# Patient Record
Sex: Female | Born: 1980 | Race: White | Hispanic: No | Smoking: Current every day smoker
Health system: Southern US, Community
[De-identification: ages and names within clinical notes are randomized; demographics above are authoritative.]

## PROBLEM LIST (undated history)

## (undated) DIAGNOSIS — F1010 Alcohol abuse, uncomplicated: Secondary | ICD-10-CM

## (undated) DIAGNOSIS — F32A Depression, unspecified: Secondary | ICD-10-CM

## (undated) DIAGNOSIS — D649 Anemia, unspecified: Secondary | ICD-10-CM

## (undated) DIAGNOSIS — D696 Thrombocytopenia, unspecified: Secondary | ICD-10-CM

## (undated) DIAGNOSIS — J40 Bronchitis, not specified as acute or chronic: Secondary | ICD-10-CM

## (undated) DIAGNOSIS — F329 Major depressive disorder, single episode, unspecified: Secondary | ICD-10-CM

## (undated) DIAGNOSIS — G43909 Migraine, unspecified, not intractable, without status migrainosus: Secondary | ICD-10-CM

## (undated) DIAGNOSIS — Z87442 Personal history of urinary calculi: Secondary | ICD-10-CM

## (undated) HISTORY — PX: CHOLECYSTECTOMY: SHX55

## (undated) HISTORY — PX: APPENDECTOMY: SHX54

## (undated) HISTORY — PX: TUBAL LIGATION: SHX77

---

## 2004-07-28 ENCOUNTER — Emergency Department: Payer: Self-pay | Admitting: Internal Medicine

## 2004-08-19 ENCOUNTER — Emergency Department: Payer: Self-pay | Admitting: Emergency Medicine

## 2005-10-24 ENCOUNTER — Emergency Department: Payer: Self-pay | Admitting: Emergency Medicine

## 2006-06-16 ENCOUNTER — Emergency Department: Payer: Self-pay | Admitting: Emergency Medicine

## 2006-06-16 ENCOUNTER — Other Ambulatory Visit: Payer: Self-pay

## 2007-03-12 ENCOUNTER — Emergency Department: Payer: Self-pay | Admitting: Emergency Medicine

## 2007-11-13 ENCOUNTER — Emergency Department: Payer: Self-pay | Admitting: Emergency Medicine

## 2007-11-27 ENCOUNTER — Observation Stay: Payer: Self-pay | Admitting: Urology

## 2008-04-28 ENCOUNTER — Emergency Department: Payer: Self-pay | Admitting: Unknown Physician Specialty

## 2008-06-28 ENCOUNTER — Emergency Department: Payer: Self-pay

## 2008-07-06 ENCOUNTER — Emergency Department: Payer: Self-pay | Admitting: Emergency Medicine

## 2008-07-12 ENCOUNTER — Emergency Department: Payer: Self-pay | Admitting: Emergency Medicine

## 2008-07-14 ENCOUNTER — Emergency Department: Payer: Self-pay | Admitting: Emergency Medicine

## 2008-09-21 ENCOUNTER — Emergency Department: Payer: Self-pay | Admitting: Emergency Medicine

## 2008-12-16 ENCOUNTER — Emergency Department: Payer: Self-pay | Admitting: Emergency Medicine

## 2009-01-27 ENCOUNTER — Emergency Department: Payer: Self-pay | Admitting: Unknown Physician Specialty

## 2009-02-01 ENCOUNTER — Emergency Department: Payer: Self-pay | Admitting: Emergency Medicine

## 2009-05-03 ENCOUNTER — Emergency Department: Payer: Self-pay | Admitting: Emergency Medicine

## 2009-07-24 ENCOUNTER — Emergency Department: Payer: Self-pay | Admitting: Emergency Medicine

## 2009-11-01 ENCOUNTER — Emergency Department: Payer: Self-pay | Admitting: Emergency Medicine

## 2010-02-10 ENCOUNTER — Emergency Department: Payer: Self-pay | Admitting: Emergency Medicine

## 2010-07-17 ENCOUNTER — Emergency Department: Payer: Self-pay | Admitting: Emergency Medicine

## 2010-07-30 ENCOUNTER — Emergency Department: Payer: Self-pay | Admitting: Emergency Medicine

## 2010-09-10 ENCOUNTER — Emergency Department: Payer: Self-pay | Admitting: Emergency Medicine

## 2010-11-15 ENCOUNTER — Emergency Department: Payer: Self-pay | Admitting: Emergency Medicine

## 2011-03-20 ENCOUNTER — Emergency Department: Payer: Self-pay | Admitting: Unknown Physician Specialty

## 2011-09-21 ENCOUNTER — Emergency Department: Payer: Self-pay | Admitting: *Deleted

## 2012-01-18 ENCOUNTER — Emergency Department: Payer: Self-pay | Admitting: *Deleted

## 2012-01-24 ENCOUNTER — Emergency Department: Payer: Self-pay | Admitting: Unknown Physician Specialty

## 2012-04-29 ENCOUNTER — Emergency Department: Payer: Self-pay | Admitting: Emergency Medicine

## 2012-10-29 ENCOUNTER — Emergency Department: Payer: Self-pay | Admitting: Emergency Medicine

## 2013-01-03 ENCOUNTER — Emergency Department: Payer: Self-pay | Admitting: Emergency Medicine

## 2013-01-03 LAB — URINALYSIS, COMPLETE
Bilirubin,UR: NEGATIVE
Blood: NEGATIVE
Glucose,UR: NEGATIVE mg/dL (ref 0–75)
Ketone: NEGATIVE
Ph: 6 (ref 4.5–8.0)
Protein: NEGATIVE
RBC,UR: 2 /HPF (ref 0–5)
Specific Gravity: 1.024 (ref 1.003–1.030)
Squamous Epithelial: 2

## 2013-09-07 ENCOUNTER — Emergency Department: Payer: Self-pay

## 2015-07-23 ENCOUNTER — Emergency Department
Admission: EM | Admit: 2015-07-23 | Discharge: 2015-07-23 | Disposition: A | Discharge status: Home / Self Care | Payer: Self-pay | Attending: Emergency Medicine | Admitting: Emergency Medicine

## 2015-07-23 ENCOUNTER — Encounter: Payer: Self-pay | Admitting: Emergency Medicine

## 2015-07-23 DIAGNOSIS — Z720 Tobacco use: Secondary | ICD-10-CM | POA: Insufficient documentation

## 2015-07-23 DIAGNOSIS — B9789 Other viral agents as the cause of diseases classified elsewhere: Secondary | ICD-10-CM

## 2015-07-23 DIAGNOSIS — J069 Acute upper respiratory infection, unspecified: Secondary | ICD-10-CM | POA: Insufficient documentation

## 2015-07-23 DIAGNOSIS — J988 Other specified respiratory disorders: Secondary | ICD-10-CM

## 2015-07-23 MED ORDER — IBUPROFEN 800 MG PO TABS: 800.0000 mg | ORAL_TABLET | Freq: Three times a day (TID) | ORAL | Status: DC | PRN

## 2015-07-23 MED ORDER — PROMETHAZINE-PHENYLEPHRINE 6.25-5 MG/5ML PO SYRP: 7.5000 mL | ORAL_SOLUTION | ORAL | Status: DC | PRN

## 2015-07-23 MED ORDER — KETOROLAC TROMETHAMINE 60 MG/2ML IM SOLN
60.0000 mg | Freq: Once | INTRAMUSCULAR | Status: AC
  Administered 2015-07-23: 60 mg via INTRAMUSCULAR
  Filled 2015-07-23: qty 2

## 2015-07-23 MED ORDER — HYDROCOD POLST-CPM POLST ER 10-8 MG/5ML PO SUER
5.0000 mL | Freq: Once | ORAL | Status: AC
  Administered 2015-07-23: 5 mL via ORAL
  Filled 2015-07-23: qty 5

## 2015-07-23 MED ORDER — ONDANSETRON 8 MG PO TBDP
8.0000 mg | ORAL_TABLET | Freq: Once | ORAL | Status: AC
  Administered 2015-07-23: 8 mg via ORAL
  Filled 2015-07-23: qty 1

## 2015-07-23 NOTE — ED Notes (Signed)
States she developed body aches with some fever , n/v and cough states cough is occasionally prod

## 2015-07-23 NOTE — Discharge Instructions (Signed)

## 2015-07-23 NOTE — ED Provider Notes (Signed)
[facility] Emergency Department Provider Note  ____________________________________________  Time seen: Approximately 10:57 AM  I have reviewed the triage vital signs and the nursing notes.   HISTORY  Chief Complaint Cough and Sore Throat    HPI Katrina Page is a 34 y.o. female patient complaining of body ache and fever along with nausea and vomiting. Patient states  intermittent productive nonproductive cough.Patient denies any diarrhea. Patient also complaining of a scratchy sore throat. Patient state increased cough with deep inspirations. No palliative measures taken for this complaint. Patient rates her pain discomfort as a 10 over 10.   History reviewed. No pertinent past medical history.  There are no active problems to display for this patient.   History reviewed. No pertinent past surgical history.  Current Outpatient Rx  Name  Route  Sig  Dispense  Refill  . promethazine-phenylephrine (PROMETHAZINE VC) 6.25-5 MG/5ML SYRP   Oral   Take 7.5 mLs by mouth every 4 (four) hours as needed for congestion.   118 mL   0     Allergies Review of patient's allergies indicates no known allergies.  History reviewed. No pertinent family history.  Social History Social History  Substance Use Topics  . Smoking status: Current Every Day Smoker  . Smokeless tobacco: None  . Alcohol Use: No    Review of Systems Constitutional: Fever  Eyes: No visual changes. ENT: Sore throat: Cardiovascular: Denies chest pain. Respiratory: Denies shortness of breath. Intermittently productive nonproductive cough which increases with deep inspirations. Gastrointestinal: No abdominal pain.  Nausea and vomiting.  No diarrhea.  No constipation. Genitourinary: Negative for dysuria. Musculoskeletal: Negative for back pain. Skin: Negative for rash. Neurological: Negative for headaches, focal weakness or numbness. 10-point ROS otherwise  negative.  ____________________________________________   PHYSICAL EXAM:  VITAL SIGNS: ED Triage Vitals  Enc Vitals Group     BP 07/23/15 1002 138/78 mmHg     Pulse Rate 07/23/15 1002 75     Resp 07/23/15 1002 18     Temp 07/23/15 1002 98.3 F (36.8 C)     Temp Source 07/23/15 1002 Oral     SpO2 07/23/15 1002 97 %     Weight 07/23/15 0953 240 lb (108.863 kg)     Height 07/23/15 0953 5\' 9"  (1.753 m)     Head Cir --      Peak Flow --      Pain Score 07/23/15 0953 10     Pain Loc --      Pain Edu? --      Excl. in [place]? --     Constitutional: Alert and oriented. Well appearing and in no acute distress. Eyes: Conjunctivae are normal. PERRL. EOMI. Head: Atraumatic. Nose: Edematous nasal turbinates with clear rhinorrhea. Mouth/Throat: Mucous membranes are moist.  Oropharynx erythematous. Nonedematous or erythematous tonsils. Neck: No stridor.  No cervical spine tenderness to palpation. Hematological/Lymphatic/Immunilogical: No cervical lymphadenopathy. Cardiovascular: Normal rate, regular rhythm. Grossly normal heart sounds.  Good peripheral circulation. Respiratory: Normal respiratory effort.  No retractions. Lungs CTAB. Nonproductive cough Gastrointestinal: Soft and nontender. No distention. No abdominal bruits. No CVA tenderness. Normoactive bowel sounds Musculoskeletal: No lower extremity tenderness nor edema.  No joint effusions. Neurologic:  Normal speech and language. No gross focal neurologic deficits are appreciated. No gait instability. Skin:  Skin is warm, dry and intact. No rash noted. Psychiatric: Mood and affect are normal. Speech and behavior are normal.  ____________________________________________   LABS (all labs ordered are listed, but only abnormal  results are displayed)  Labs Reviewed - No data to  display ____________________________________________  EKG   ____________________________________________  RADIOLOGY   ____________________________________________   PROCEDURES  Procedure(s) performed: None  Critical Care performed: No  ____________________________________________   INITIAL IMPRESSION / ASSESSMENT AND PLAN / ED COURSE  Pertinent labs & imaging results that were available during my care of the patient were reviewed by me and considered in my medical decision making (see chart for details).  Viral respiratory illness. Patient given prescription for Bromfed-DM and ibuprofen. ____________________________________________   FINAL CLINICAL IMPRESSION(S) / ED DIAGNOSES  Final diagnoses:  Viral respiratory illness      Sable Feil, PA-C 07/23/15 [place], MD 07/24/15 2200

## 2015-07-23 NOTE — ED Notes (Signed)
Pt to ed with c/o cough, congestion, sore throat, body aches x 4 days.

## 2015-07-23 NOTE — ED Notes (Signed)
AAOx3.  Skin warm and dry.  Ambulates with easy and steady gait. NAD 

## 2015-10-02 ENCOUNTER — Encounter: Payer: Self-pay | Admitting: Emergency Medicine

## 2015-10-02 ENCOUNTER — Emergency Department
Admission: EM | Admit: 2015-10-02 | Discharge: 2015-10-02 | Disposition: A | Discharge status: Home / Self Care | Payer: BLUE CROSS/BLUE SHIELD | Attending: Emergency Medicine | Admitting: Emergency Medicine

## 2015-10-02 DIAGNOSIS — R51 Headache: Secondary | ICD-10-CM | POA: Diagnosis present

## 2015-10-02 DIAGNOSIS — J069 Acute upper respiratory infection, unspecified: Secondary | ICD-10-CM

## 2015-10-02 DIAGNOSIS — F17200 Nicotine dependence, unspecified, uncomplicated: Secondary | ICD-10-CM | POA: Insufficient documentation

## 2015-10-02 LAB — RAPID INFLUENZA A&B ANTIGENS
Influenza A (ARMC): NOT DETECTED
Influenza B (ARMC): NOT DETECTED

## 2015-10-02 MED ORDER — BENZONATATE 100 MG PO CAPS: 100.0000 mg | ORAL_CAPSULE | Freq: Three times a day (TID) | ORAL | Status: DC | PRN

## 2015-10-02 MED ORDER — LIDOCAINE VISCOUS 2 % MT SOLN: 20.0000 mL | OROMUCOSAL | Status: DC | PRN

## 2015-10-02 NOTE — ED Provider Notes (Signed)
CSN: [id number]     Arrival date & time 10/02/15  1030 History   First MD Initiated Contact with Patient 10/02/15 1104     Chief Complaint  Patient presents with  . Influenza      HPI Comments: 35 year old female presents today complaining of body aches, headache and sore throat for the past 3 days. Has also had a cough associated with her symptoms. Subjective fevers. Taking nyquil without relief. Continues to smoke 1/2 ppd of cigarettes. No known sick contacts.   The history is provided by the patient.    History reviewed. No pertinent past medical history. History reviewed. No pertinent past surgical history. No family history on file. Social History  Substance Use Topics  . Smoking status: Current Every Day Smoker  . Smokeless tobacco: None  . Alcohol Use: No   OB History    No data available     Review of Systems  Constitutional: Positive for fever. Negative for chills.  HENT: Positive for congestion.   Respiratory: Positive for cough. Negative for shortness of breath.   Gastrointestinal: Negative for nausea and vomiting.  Skin: Negative for rash.  All other systems reviewed and are negative.     Allergies  Review of patient's allergies indicates no known allergies.  Home Medications   Prior to Admission medications   Medication Sig Start Date End Date Taking? Authorizing Provider  benzonatate (TESSALON PERLES) 100 MG capsule Take 1 capsule (100 mg total) by mouth 3 (three) times daily as needed for cough. 10/02/15 10/01/16  Harvest Dark, PA-C  lidocaine (XYLOCAINE) 2 % solution Use as directed 20 mLs in the mouth or throat as needed for mouth pain. 10/02/15   Angelica Ran V, PA-C   BP 139/83 mmHg  Pulse 80  Temp(Src) 98.2 F (36.8 C) (Oral)  Resp 20  Ht 5\' 6"  (1.676 m)  Wt 113.399 kg  BMI 40.37 kg/m2  SpO2 98%  LMP 09/06/2015 (Approximate) Physical Exam  Constitutional: She is oriented to person, place, and time. Vital signs are normal. She appears  well-developed and well-nourished. She is active.  Non-toxic appearance. She does not have a sickly appearance. She does not appear ill.  HENT:  Head: Normocephalic and atraumatic.  Right Ear: Tympanic membrane and external ear normal.  Left Ear: Tympanic membrane and external ear normal.  Nose: Nose normal.  Mouth/Throat: Uvula is midline, oropharynx is clear and moist and mucous membranes are normal.  Eyes: Conjunctivae and EOM are normal. Pupils are equal, round, and reactive to light.  Neck: Normal range of motion. Neck supple.  Cardiovascular: Normal rate, regular rhythm, normal heart sounds and intact distal pulses.  Exam reveals no gallop and no friction rub.   No murmur heard. Pulmonary/Chest: Effort normal and breath sounds normal. No respiratory distress. She has no wheezes. She has no rales.  Lymphadenopathy:    She has no cervical adenopathy.  Neurological: She is alert and oriented to person, place, and time.  Skin: Skin is warm and dry. No rash noted.  Psychiatric: She has a normal mood and affect. Her behavior is normal. Judgment and thought content normal.  Nursing note and vitals reviewed.   ED Course  Procedures (including critical care time) Labs Review Labs Reviewed  RAPID INFLUENZA A&B ANTIGENS ([place])    Imaging Review No results found. I have personally reviewed and evaluated these images and lab results as part of my medical decision-making.   EKG Interpretation None  MDM  Negative flu swab Supportive care, push fluids, mucinex DM. RX for tessalon and viscous lidocaine Follow up with PCP for persistent symptoms return here if worsening  Final diagnoses:  Viral URI        Harvest Dark, PA-C 10/02/15 [place], MD 10/02/15 1531

## 2015-10-02 NOTE — Discharge Instructions (Signed)
Upper Respiratory Infection, Adult Most upper respiratory infections (URIs) are a viral infection of the air passages leading to the lungs. A URI affects the nose, throat, and upper air passages. The most common type of URI is nasopharyngitis and is typically referred to as "the common cold." URIs run their course and usually go away on their own. Most of the time, a URI does not require medical attention, but sometimes a bacterial infection in the upper airways can follow a viral infection. This is called a secondary infection. Sinus and middle ear infections are common types of secondary upper respiratory infections. Bacterial pneumonia can also complicate a URI. A URI can worsen asthma and chronic obstructive pulmonary disease (COPD). Sometimes, these complications can require emergency medical care and may be life threatening.  CAUSES Almost all URIs are caused by viruses. A virus is a type of germ and can spread from one person to another.  RISKS FACTORS You may be at risk for a URI if:   You smoke.   You have chronic heart or lung disease.  You have a weakened defense (immune) system.   You are very young or very old.   You have nasal allergies or asthma.  You work in crowded or poorly ventilated areas.  You work in health care facilities or schools. SIGNS AND SYMPTOMS  Symptoms typically develop 2-3 days after you come in contact with a cold virus. Most viral URIs last 7-10 days. However, viral URIs from the influenza virus (flu virus) can last 14-18 days and are typically more severe. Symptoms may include:   Runny or stuffy (congested) nose.   Sneezing.   Cough.   Sore throat.   Headache.   Fatigue.   Fever.   Loss of appetite.   Pain in your forehead, behind your eyes, and over your cheekbones (sinus pain).  Muscle aches.  DIAGNOSIS  Your health care provider may diagnose a URI by:  Physical exam.  Tests to check that your symptoms are not due to  another condition such as:  Strep throat.  Sinusitis.  Pneumonia.  Asthma. TREATMENT  A URI goes away on its own with time. It cannot be cured with medicines, but medicines may be prescribed or recommended to relieve symptoms. Medicines may help:  Reduce your fever.  Reduce your cough.  Relieve nasal congestion. HOME CARE INSTRUCTIONS   Take medicines only as directed by your health care provider.   Gargle warm saltwater or take cough drops to comfort your throat as directed by your health care provider.  Use a warm mist humidifier or inhale steam from a shower to increase air moisture. This may make it easier to breathe.  Drink enough fluid to keep your urine clear or pale yellow.   Eat soups and other clear broths and maintain good nutrition.   Rest as needed.   Return to work when your temperature has returned to normal or as your health care provider advises. You may need to stay home longer to avoid infecting others. You can also use a face mask and careful hand washing to prevent spread of the virus.  Increase the usage of your inhaler if you have asthma.   Do not use any tobacco products, including cigarettes, chewing tobacco, or electronic cigarettes. If you need help quitting, ask your health care provider. PREVENTION  The best way to protect yourself from getting a cold is to practice good hygiene.   Avoid oral or hand contact with people with cold   symptoms.   Wash your hands often if contact occurs.  There is no clear evidence that vitamin C, vitamin E, echinacea, or exercise reduces the chance of developing a cold. However, it is always recommended to get plenty of rest, exercise, and practice good nutrition.  SEEK MEDICAL CARE IF:   You are getting worse rather than better.   Your symptoms are not controlled by medicine.   You have chills.  You have worsening shortness of breath.  You have brown or red mucus.  You have yellow or brown nasal  discharge.  You have pain in your face, especially when you bend forward.  You have a fever.  You have swollen neck glands.  You have pain while swallowing.  You have white areas in the back of your throat. SEEK IMMEDIATE MEDICAL CARE IF:   You have severe or persistent:  Headache.  Ear pain.  Sinus pain.  Chest pain.  You have chronic lung disease and any of the following:  Wheezing.  Prolonged cough.  Coughing up blood.  A change in your usual mucus.  You have a stiff neck.  You have changes in your:  Vision.  Hearing.  Thinking.  Mood. MAKE SURE YOU:   Understand these instructions.  Will watch your condition.  Will get help right away if you are not doing well or get worse.   This information is not intended to replace advice given to you by your health care provider. Make sure you discuss any questions you have with your health care provider.   Document Released: 03/04/2001 Document Revised: 01/23/2015 Document Reviewed: 12/14/2013 Elsevier Interactive Patient Education 2016 [employer]  

## 2015-10-02 NOTE — ED Notes (Signed)
Body aches   Sore throat for 3 days  Unsure of fever positive headache

## 2015-10-02 NOTE — ED Notes (Signed)
Flu sx's   Body aches   Headache and sore throat for about 3 days

## 2016-04-03 ENCOUNTER — Encounter: Payer: Self-pay | Admitting: Emergency Medicine

## 2016-04-03 ENCOUNTER — Emergency Department
Admission: EM | Admit: 2016-04-03 | Discharge: 2016-04-03 | Disposition: A | Discharge status: Home / Self Care | Payer: BLUE CROSS/BLUE SHIELD | Attending: Emergency Medicine | Admitting: Emergency Medicine

## 2016-04-03 ENCOUNTER — Emergency Department: Payer: BLUE CROSS/BLUE SHIELD

## 2016-04-03 DIAGNOSIS — R197 Diarrhea, unspecified: Secondary | ICD-10-CM

## 2016-04-03 DIAGNOSIS — F17200 Nicotine dependence, unspecified, uncomplicated: Secondary | ICD-10-CM | POA: Insufficient documentation

## 2016-04-03 DIAGNOSIS — R109 Unspecified abdominal pain: Secondary | ICD-10-CM

## 2016-04-03 DIAGNOSIS — K529 Noninfective gastroenteritis and colitis, unspecified: Secondary | ICD-10-CM

## 2016-04-03 DIAGNOSIS — R1030 Lower abdominal pain, unspecified: Secondary | ICD-10-CM | POA: Diagnosis present

## 2016-04-03 DIAGNOSIS — R112 Nausea with vomiting, unspecified: Secondary | ICD-10-CM

## 2016-04-03 LAB — CBC
HEMATOCRIT: 43.3 % (ref 35.0–47.0)
Hemoglobin: 14 g/dL (ref 12.0–16.0)
MCH: 27.2 pg (ref 26.0–34.0)
MCHC: 32.2 g/dL (ref 32.0–36.0)
MCV: 84.3 fL (ref 80.0–100.0)
Platelets: 271 10*3/uL (ref 150–440)
RBC: 5.14 MIL/uL (ref 3.80–5.20)
RDW: 18.7 % — ABNORMAL HIGH (ref 11.5–14.5)
WBC: 10.5 10*3/uL (ref 3.6–11.0)

## 2016-04-03 LAB — COMPREHENSIVE METABOLIC PANEL
ALBUMIN: 4.3 g/dL (ref 3.5–5.0)
ALK PHOS: 61 U/L (ref 38–126)
ALT: 18 U/L (ref 14–54)
AST: 28 U/L (ref 15–41)
Anion gap: 11 (ref 5–15)
BILIRUBIN TOTAL: 0.4 mg/dL (ref 0.3–1.2)
BUN: 8 mg/dL (ref 6–20)
CALCIUM: 9.2 mg/dL (ref 8.9–10.3)
CO2: 24 mmol/L (ref 22–32)
CREATININE: 1.05 mg/dL — AB (ref 0.44–1.00)
Chloride: 106 mmol/L (ref 101–111)
GFR calc Af Amer: >60 mL/min (ref >60)
GFR calc non Af Amer: >60 mL/min (ref >60)
GLUCOSE: 141 mg/dL — AB (ref 65–99)
Potassium: 3.5 mmol/L (ref 3.5–5.1)
SODIUM: 141 mmol/L (ref 135–145)
TOTAL PROTEIN: 8 g/dL (ref 6.5–8.1)

## 2016-04-03 LAB — URINALYSIS COMPLETE WITH MICROSCOPIC (ARMC ONLY)
BACTERIA UA: NONE SEEN
Bilirubin Urine: NEGATIVE
Glucose, UA: NEGATIVE mg/dL
Hgb urine dipstick: NEGATIVE
Ketones, ur: NEGATIVE mg/dL
NITRITE: NEGATIVE
PROTEIN: 30 mg/dL — AB
SPECIFIC GRAVITY, URINE: 1.016 (ref 1.005–1.030)
pH: 7 (ref 5.0–8.0)

## 2016-04-03 LAB — POCT PREGNANCY, URINE: PREG TEST UR: NEGATIVE

## 2016-04-03 LAB — LIPASE, BLOOD: Lipase: 29 U/L (ref 11–51)

## 2016-04-03 MED ORDER — SODIUM CHLORIDE 0.9 % IV BOLUS (SEPSIS)
500.0000 mL | Freq: Once | INTRAVENOUS | Status: AC
  Administered 2016-04-03: 500 mL via INTRAVENOUS

## 2016-04-03 MED ORDER — ONDANSETRON HCL 4 MG/2ML IJ SOLN
4.0000 mg | Freq: Once | INTRAMUSCULAR | Status: AC | PRN
  Administered 2016-04-03: 4 mg via INTRAVENOUS
  Filled 2016-04-03: qty 2

## 2016-04-03 MED ORDER — ONDANSETRON HCL 4 MG PO TABS: 4.0000 mg | ORAL_TABLET | Freq: Three times a day (TID) | ORAL | Status: AC | PRN

## 2016-04-03 NOTE — ED Notes (Signed)
Pt presents to ED with reports of nausea, vomiting and diarrhea for three days. Pt reports lower abdominal cramping. Pt states she thinks she might have food poisoning from a burger at [facility].

## 2016-04-03 NOTE — ED Notes (Signed)
See triage note  States she developed some abd cramping with n/v/d  Last time vomited was this am

## 2016-04-03 NOTE — ED Provider Notes (Signed)
[facility] Emergency Department Provider Note  ____________________________________________  Time seen: Approximately 12:06 PM  I have reviewed the triage vital signs and the nursing notes.   HISTORY  Chief Complaint Emesis and Abdominal Pain    HPI Katrina Page is a 35 y.o. female , NAD, presents to the emergency department with three-day history of nausea, vomiting, diarrhea and lower abdominal cramping. States that she and her children ate at [facility] the afternoon prior to onset of symptoms. States everyone in the home that 8 at the same facility all had sudden onset of nausea, vomiting, diarrhea. Patient states that her family members are improving but unfortunately she has not. Has been taking over-the-counter Pepto-Bismol throughout the last 3 days and has noted that the diarrhea has resolved. States that she has lower abdominal cramping which somewhat improves with emesis. Has not noted any blood in her stool. No coffee ground emesis or hematemesis. Denies fevers, chills, body aches but has had some fatigue. Has been attempting to intake fluids by mouth but that increases her abdominal pain and nausea.   History reviewed. No pertinent past medical history.  There are no active problems to display for this patient.   History reviewed. No pertinent past surgical history.  Current Outpatient Rx  Name  Route  Sig  Dispense  Refill  . ondansetron (ZOFRAN) 4 MG tablet   Oral   Take 1 tablet (4 mg total) by mouth every 8 (eight) hours as needed for nausea or vomiting.   21 tablet   0     Allergies Review of patient's allergies indicates no known allergies.  No family history on file.  Social History Social History  Substance Use Topics  . Smoking status: Current Every Day Smoker  . Smokeless tobacco: None  . Alcohol Use: No     Review of Systems  Constitutional: Positive fatigue. No fever/chills and diaphoresis Eyes: No visual changes.   Cardiovascular: No chest pain, palpitations. Respiratory: No cough. No shortness of breath. No wheezing.  Gastrointestinal: Positive intermittent lower abdominal pain.  Positive nausea, vomiting, diarrhea.  No hematochezia, coffee-ground emesis, constipation. Genitourinary: Negative for dysuria, hematuria. No urinary hesitancy, urgency or increased frequency. Musculoskeletal: Negative for back, neck pain.  Skin: Negative for rash, skin sores. Neurological: Negative for headaches, focal weakness or numbness. No tingling. 10-point ROS otherwise negative.  ____________________________________________   PHYSICAL EXAM:  VITAL SIGNS: ED Triage Vitals  Enc Vitals Group     BP 04/03/16 1125 162/95 mmHg     Pulse Rate 04/03/16 1125 91     Resp 04/03/16 1125 18     Temp 04/03/16 1125 98.3 F (36.8 C)     Temp Source 04/03/16 1125 Oral     SpO2 04/03/16 1125 98 %     Weight 04/03/16 1125 250 lb (113.399 kg)     Height 04/03/16 1125 5\' 8"  (1.727 m)     Head Cir --      Peak Flow --      Pain Score 04/03/16 1126 8     Pain Loc --      Pain Edu? --      Excl. in [place]? --      Constitutional: Alert and oriented. Ill appearing but in no acute distress. Eyes: Conjunctivae are normal.  Head: Atraumatic. ENT:      Mouth/Throat: Mucous membranes are moist.  Neck: Supple with full range of motion Hematological/Lymphatic/Immunilogical: No cervical lymphadenopathy. Cardiovascular: Normal rate, regular rhythm. Normal S1 and  S2. No murmurs, rubs, gallops. Good peripheral circulation with 2+ pulses noted in bilateral upper extremities. Respiratory: Normal respiratory effort without tachypnea or retractions. Lungs CTAB with breath sounds noted in all lung fields. No wheeze, rhonchi, rales. Gastrointestinal: Diffuse abdominal tenderness to light and deep palpation but no distention, guarding or rigidity. No rebound tenderness.  No CVA tenderness. Musculoskeletal: No lower extremity tenderness nor  edema.  No joint effusions. Neurologic:  Normal speech and language. No gross focal neurologic deficits are appreciated. Gait and posture are normal Skin:  Skin turgor is normal. Skin is warm, dry and intact. No rash noted. Psychiatric: Mood and affect are normal. Speech and behavior are normal. Patient exhibits appropriate insight and judgement.   ____________________________________________   LABS (all labs ordered are listed, but only abnormal results are displayed)  Labs Reviewed  COMPREHENSIVE METABOLIC PANEL - Abnormal; Notable for the following:    Glucose, Bld 141 (*)    Creatinine, Ser 1.05 (*)    All other components within normal limits  CBC - Abnormal; Notable for the following:    RDW 18.7 (*)    All other components within normal limits  URINALYSIS COMPLETEWITH MICROSCOPIC ([facility] ONLY) - Abnormal; Notable for the following:    Color, Urine YELLOW (*)    APPearance CLEAR (*)    Protein, ur 30 (*)    Leukocytes, UA TRACE (*)    Squamous Epithelial / LPF 0-5 (*)    All other components within normal limits  LIPASE, BLOOD  POC URINE PREG, ED  POCT PREGNANCY, URINE   ____________________________________________  EKG  None ____________________________________________  RADIOLOGY I have personally viewed and evaluated these images (plain radiographs) as part of my medical decision making, as well as reviewing the written report by the radiologist.  Dg Abd Acute W/chest  04/03/2016  CLINICAL DATA:  Abdominal pain for 3 days EXAM: DG ABDOMEN ACUTE W/ 1V CHEST COMPARISON:  07/30/2010, 11/27/2007 FINDINGS: Cardiac shadow is within normal limits. The lungs are clear bilaterally. No acute bony abnormality is seen. Scattered large and small bowel gas is seen. No free air is noted. 12 mm somewhat triangular density is noted in the expected region of the left renal pelvis similar to that seen on prior CT examination. Changes of prior cholecystectomy are seen. No obstructive  changes are noted. The bony structures are within normal limits. IMPRESSION: Stable left renal pelvic stone. No other focal abnormality is noted. Electronically Signed   By: Inez Catalina M.D.   On: 04/03/2016 14:25    ____________________________________________    PROCEDURES  Procedure(s) performed: None   Medications  ondansetron (ZOFRAN) injection 4 mg (4 mg Intravenous Given 04/03/16 1204)  sodium chloride 0.9 % bolus 500 mL (0 mLs Intravenous Stopped 04/03/16 1329)   ----------------------------------------- 1:00 PM on 04/03/2016 ----------------------------------------- Patient notes that nausea has decreased but continues to have some lower abdominal cramping. Has not had any incidents of vomiting or diarrhea since being in the emergency department.  ----------------------------------------- 1:36 PM on 04/03/2016 -----------------------------------------  Patient was able to give a urine sample in which we will send for urinalysis. She has had approximately 500 mL of IV fluid and has taken a few sips of water at the bedside but states that when she drinks more water causes more lower abdominal cramping. Again has not had any episodes of diarrhea or vomiting since being in the emergency department. Lab work at this time is reassuring as there is no evidence of electrolyte imbalance or infection at this  time. We will go ahead and complete an acute abdominal radiographic series for further evaluation.   ____________________________________________   INITIAL IMPRESSION / ASSESSMENT AND PLAN / ED COURSE  Pertinent labs & imaging results that were available during my care of the patient were reviewed by me and considered in my medical decision making (see chart for details). Patient was made aware of all lab results and imaging results that were available during her visit. Patient made aware of elevated glucose as well as slightly elevated creatinine and proteinuria. Patient has been  advised to establish care with [facility] clinic in order to have close follow-up of abdominal pain and gastroenteritis as well as the abnormal lab values. At the time of discharge patient noted that nausea had ceased and she has had improved and lessened abdominal pain.  Patient's diagnosis is consistent with non-intractable vomiting with nausea, diarrhea and abdominal pain due to acute gastroenteritis. Patient will be discharged home with prescriptions for Zofran to take as directed. Patient is encouraged to discontinue use of Pepto-Bismol. Patient is to follow up with [facility] if symptoms persist past this treatment course. Patient is given strict ED precautions to return to the ED for any worsening or new symptoms.    ____________________________________________  FINAL CLINICAL IMPRESSION(S) / ED DIAGNOSES  Final diagnoses:  Non-intractable vomiting with nausea, unspecified vomiting type  Diarrhea, unspecified type  Abdominal pain, unspecified abdominal location  Acute gastroenteritis      NEW MEDICATIONS STARTED DURING THIS VISIT:  New Prescriptions   ONDANSETRON (ZOFRAN) 4 MG TABLET    Take 1 tablet (4 mg total) by mouth every 8 (eight) hours as needed for nausea or vomiting.         Braxton Feathers, PA-C 04/03/16 [place], MD 04/03/16 [phone]

## 2016-04-03 NOTE — Discharge Instructions (Signed)
Nausea and Vomiting Nausea is a sick feeling that often comes before throwing up (vomiting). Vomiting is a reflex where stomach contents come out of your mouth. Vomiting can cause severe loss of body fluids (dehydration). Children and elderly adults can become dehydrated quickly, especially if they also have diarrhea. Nausea and vomiting are symptoms of a condition or disease. It is important to find the cause of your symptoms. CAUSES   Direct irritation of the stomach lining. This irritation can result from increased acid production (gastroesophageal reflux disease), infection, food poisoning, taking certain medicines (such as nonsteroidal anti-inflammatory drugs), alcohol use, or tobacco use.  Signals from the brain.These signals could be caused by a headache, heat exposure, an inner ear disturbance, increased pressure in the brain from injury, infection, a tumor, or a concussion, pain, emotional stimulus, or metabolic problems.  An obstruction in the gastrointestinal tract (bowel obstruction).  Illnesses such as diabetes, hepatitis, gallbladder problems, appendicitis, kidney problems, cancer, sepsis, atypical symptoms of a heart attack, or eating disorders.  Medical treatments such as chemotherapy and radiation.  Receiving medicine that makes you sleep (general anesthetic) during surgery. DIAGNOSIS Your caregiver may ask for tests to be done if the problems do not improve after a few days. Tests may also be done if symptoms are severe or if the reason for the nausea and vomiting is not clear. Tests may include:  Urine tests.  Blood tests.  Stool tests.  Cultures (to look for evidence of infection).  X-rays or other imaging studies. Test results can help your caregiver make decisions about treatment or the need for additional tests. TREATMENT You need to stay well hydrated. Drink frequently but in small amounts.You may wish to drink water, sports drinks, clear broth, or eat frozen  ice pops or gelatin dessert to help stay hydrated.When you eat, eating slowly may help prevent nausea.There are also some antinausea medicines that may help prevent nausea. HOME CARE INSTRUCTIONS   Take all medicine as directed by your caregiver.  If you do not have an appetite, do not force yourself to eat. However, you must continue to drink fluids.  If you have an appetite, eat a normal diet unless your caregiver tells you differently.  Eat a variety of complex carbohydrates (rice, wheat, potatoes, bread), lean meats, yogurt, fruits, and vegetables.  Avoid high-fat foods because they are more difficult to digest.  Drink enough water and fluids to keep your urine clear or pale yellow.  If you are dehydrated, ask your caregiver for specific rehydration instructions. Signs of dehydration may include:  Severe thirst.  Dry lips and mouth.  Dizziness.  Dark urine.  Decreasing urine frequency and amount.  Confusion.  Rapid breathing or pulse. SEEK IMMEDIATE MEDICAL CARE IF:   You have blood or brown flecks (like coffee grounds) in your vomit.  You have black or bloody stools.  You have a severe headache or stiff neck.  You are confused.  You have severe abdominal pain.  You have chest pain or trouble breathing.  You do not urinate at least once every 8 hours.  You develop cold or clammy skin.  You continue to vomit for longer than 24 to 48 hours.  You have a fever. MAKE SURE YOU:   Understand these instructions.  Will watch your condition.  Will get help right away if you are not doing well or get worse.   This information is not intended to replace advice given to you by your health care provider. Make sure  you discuss any questions you have with your health care provider.   Document Released: 09/08/2005 Document Revised: 12/01/2011 Document Reviewed: 02/05/2011 Elsevier Interactive Patient Education 2016 Elsevier Inc[employer]  Probiotics WHAT ARE  PROBIOTICS? Probiotics are the good bacteria and yeasts that live in your body and keep you and your digestive system healthy. Probiotics also help your body's defense (immune) system and protect your body against bad bacterial growth.  Certain foods contain probiotics, such as yogurt. Probiotics can also be purchased as a supplement. As with any supplement or drug, it is important to discuss its use with your health care provider.  WHAT AFFECTS THE BALANCE OF BACTERIA IN MY BODY? The balance of bacteria in your body can be affected by:   Antibiotic medicines. Antibiotics are sometimes necessary to treat infection. Unfortunately, they may kill good or friendly bacteria in your body as well as the bad bacteria. This may lead to stomach problems like diarrhea, gas, and cramping.  Disease. Some conditions are the result of an overgrowth of bad bacteria, yeasts, parasites, or fungi. These conditions include:   Infectious diarrhea.  Stomach and respiratory infections.  Skin infections.  Irritable bowel syndrome (IBS).  Inflammatory bowel diseases.  Ulcer due to Helicobacter pylori (H. pylori) infection.  Tooth decay and periodontal disease.  Vaginal infections. Stress and poor diet may also lower the good bacteria in your body.  WHAT TYPE OF PROBIOTIC IS RIGHT FOR ME? Probiotics are available over the counter at your local pharmacy, health food, or grocery store. They come in many different forms, combinations of strains, and dosing strengths. Some may need to be refrigerated. Always read the label for storage and usage instructions. Specific strains have been shown to be more effective for certain conditions. Ask your health care provider what option is best for you.  WHY WOULD I NEED PROBIOTICS? There are many reasons your health care provider might recommend a probiotic supplement, including:   Diarrhea.  Constipation.  IBS.  Respiratory infections.  Yeast infections.  Acne,  eczema, and other skin conditions.  Frequent urinary tract infections (UTIs). ARE THERE SIDE EFFECTS OF PROBIOTICS? Some people experience mild side effects when taking probiotics. Side effects are usually temporary and may include:   Gas.  Bloating.  Cramping. Rarely, serious side effects, such as infection or immune system changes, may occur. WHAT ELSE DO I NEED TO KNOW ABOUT PROBIOTICS?   There are many different strains of probiotics. Certain strains may be more effective depending on your condition. Probiotics are available in varying doses. Ask your health care provider which probiotic you should use and how often.   If you are taking probiotics along with antibiotics, it is generally recommended to wait at least 2 hours between taking the antibiotic and taking the probiotic.  FOR MORE INFORMATION:  [facility] [email or web address]   This information is not intended to replace advice given to you by your health care provider. Make sure you discuss any questions you have with your health care provider.   Document Released: 04/05/2014 Document Reviewed: 04/05/2014 Elsevier Interactive Patient Education 2016 [place] poisoning is an illness caused by something you ate or drank. It usually lasts 1 to 2 days. Problems may be worse for people with low immune systems, the elderly, children and infants, and pregnant women.  HOME CARE  Drink enough water and fluids to keep your pee (urine) clear or pale yellow. Drink small amounts often.  Ask your doctor how to replace body fluid losses (rehydration).  Avoid:  Foods high in sugar.  Alcohol.  Bubbly (carbonated) drinks.  Tobacco.  Juice.  Caffeine drinks.  Very hot or cold fluids.  Fatty, greasy foods.  Eating too much at one time.  Dairy products until 24 to 48 hours after watery poop (diarrhea) stops. You may eat foods with active cultures  (probiotics). They can be found in some yogurts and supplements.  Wash your hands well to avoid spreading the illness.  Only take medicines as told by your doctor. Do not give aspirin to children.  Ask your doctor if you should keep taking your regular medicines. GET HELP RIGHT AWAY IF:  You have trouble breathing, swallowing, talking, or moving.  You have blurry vision.  You cannot keep fluids down.  You pass out (faint) or almost pass out.  Your eyes turn yellow.  You keep throwing up (vomiting) or having watery poop.  Belly (abdominal) pain starts, gets worse, or is just in one small spot (localizes).  You have a fever.  Your watery poop has blood in it.  You feel very weak, dizzy, or thirsty.  You do not pee for 8 hours. MAKE SURE YOU:  Understand these instructions.  Will watch your condition.  Will get help right away if you are not doing well or get worse. This information is not intended to replace advice given to you by your health care provider. Make sure you discuss any questions you have with your health care provider.  Document Released: 02/26/2010 Document Revised: 12/01/2011 Document Reviewed: 03/12/2015  Elsevier Interactive Patient Education [employer].

## 2016-12-08 ENCOUNTER — Emergency Department
Admission: EM | Admit: 2016-12-08 | Discharge: 2016-12-08 | Disposition: A | Discharge status: Home / Self Care | Payer: Self-pay | Attending: Emergency Medicine | Admitting: Emergency Medicine

## 2016-12-08 ENCOUNTER — Encounter: Payer: Self-pay | Admitting: Emergency Medicine

## 2016-12-08 ENCOUNTER — Emergency Department: Payer: Self-pay

## 2016-12-08 DIAGNOSIS — F17200 Nicotine dependence, unspecified, uncomplicated: Secondary | ICD-10-CM | POA: Insufficient documentation

## 2016-12-08 DIAGNOSIS — R6889 Other general symptoms and signs: Secondary | ICD-10-CM

## 2016-12-08 DIAGNOSIS — M5432 Sciatica, left side: Secondary | ICD-10-CM

## 2016-12-08 DIAGNOSIS — M545 Low back pain, unspecified: Secondary | ICD-10-CM

## 2016-12-08 DIAGNOSIS — M79605 Pain in left leg: Secondary | ICD-10-CM

## 2016-12-08 DIAGNOSIS — M5442 Lumbago with sciatica, left side: Secondary | ICD-10-CM | POA: Insufficient documentation

## 2016-12-08 DIAGNOSIS — R292 Abnormal reflex: Secondary | ICD-10-CM

## 2016-12-08 MED ORDER — NAPROXEN 500 MG PO TABS: 500.0000 mg | ORAL_TABLET | Freq: Two times a day (BID) | ORAL | 0 refills | Status: DC

## 2016-12-08 MED ORDER — OXYCODONE-ACETAMINOPHEN 5-325 MG PO TABS
1.0000 | ORAL_TABLET | Freq: Once | ORAL | Status: AC
  Administered 2016-12-08: 1 via ORAL
  Filled 2016-12-08: qty 1

## 2016-12-08 MED ORDER — LIDOCAINE 5 % EX PTCH
1.0000 | MEDICATED_PATCH | CUTANEOUS | Status: DC
  Administered 2016-12-08: 1 via TRANSDERMAL
  Filled 2016-12-08: qty 1

## 2016-12-08 MED ORDER — PREDNISONE 20 MG PO TABS: 40.0000 mg | ORAL_TABLET | Freq: Every day | ORAL | 0 refills | Status: DC

## 2016-12-08 MED ORDER — TRAMADOL HCL 50 MG PO TABS
50.0000 mg | ORAL_TABLET | Freq: Once | ORAL | Status: AC
  Administered 2016-12-08: 50 mg via ORAL
  Filled 2016-12-08: qty 1

## 2016-12-08 MED ORDER — PREDNISONE 20 MG PO TABS
60.0000 mg | ORAL_TABLET | Freq: Once | ORAL | Status: AC
  Administered 2016-12-08: 60 mg via ORAL
  Filled 2016-12-08: qty 3

## 2016-12-08 MED ORDER — KETOROLAC TROMETHAMINE 60 MG/2ML IM SOLN
15.0000 mg | Freq: Once | INTRAMUSCULAR | Status: AC
  Administered 2016-12-08: 15 mg via INTRAMUSCULAR
  Filled 2016-12-08: qty 2

## 2016-12-08 MED ORDER — LIDOCAINE 5 % EX PTCH: 1.0000 | MEDICATED_PATCH | Freq: Two times a day (BID) | CUTANEOUS | 0 refills | Status: DC

## 2016-12-08 NOTE — ED Notes (Signed)
Patient transported to MRI with EDT, Mayra

## 2016-12-08 NOTE — ED Notes (Signed)
Pt's xray negative, pt ambulatory in lobby; pt changed to flex wait

## 2016-12-08 NOTE — ED Triage Notes (Signed)
Pt reports bending over in the shower last week and hearing a snap. Pt reports ever since then she has had left hip, low back and left leg pain. Pt reports she is unable to fully extend or bend her left leg. Pt limping in triage.

## 2016-12-08 NOTE — ED Provider Notes (Signed)
[facility] Emergency Department Provider Note  ____________________________________________  Time seen: Approximately 9:10 PM  I have reviewed the triage vital signs and the nursing notes.   HISTORY  Chief Complaint Hip Pain; Back Pain; and Leg Pain    HPI Katrina Page is a 36 y.o. female who complains of sudden onset left lower back pain a week ago while leaning over in the shower to wash her legs. Pain has been persistent since then. She feels like she has weakness in the left leg causing her to have difficulty with hip flexion and lifting the leg. She is able to walk normally most of the time. She reports falling 3 times over the last week due to the pain which is worse with standing. Denies any paresthesia but does have sharp pain in the left lower back that radiates down the back of her leg. No bowel or bladder retention or incontinence. No fever.     No past medical history on file. None  There are no active problems to display for this patient.    Past Surgical History:  Procedure Laterality Date  . TUBAL LIGATION       Prior to Admission medications   Medication Sig Start Date End Date Taking? Authorizing Provider  naproxen (NAPROSYN) 500 MG tablet Take 1 tablet (500 mg total) by mouth 2 (two) times daily with a meal. 12/08/16   Carrie Mew, MD  predniSONE (DELTASONE) 20 MG tablet Take 2 tablets (40 mg total) by mouth daily. 12/08/16   Carrie Mew, MD  None   Allergies Patient has no known allergies.   No family history on file.  Social History Social History  Substance Use Topics  . Smoking status: Current Every Day Smoker  . Smokeless tobacco: Not on file  . Alcohol use No    Review of Systems  Constitutional:   No fever or chills.  ENT:   No sore throat. No rhinorrhea. Cardiovascular:   No chest pain. Respiratory:   No dyspnea or cough. Gastrointestinal:   Negative for abdominal pain, vomiting and diarrhea.   Genitourinary:   Negative for dysuria or difficulty urinating. Musculoskeletal:   Low back pain as above Neurological:   Left leg weakness as above 10-point ROS otherwise negative.  ____________________________________________   PHYSICAL EXAM:  VITAL SIGNS: ED Triage Vitals  Enc Vitals Group     BP 12/08/16 1752 (!) 152/98     Pulse Rate 12/08/16 1752 85     Resp 12/08/16 1752 18     Temp 12/08/16 1752 98.2 F (36.8 C)     Temp Source 12/08/16 1752 Oral     SpO2 12/08/16 1752 99 %     Weight 12/08/16 1753 250 lb (113.4 kg)     Height 12/08/16 1753 5\' 4"  (1.626 m)     Head Circumference --      Peak Flow --      Pain Score 12/08/16 1753 10     Pain Loc --      Pain Edu? --      Excl. in [place]? --     Vital signs reviewed, nursing assessments reviewed.   Constitutional:   Alert and oriented. Well appearing and in no distress. Eyes:   No scleral icterus. No conjunctival pallor. PERRL. EOMI.  No nystagmus. ENT   Head:   Normocephalic and atraumatic.   Nose:   No congestion/rhinnorhea. No septal hematoma   Mouth/Throat:   MMM, no pharyngeal erythema. No peritonsillar mass.  Neck:   No stridor. No SubQ emphysema. No meningismus. Hematological/Lymphatic/Immunilogical:   No cervical lymphadenopathy. Cardiovascular:   RRR. Symmetric bilateral radial and DP pulses.  No murmurs.  Respiratory:   Normal respiratory effort without tachypnea nor retractions. Breath sounds are clear and equal bilaterally. No wheezes/rales/rhonchi. Gastrointestinal:   Soft and nontender. Non distended. There is no CVA tenderness.  No rebound, rigidity, or guarding. Genitourinary:   deferred Musculoskeletal:   Normal range of motion in all extremities. No joint effusions.  No lower extremity tenderness.  No edema. No midline spinal tenderness. There is tenderness in the left lower back in the paraspinous musculature which reproduces her symptoms. Straight leg raise negative on the right.  Positive on the left at approximately 20. Neurologic:   Normal speech and language.  CN 2-10 normal. Intact and symmetric dorsiflexion and plantarflexion bilaterally.. Patient exhibits some decreased strength with hip flexion on the left against gravity and resistance. Unclear if this is pain limited. Decreased patellar reflex on the left, normal brisk reflex on the right  Skin:    Skin is warm, dry and intact. No rash noted.  No petechiae, purpura, or bullae.  ____________________________________________    LABS (pertinent positives/negatives) (all labs ordered are listed, but only abnormal results are displayed) Labs Reviewed - No data to display ____________________________________________   EKG    ____________________________________________    RADIOLOGY  Dg Hip Unilat W Or Wo Pelvis 2-3 Views Left  Result Date: 12/08/2016 CLINICAL DATA:  Acute onset of left hip pain.  Initial encounter. EXAM: DG HIP (WITH OR WITHOUT PELVIS) 2-3V LEFT COMPARISON:  None. FINDINGS: There is no evidence of fracture or dislocation. Both femoral heads are seated normally within their respective acetabula. The proximal left femur appears intact. No significant degenerative change is appreciated. The sacroiliac joints are unremarkable in appearance. The visualized bowel gas pattern is grossly unremarkable in appearance. IMPRESSION: No evidence of fracture or dislocation. Electronically Signed   By: Garald Balding M.D.   On: 12/08/2016 18:33    ____________________________________________   PROCEDURES Procedures  ____________________________________________   INITIAL IMPRESSION / ASSESSMENT AND PLAN / ED COURSE  Pertinent labs & imaging results that were available during my care of the patient were reviewed by me and considered in my medical decision making (see chart for details).  Symptoms are compatible with herniated disc and sciatica, which I would treat with NSAIDs and prednisone burst  and heating pad. However, she does seem to have a small amount of hip flexor weakness as well as diminished reflex in the left leg. This raises the suspicion that she could have cauda equina syndrome, so I will perform an MRI. Toradol prednisone and Percocet in the ED for symptom control.    ----------------------------------------- 10:25 PM on 12/08/2016 -----------------------------------------  Patient taken to MRI for study. Care of patient will be signed out to covering physician at the end of my shift pending MRI results. If MRI is negative for cauda equina pathology, patient is suitable for discharge home with NSAIDs and prednisone burst. Follow up with primary care for initial outpatient management.       ____________________________________________   FINAL CLINICAL IMPRESSION(S) / ED DIAGNOSES  Final diagnoses:  Low back pain radiating to left leg  Decreased reflex of lower extremity  Sciatica of left side      New Prescriptions   NAPROXEN (NAPROSYN) 500 MG TABLET    Take 1 tablet (500 mg total) by mouth 2 (two) times daily with a meal.  PREDNISONE (DELTASONE) 20 MG TABLET    Take 2 tablets (40 mg total) by mouth daily.     Portions of this note were generated with dragon dictation software. Dictation errors may occur despite best attempts at proofreading.    Carrie Mew, MD 12/08/16 2226

## 2016-12-08 NOTE — ED Provider Notes (Signed)
-----------------------------------------   11:37 PM on 12/08/2016 -----------------------------------------   Blood pressure 128/74, pulse 70, temperature 98.2 F (36.8 C), temperature source Oral, resp. rate 17, height 5\' 4"  (1.626 m), weight 250 lb (113.4 kg), last menstrual period 11/24/2016, SpO2 100 %.  Assuming care from Dr. Karma Greaser.  In short, Katrina Page is a 36 y.o. female with a chief complaint of Hip Pain; Back Pain; and Leg Pain .  Refer to the original H&P for additional details.  The current plan of care is to await the results of the MRI.   Clinical Course as of Dec 09 2335  Mon Dec 08, 2016  2335 1. Left subarticular disc protrusion at L4-L5 severely narrowing the left lateral recess and moderately narrowing the central spinal canal. This may be a source of left L5 radiculopathy. 2. Right subarticular disc protrusion at L5-S1 without associated stenosis.   MR LUMBAR SPINE WO CONTRAST [AW]    Clinical Course User Index [AW] Loney Hering, MD   Patient does have some disc protrusion at L4-L5 and does have some moderate narrowing of the central spinal canal. According to the MRI of this may be the source of her radiculopathy. The patient will be discharged to home with some prescriptions written by Dr. Joni Fears for Naprosyn and prednisone. She will be instructed to follow-up with [facility] clinic.   Loney Hering, MD 12/08/16 [id number]

## 2016-12-08 NOTE — ED Notes (Signed)
Pt noted ambulating in lobby, leaving lobby, stating that she is going outside

## 2016-12-09 NOTE — ED Provider Notes (Signed)
 -----------------------------------------   5:27 PM on 12/09/2016 -----------------------------------------  Today I reviewed the patient's chart regarding the results of her MRI. After signing her out, I see her MRI noted narrowing of the left lateral recess and central spinal canal. I called Dr. Era Bumpers of neurosurgery who advises that since she is not having any foot drop the patient does not need to return to the ER and the patient should follow-up with them in clinic. He notes that most likely symptoms will improve with steroids.  I attempted to call the patient at her listed home phone number. There was no answer. No answering machine her voicemail. I attempted to call her emergency contact, but that phone number is not accepting incoming phone calls. I was unable to contact the patient. Dr. Aris Lot states he will pass along the patient's information to his clinic staff to contact her for an appointment.   Carrie Mew, MD 12/09/16 1730

## 2017-05-18 ENCOUNTER — Emergency Department
Admission: EM | Admit: 2017-05-18 | Discharge: 2017-05-18 | Disposition: A | Discharge status: Home / Self Care | Payer: BLUE CROSS/BLUE SHIELD | Attending: Emergency Medicine | Admitting: Emergency Medicine

## 2017-05-18 ENCOUNTER — Encounter: Payer: Self-pay | Admitting: Emergency Medicine

## 2017-05-18 DIAGNOSIS — M5442 Lumbago with sciatica, left side: Secondary | ICD-10-CM | POA: Insufficient documentation

## 2017-05-18 DIAGNOSIS — G8929 Other chronic pain: Secondary | ICD-10-CM

## 2017-05-18 DIAGNOSIS — F17210 Nicotine dependence, cigarettes, uncomplicated: Secondary | ICD-10-CM | POA: Insufficient documentation

## 2017-05-18 MED ORDER — PREDNISONE 10 MG PO TABS: ORAL_TABLET | ORAL | 0 refills | Status: DC

## 2017-05-18 MED ORDER — HYDROCODONE-ACETAMINOPHEN 5-325 MG PO TABS: 1.0000 | ORAL_TABLET | Freq: Four times a day (QID) | ORAL | 0 refills | Status: DC | PRN

## 2017-05-18 NOTE — ED Triage Notes (Signed)
Low back pain radiating to L leg x 1 month. No recent fall or injury

## 2017-05-18 NOTE — ED Provider Notes (Signed)
[facility] Emergency Department Provider Note  ____________________________________________   First MD Initiated Contact with Patient 05/18/17 1217     (approximate)  I have reviewed the triage vital signs and the nursing notes.   HISTORY  Chief Complaint Back Pain   HPI Katrina Page is a 36 y.o. female is here complaining of low back pain with radiation to his left leg for one month. Patient denies any history of fall or injury. Patient states that she has had problems with her back and was seen in the emergency room "some time ago". Patient has not seen anyone since that time. She denies any incontinence of bowel or bladder or saddle anesthesias. Patient is able to ambulate without assistance. Currently she rates her pain as a 10 over 10.   History reviewed. No pertinent past medical history.  There are no active problems to display for this patient.   Past Surgical History:  Procedure Laterality Date  . TUBAL LIGATION      Prior to Admission medications   Medication Sig Start Date End Date Taking? Authorizing Provider  HYDROcodone-acetaminophen (NORCO/VICODIN) 5-325 MG tablet Take 1 tablet by mouth every 6 (six) hours as needed for moderate pain. 05/18/17   Johnn Hai, PA-C  predniSONE (DELTASONE) 10 MG tablet Take 6 tablets  today, on day 2 take 5 tablets, day 3 take 4 tablets, day 4 take 3 tablets, day 5 take  2 tablets and 1 tablet the last day 05/18/17   Johnn Hai, PA-C    Allergies Patient has no known allergies.  No family history on file.  Social History Social History  Substance Use Topics  . Smoking status: Current Every Day Smoker    Packs/day: 0.50    Types: Cigarettes  . Smokeless tobacco: Not on file  . Alcohol use No    Review of Systems Constitutional: No fever/chills Cardiovascular: Denies chest pain. Respiratory: Denies shortness of breath. Gastrointestinal: No abdominal pain.  No nausea, no  vomiting.  Genitourinary: Negative for dysuria. Musculoskeletal: Chronic back pain with radiation down left leg. Skin: Negative for rash. Neurological: Negative for  focal weakness or numbness. ____________________________________________   PHYSICAL EXAM:  VITAL SIGNS: ED Triage Vitals  Enc Vitals Group     BP 05/18/17 1152 134/85     Pulse Rate 05/18/17 1152 95     Resp 05/18/17 1152 20     Temp 05/18/17 1152 98.8 F (37.1 C)     Temp Source 05/18/17 1152 Oral     SpO2 05/18/17 1152 98 %     Weight 05/18/17 1153 280 lb (127 kg)     Height 05/18/17 1153 5\' 8"  (1.727 m)     Head Circumference --      Peak Flow --      Pain Score 05/18/17 1152 10     Pain Loc --      Pain Edu? --      Excl. in [place]? --     Constitutional: Alert and oriented. Well appearing and in no acute distress.Large body habitus. Eyes: Conjunctivae are normal.  Head: Atraumatic. Neck: No stridor.   Cardiovascular: Normal rate, regular rhythm. Grossly normal heart sounds.  Good peripheral circulation. Respiratory: Normal respiratory effort.  No retractions. Lungs CTAB. Gastrointestinal: Soft and nontender. No distention.  Musculoskeletal: Examination of the back there is no gross deformity. On palpation of the spinous processes of the thoracic and lumbar spine there is no tenderness. There is moderate tenderness on  palpation of the left SI area and soft tissue surrounding. Range of motion is restricted secondary to pain. Straight leg raises were approximately 30 bilaterally with pain. Neurologic:  Normal speech and language. No gross focal neurologic deficits are appreciated. Reflexes were 2+ bilaterally. No gait instability. Skin:  Skin is warm, dry and intact. Psychiatric: Mood and affect are normal. Speech and behavior are normal.  ____________________________________________   LABS (all labs ordered are listed, but only abnormal results are displayed)  Labs Reviewed - No data to  display   PROCEDURES  Procedure(s) performed: None  Procedures  Critical Care performed: No  ____________________________________________   INITIAL IMPRESSION / ASSESSMENT AND PLAN / ED COURSE  Pertinent labs & imaging results that were available during my care of the patient were reviewed by me and considered in my medical decision making (see chart for details).  In looking over her chart from March it appears that multiple phone calls were made to get the patient to the neurosurgeon at [facility]. It appears that the phone number that she left did not have voice messaging and the emergency contact number she gave was not a working number. She has not followed up with anyone because of the poor communication. Her MRI did show a disc protrusion at L4-L5 with severe narrowing and Dr. Aris Lot in neurosurgery felt that he should see her in the office. Patient states that she did not have any knowledge of this. Today she was given information on his contact and office number. Patient was given a prescription for Norco as needed for pain and also started on a prednisone Dosepak. She will call and make an appointment.   ___________________________________________   FINAL CLINICAL IMPRESSION(S) / ED DIAGNOSES  Final diagnoses:  Chronic left-sided low back pain with left-sided sciatica      NEW MEDICATIONS STARTED DURING THIS VISIT:  Discharge Medication List as of 05/18/2017  1:25 PM    START taking these medications   Details  HYDROcodone-acetaminophen (NORCO/VICODIN) 5-325 MG tablet Take 1 tablet by mouth every 6 (six) hours as needed for moderate pain., Starting Mon 05/18/2017, Print         Note:  This document was prepared using Dragon voice recognition software and may include unintentional dictation errors.    Johnn Hai, PA-C 05/18/17 1611    Harvest Dark, MD 05/20/17 [phone]

## 2017-05-18 NOTE — Discharge Instructions (Signed)
Follow-up with Dr. Aris Lot at [facility] who is the neurosurgeon that you're referred to in March. Begin taking prednisone 60 mg today and decrease by one tablet each day over the next 6 days. Norco every 6 hours if needed for moderate pain. Moist heat or ice to her back as needed for comfort. When lying on your back elevate your legs with 2 pillows under your knees. If lying on your side put one pillow between your knees.

## 2017-05-18 NOTE — ED Notes (Signed)
Pt states she was seen here for back pain about a month ago, told it was sciatica. States has been taking motrin and getting back rubs without relief. States has not followed up with anyone about pain. States mid/low back pain that radiates down L leg. States "sometimes I can't even lift my left leg." states "I've been having to miss work because of the pain." pt alert, oriented, able to move self from wheelchair to stretcher.

## 2017-08-17 ENCOUNTER — Emergency Department: Payer: BLUE CROSS/BLUE SHIELD

## 2017-08-17 ENCOUNTER — Encounter: Payer: Self-pay | Admitting: Emergency Medicine

## 2017-08-17 ENCOUNTER — Emergency Department
Admission: EM | Admit: 2017-08-17 | Discharge: 2017-08-17 | Disposition: A | Discharge status: Home / Self Care | Payer: BLUE CROSS/BLUE SHIELD | Attending: Emergency Medicine | Admitting: Emergency Medicine

## 2017-08-17 ENCOUNTER — Other Ambulatory Visit: Payer: Self-pay

## 2017-08-17 DIAGNOSIS — Y929 Unspecified place or not applicable: Secondary | ICD-10-CM | POA: Insufficient documentation

## 2017-08-17 DIAGNOSIS — W500XXA Accidental hit or strike by another person, initial encounter: Secondary | ICD-10-CM | POA: Insufficient documentation

## 2017-08-17 DIAGNOSIS — Y999 Unspecified external cause status: Secondary | ICD-10-CM | POA: Insufficient documentation

## 2017-08-17 DIAGNOSIS — S2232XA Fracture of one rib, left side, initial encounter for closed fracture: Secondary | ICD-10-CM | POA: Insufficient documentation

## 2017-08-17 DIAGNOSIS — Y939 Activity, unspecified: Secondary | ICD-10-CM | POA: Insufficient documentation

## 2017-08-17 DIAGNOSIS — Z79899 Other long term (current) drug therapy: Secondary | ICD-10-CM | POA: Insufficient documentation

## 2017-08-17 DIAGNOSIS — F17210 Nicotine dependence, cigarettes, uncomplicated: Secondary | ICD-10-CM | POA: Insufficient documentation

## 2017-08-17 MED ORDER — OXYCODONE-ACETAMINOPHEN 5-325 MG PO TABS: 1.0000 | ORAL_TABLET | Freq: Four times a day (QID) | ORAL | 0 refills | Status: DC | PRN

## 2017-08-17 MED ORDER — OXYCODONE-ACETAMINOPHEN 5-325 MG PO TABS
2.0000 | ORAL_TABLET | Freq: Once | ORAL | Status: AC
Start: 2017-08-17 — End: 2017-08-17
  Administered 2017-08-17: 2 via ORAL
  Filled 2017-08-17: qty 2

## 2017-08-17 NOTE — ED Triage Notes (Signed)
Pt reports that she was playing with her daughter and she jumped on her chest. She states that she felt a crunch and is now having trouble breathing she is unable to take a deep breath.

## 2017-08-17 NOTE — Discharge Instructions (Signed)
Please take your pain medication as needed for severe symptoms and follow-up with your primary care physician in 2 days if your pain is not significantly improved.  Please return to the emergency department for any concerns such as fevers, chills, worsening pain, or for any other issues whatsoever.  It was a pleasure to take care of you today, and thank you for coming to our emergency department.  If you have any questions or concerns before leaving please ask the nurse to grab me and I'm more than happy to go through your aftercare instructions again.  If you were prescribed any opioid pain medication today such as Norco, Vicodin, Percocet, morphine, hydrocodone, or oxycodone please make sure you do not drive when you are taking this medication as it can alter your ability to drive safely.  If you have any concerns once you are home that you are not improving or are in fact getting worse before you can make it to your follow-up appointment, please do not hesitate to call [phone] and come back for further evaluation.  Darel Hong, MD  Results for orders placed or performed during the hospital encounter of 04/03/16  Lipase, blood  Result Value Ref Range   Lipase 29 11 - 51 U/L  Comprehensive metabolic panel  Result Value Ref Range   Sodium 141 135 - 145 mmol/L   Potassium 3.5 3.5 - 5.1 mmol/L   Chloride 106 101 - 111 mmol/L   CO2 24 22 - 32 mmol/L   Glucose, Bld 141 (H) 65 - 99 mg/dL   BUN 8 6 - 20 mg/dL   Creatinine, Ser 1.05 (H) 0.44 - 1.00 mg/dL   Calcium 9.2 8.9 - 10.3 mg/dL   Total Protein 8.0 6.5 - 8.1 g/dL   Albumin 4.3 3.5 - 5.0 g/dL   AST 28 15 - 41 U/L   ALT 18 14 - 54 U/L   Alkaline Phosphatase 61 38 - 126 U/L   Total Bilirubin 0.4 0.3 - 1.2 mg/dL   GFR calc non Af Amer >60 >60 mL/min   GFR calc Af Amer >60 >60 mL/min   Anion gap 11 5 - 15  CBC  Result Value Ref Range   WBC 10.5 3.6 - 11.0 K/uL   RBC 5.14 3.80 - 5.20 MIL/uL   Hemoglobin 14.0 12.0 - 16.0 g/dL   HCT 43.3  35.0 - 47.0 %   MCV 84.3 80.0 - 100.0 fL   MCH 27.2 26.0 - 34.0 pg   MCHC 32.2 32.0 - 36.0 g/dL   RDW 18.7 (H) 11.5 - 14.5 %   Platelets 271 150 - 440 K/uL  Urinalysis complete, with microscopic  Result Value Ref Range   Color, Urine YELLOW (A) YELLOW   APPearance CLEAR (A) CLEAR   Glucose, UA NEGATIVE NEGATIVE mg/dL   Bilirubin Urine NEGATIVE NEGATIVE   Ketones, ur NEGATIVE NEGATIVE mg/dL   Specific Gravity, Urine 1.016 1.005 - 1.030   Hgb urine dipstick NEGATIVE NEGATIVE   pH 7.0 5.0 - 8.0   Protein, ur 30 (A) NEGATIVE mg/dL   Nitrite NEGATIVE NEGATIVE   Leukocytes, UA TRACE (A) NEGATIVE   RBC / HPF 0-5 0 - 5 RBC/hpf   WBC, UA 6-30 0 - 5 WBC/hpf   Bacteria, UA NONE SEEN NONE SEEN   Squamous Epithelial / LPF 0-5 (A) NONE SEEN   Mucus PRESENT   Pregnancy, urine POC  Result Value Ref Range   Preg Test, Ur NEGATIVE NEGATIVE   Dg Chest 2  View  Result Date: 08/17/2017 CLINICAL DATA:  Daughter jumped on chest 2 days ago. Severe chest pain and dyspnea. Initial encounter. EXAM: CHEST  2 VIEW COMPARISON:  07/30/2010 FINDINGS: The heart size and mediastinal contours are within normal limits. Both lungs are clear. No evidence of pneumothorax or hemothorax. The visualized skeletal structures are unremarkable. IMPRESSION: Stable exam.  No active cardiopulmonary disease. Electronically Signed   By: Earle Gell M.D.   On: 08/17/2017 13:28

## 2017-08-17 NOTE — ED Provider Notes (Signed)
[facility] Emergency Department Provider Note  ____________________________________________   First MD Initiated Contact with Patient 08/17/17 1332     (approximate)  I have reviewed the triage vital signs and the nursing notes.   HISTORY  Chief Complaint Chest Injury   HPI Katrina Page is a 36 y.o. female who self presents the emergency department with severe left-sided chest pain that began suddenly 2 days ago when her teenage daughter jumped on her chest to give her a hug.  The pain is severe and aching.  It is worse with deep inspiration and somewhat improved with rest.  She is taking no medications and nothing seems to help in particular.  There is no abdominal pain nausea or vomiting.  History reviewed. No pertinent past medical history.  There are no active problems to display for this patient.   Past Surgical History:  Procedure Laterality Date  . TUBAL LIGATION      Prior to Admission medications   Medication Sig Start Date End Date Taking? Authorizing Provider  HYDROcodone-acetaminophen (NORCO/VICODIN) 5-325 MG tablet Take 1 tablet by mouth every 6 (six) hours as needed for moderate pain. 05/18/17   Johnn Hai, PA-C  oxyCODONE-acetaminophen (ROXICET) 5-325 MG tablet Take 1 tablet by mouth every 6 (six) hours as needed for severe pain. 08/17/17   Darel Hong, MD  predniSONE (DELTASONE) 10 MG tablet Take 6 tablets  today, on day 2 take 5 tablets, day 3 take 4 tablets, day 4 take 3 tablets, day 5 take  2 tablets and 1 tablet the last day 05/18/17   Johnn Hai, PA-C    Allergies Patient has no known allergies.  History reviewed. No pertinent family history.  Social History Social History   Tobacco Use  . Smoking status: Current Every Day Smoker    Packs/day: 0.50    Types: Cigarettes  Substance Use Topics  . Alcohol use: No  . Drug use: No    Review of Systems Constitutional: No fever/chills ENT: No sore  throat. Cardiovascular: Positive for chest pain. Respiratory: Positive for shortness of breath. Gastrointestinal: No abdominal pain.  No nausea, no vomiting.  No diarrhea.  No constipation. Musculoskeletal: Negative for back pain. Neurological: Negative for headaches   ____________________________________________   PHYSICAL EXAM:  VITAL SIGNS: ED Triage Vitals  Enc Vitals Group     BP 08/17/17 1231 (!) 164/83     Pulse Rate 08/17/17 1231 83     Resp 08/17/17 1231 20     Temp 08/17/17 1231 98.2 F (36.8 C)     Temp Source 08/17/17 1231 Oral     SpO2 08/17/17 1231 97 %     Weight 08/17/17 1233 300 lb (136.1 kg)     Height 08/17/17 1233 5\' 9"  (1.753 m)     Head Circumference --      Peak Flow --      Pain Score 08/17/17 1238 10     Pain Loc --      Pain Edu? --      Excl. in [place]? --     Constitutional: Alert and oriented x4 tearful uncomfortable appearing no diaphoresis speaks in full clear sentences Head: Atraumatic. Nose: No congestion/rhinnorhea. Mouth/Throat: No trismus Neck: No stridor.   Cardiovascular: Regular rate and rhythm chest wall stable no crepitus or instability Respiratory: Normal respiratory effort.  No retractions. Gastrointestinal: Soft nontender Neurologic:  Normal speech and language. No gross focal neurologic deficits are appreciated.  Skin:  Skin is  warm, dry and intact. No rash noted.    ____________________________________________  LABS (all labs ordered are listed, but only abnormal results are displayed)  Labs Reviewed - No data to display   __________________________________________  EKG   ____________________________________________  RADIOLOGY  Chest x-ray reviewed and reviewed with no acute disease ____________________________________________   DIFFERENTIAL includes but not limited to  Rib fracture, rib contusion, pneumothorax, hemothorax   PROCEDURES  Procedure(s) performed: no  Procedures  Critical Care  performed: no  Observation: no ____________________________________________   INITIAL IMPRESSION / ASSESSMENT AND PLAN / ED COURSE  Pertinent labs & imaging results that were available during my care of the patient were reviewed by me and considered in my medical decision making (see chart for details).  The patient arrives uncomfortable appearing with 48-hour old trauma.  Her symptoms are most consistent with rib fracture versus rib contusion.  X-rays fortunately negative for pneumothorax or hemothorax.  We will treat her symptomatically with opioids and refer her back to primary care.  She verbalizes understanding and agreement with the plan and feels significantly improved after her pain was adequately controlled.      ____________________________________________   FINAL CLINICAL IMPRESSION(S) / ED DIAGNOSES  Final diagnoses:  Closed fracture of one rib of left side, initial encounter      NEW MEDICATIONS STARTED DURING THIS VISIT:  This SmartLink is deprecated. Use AVSMEDLIST instead to display the medication list for a patient.   Note:  This document was prepared using Dragon voice recognition software and may include unintentional dictation errors.      Darel Hong, MD 08/19/17 1217

## 2018-04-03 ENCOUNTER — Emergency Department
Admission: EM | Admit: 2018-04-03 | Discharge: 2018-04-04 | Disposition: A | Discharge status: Home / Self Care | Payer: Self-pay | Attending: Emergency Medicine | Admitting: Emergency Medicine

## 2018-04-03 ENCOUNTER — Encounter: Payer: Self-pay | Admitting: Emergency Medicine

## 2018-04-03 ENCOUNTER — Other Ambulatory Visit: Payer: Self-pay

## 2018-04-03 DIAGNOSIS — R197 Diarrhea, unspecified: Secondary | ICD-10-CM | POA: Insufficient documentation

## 2018-04-03 DIAGNOSIS — Z9049 Acquired absence of other specified parts of digestive tract: Secondary | ICD-10-CM | POA: Insufficient documentation

## 2018-04-03 DIAGNOSIS — F17210 Nicotine dependence, cigarettes, uncomplicated: Secondary | ICD-10-CM | POA: Insufficient documentation

## 2018-04-03 DIAGNOSIS — R112 Nausea with vomiting, unspecified: Secondary | ICD-10-CM | POA: Insufficient documentation

## 2018-04-03 LAB — COMPREHENSIVE METABOLIC PANEL
ALBUMIN: 3.8 g/dL (ref 3.5–5.0)
ALT: 14 U/L (ref 0–44)
AST: 21 U/L (ref 15–41)
Alkaline Phosphatase: 54 U/L (ref 38–126)
Anion gap: 10 (ref 5–15)
BILIRUBIN TOTAL: 0.7 mg/dL (ref 0.3–1.2)
BUN: 6 mg/dL (ref 6–20)
CHLORIDE: 104 mmol/L (ref 98–111)
CO2: 29 mmol/L (ref 22–32)
CREATININE: 0.87 mg/dL (ref 0.44–1.00)
Calcium: 9 mg/dL (ref 8.9–10.3)
GFR calc Af Amer: >60 mL/min (ref >60)
GLUCOSE: 116 mg/dL — AB (ref 70–99)
POTASSIUM: 3.8 mmol/L (ref 3.5–5.1)
Sodium: 143 mmol/L (ref 135–145)
Total Protein: 7.3 g/dL (ref 6.5–8.1)

## 2018-04-03 LAB — CBC
HEMATOCRIT: 38.2 % (ref 35.0–47.0)
Hemoglobin: 12.3 g/dL (ref 12.0–16.0)
MCH: 29 pg (ref 26.0–34.0)
MCHC: 32.2 g/dL (ref 32.0–36.0)
MCV: 90 fL (ref 80.0–100.0)
PLATELETS: 225 10*3/uL (ref 150–440)
RBC: 4.25 MIL/uL (ref 3.80–5.20)
RDW: 23.9 % — AB (ref 11.5–14.5)
WBC: 5.8 10*3/uL (ref 3.6–11.0)

## 2018-04-03 LAB — LIPASE, BLOOD: LIPASE: 28 U/L (ref 11–51)

## 2018-04-03 MED ORDER — SODIUM CHLORIDE 0.9 % IV BOLUS
1000.0000 mL | Freq: Once | INTRAVENOUS | Status: AC
  Administered 2018-04-03: 1000 mL via INTRAVENOUS

## 2018-04-03 MED ORDER — ONDANSETRON 4 MG PO TBDP
4.0000 mg | ORAL_TABLET | Freq: Once | ORAL | Status: AC | PRN
  Administered 2018-04-03: 4 mg via ORAL
  Filled 2018-04-03: qty 1

## 2018-04-03 MED ORDER — ONDANSETRON HCL 4 MG/2ML IJ SOLN
4.0000 mg | INTRAMUSCULAR | Status: AC
  Administered 2018-04-03: 4 mg via INTRAVENOUS
  Filled 2018-04-03: qty 2

## 2018-04-03 NOTE — ED Triage Notes (Signed)
Pt to ED via POV c/o N/V/D x 3 days. Pt is in NAD at this time

## 2018-04-03 NOTE — ED Notes (Signed)
Patient states she threw up the zofran that was given in triage. Patient states they usually give her Phenergan for nausea.

## 2018-04-03 NOTE — ED Notes (Signed)
Patient tried, but was unable to void at this time.

## 2018-04-03 NOTE — ED Notes (Signed)
Hat placed in toilet and instructions given for use.

## 2018-04-03 NOTE — ED Provider Notes (Signed)
[facility] Emergency Department Provider Note  ____________________________________________   First MD Initiated Contact with Patient 04/03/18 2322     (approximate)  I have reviewed the triage vital signs and the nursing notes.   HISTORY  Chief Complaint Nausea; Emesis; and Diarrhea    HPI Katrina Page is a 37 y.o. female with no contributory past medical history who presents for evaluation after having acute onset of severe nausea, vomiting, and diarrhea for the last 3 days.  She reports that she ate at [facility] about 3 evenings ago and felt like the food was not quite right.  She became ill that night with vomiting and diarrhea and the symptoms have persisted.  She states that she has been unable to eat or drink anything, with even small sips of fluids causing her to vomit.  She has had some intermittent cramping abdominal pain that can be mild to severe but is not currently having any pain.  She has been feeling weak all over today.  She denies fever/chills, chest pain, shortness of breath.  She has seen no blood in her stool and denies dysuria and hematuria.  No one else around her has been ill.  The symptoms are severe and nothing is making them better in the in the attempt at oral intake makes it worse.  History reviewed. No pertinent past medical history.  There are no active problems to display for this patient.   Past Surgical History:  Procedure Laterality Date  . CHOLECYSTECTOMY    . TUBAL LIGATION      Prior to Admission medications   Medication Sig Start Date End Date Taking? Authorizing Provider  HYDROcodone-acetaminophen (NORCO/VICODIN) 5-325 MG tablet Take 1 tablet by mouth every 6 (six) hours as needed for moderate pain. 05/18/17   Johnn Hai, PA-C  metoCLOPramide (REGLAN) 10 MG tablet Take 1 tablet (10 mg total) by mouth every 8 (eight) hours as needed. 04/04/18   Loney Hering, MD  oxyCODONE-acetaminophen (ROXICET) 5-325  MG tablet Take 1 tablet by mouth every 6 (six) hours as needed for severe pain. 08/17/17   Darel Hong, MD  predniSONE (DELTASONE) 10 MG tablet Take 6 tablets  today, on day 2 take 5 tablets, day 3 take 4 tablets, day 4 take 3 tablets, day 5 take  2 tablets and 1 tablet the last day 05/18/17   Johnn Hai, PA-C    Allergies Patient has no known allergies.  No family history on file.  Social History Social History   Tobacco Use  . Smoking status: Current Every Day Smoker    Packs/day: 0.50    Types: Cigarettes  . Smokeless tobacco: Never Used  Substance Use Topics  . Alcohol use: No  . Drug use: No    Review of Systems Constitutional: No fever/chills Eyes: No visual changes. ENT: No sore throat. Cardiovascular: Denies chest pain. Respiratory: Denies shortness of breath. Gastrointestinal: N/V/D as described above.  Intermittent abdominal cramping. Genitourinary: Negative for dysuria. Musculoskeletal: Negative for neck pain.  Negative for back pain. Integumentary: Negative for rash. Neurological: Negative for headaches, focal weakness or numbness.   ____________________________________________   PHYSICAL EXAM:  VITAL SIGNS: ED Triage Vitals  Enc Vitals Group     BP 04/03/18 1649 130/86     Pulse Rate 04/03/18 1649 (!) 103     Resp 04/03/18 1649 16     Temp 04/03/18 1649 98.6 F (37 C)     Temp Source 04/03/18 1649 Oral  SpO2 04/03/18 1649 100 %     Weight 04/03/18 1651 113.4 kg (250 lb)     Height 04/03/18 1651 1.753 m (5\' 9" )     Head Circumference --      Peak Flow --      Pain Score 04/03/18 1651 0     Pain Loc --      Pain Edu? --      Excl. in [place]? --     Constitutional: Alert and oriented.  No acute distress although she does appear uncomfortable. Eyes: Conjunctivae are normal.  Head: Atraumatic. Nose: No congestion/rhinnorhea. Mouth/Throat: Mucous membranes are moist. Neck: No stridor.  No meningeal signs.   Cardiovascular: Normal rate,  regular rhythm. Good peripheral circulation. Grossly normal heart sounds. Respiratory: Normal respiratory effort.  No retractions. Lungs CTAB. Gastrointestinal: Soft and nontender, even to deep and extensive palpation.  No distention. Musculoskeletal: No lower extremity tenderness nor edema. No gross deformities of extremities. Neurologic:  Normal speech and language. No gross focal neurologic deficits are appreciated.  Skin:  Skin is warm, dry and intact. No rash noted. Psychiatric: Mood and affect are normal. Speech and behavior are normal.  ____________________________________________   LABS (all labs ordered are listed, but only abnormal results are displayed)  Labs Reviewed  COMPREHENSIVE METABOLIC PANEL - Abnormal; Notable for the following components:      Result Value   Glucose, Bld 116 (*)    All other components within normal limits  CBC - Abnormal; Notable for the following components:   RDW 23.9 (*)    All other components within normal limits  GASTROINTESTINAL PANEL BY PCR, STOOL (REPLACES STOOL CULTURE)  C DIFFICILE QUICK SCREEN W PCR REFLEX  LIPASE, BLOOD  URINALYSIS, COMPLETE (UACMP) WITH MICROSCOPIC  POC URINE PREG, ED   ____________________________________________  EKG  None - EKG not ordered by ED physician ____________________________________________  RADIOLOGY   ED MD interpretation: No imaging indicated  Official radiology report(s): No results found.  ____________________________________________   PROCEDURES  Critical Care performed: No   Procedure(s) performed:   Procedures   ____________________________________________   INITIAL IMPRESSION / ASSESSMENT AND PLAN / ED COURSE  As part of my medical decision making, I reviewed the following data within the [place] notes reviewed and incorporated, Labs reviewed  and Patient signed out to Dr. Dahlia Client.    Differential diagnosis includes, but is not limited  to, viral gastroenteritis, bacterial gastroenteritis such as Salmonella or Campylobacter, ileus/SBO, IBD.  Fortunately the patient has no pain and no tenderness to palpation, just intermittent cramping which is consistent with her primary symptoms of vomiting and diarrhea.  In spite of her symptoms, she has reassuring labs with normal metabolic panel, no electrolyte abnormalities, normal lipase, and normal CBC with no leukocytosis.  Upon triage she was very mildly tachycardic and she had to wait more than 5 hours in the waiting room for bed placement.  At the time I saw her in the ED she has a pulse rate in the 60s and her vital signs are otherwise reassuring with no fever.  I explained that I thought the symptoms were most likely viral but given the duration and severity of symptoms, if she can provide a stool sample I will check GI pathogens and rule out C. difficile infection.  I am providing 1 L normal saline by IV and 4 mg of IV Zofran; in triage she received 4 mg of Zofran by mouth but states that she threw it back  up.  Surprisingly she does not appear volume depleted or dehydrated but I think that the IV fluids will help her feel better.  Given the lack of pain and tenderness and distention I do not believe that she needs a CT scan.  The goal tonight is to control her symptoms, replete some volume, and hopefully make her feel well enough that she will be able to be discharged and follow-up as an outpatient for probable viral gastroenteritis.  She states that she understands and agrees with the plan.  Clinical Course as of Apr 04 100  Sat Apr 03, 2018  2321 Transferring ED care to Dr. Dahlia Client to follow up clinically and reassess, as well as check on stool studies if patient is able to provide a sample.  Anticipate discharge if patient is able to tolerate PO.   [CF]    Clinical Course User Index [CF] Hinda Kehr, MD    ____________________________________________  FINAL CLINICAL IMPRESSION(S)  / ED DIAGNOSES  Final diagnoses:  Nausea vomiting and diarrhea     MEDICATIONS GIVEN DURING THIS VISIT:  Medications  ondansetron (ZOFRAN-ODT) disintegrating tablet 4 mg (4 mg Oral Given 04/03/18 1655)  sodium chloride 0.9 % bolus 1,000 mL (0 mLs Intravenous Stopped 04/04/18 0015)  ondansetron (ZOFRAN) injection 4 mg (4 mg Intravenous Given 04/03/18 2255)  metoCLOPramide (REGLAN) injection 10 mg (10 mg Intravenous Given 04/04/18 0009)     ED Discharge Orders        Ordered    metoCLOPramide (REGLAN) 10 MG tablet  Every 8 hours PRN     04/04/18 0101       Note:  This document was prepared using Dragon voice recognition software and may include unintentional dictation errors.    Hinda Kehr, MD 04/04/18 [phone]

## 2018-04-04 MED ORDER — METOCLOPRAMIDE HCL 10 MG PO TABS: 10.0000 mg | ORAL_TABLET | Freq: Three times a day (TID) | ORAL | 0 refills | Status: DC | PRN

## 2018-04-04 MED ORDER — METOCLOPRAMIDE HCL 5 MG/ML IJ SOLN
10.0000 mg | Freq: Once | INTRAMUSCULAR | Status: AC
  Administered 2018-04-04: 10 mg via INTRAVENOUS
  Filled 2018-04-04: qty 2

## 2018-04-04 NOTE — ED Provider Notes (Signed)
-----------------------------------------   1:00 AM on 04/04/2018 -----------------------------------------   Blood pressure (!) 128/116, pulse 63, temperature 98.6 F (37 C), temperature source Oral, resp. rate 16, height 5\' 9"  (1.753 m), weight 113.4 kg (250 lb), last menstrual period 03/29/2018, SpO2 100 %.  Assuming care from Dr. Karma Greaser.  In short, Katrina Page is a 37 y.o. female with a chief complaint of Nausea; Emesis; and Diarrhea .  Refer to the original H&P for additional details.  The current plan of care is to reassess the patient and determine her disposition.   Clinical Course as of Apr 04 99  Sat Apr 03, 2018  2321 Transferring ED care to Dr. Dahlia Client to follow up clinically and reassess, as well as check on stool studies if patient is able to provide a sample.  Anticipate discharge if patient is able to tolerate PO.   [CF]    Clinical Course User Index [CF] Hinda Kehr, MD   The patient finished her fluids and was witnessed sitting up and speaking and laughing with her son with no difficulty.  The patient did receive some Reglan for continued nausea.  After some time she states that she would like to go home.  She has not provided a stool sample but she states that she is ready to go home.  We will encourage the patient to follow-up with the acute care clinic or her primary care physician.  She should return with any worsening symptoms or any other concerns.   Katrina Hering, MD 04/04/18 [phone]

## 2018-04-04 NOTE — ED Notes (Signed)
Patient states that she is able to tolerate the ginger ale and that she is ready to go home. Dr. Dahlia Client notified.

## 2018-04-04 NOTE — Discharge Instructions (Addendum)
Please follow up with your primary care physician for further evaluation °

## 2018-04-04 NOTE — ED Notes (Signed)
Patient given ginger ale at this time. Patient states that she is still nauseated but that she wants to go home. Patient has not had any vomiting or diarrhea.

## 2018-05-15 ENCOUNTER — Other Ambulatory Visit: Payer: Self-pay

## 2018-05-15 ENCOUNTER — Emergency Department: Payer: Self-pay

## 2018-05-15 ENCOUNTER — Emergency Department
Admission: EM | Admit: 2018-05-15 | Discharge: 2018-05-15 | Disposition: A | Discharge status: Home / Self Care | Payer: Self-pay | Attending: Emergency Medicine | Admitting: Emergency Medicine

## 2018-05-15 ENCOUNTER — Encounter: Payer: Self-pay | Admitting: Emergency Medicine

## 2018-05-15 DIAGNOSIS — F17210 Nicotine dependence, cigarettes, uncomplicated: Secondary | ICD-10-CM | POA: Insufficient documentation

## 2018-05-15 DIAGNOSIS — W231XXA Caught, crushed, jammed, or pinched between stationary objects, initial encounter: Secondary | ICD-10-CM | POA: Insufficient documentation

## 2018-05-15 DIAGNOSIS — Y92003 Bedroom of unspecified non-institutional (private) residence as the place of occurrence of the external cause: Secondary | ICD-10-CM | POA: Insufficient documentation

## 2018-05-15 DIAGNOSIS — Z79899 Other long term (current) drug therapy: Secondary | ICD-10-CM | POA: Insufficient documentation

## 2018-05-15 DIAGNOSIS — Y9383 Activity, rough housing and horseplay: Secondary | ICD-10-CM | POA: Insufficient documentation

## 2018-05-15 DIAGNOSIS — S41111A Laceration without foreign body of right upper arm, initial encounter: Secondary | ICD-10-CM | POA: Insufficient documentation

## 2018-05-15 DIAGNOSIS — Z23 Encounter for immunization: Secondary | ICD-10-CM | POA: Insufficient documentation

## 2018-05-15 DIAGNOSIS — Y998 Other external cause status: Secondary | ICD-10-CM | POA: Insufficient documentation

## 2018-05-15 DIAGNOSIS — S5011XA Contusion of right forearm, initial encounter: Secondary | ICD-10-CM | POA: Insufficient documentation

## 2018-05-15 MED ORDER — TETANUS-DIPHTH-ACELL PERTUSSIS 5-2.5-18.5 LF-MCG/0.5 IM SUSP
0.5000 mL | Freq: Once | INTRAMUSCULAR | Status: AC
  Administered 2018-05-15: 0.5 mL via INTRAMUSCULAR
  Filled 2018-05-15: qty 0.5

## 2018-05-15 MED ORDER — BACITRACIN-NEOMYCIN-POLYMYXIN 400-5-5000 EX OINT
TOPICAL_OINTMENT | Freq: Once | CUTANEOUS | Status: AC
  Administered 2018-05-15: 1 via TOPICAL

## 2018-05-15 MED ORDER — BACITRACIN-NEOMYCIN-POLYMYXIN 400-5-5000 EX OINT
TOPICAL_OINTMENT | CUTANEOUS | Status: AC
  Administered 2018-05-15: 1 via TOPICAL
  Filled 2018-05-15: qty 1

## 2018-05-15 MED ORDER — CEPHALEXIN 500 MG PO CAPS: 1000.0000 mg | ORAL_CAPSULE | Freq: Two times a day (BID) | ORAL | 0 refills | Status: DC

## 2018-05-15 MED ORDER — MELOXICAM 15 MG PO TABS: 15.0000 mg | ORAL_TABLET | Freq: Every day | ORAL | 0 refills | Status: DC

## 2018-05-15 NOTE — ED Provider Notes (Signed)
[facility] Emergency Department Provider Note  ____________________________________________  Time seen: Approximately 3:06 PM  I have reviewed the triage vital signs and the nursing notes.   HISTORY  Chief Complaint Laceration    HPI Katrina Page is a 37 y.o. female who presents the emergency department complaining of right arm pain.  Patient reports that she was play wrestling with her adult daughter 4 days ago when she tripped, fell.  Patient reports that the way she landed caused a right upper extremity injury.  She reports that she landed on the bed but her arm became trapped in between the bed and the wall.  Patient sustained a laceration to the forearm.  Patient reports that she is having pain from her mid humerus into her mid forearm.  She also reports healing laceration.  Patient reports that she has had a serosanguineous discharge from wound but no frank pus.  She denies any gross erythema or edema around the wound.  Patient states that her range of motion is limited in her elbow due to pain.  She denies any other injury or complaint at this time.  She is unsure when her last tetanus shot was.  Patient denies any headache, neck pain, chest pain, shortness of breath, abdominal pain, nausea or vomiting.  No medications for this complaint prior to arrival.  Injury occurred 4 days prior.    History reviewed. No pertinent past medical history.  There are no active problems to display for this patient.   Past Surgical History:  Procedure Laterality Date  . CHOLECYSTECTOMY    . TUBAL LIGATION      Prior to Admission medications   Medication Sig Start Date End Date Taking? Authorizing Provider  cephALEXin (KEFLEX) 500 MG capsule Take 2 capsules (1,000 mg total) by mouth 2 (two) times daily. 05/15/18   Baker Moronta, Charline Bills, PA-C  HYDROcodone-acetaminophen (NORCO/VICODIN) 5-325 MG tablet Take 1 tablet by mouth every 6 (six) hours as needed for moderate pain.  05/18/17   Johnn Hai, PA-C  meloxicam (MOBIC) 15 MG tablet Take 1 tablet (15 mg total) by mouth daily. 05/15/18   Wille Aubuchon, Charline Bills, PA-C  metoCLOPramide (REGLAN) 10 MG tablet Take 1 tablet (10 mg total) by mouth every 8 (eight) hours as needed. 04/04/18   Loney Hering, MD  oxyCODONE-acetaminophen (ROXICET) 5-325 MG tablet Take 1 tablet by mouth every 6 (six) hours as needed for severe pain. 08/17/17   Darel Hong, MD  predniSONE (DELTASONE) 10 MG tablet Take 6 tablets  today, on day 2 take 5 tablets, day 3 take 4 tablets, day 4 take 3 tablets, day 5 take  2 tablets and 1 tablet the last day 05/18/17   Johnn Hai, PA-C    Allergies Patient has no known allergies.  No family history on file.  Social History Social History   Tobacco Use  . Smoking status: Current Every Day Smoker    Packs/day: 0.50    Types: Cigarettes  . Smokeless tobacco: Never Used  Substance Use Topics  . Alcohol use: No  . Drug use: No     Review of Systems  Constitutional: No fever/chills Eyes: No visual changes. No discharge ENT: No upper respiratory complaints. Cardiovascular: no chest pain. Respiratory: no cough. No SOB. Gastrointestinal: No abdominal pain.  No nausea, no vomiting.   Musculoskeletal: Positive for right upper extremity pain Skin: Positive for laceration to the right forearm Neurological: Negative for headaches, focal weakness or numbness. 10-point ROS otherwise  negative.  ____________________________________________   PHYSICAL EXAM:  VITAL SIGNS: ED Triage Vitals  Enc Vitals Group     BP 05/15/18 1443 (!) 168/91     Pulse Rate 05/15/18 1443 80     Resp 05/15/18 1443 16     Temp 05/15/18 1443 98.3 F (36.8 C)     Temp Source 05/15/18 1443 Oral     SpO2 05/15/18 1443 100 %     Weight 05/15/18 1444 250 lb (113.4 kg)     Height 05/15/18 1444 5\' 9"  (1.753 m)     Head Circumference --      Peak Flow --      Pain Score 05/15/18 1444 7     Pain Loc --       Pain Edu? --      Excl. in [place]? --      Constitutional: Alert and oriented. Well appearing and in no acute distress. Eyes: Conjunctivae are normal. PERRL. EOMI. Head: Atraumatic. Neck: No stridor.  No cervical spine tenderness to palpation.  Cardiovascular: Normal rate, regular rhythm. Normal S1 and S2.  Good peripheral circulation. Respiratory: Normal respiratory effort without tachypnea or retractions. Lungs CTAB. Good air entry to the bases with no decreased or absent breath sounds. Musculoskeletal: Full range of motion to all extremities. No gross deformities appreciated.  Visualization of the right upper extremity reveals edema, ecchymosis from mid humerus to mid forearm.  No gross erythema.  Lacerations noted to forearm as described below.  Patient is able to extend, flex the elbow.  She is able to supinate and pronate forearm.  Doing so elicits great pain.  Patient is extremely tender to palpation in the supracondylar region as well as bilateral epicondyles, humeral and ulnar head.  No palpable abnormality or deficits appreciated.  Examination of the shoulder, wrist is unremarkable.  Radial pulse intact.  Sensation intact all 5 digits. Neurologic:  Normal speech and language. No gross focal neurologic deficits are appreciated.  Skin:  Skin is warm, dry and intact. No rash noted.  No lacerations noted to right forearm.  Main laceration is avulsion type laceration with missing tissue.  Edges show signs of granulation tissue consistent with given age of injury.  No purulent drainage.  No visible necrosis.  No significant erythema or edema.  Proximal to this laceration, patient has a linear laceration.  Edges are well closed together.  No bleeding or foreign body.  No purulent drainage.  No surrounding erythema.  Edges appear several days old. Psychiatric: Mood and affect are normal. Speech and behavior are normal. Patient exhibits appropriate insight and  judgement.   ____________________________________________   LABS (all labs ordered are listed, but only abnormal results are displayed)  Labs Reviewed - No data to display ____________________________________________  EKG   ____________________________________________  RADIOLOGY I personally viewed and evaluated these images as part of my medical decision making, as well as reviewing the written report by the radiologist.  I concur with radiologist finding of no acute osseous abnormality to the elbow or forearm.  No visible foreign body and soft tissue underlying laceration.  Dg Elbow Complete Right  Result Date: 05/15/2018 CLINICAL DATA:  Right arm pain/laceration EXAM: RIGHT ELBOW - COMPLETE 3+ VIEW COMPARISON:  None. FINDINGS: No fracture or dislocation is seen. The joint spaces are preserved. Soft tissue laceration along the ventral aspect of the proximal forearm. No radiopaque foreign body is seen. IMPRESSION: Soft tissue laceration along the ventral aspect of the proximal forearm. No fracture, dislocation, or  radiopaque foreign body is seen. Electronically Signed   By: Julian Hy M.D.   On: 05/15/2018 15:53   Dg Forearm Right  Result Date: 05/15/2018 CLINICAL DATA:  Arm pain/injury EXAM: RIGHT FOREARM - 2 VIEW COMPARISON:  None. FINDINGS: No fracture or dislocation is seen. The joint spaces are preserved. Soft tissue laceration along the ventral aspect of the proximal forearm. No radiopaque foreign body is seen. IMPRESSION: Soft tissue laceration along the ventral aspect of the proximal forearm. No fracture, dislocation, or radiopaque foreign body is seen. Electronically Signed   By: Julian Hy M.D.   On: 05/15/2018 15:55    ____________________________________________    PROCEDURES  Procedure(s) performed:    Procedures    Medications  Tdap (BOOSTRIX) injection 0.5 mL (0.5 mLs Intramuscular Given 05/15/18 1606)  neomycin-bacitracin-polymyxin (NEOSPORIN)  ointment (1 application Topical Given 05/15/18 1605)     ____________________________________________   INITIAL IMPRESSION / ASSESSMENT AND PLAN / ED COURSE  Pertinent labs & imaging results that were available during my care of the patient were reviewed by me and considered in my medical decision making (see chart for details).  Review of the [place] CSRS was performed in accordance of the [place] prior to dispensing any controlled drugs.      Patient's diagnosis is consistent with laceration, hematoma of the right arm.  Patient presents emergency department after sustaining an injury several days prior.  Avulsion type laceration is appreciated as well as a healing linear laceration.  Significant ecchymosis and tenderness is also present on exam.  Differential included hematoma, fracture, ligament rupture, cellulitis, healing laceration.  Overall, exam is reassuring with no signs of infection or acute ligament rupture..  X-rays revealed no acute osseous abnormality or retained foreign body.  Tetanus shot is updated today.. Patient will be discharged home with prescriptions for Keflex prophylactically, meloxicam for symptom improvement.. Patient is to follow up with primary care as needed or otherwise directed. Patient is given ED precautions to return to the ED for any worsening or new symptoms.     ____________________________________________  FINAL CLINICAL IMPRESSION(S) / ED DIAGNOSES  Final diagnoses:  Laceration of right upper extremity, initial encounter  Traumatic hematoma of right forearm, initial encounter      NEW MEDICATIONS STARTED DURING THIS VISIT:  ED Discharge Orders         Ordered    cephALEXin (KEFLEX) 500 MG capsule  2 times daily     05/15/18 1616    meloxicam (MOBIC) 15 MG tablet  Daily     05/15/18 1616              This chart was dictated using voice recognition software/Dragon. Despite best efforts to proofread, errors can occur which can change the  meaning. Any change was purely unintentional.    Darletta Moll, PA-C 05/15/18 1625    Lavonia Drafts, MD 05/15/18 [phone]

## 2018-05-15 NOTE — ED Triage Notes (Signed)
Pt to ED via POV, pt states that she was playing with her daughter 3-4 days ago and she cut her right arm on the wall or the bed frame. Pt states that the arms is still hurting. Pt is currently in NAD.

## 2018-05-15 NOTE — ED Notes (Signed)

## 2018-05-15 NOTE — ED Notes (Signed)
See triage note. Pt c/o pain to R arm following rough housing with child 4 days ago. Bruising noted. Open area noted to forearm, pt reports yellow-ish drainage.

## 2018-08-12 ENCOUNTER — Other Ambulatory Visit: Payer: Self-pay

## 2018-08-12 ENCOUNTER — Emergency Department
Admission: EM | Admit: 2018-08-12 | Discharge: 2018-08-12 | Disposition: A | Discharge status: Home / Self Care | Payer: Medicaid Other | Attending: Emergency Medicine | Admitting: Emergency Medicine

## 2018-08-12 ENCOUNTER — Encounter: Payer: Self-pay | Admitting: Emergency Medicine

## 2018-08-12 ENCOUNTER — Emergency Department: Payer: Medicaid Other

## 2018-08-12 DIAGNOSIS — F17210 Nicotine dependence, cigarettes, uncomplicated: Secondary | ICD-10-CM | POA: Insufficient documentation

## 2018-08-12 DIAGNOSIS — I492 Junctional premature depolarization: Secondary | ICD-10-CM | POA: Insufficient documentation

## 2018-08-12 DIAGNOSIS — Z9049 Acquired absence of other specified parts of digestive tract: Secondary | ICD-10-CM | POA: Insufficient documentation

## 2018-08-12 DIAGNOSIS — J209 Acute bronchitis, unspecified: Secondary | ICD-10-CM | POA: Insufficient documentation

## 2018-08-12 DIAGNOSIS — I498 Other specified cardiac arrhythmias: Secondary | ICD-10-CM

## 2018-08-12 LAB — BASIC METABOLIC PANEL
Anion gap: 8 (ref 5–15)
BUN: 6 mg/dL (ref 6–20)
CO2: 28 mmol/L (ref 22–32)
Calcium: 9 mg/dL (ref 8.9–10.3)
Chloride: 104 mmol/L (ref 98–111)
Creatinine, Ser: 0.75 mg/dL (ref 0.44–1.00)
GFR calc Af Amer: >60 mL/min (ref >60)
GLUCOSE: 97 mg/dL (ref 70–99)
POTASSIUM: 3.7 mmol/L (ref 3.5–5.1)
Sodium: 140 mmol/L (ref 135–145)

## 2018-08-12 LAB — CBC
HCT: 35.7 % — ABNORMAL LOW (ref 36.0–46.0)
HEMOGLOBIN: 11.1 g/dL — AB (ref 12.0–15.0)
MCH: 29.6 pg (ref 26.0–34.0)
MCHC: 31.1 g/dL (ref 30.0–36.0)
MCV: 95.2 fL (ref 80.0–100.0)
Platelets: 245 10*3/uL (ref 150–400)
RBC: 3.75 MIL/uL — ABNORMAL LOW (ref 3.87–5.11)
RDW: 22.5 % — ABNORMAL HIGH (ref 11.5–15.5)
WBC: 5.3 10*3/uL (ref 4.0–10.5)
nRBC: 0 % (ref 0.0–0.2)

## 2018-08-12 LAB — TROPONIN I

## 2018-08-12 MED ORDER — ALBUTEROL SULFATE HFA 108 (90 BASE) MCG/ACT IN AERS: 2.0000 | INHALATION_SPRAY | RESPIRATORY_TRACT | 0 refills | Status: DC | PRN

## 2018-08-12 MED ORDER — PSEUDOEPH-BROMPHEN-DM 30-2-10 MG/5ML PO SYRP: 10.0000 mL | ORAL_SOLUTION | Freq: Four times a day (QID) | ORAL | 0 refills | Status: DC | PRN

## 2018-08-12 MED ORDER — PREDNISONE 50 MG PO TABS: 50.0000 mg | ORAL_TABLET | Freq: Every day | ORAL | 0 refills | Status: DC

## 2018-08-12 NOTE — ED Triage Notes (Signed)
Patient reports non-productive cough and chest pressure x4 days. Unsure of fever but reports chills and sweating. Patient reports mother who lives with her is exhibiting similar symptoms.

## 2018-08-12 NOTE — ED Provider Notes (Signed)
[facility] Emergency Department Provider Note  ____________________________________________  Time seen: Approximately 3:36 PM  I have reviewed the triage vital signs and the nursing notes.   HISTORY  Chief Complaint Cough and Chest Pain    HPI Katrina Page is a 37 y.o. female who presents the emergency department complaining of 4-day history of nasal congestion, coughing, chest pain.  Patient reports that symptoms began with cough, then progressed to include nasal congestion and chest pain.  Patient reports that the cough is nonproductive in nature.  No fevers or chills, sore throat, ear pain.  Patient reports that the chest pain is not constant, is described as a tightness to the anterior aspect of the chest.  Patient denies any palpitations with this complaint.  No history of cardiac issues.  Patient has taken DayQuil, NyQuil, Mucinex with no relief of symptoms.  Patient denies any headaches, neck pain, abdominal pain, nausea vomiting, diarrhea or constipation.  Patient with no chronic medical problems.  No daily medications.    History reviewed. No pertinent past medical history.  There are no active problems to display for this patient.   Past Surgical History:  Procedure Laterality Date  . CHOLECYSTECTOMY    . TUBAL LIGATION      Prior to Admission medications   Medication Sig Start Date End Date Taking? Authorizing Provider  albuterol (PROVENTIL HFA;VENTOLIN HFA) 108 (90 Base) MCG/ACT inhaler Inhale 2 puffs into the lungs every 4 (four) hours as needed for wheezing or shortness of breath. 08/12/18   Debbe Crumble, Charline Bills, PA-C  brompheniramine-pseudoephedrine-DM 30-2-10 MG/5ML syrup Take 10 mLs by mouth 4 (four) times daily as needed. 08/12/18   Kattie Santoyo, Charline Bills, PA-C  cephALEXin (KEFLEX) 500 MG capsule Take 2 capsules (1,000 mg total) by mouth 2 (two) times daily. 05/15/18   Reginae Wolfrey, Charline Bills, PA-C  HYDROcodone-acetaminophen  (NORCO/VICODIN) 5-325 MG tablet Take 1 tablet by mouth every 6 (six) hours as needed for moderate pain. 05/18/17   Johnn Hai, PA-C  meloxicam (MOBIC) 15 MG tablet Take 1 tablet (15 mg total) by mouth daily. 05/15/18   Ariah Mower, Charline Bills, PA-C  metoCLOPramide (REGLAN) 10 MG tablet Take 1 tablet (10 mg total) by mouth every 8 (eight) hours as needed. 04/04/18   Loney Hering, MD  oxyCODONE-acetaminophen (ROXICET) 5-325 MG tablet Take 1 tablet by mouth every 6 (six) hours as needed for severe pain. 08/17/17   Darel Hong, MD  predniSONE (DELTASONE) 50 MG tablet Take 1 tablet (50 mg total) by mouth daily with breakfast. 08/12/18   Zenovia Justman, Charline Bills, PA-C    Allergies Patient has no known allergies.  No family history on file.  Social History Social History   Tobacco Use  . Smoking status: Current Every Day Smoker    Packs/day: 0.50    Types: Cigarettes  . Smokeless tobacco: Never Used  Substance Use Topics  . Alcohol use: No  . Drug use: No     Review of Systems  Constitutional: No fever/chills Eyes: No visual changes. No discharge ENT: Positive for nasal congestion Cardiovascular: Positive chest pain. Respiratory: Positive for nonproductive cough. No SOB. Gastrointestinal: No abdominal pain.  No nausea, no vomiting.  No diarrhea.  No constipation. Musculoskeletal: Negative for musculoskeletal pain. Skin: Negative for rash, abrasions, lacerations, ecchymosis. Neurological: Negative for headaches, focal weakness or numbness. 10-point ROS otherwise negative.  ____________________________________________   PHYSICAL EXAM:  VITAL SIGNS: ED Triage Vitals  Enc Vitals Group     BP 08/12/18 1250 Marland Kitchen)  144/130     Pulse Rate 08/12/18 1250 82     Resp 08/12/18 1250 18     Temp 08/12/18 1250 97.7 F (36.5 C)     Temp src --      SpO2 08/12/18 1250 99 %     Weight 08/12/18 1301 250 lb (113.4 kg)     Height 08/12/18 1301 5\' 9"  (1.753 m)     Head Circumference  --      Peak Flow --      Pain Score 08/12/18 1300 9     Pain Loc --      Pain Edu? --      Excl. in [place]? --      Constitutional: Alert and oriented. Well appearing and in no acute distress. Eyes: Conjunctivae are normal. PERRL. EOMI. Head: Atraumatic. ENT:      Ears: EACs and TMs unremarkable bilaterally.      Nose: Moderate clear congestion/rhinnorhea.      Mouth/Throat: Mucous membranes are moist.  Oropharynx is not erythematous and nonedematous.  Uvula is midline. Neck: No stridor.  Neck is supple full range of motion Hematological/Lymphatic/Immunilogical: No cervical lymphadenopathy. Cardiovascular: Normal rate, regular rhythm. Normal S1 and S2.  No murmurs, rubs, gallops.  Good peripheral circulation. Respiratory: Normal respiratory effort without tachypnea or retractions. Lungs with mild expiratory wheezes in bilateral lower lung fields.  No rales or rhonchi.Kermit Balo air entry to the bases with no decreased or absent breath sounds. Musculoskeletal: Full range of motion to all extremities. No gross deformities appreciated. Neurologic:  Normal speech and language. No gross focal neurologic deficits are appreciated.  Skin:  Skin is warm, dry and intact. No rash noted. Psychiatric: Mood and affect are normal. Speech and behavior are normal. Patient exhibits appropriate insight and judgement.   ____________________________________________   LABS (all labs ordered are listed, but only abnormal results are displayed)  Labs Reviewed  CBC - Abnormal; Notable for the following components:      Result Value   RBC 3.75 (*)    Hemoglobin 11.1 (*)    HCT 35.7 (*)    RDW 22.5 (*)    All other components within normal limits  BASIC METABOLIC PANEL  TROPONIN I   ____________________________________________  EKG  ED ECG REPORT I, Charline Bills Faylynn Stamos,  personally viewed and interpreted this ECG.   Date: 08/12/2018  EKG Time: 1248 hrs.  Rate: 82 bpm  Rhythm: Accelerated  junctional rhythm, change from previous EKG.  Axis: Normal axis  Intervals:none  ST&T Change: No ST elevations or depressions noted  Accelerated junctional rhythm.  No STEMI.  ____________________________________________  RADIOLOGY I personally viewed and evaluated these images as part of my medical decision making, as well as reviewing the written report by the radiologist.  Dg Chest 2 View  Result Date: 08/12/2018 CLINICAL DATA:  Nonproductive cough. EXAM: CHEST - 2 VIEW COMPARISON:  08/17/2017. FINDINGS: Mediastinum and hilar structures normal. Mild peribronchial cuffing noted. Mild bronchitis could present this fashion. No focal alveolar infiltrate. No pleural effusion or pneumothorax. Heart size normal. Surgical clips right upper quadrant. No acute bony abnormality. IMPRESSION: Mild bilateral peribronchial cuffing. Mild bronchitis could present in this fashion. Electronically Signed   By: Marcello Moores  Register   On: 08/12/2018 13:25    ____________________________________________    PROCEDURES  Procedure(s) performed:    Procedures    Medications - No data to display   ____________________________________________   INITIAL IMPRESSION / ASSESSMENT AND PLAN / ED COURSE  Pertinent labs &  imaging results that were available during my care of the patient were reviewed by me and considered in my medical decision making (see chart for details).  Review of the [place] CSRS was performed in accordance of the [place] prior to dispensing any controlled drugs.  Clinical Course as of Aug 12 1604  Thu Aug 12, 2018  1556 Patient presented to the emergency department with URI symptoms as well as chest pain.  Patient reports that URI symptoms began first, followed by chest pain.  Findings on imaging, history are consistent with bronchitis.  Patient with no cardiac history.  No chronic medications.  Patient does have an accelerated junctional rhythm on EKG.  Patient takes no medications such as  beta-blockers, calcium channel blockers, digoxin or other antiarrhythmics that may cause this rhythm.  Patient does not have any findings or symptoms consistent with Endo/myo/pericarditis.  No ST elevation or depression or positive troponin to indicate ACS/MI.  Findings of accelerated junctional rhythm are new from previous EKG.  As symptoms are intermittent, starting after URI symptoms, I suspect that chest pain is most consistent with bronchitis.  I did discuss the findings with patient including likely causative factors to include stimulation of the vagus nerve from constant coughing, medication side effects, causes such as ACS/MI, infection of the heart to include endocarditis, myocarditis, pericarditis.  Patient verbalizes understanding of likely diagnosis of junctional rhythm secondary to vagus nerve stimulation from coughing.  However, after treatment for URI symptoms, if chest pain does not improve, patient is to follow-up with cardiology.  No indication for further work-up at this time.   [JC]    Clinical Course User Index [JC] Ersie Savino, Charline Bills, PA-C     Patient's diagnosis is consistent with bronchitis, accelerated junctional rhythm.  Patient presents the emergency department with several day history of URI symptoms.  Patient is URI symptoms began prior to chest pain.  Chest pain is intermittent, diffuse across the anterior chest.  No cardiac history.  Patient does have a previous EKG on record, this was normal sinus rhythm.  At this time, I feel that accelerated junctional rhythm is likely secondary to bronchitis with vagal nerve stimulation from constant coughing.  However, I discussed other concerning causes of accelerated junctional rhythm.  At this time, no indication for treatment of accelerated junctional rhythm.  No indication for further work-up.  Patient is advised that if chest pain continues after improvement of URI symptoms, she is to follow-up with cardiology.  Patient is advised  to return to the emergency department for any new, or changing chest pain symptoms.  Patient verbalizes understanding of same.. Patient will be discharged home with prescriptions for Bromfed cough syrup, albuterol inhaler, prednisone.. Patient is to follow up with cardiology or primary care as needed or otherwise directed. Patient is given ED precautions to return to the ED for any worsening or new symptoms.     ____________________________________________  FINAL CLINICAL IMPRESSION(S) / ED DIAGNOSES  Final diagnoses:  Acute bronchitis, unspecified organism  Accelerated junctional rhythm      NEW MEDICATIONS STARTED DURING THIS VISIT:  ED Discharge Orders         Ordered    brompheniramine-pseudoephedrine-DM 30-2-10 MG/5ML syrup  4 times daily PRN     08/12/18 1605    albuterol (PROVENTIL HFA;VENTOLIN HFA) 108 (90 Base) MCG/ACT inhaler  Every 4 hours PRN     08/12/18 1605    predniSONE (DELTASONE) 50 MG tablet  Daily with breakfast     08/12/18 1605  This chart was dictated using voice recognition software/Dragon. Despite best efforts to proofread, errors can occur which can change the meaning. Any change was purely unintentional.    Darletta Moll, PA-C 08/12/18 1605    Carrie Mew, MD 08/13/18 [phone]

## 2018-09-07 ENCOUNTER — Emergency Department
Admission: EM | Admit: 2018-09-07 | Discharge: 2018-09-07 | Disposition: A | Discharge status: Home / Self Care | Payer: Medicaid Other | Attending: Emergency Medicine | Admitting: Emergency Medicine

## 2018-09-07 ENCOUNTER — Encounter: Payer: Self-pay | Admitting: Emergency Medicine

## 2018-09-07 ENCOUNTER — Other Ambulatory Visit: Payer: Self-pay

## 2018-09-07 ENCOUNTER — Emergency Department: Payer: Medicaid Other

## 2018-09-07 DIAGNOSIS — W010XXA Fall on same level from slipping, tripping and stumbling without subsequent striking against object, initial encounter: Secondary | ICD-10-CM | POA: Insufficient documentation

## 2018-09-07 DIAGNOSIS — Y998 Other external cause status: Secondary | ICD-10-CM | POA: Insufficient documentation

## 2018-09-07 DIAGNOSIS — S8391XA Sprain of unspecified site of right knee, initial encounter: Secondary | ICD-10-CM | POA: Insufficient documentation

## 2018-09-07 DIAGNOSIS — Y92002 Bathroom of unspecified non-institutional (private) residence single-family (private) house as the place of occurrence of the external cause: Secondary | ICD-10-CM | POA: Insufficient documentation

## 2018-09-07 DIAGNOSIS — F17210 Nicotine dependence, cigarettes, uncomplicated: Secondary | ICD-10-CM | POA: Insufficient documentation

## 2018-09-07 DIAGNOSIS — S93402A Sprain of unspecified ligament of left ankle, initial encounter: Secondary | ICD-10-CM | POA: Insufficient documentation

## 2018-09-07 DIAGNOSIS — Y9389 Activity, other specified: Secondary | ICD-10-CM | POA: Insufficient documentation

## 2018-09-07 MED ORDER — TRAMADOL HCL 50 MG PO TABS
50.0000 mg | ORAL_TABLET | Freq: Once | ORAL | Status: AC
  Administered 2018-09-07: 50 mg via ORAL
  Filled 2018-09-07: qty 1

## 2018-09-07 MED ORDER — CYCLOBENZAPRINE HCL 5 MG PO TABS: 5.0000 mg | ORAL_TABLET | Freq: Three times a day (TID) | ORAL | 0 refills | Status: DC | PRN

## 2018-09-07 MED ORDER — NAPROXEN 500 MG PO TABS
500.0000 mg | ORAL_TABLET | Freq: Once | ORAL | Status: AC
  Administered 2018-09-07: 500 mg via ORAL
  Filled 2018-09-07: qty 1

## 2018-09-07 MED ORDER — NABUMETONE 750 MG PO TABS: 750.0000 mg | ORAL_TABLET | Freq: Two times a day (BID) | ORAL | 0 refills | Status: DC

## 2018-09-07 NOTE — ED Provider Notes (Signed)
[facility] Emergency Department Provider Note ____________________________________________  Time seen: 1644  I have reviewed the triage vital signs and the nursing notes.  HISTORY  Chief Complaint  Fall  HPI Katrina Page is a 37 y.o. female who presents to the ED for evaluation of right knee and left ankle pain.  Patient describes a mechanical fall last night after she awoke to go to the bathroom.  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  This caused her to twist her left ankle and landed on her right knee within the bathroom or bedroom.  She denies any head injury, loss of consciousness, nausea, vomiting, or dizziness.  She has had increased pain and disability to the right knee since the fall.  She also notes some lateral ankle pain and swelling.  She denies any history of chronic or ongoing joint pains.  She presents today for left ankle pain and right knee pain with disability.  She describes swelling to the knee and decreased range of motion secondary to pain.  History reviewed. No pertinent past medical history.  There are no active problems to display for this patient.   Past Surgical History:  Procedure Laterality Date  . CHOLECYSTECTOMY    . TUBAL LIGATION      Prior to Admission medications   Medication Sig Start Date End Date Taking? Authorizing Provider  albuterol (PROVENTIL HFA;VENTOLIN HFA) 108 (90 Base) MCG/ACT inhaler Inhale 2 puffs into the lungs every 4 (four) hours as needed for wheezing or shortness of breath. 08/12/18   Cuthriell, Charline Bills, PA-C    Allergies Patient has no known allergies.  History reviewed. No pertinent family history.  Social History Social History   Tobacco Use  . Smoking status: Current Every Day Smoker    Packs/day: 0.50    Types: Cigarettes  . Smokeless tobacco: Never Used  Substance Use Topics  . Alcohol  use: No  . Drug use: No    Review of Systems  Constitutional: Negative for fever. Cardiovascular: Negative for chest pain. Respiratory: Negative for shortness of breath. Musculoskeletal: Negative for back pain. Right knee and left ankle pain & disability Skin: Negative for rash. Neurological: Negative for headaches, focal weakness or numbness. ____________________________________________  PHYSICAL EXAM:  VITAL SIGNS: ED Triage Vitals  Enc Vitals Group     BP 09/07/18 1603 (!) 141/85     Pulse Rate 09/07/18 1603 89     Resp 09/07/18 1603 18     Temp 09/07/18 1603 98.5 F (36.9 C)     Temp src --      SpO2 09/07/18 1603 99 %     Weight 09/07/18 1603 300 lb (136.1 kg)     Height 09/07/18 1603 5\' 9"  (1.753 m)     Head Circumference --      Peak Flow --      Pain Score 09/07/18 1603 10     Pain Loc --      Pain Edu? --      Excl. in [place]? --     Constitutional: Alert and oriented. Well appearing and in no distress. Head: Normocephalic and atraumatic. Eyes: Conjunctivae are normal. Normal extraocular movements Cardiovascular: Normal rate, regular rhythm. Normal distal pulses. Respiratory: Normal respiratory effort. No wheezes/rales/rhonchi. Musculoskeletal: right knee with moderate effusion noted. No obvious deformity, dislocation. Patient with self-limited ROM, holding the knee in a flexed position. She is tender  to palp over the medial joint. No popliteal space fullness or calf/achilles tenderness noted. The left ankle without deformity, ecchymosis, or effusion. Lateral soft tissue swelling is noted. Normal ankle ROM with negative anterior/posterior drawer. No achilles tenderness. Nontender with normal range of motion in all extremities.  Neurologic:  Normal gait without ataxia. Normal speech and language. No gross focal neurologic deficits are appreciated. Skin:  Skin is warm, dry and intact. No rash noted. Psychiatric: Mood and affect are normal. Patient exhibits appropriate  insight and judgment. ____________________________________________   RADIOLOGY  Right Knee IMPRESSION: No acute fracture. There may be a tiny amount of joint fluid present  Left Ankle IMPRESSION: Negative. ____________________________________________  PROCEDURES  Procedures Ultram 50 mg PO Naproxen 500 mg PO Knee immobilizer Ace bandage - knee/ankle ____________________________________________  INITIAL IMPRESSION / ASSESSMENT AND PLAN / ED COURSE  Patient with ED evaluation of injuries following a mechanical fall at home last night. She sustained right knee and left ankle pain. Her XRs are negative. She is treated for right knee sprain with effusion. She will be referred to ortho for ongoing symptoms. Prescriptions for Relafen and Flexeril are provided.  ____________________________________________  FINAL CLINICAL IMPRESSION(S) / ED DIAGNOSES  Final diagnoses:  Sprain of right knee, unspecified ligament, initial encounter  Sprain of left ankle, unspecified ligament, initial encounter      Melvenia Needles, PA-C 09/07/18 1719    Orbie Pyo, MD 09/07/18 2042

## 2018-09-07 NOTE — Discharge Instructions (Addendum)
Your exam and x-ray are consistent with a sprain with effusion to the knee and an ankle sprain. Wear the knee immobilizer and ace bandage for support, when walking. Rest with the leg elevated when seated. Apply ice to reduce swelling. Take the prescription meds as directed. Follow-up with Dr. Sabra Heck for ongoing symptoms.

## 2018-09-07 NOTE — ED Triage Notes (Signed)
Presents s/p fall last pm  States she twisted left ankle  And landed on right knee  Unable to bear full wt

## 2018-11-29 ENCOUNTER — Encounter: Payer: Self-pay | Admitting: Intensive Care

## 2018-11-29 ENCOUNTER — Inpatient Hospital Stay
Admission: EM | Admit: 2018-11-29 | Discharge: 2018-12-02 | DRG: 871 | Disposition: A | Discharge status: Home / Self Care | Payer: Self-pay | Attending: Internal Medicine | Admitting: Internal Medicine

## 2018-11-29 ENCOUNTER — Emergency Department: Payer: Self-pay

## 2018-11-29 ENCOUNTER — Other Ambulatory Visit: Payer: Self-pay

## 2018-11-29 DIAGNOSIS — J180 Bronchopneumonia, unspecified organism: Secondary | ICD-10-CM | POA: Diagnosis present

## 2018-11-29 DIAGNOSIS — Z833 Family history of diabetes mellitus: Secondary | ICD-10-CM

## 2018-11-29 DIAGNOSIS — D638 Anemia in other chronic diseases classified elsewhere: Secondary | ICD-10-CM | POA: Diagnosis present

## 2018-11-29 DIAGNOSIS — E876 Hypokalemia: Secondary | ICD-10-CM | POA: Diagnosis present

## 2018-11-29 DIAGNOSIS — R091 Pleurisy: Secondary | ICD-10-CM | POA: Diagnosis present

## 2018-11-29 DIAGNOSIS — Z6832 Body mass index (BMI) 32.0-32.9, adult: Secondary | ICD-10-CM

## 2018-11-29 DIAGNOSIS — E669 Obesity, unspecified: Secondary | ICD-10-CM | POA: Diagnosis present

## 2018-11-29 DIAGNOSIS — J189 Pneumonia, unspecified organism: Secondary | ICD-10-CM

## 2018-11-29 DIAGNOSIS — Z8249 Family history of ischemic heart disease and other diseases of the circulatory system: Secondary | ICD-10-CM

## 2018-11-29 DIAGNOSIS — Z9049 Acquired absence of other specified parts of digestive tract: Secondary | ICD-10-CM

## 2018-11-29 DIAGNOSIS — F17210 Nicotine dependence, cigarettes, uncomplicated: Secondary | ICD-10-CM | POA: Diagnosis present

## 2018-11-29 DIAGNOSIS — Z79899 Other long term (current) drug therapy: Secondary | ICD-10-CM

## 2018-11-29 DIAGNOSIS — Z716 Tobacco abuse counseling: Secondary | ICD-10-CM

## 2018-11-29 DIAGNOSIS — A419 Sepsis, unspecified organism: Principal | ICD-10-CM | POA: Diagnosis present

## 2018-11-29 HISTORY — DX: Migraine, unspecified, not intractable, without status migrainosus: G43.909

## 2018-11-29 LAB — CBC
HCT: 35.5 % — ABNORMAL LOW (ref 36.0–46.0)
HEMOGLOBIN: 10.5 g/dL — AB (ref 12.0–15.0)
MCH: 26.9 pg (ref 26.0–34.0)
MCHC: 29.6 g/dL — AB (ref 30.0–36.0)
MCV: 90.8 fL (ref 80.0–100.0)
PLATELETS: 190 10*3/uL (ref 150–400)
RBC: 3.91 MIL/uL (ref 3.87–5.11)
RDW: 21.7 % — ABNORMAL HIGH (ref 11.5–15.5)
WBC: 14.2 10*3/uL — AB (ref 4.0–10.5)
nRBC: 0 % (ref 0.0–0.2)

## 2018-11-29 LAB — BASIC METABOLIC PANEL
Anion gap: 11 (ref 5–15)
BUN: 9 mg/dL (ref 6–20)
CALCIUM: 8.6 mg/dL — AB (ref 8.9–10.3)
CO2: 25 mmol/L (ref 22–32)
Chloride: 99 mmol/L (ref 98–111)
Creatinine, Ser: 0.9 mg/dL (ref 0.44–1.00)
GFR calc Af Amer: >60 mL/min (ref >60)
GLUCOSE: 122 mg/dL — AB (ref 70–99)
POTASSIUM: 3.4 mmol/L — AB (ref 3.5–5.1)
Sodium: 135 mmol/L (ref 135–145)

## 2018-11-29 LAB — LACTIC ACID, PLASMA: Lactic Acid, Venous: 1 mmol/L (ref 0.5–1.9)

## 2018-11-29 LAB — TROPONIN I

## 2018-11-29 MED ORDER — SODIUM CHLORIDE 0.9 % IV BOLUS
1000.0000 mL | Freq: Once | INTRAVENOUS | Status: AC
  Administered 2018-11-29: 1000 mL via INTRAVENOUS

## 2018-11-29 MED ORDER — ACETAMINOPHEN 325 MG PO TABS: 650.0000 mg | ORAL_TABLET | Freq: Four times a day (QID) | ORAL | Status: DC | PRN

## 2018-11-29 MED ORDER — SENNOSIDES-DOCUSATE SODIUM 8.6-50 MG PO TABS: 1.0000 | ORAL_TABLET | Freq: Every evening | ORAL | Status: DC | PRN

## 2018-11-29 MED ORDER — CYCLOBENZAPRINE HCL 10 MG PO TABS
5.0000 mg | ORAL_TABLET | Freq: Three times a day (TID) | ORAL | Status: DC | PRN
  Administered 2018-11-30: 5 mg via ORAL
  Filled 2018-11-29: qty 1

## 2018-11-29 MED ORDER — SODIUM CHLORIDE 0.9 % IV SOLN
500.0000 mg | Freq: Once | INTRAVENOUS | Status: AC
  Administered 2018-11-29: 500 mg via INTRAVENOUS
  Filled 2018-11-29: qty 500

## 2018-11-29 MED ORDER — SODIUM CHLORIDE 0.9 % IV SOLN
2.0000 g | INTRAVENOUS | Status: DC
  Administered 2018-11-30 – 2018-12-01 (×2): 2 g via INTRAVENOUS
  Filled 2018-11-29: qty 20
  Filled 2018-11-29 (×2): qty 2

## 2018-11-29 MED ORDER — ENOXAPARIN SODIUM 40 MG/0.4ML ~~LOC~~ SOLN
40.0000 mg | SUBCUTANEOUS | Status: DC
  Administered 2018-11-30 – 2018-12-01 (×3): 40 mg via SUBCUTANEOUS
  Filled 2018-11-29 (×3): qty 0.4

## 2018-11-29 MED ORDER — BISACODYL 5 MG PO TBEC: 5.0000 mg | DELAYED_RELEASE_TABLET | Freq: Every day | ORAL | Status: DC | PRN

## 2018-11-29 MED ORDER — ACETAMINOPHEN 650 MG RE SUPP: 650.0000 mg | Freq: Four times a day (QID) | RECTAL | Status: DC | PRN

## 2018-11-29 MED ORDER — ONDANSETRON HCL 4 MG/2ML IJ SOLN
4.0000 mg | Freq: Four times a day (QID) | INTRAMUSCULAR | Status: DC | PRN
  Administered 2018-11-30: 4 mg via INTRAVENOUS
  Filled 2018-11-29: qty 2

## 2018-11-29 MED ORDER — SODIUM CHLORIDE 0.9 % IV BOLUS
500.0000 mL | Freq: Once | INTRAVENOUS | Status: AC
  Administered 2018-11-29: 500 mL via INTRAVENOUS

## 2018-11-29 MED ORDER — SODIUM CHLORIDE 0.9 % IV SOLN
1.0000 g | Freq: Once | INTRAVENOUS | Status: AC
  Administered 2018-11-29: 1 g via INTRAVENOUS
  Filled 2018-11-29: qty 10

## 2018-11-29 MED ORDER — NABUMETONE 500 MG PO TABS
750.0000 mg | ORAL_TABLET | Freq: Two times a day (BID) | ORAL | Status: DC | PRN
  Filled 2018-11-29: qty 2

## 2018-11-29 MED ORDER — MORPHINE SULFATE (PF) 4 MG/ML IV SOLN
4.0000 mg | Freq: Once | INTRAVENOUS | Status: AC
  Administered 2018-11-29: 4 mg via INTRAVENOUS
  Filled 2018-11-29: qty 1

## 2018-11-29 MED ORDER — IOPAMIDOL (ISOVUE-370) INJECTION 76%
100.0000 mL | Freq: Once | INTRAVENOUS | Status: AC | PRN
  Administered 2018-11-29: 100 mL via INTRAVENOUS

## 2018-11-29 MED ORDER — SODIUM CHLORIDE 0.9 % IV SOLN
500.0000 mg | INTRAVENOUS | Status: DC
  Administered 2018-11-30: 500 mg via INTRAVENOUS
  Filled 2018-11-29 (×2): qty 500

## 2018-11-29 MED ORDER — MORPHINE SULFATE (PF) 4 MG/ML IV SOLN
4.0000 mg | INTRAVENOUS | Status: DC | PRN
  Administered 2018-11-30 (×2): 4 mg via INTRAVENOUS
  Filled 2018-11-29 (×2): qty 1

## 2018-11-29 MED ORDER — IOHEXOL 350 MG/ML SOLN: 75.0000 mL | Freq: Once | INTRAVENOUS | Status: DC | PRN

## 2018-11-29 MED ORDER — HYDROMORPHONE HCL 1 MG/ML IJ SOLN
1.0000 mg | Freq: Once | INTRAMUSCULAR | Status: AC
  Administered 2018-11-29: 1 mg via INTRAVENOUS
  Filled 2018-11-29: qty 1

## 2018-11-29 MED ORDER — NICOTINE 14 MG/24HR TD PT24
14.0000 mg | MEDICATED_PATCH | Freq: Every day | TRANSDERMAL | Status: DC
  Administered 2018-12-02: 14 mg via TRANSDERMAL
  Filled 2018-11-29 (×2): qty 1

## 2018-11-29 MED ORDER — HYDROCODONE-ACETAMINOPHEN 5-325 MG PO TABS
1.0000 | ORAL_TABLET | ORAL | Status: DC | PRN
  Administered 2018-11-30 (×2): 2 via ORAL
  Administered 2018-12-01 (×2): 1 via ORAL
  Administered 2018-12-01: 2 via ORAL
  Administered 2018-12-02: 1 via ORAL
  Filled 2018-11-29: qty 2
  Filled 2018-11-29: qty 1
  Filled 2018-11-29 (×2): qty 2
  Filled 2018-11-29 (×3): qty 1

## 2018-11-29 MED ORDER — ONDANSETRON HCL 4 MG PO TABS: 4.0000 mg | ORAL_TABLET | Freq: Four times a day (QID) | ORAL | Status: DC | PRN

## 2018-11-29 MED ORDER — GUAIFENESIN-DM 100-10 MG/5ML PO SYRP
5.0000 mL | ORAL_SOLUTION | ORAL | Status: DC | PRN
  Administered 2018-11-30 – 2018-12-01 (×3): 5 mL via ORAL
  Filled 2018-11-29 (×4): qty 5

## 2018-11-29 MED ORDER — ALBUTEROL SULFATE (2.5 MG/3ML) 0.083% IN NEBU: 2.5000 mg | INHALATION_SOLUTION | RESPIRATORY_TRACT | Status: DC | PRN

## 2018-11-29 MED ORDER — OXYCODONE-ACETAMINOPHEN 5-325 MG PO TABS
1.0000 | ORAL_TABLET | ORAL | Status: AC | PRN
  Administered 2018-11-29 (×2): 1 via ORAL
  Filled 2018-11-29 (×2): qty 1

## 2018-11-29 NOTE — H&P (Signed)
[facility] [place] NAME: Katrina Page    MR#:  [id number]  DATE OF BIRTH:  12/18/1980  DATE OF ADMISSION:  11/29/2018  PRIMARY CARE PHYSICIAN: Patient, No Pcp Per   REQUESTING/REFERRING PHYSICIAN: Dr. Burlene Arnt.  CHIEF COMPLAINT:   Chief Complaint  Patient presents with  . Chest Pain   Right-sided chest pain, cough today. HISTORY OF PRESENT ILLNESS:  Katrina Page  is a 38 y.o. female with a known history of migraine.  The patient presented to ED with above chief complaints.  She was fine until today, she started to have right-sided chest pain, which is sharp, exacerbated with breathing without radiation.  She denies any fever, chills, but she has cough.  She said that she has been few not good for period of time.  She has tachycardia, tachypnea and leukocytosis.  Chest x-ray report right middle lobe bronchopneumonia. PAST MEDICAL HISTORY:   Past Medical History:  Diagnosis Date  . Migraines     PAST SURGICAL HISTORY:   Past Surgical History:  Procedure Laterality Date  . CHOLECYSTECTOMY    . TUBAL LIGATION      SOCIAL HISTORY:   Social History   Tobacco Use  . Smoking status: Current Every Day Smoker    Packs/day: 0.50    Types: Cigarettes  . Smokeless tobacco: Never Used  Substance Use Topics  . Alcohol use: No    FAMILY HISTORY:   Family History  Problem Relation Age of Onset  . Hypertension Mother   . Diabetes Mother   . Stroke Mother   . Breast cancer Mother     DRUG ALLERGIES:  No Known Allergies  REVIEW OF SYSTEMS:   Review of Systems  Constitutional: Positive for chills. Negative for fever and malaise/fatigue.  HENT: Negative for sore throat.   Eyes: Negative for blurred vision and double vision.  Respiratory: Positive for cough and sputum production. Negative for hemoptysis, shortness of breath, wheezing and stridor.   Cardiovascular: Positive for chest pain. Negative for palpitations, orthopnea  and leg swelling.  Gastrointestinal: Negative for abdominal pain, blood in stool, diarrhea, melena, nausea and vomiting.  Genitourinary: Negative for dysuria, flank pain and hematuria.  Musculoskeletal: Negative for back pain and joint pain.  Skin: Negative for rash.  Neurological: Negative for dizziness, sensory change, focal weakness, seizures, loss of consciousness, weakness and headaches.  Endo/Heme/Allergies: Negative for polydipsia.  Psychiatric/Behavioral: Negative for depression. The patient is not nervous/anxious.     MEDICATIONS AT HOME:   Prior to Admission medications   Medication Sig Start Date End Date Taking? Authorizing Provider  acetaminophen (TYLENOL) 325 MG tablet Take 650 mg by mouth every 6 (six) hours as needed.   Yes [provider]  albuterol (PROVENTIL HFA;VENTOLIN HFA) 108 (90 Base) MCG/ACT inhaler Inhale 2 puffs into the lungs every 4 (four) hours as needed for wheezing or shortness of breath. 08/12/18  Yes Cuthriell, Charline Bills, PA-C  cyclobenzaprine (FLEXERIL) 5 MG tablet Take 1 tablet (5 mg total) by mouth 3 (three) times daily as needed for muscle spasms. 09/07/18  Yes Menshew, Dannielle Karvonen, PA-C  nabumetone (RELAFEN) 750 MG tablet Take 1 tablet (750 mg total) by mouth 2 (two) times daily. 09/07/18  Yes Menshew, Dannielle Karvonen, PA-C      VITAL SIGNS:  Blood pressure (!) 156/77, pulse (!) 113, temperature 99.1 F (37.3 C), temperature source Oral, resp. rate (!) 27, height 5\' 9"  (1.753 m), weight 129.3 kg, last  menstrual period 11/29/2018, SpO2 98 %.  PHYSICAL EXAMINATION:  Physical Exam  GENERAL:  38 y.o.-year-old patient lying in the bed with no acute distress.  Morbid obesity. EYES: Pupils equal, round, reactive to light and accommodation. No scleral icterus. Extraocular muscles intact.  HEENT: Head atraumatic, normocephalic. Oropharynx and nasopharynx clear.  NECK:  Supple, no jugular venous distention. No thyroid enlargement, no tenderness.   LUNGS: Normal breath sounds bilaterally, no wheezing, rales,rhonchi or crepitation. No use of accessory muscles of respiration.  CARDIOVASCULAR: S1, S2 normal. No murmurs, rubs, or gallops.  ABDOMEN: Soft, nontender, nondistended. Bowel sounds present. No organomegaly or mass.  EXTREMITIES: No pedal edema, cyanosis, or clubbing.  NEUROLOGIC: Cranial nerves II through XII are intact. Muscle strength 5/5 in all extremities. Sensation intact. Gait not checked.  PSYCHIATRIC: The patient is alert and oriented x 3.  SKIN: No obvious rash, lesion, or ulcer.   LABORATORY PANEL:   CBC Recent Labs  Lab 11/29/18 1730  WBC 14.2*  HGB 10.5*  HCT 35.5*  PLT 190   ------------------------------------------------------------------------------------------------------------------  Chemistries  Recent Labs  Lab 11/29/18 1730  NA 135  K 3.4*  CL 99  CO2 25  GLUCOSE 122*  BUN 9  CREATININE 0.90  CALCIUM 8.6*   ------------------------------------------------------------------------------------------------------------------  Cardiac Enzymes Recent Labs  Lab 11/29/18 1730  TROPONINI <0.03   ------------------------------------------------------------------------------------------------------------------  RADIOLOGY:  Dg Chest 2 View  Result Date: 11/29/2018 CLINICAL DATA:  Right-sided chest pain. EXAM: CHEST - 2 VIEW COMPARISON:  August 12, 2018 FINDINGS: Mild infiltrate in the right base. The heart, hila, mediastinum, lungs, and pleura are otherwise unremarkable. IMPRESSION: Findings suspicious for pneumonia in the right lung base. Recommend treatment with follow-up chest x-ray to ensure resolution. Electronically Signed   By: Dorise Bullion III M.D   On: 11/29/2018 18:26   Ct Angio Chest Pe W And/or Wo Contrast  Result Date: 11/29/2018 CLINICAL DATA:  38 year old female with severe right chest pain, pain radiating to the left neck and back. Onset this morning and progressive. EXAM: CT  ANGIOGRAPHY CHEST WITH CONTRAST TECHNIQUE: Multidetector CT imaging of the chest was performed using the standard protocol during bolus administration of intravenous contrast. Multiplanar CT image reconstructions and MIPs were obtained to evaluate the vascular anatomy. CONTRAST:  165mL ISOVUE-370 IOPAMIDOL (ISOVUE-370) INJECTION 76% COMPARISON:  Chest radiographs earlier today. CT Abdomen and Pelvis 11/27/2007. FINDINGS: Cardiovascular: Adequate contrast bolus timing in the pulmonary arterial tree. Intermittent respiratory motion. No central or hilar pulmonary artery filling defect. No convincing pulmonary embolus although right lower lobe branches are heterogeneous. No cardiomegaly or pericardial effusion. Negative visible aorta. Mediastinum/Nodes: Small reactive appearing subcarinal lymph nodes, mildly hyperenhancing. Negative mediastinum elsewhere. Lungs/Pleura: Major airways are patent. Patchy and confluent peribronchial opacity in both the right middle and lower lobes which is primarily nonenhancing. Trace superimposed right pleural effusion. Minor atelectasis superimposed in the medial right upper lobe. Similar minor atelectasis suspected in the lingula. Upper Abdomen: Negative visible liver, spleen, stomach. There is a small 14 millimeter low-density right adrenal nodule which is new since 2009 but most suggestive of benign adenoma. Musculoskeletal: No acute osseous abnormality identified. Review of the MIP images confirms the above findings. IMPRESSION: 1. Positive for right middle and lower lobe bronchopneumonia with trace right pleural effusion. Reactive appearing subcarinal lymph nodes. 2. No convincing acute pulmonary embolus.  Negative visible aorta. 3. Small benign right adrenal adenoma suspected. Electronically Signed   By: Genevie Ann M.D.   On: 11/29/2018 19:29  IMPRESSION AND PLAN:   Sepsis due to right bronchopneumonia, CAP. The patient will be admitted to medical floor. Continue  Zithromax and Rocephin, Robitussin as needed, nebulizer PRN. Pain control. Follow up with CBC and cultures.  Hypokalemia.  Potassium supplement.  Morbid obesity.  Exercise and diet control is advised.  Follow-up PCP. Check hemoglobin A1c.  Tobacco abuse.  Smoking cessation was counseled for 3 to 4 minutes, nicotine patch.  All the records are reviewed and case discussed with ED provider. Management plans discussed with the patient, family and they are in agreement.  CODE STATUS: Full code.  TOTAL TIME TAKING CARE OF THIS PATIENT: 36 minutes.    Demetrios Loll M.D on 11/29/2018 at 8:43 PM  Between 7am to 6pm - Pager - [phone]  After 6pm go to [email or web address] - Proofreader  [facility]  Office  [phone]  CC: Primary care physician; Patient, No Pcp Per   Note: This dictation was prepared with Dragon dictation along with smaller phrase technology. Any transcriptional errors that result from this process are unin

## 2018-11-29 NOTE — Progress Notes (Signed)
CODE SEPSIS - PHARMACY COMMUNICATION  **Broad Spectrum Antibiotics should be administered within 1 hour of Sepsis diagnosis**  Time Code Sepsis Called/Page Received: 2049  Antibiotics Ordered: azithromycin/ceftriaxone  Time of 1st antibiotic administration: 2009  Additional action taken by pharmacy:   If necessary, Name of Provider/Nurse Contacted:     Tobie Lords ,PharmD Clinical Pharmacist  11/29/2018  9:56 PM

## 2018-11-29 NOTE — ED Provider Notes (Addendum)
[facility] Emergency Department Provider Note  ____________________________________________   I have reviewed the triage vital signs and the nursing notes. Where available I have reviewed prior notes and, if possible and indicated, outside hospital notes.    HISTORY  Chief Complaint Chest Pain    HPI Katrina Page is a 38 y.o. female  Presents today complaining of right-sided chest pain.  Work-up of this morning.  Positional worse when she lies flat also worse when she touches her chest also has pain in the back but does not have pain radiating to the back, she states it hurts to breathe, pretty much anything she does makes it hurt.  It is a significant discomfort sharp.  No radiation.  Began this morning when she woke up and has been steadily there.  No other alleviating or aggravating factors.  Denies personal family history of PE.  No recent travel no recent surgery.  Has had her tubes tied denies pregnancy; refuses pregnancy test.  States that she has had a cough for "2 months", which is persistent.  Nonproductive.  No other pulmonary problems recently.  Denies fever.  Cough sometimes is productive.     Past Medical History:  Diagnosis Date  . Migraines     There are no active problems to display for this patient.   Past Surgical History:  Procedure Laterality Date  . CHOLECYSTECTOMY    . TUBAL LIGATION      Prior to Admission medications   Medication Sig Start Date End Date Taking? Authorizing Provider  albuterol (PROVENTIL HFA;VENTOLIN HFA) 108 (90 Base) MCG/ACT inhaler Inhale 2 puffs into the lungs every 4 (four) hours as needed for wheezing or shortness of breath. 08/12/18   Cuthriell, Charline Bills, PA-C  cyclobenzaprine (FLEXERIL) 5 MG tablet Take 1 tablet (5 mg total) by mouth 3 (three) times daily as needed for muscle spasms. 09/07/18   Menshew, Dannielle Karvonen, PA-C  nabumetone (RELAFEN) 750 MG tablet Take 1 tablet (750 mg total) by mouth 2  (two) times daily. 09/07/18   Menshew, Dannielle Karvonen, PA-C    Allergies Patient has no known allergies.  History reviewed. No pertinent family history.  Social History Social History   Tobacco Use  . Smoking status: Current Every Day Smoker    Packs/day: 0.50    Types: Cigarettes  . Smokeless tobacco: Never Used  Substance Use Topics  . Alcohol use: No  . Drug use: No    Review of Systems Constitutional: No fever/chills Eyes: No visual changes. ENT: No sore throat. No stiff neck no neck pain Cardiovascular: + chest pain. Respiratory: + shortness of breath. Gastrointestinal:   no vomiting.  No diarrhea.  No constipation. Genitourinary: Negative for dysuria. Musculoskeletal: Negative lower extremity swelling Skin: Negative for rash. Neurological: Negative for severe headaches, focal weakness or numbness.   ____________________________________________   PHYSICAL EXAM:  VITAL SIGNS: ED Triage Vitals [11/29/18 1727]  Enc Vitals Group     BP (!) 171/92     Pulse Rate (!) 120     Resp (!) 22     Temp 99.1 F (37.3 C)     Temp Source Oral     SpO2 99 %     Weight 285 lb (129.3 kg)     Height 5\' 9"  (1.753 m)     Head Circumference      Peak Flow      Pain Score 10     Pain Loc  Pain Edu?      Excl. in [place]?     Constitutional: Alert and oriented.  He is uncomfortable but nontoxic, clutching the right side of the chest wall. Eyes: Conjunctivae are normal Head: Atraumatic HEENT: No congestion/rhinnorhea. Mucous membranes are moist.  Oropharynx non-erythematous Neck:   Nontender with no meningismus, no masses, no stridor Cardiovascular: Tachycardia noted, rate with, regular rhythm. Grossly normal heart sounds.  Good peripheral circulation. Respiratory: Normal respiratory effort.  No retractions. Lungs CTAB. Chest: Tender palpation chest wall rotations.  Patient states "ouch that the pain right there", no crepitus no flail chest, Abdominal: Soft and nontender.  No distention. No guarding no rebound Back:  There is no focal tenderness or step off.  there is no midline tenderness there are no lesions noted. there is no CVA tenderness Musculoskeletal: No lower extremity tenderness, no upper extremity tenderness. No joint effusions, no DVT signs strong distal pulses no edema Neurologic:  Normal speech and language. No gross focal neurologic deficits are appreciated.  Skin:  Skin is warm, dry and intact. No rash noted. Psychiatric: Mood and affect are normal. Speech and behavior are normal.  ____________________________________________   LABS (all labs ordered are listed, but only abnormal results are displayed)  Labs Reviewed  BASIC METABOLIC PANEL - Abnormal; Notable for the following components:      Result Value   Potassium 3.4 (*)    Glucose, Bld 122 (*)    Calcium 8.6 (*)    All other components within normal limits  CBC - Abnormal; Notable for the following components:   WBC 14.2 (*)    Hemoglobin 10.5 (*)    HCT 35.5 (*)    MCHC 29.6 (*)    RDW 21.7 (*)    All other components within normal limits  TROPONIN I  POC URINE PREG, ED    Pertinent labs  results that were available during my care of the patient were reviewed by me and considered in my medical decision making (see chart for details). ____________________________________________  EKG  I personally interpreted any EKGs ordered by me or triage Line is tach no acute ST elevation or depression ____________________________________________  RADIOLOGY  Pertinent labs & imaging results that were available during my care of the patient were reviewed by me and considered in my medical decision making (see chart for details). If possible, patient and/or family made aware of any abnormal findings.  Dg Chest 2 View  Result Date: 11/29/2018 CLINICAL DATA:  Right-sided chest pain. EXAM: CHEST - 2 VIEW COMPARISON:  August 12, 2018 FINDINGS: Mild infiltrate in the right base. The  heart, hila, mediastinum, lungs, and pleura are otherwise unremarkable. IMPRESSION: Findings suspicious for pneumonia in the right lung base. Recommend treatment with follow-up chest x-ray to ensure resolution. Electronically Signed   By: Dorise Bullion III M.D   On: 11/29/2018 18:26   ____________________________________________    PROCEDURES  Procedure(s) performed: None  Procedures  Critical Care performed: CRITICAL CARE Performed by: Schuyler Amor   Total critical care time: 30 minutes  Critical care time was exclusive of separately billable procedures and treating other patients.  Critical care was necessary to treat or prevent imminent or life-threatening deterioration.  Critical care was time spent personally by me on the following activities: development of treatment plan with patient and/or surrogate as well as nursing, discussions with consultants, evaluation of patient's response to treatment, examination of patient, obtaining history from patient or surrogate, ordering and performing treatments and interventions, ordering and  review of laboratory studies, ordering and review of radiographic studies, pulse oximetry and re-evaluation of patient's condition.   ____________________________________________   INITIAL IMPRESSION / ASSESSMENT AND PLAN / ED COURSE  Pertinent labs & imaging results that were available during my care of the patient were reviewed by me and considered in my medical decision making (see chart for details).  Patient's cough apparently has morphed into a pneumonia there is no evidence of PE, but she does have multilobar pneumonia we will give her Rocephin and Zithromax no unity exposures to anything that I know of that likely would cause this to be something atypical but we will give her her usual coverage we will get cultures and given her tachycardia and extensive pain and her multiple lobe involvement she may benefit from an observational  admission  ----------------------------------------- 8:19 PM on 11/29/2018 -----------------------------------------  Patient still very uncomfortable still splinting, CT shows no PE but there is a multilobar pneumonia, we have given her morphine and she is still uncomfortable I will give her stronger pain medication.  Given this we will admit her.  This does cause some degree of tachycardia, which I think is clouding the picture in terms of sepsis clinically she does not appear to be septic however I have added a lactic, and admitted to the hospitalist.  Lactic acid is pending.  I think of and get her more comfortable she will have better vitals she is clearly having some degree of discomfort mediated tachycardia. She is admitted to dr. Bridgett Larsson at this time.  Nonetheless, I will continue to give her aggressive IV fluid here pending lactic.    ____________________________________________   FINAL CLINICAL IMPRESSION(S) / ED DIAGNOSES  Final diagnoses:  None      This chart was dictated using voice recognition software.  Despite best efforts to proofread,  errors can occur which can change meaning.      Schuyler Amor, MD 11/29/18 Jeananne Rama    Schuyler Amor, MD 11/29/18 2020    Schuyler Amor, MD 12/14/18 [phone]

## 2018-11-29 NOTE — ED Notes (Signed)
Chest pain worsens when laying flat and pt states that she cannot take a deep breath

## 2018-11-29 NOTE — ED Notes (Signed)
Pt not in room.

## 2018-11-29 NOTE — ED Notes (Signed)
ED TO INPATIENT HANDOFF REPORT  ED Nurse Name and Phone #: Avree Szczygiel [phone]   S Name/Age/Gender Katrina Page 38 y.o. female Room/Bed: ED12A/ED12A  Code Status   Code Status: Not on file  Home/SNF/Other Home Patient oriented to: self, place, time and situation Is this baseline? Yes   Triage Complete: Triage complete  Chief Complaint Chest Pain  Triage Note Patient c/o chest pain that radiates to L neck and back since this AM that has gradual gotten worse throughout the day   Allergies No Known Allergies  Level of Care/Admitting Diagnosis ED Disposition    ED Disposition Condition [place] [[id number]]  Level of Care: Med-Surg [16]  Diagnosis: Sepsis [facility]) [[id number]]  Admitting Physician: Demetrios Loll [[id number]]  Attending Physician: Demetrios Loll [[id number]]  Estimated length of stay: past midnight tomorrow  Certification:: I certify this patient will need inpatient services for at least 2 midnights  PT Class (Do Not Modify): Inpatient [101]  PT Acc Code (Do Not Modify): Private [1]       B Medical/Surgery History Past Medical History:  Diagnosis Date  . Migraines    Past Surgical History:  Procedure Laterality Date  . CHOLECYSTECTOMY    . TUBAL LIGATION       A IV Location/Drains/Wounds Patient Lines/Drains/Airways Status   Active Line/Drains/Airways    Name:   Placement date:   Placement time:   Site:   Days:   Peripheral IV 11/29/18 Left Antecubital   11/29/18    1847    Antecubital   less than 1   Peripheral IV 11/29/18 Right Hand   11/29/18    2054    Hand   less than 1          Intake/Output Last 24 hours No intake or output data in the 24 hours ending 11/29/18 2059  Labs/Imaging Results for orders placed or performed during the hospital encounter of 11/29/18 (from the past 48 hour(s))  Basic metabolic panel     Status: Abnormal   Collection Time: 11/29/18  5:30 PM  Result Value Ref Range   Sodium  135 135 - 145 mmol/L   Potassium 3.4 (L) 3.5 - 5.1 mmol/L   Chloride 99 98 - 111 mmol/L   CO2 25 22 - 32 mmol/L   Glucose, Bld 122 (H) 70 - 99 mg/dL   BUN 9 6 - 20 mg/dL   Creatinine, Ser 0.90 0.44 - 1.00 mg/dL   Calcium 8.6 (L) 8.9 - 10.3 mg/dL   GFR calc non Af Amer >60 >60 mL/min   GFR calc Af Amer >60 >60 mL/min   Anion gap 11 5 - 15    Comment: Performed at [facility], [address] [place], [place] [address]  CBC     Status: Abnormal   Collection Time: 11/29/18  5:30 PM  Result Value Ref Range   WBC 14.2 (H) 4.0 - 10.5 K/uL   RBC 3.91 3.87 - 5.11 MIL/uL   Hemoglobin 10.5 (L) 12.0 - 15.0 g/dL   HCT 35.5 (L) 36.0 - 46.0 %   MCV 90.8 80.0 - 100.0 fL   MCH 26.9 26.0 - 34.0 pg   MCHC 29.6 (L) 30.0 - 36.0 g/dL   RDW 21.7 (H) 11.5 - 15.5 %   Platelets 190 150 - 400 K/uL   nRBC 0.0 0.0 - 0.2 %    Comment: Performed at [facility], [address], [place], [place] [address]  Troponin I - ONCE - STAT     Status: None   Collection Time: 11/29/18  5:30 PM  Result Value Ref Range   Troponin I <0.03 <0.03 ng/mL    Comment: Performed at [facility], [address] [place], [place] [address]   Dg Chest 2 View  Result Date: 11/29/2018 CLINICAL DATA:  Right-sided chest pain. EXAM: CHEST - 2 VIEW COMPARISON:  August 12, 2018 FINDINGS: Mild infiltrate in the right base. The heart, hila, mediastinum, lungs, and pleura are otherwise unremarkable. IMPRESSION: Findings suspicious for pneumonia in the right lung base. Recommend treatment with follow-up chest x-ray to ensure resolution. Electronically Signed   By: Dorise Bullion III M.D   On: 11/29/2018 18:26   Ct Angio Chest Pe W And/or Wo Contrast  Result Date: 11/29/2018 CLINICAL DATA:  38 year old female with severe right chest pain, pain radiating to the left neck and back. Onset this morning and progressive. EXAM: CT ANGIOGRAPHY CHEST WITH CONTRAST TECHNIQUE: Multidetector CT imaging of the chest was  performed using the standard protocol during bolus administration of intravenous contrast. Multiplanar CT image reconstructions and MIPs were obtained to evaluate the vascular anatomy. CONTRAST:  171mL ISOVUE-370 IOPAMIDOL (ISOVUE-370) INJECTION 76% COMPARISON:  Chest radiographs earlier today. CT Abdomen and Pelvis 11/27/2007. FINDINGS: Cardiovascular: Adequate contrast bolus timing in the pulmonary arterial tree. Intermittent respiratory motion. No central or hilar pulmonary artery filling defect. No convincing pulmonary embolus although right lower lobe branches are heterogeneous. No cardiomegaly or pericardial effusion. Negative visible aorta. Mediastinum/Nodes: Small reactive appearing subcarinal lymph nodes, mildly hyperenhancing. Negative mediastinum elsewhere. Lungs/Pleura: Major airways are patent. Patchy and confluent peribronchial opacity in both the right middle and lower lobes which is primarily nonenhancing. Trace superimposed right pleural effusion. Minor atelectasis superimposed in the medial right upper lobe. Similar minor atelectasis suspected in the lingula. Upper Abdomen: Negative visible liver, spleen, stomach. There is a small 14 millimeter low-density right adrenal nodule which is new since 2009 but most suggestive of benign adenoma. Musculoskeletal: No acute osseous abnormality identified. Review of the MIP images confirms the above findings. IMPRESSION: 1. Positive for right middle and lower lobe bronchopneumonia with trace right pleural effusion. Reactive appearing subcarinal lymph nodes. 2. No convincing acute pulmonary embolus.  Negative visible aorta. 3. Small benign right adrenal adenoma suspected. Electronically Signed   By: Genevie Ann M.D.   On: 11/29/2018 19:29    Pending Labs Unresulted Labs (From admission, onward)    Start     Ordered   11/29/18 1941  Lactic acid, plasma  Now then every 2 hours,   STAT     11/29/18 1940   11/29/18 1940  Culture, blood (routine x 2)  BLOOD  CULTURE X 2,   STAT     11/29/18 1940   Signed and Held  HIV antibody (Routine Testing)  Once,   R     Signed and Held   Signed and Held  Basic metabolic panel  Tomorrow morning,   R     Signed and Held   Signed and Held  CBC  Tomorrow morning,   R     Signed and Held   Signed and Held  Creatinine, serum  (enoxaparin (LOVENOX)    CrCl >/= 30 ml/min)  Weekly,   R    Comments:  while on enoxaparin therapy    Signed and Held   Signed and Held  Hemoglobin A1c  Add-on,   R     Signed and Held  Signed and Held  Procalcitonin  ONCE - STAT,   R     Signed and Held   Signed and Held  Protime-INR  ONCE - STAT,   R     Signed and Held   Signed and Held  APTT  ONCE - STAT,   R     Signed and Held          Vitals/Pain Today's Vitals   11/29/18 1932 11/29/18 2009 11/29/18 2030 11/29/18 2054  BP:   (!) 183/164   Pulse:      Resp:   (!) 24   Temp:      TempSrc:      SpO2:      Weight:      Height:      PainSc: 10-Worst pain ever 9   8     Isolation Precautions No active isolations  Medications Medications  oxyCODONE-acetaminophen (PERCOCET/ROXICET) 5-325 MG per tablet 1 tablet (1 tablet Oral Given 11/29/18 1735)  azithromycin (ZITHROMAX) 500 mg in sodium chloride 0.9 % 250 mL IVPB (500 mg Intravenous New Bag/Given 11/29/18 2057)  morphine 4 MG/ML injection 4 mg (4 mg Intravenous Given 11/29/18 1933)  sodium chloride 0.9 % bolus 500 mL (0 mLs Intravenous Stopped 11/29/18 2056)  iopamidol (ISOVUE-370) 76 % injection 100 mL (100 mLs Intravenous Contrast Given 11/29/18 1915)  cefTRIAXone (ROCEPHIN) 1 g in sodium chloride 0.9 % 100 mL IVPB (0 g Intravenous Stopped 11/29/18 2055)  sodium chloride 0.9 % bolus 1,000 mL (0 mLs Intravenous Stopped 11/29/18 2055)  HYDROmorphone (DILAUDID) injection 1 mg (1 mg Intravenous Given 11/29/18 2014)  sodium chloride 0.9 % bolus 1,000 mL (1,000 mLs Intravenous New Bag/Given 11/29/18 2055)    Mobility walks Low fall risk   Focused Assessments Pulmonary  Assessment Handoff:  Lung sounds:   O2 Device: Room Air        R Recommendations: See Admitting Provider Note  Report given to:   Additional Notes:

## 2018-11-29 NOTE — ED Notes (Signed)
Pt refuses to give urine sample for preg POC - she states she is not pregnant, her tubes are tied, and she is on her period - Dr Burlene Arnt aware and pt will sign waiver for scan

## 2018-11-29 NOTE — ED Triage Notes (Addendum)
Patient c/o chest pain that radiates to L neck and back since this AM that has gradual gotten worse throughout the day

## 2018-11-29 NOTE — ED Notes (Signed)
In Xray, will come to room 12 when complete.

## 2018-11-30 LAB — BASIC METABOLIC PANEL
Anion gap: 8 (ref 5–15)
BUN: 8 mg/dL (ref 6–20)
CO2: 24 mmol/L (ref 22–32)
Calcium: 8.3 mg/dL — ABNORMAL LOW (ref 8.9–10.3)
Chloride: 104 mmol/L (ref 98–111)
Creatinine, Ser: 0.76 mg/dL (ref 0.44–1.00)
GFR calc Af Amer: >60 mL/min (ref >60)
GFR calc non Af Amer: >60 mL/min (ref >60)
Glucose, Bld: 116 mg/dL — ABNORMAL HIGH (ref 70–99)
POTASSIUM: 3.6 mmol/L (ref 3.5–5.1)
Sodium: 136 mmol/L (ref 135–145)

## 2018-11-30 LAB — HEMOGLOBIN A1C
Hgb A1c MFr Bld: 4.8 % (ref 4.8–5.6)
Mean Plasma Glucose: 91.06 mg/dL

## 2018-11-30 LAB — CBC
HCT: 31.7 % — ABNORMAL LOW (ref 36.0–46.0)
Hemoglobin: 9.3 g/dL — ABNORMAL LOW (ref 12.0–15.0)
MCH: 26.6 pg (ref 26.0–34.0)
MCHC: 29.3 g/dL — ABNORMAL LOW (ref 30.0–36.0)
MCV: 90.8 fL (ref 80.0–100.0)
Platelets: 167 10*3/uL (ref 150–400)
RBC: 3.49 MIL/uL — ABNORMAL LOW (ref 3.87–5.11)
RDW: 21.2 % — ABNORMAL HIGH (ref 11.5–15.5)
WBC: 13.9 10*3/uL — AB (ref 4.0–10.5)
nRBC: 0 % (ref 0.0–0.2)

## 2018-11-30 LAB — LACTIC ACID, PLASMA: Lactic Acid, Venous: 1.1 mmol/L (ref 0.5–1.9)

## 2018-11-30 LAB — PROTIME-INR
INR: 1 (ref 0.8–1.2)
Prothrombin Time: 13.4 seconds (ref 11.4–15.2)

## 2018-11-30 LAB — PROCALCITONIN: Procalcitonin: <0.1 ng/mL

## 2018-11-30 LAB — APTT: aPTT: 33 seconds (ref 24–36)

## 2018-11-30 MED ORDER — CYCLOBENZAPRINE HCL 5 MG PO TABS
7.5000 mg | ORAL_TABLET | Freq: Three times a day (TID) | ORAL | Status: DC
  Administered 2018-11-30 – 2018-12-02 (×6): 7.5 mg via ORAL
  Filled 2018-11-30 (×8): qty 1.5

## 2018-11-30 MED ORDER — HYDROCOD POLST-CPM POLST ER 10-8 MG/5ML PO SUER
5.0000 mL | Freq: Two times a day (BID) | ORAL | Status: DC
  Administered 2018-11-30 – 2018-12-02 (×5): 5 mL via ORAL
  Filled 2018-11-30 (×5): qty 5

## 2018-11-30 MED ORDER — KETOROLAC TROMETHAMINE 30 MG/ML IJ SOLN
30.0000 mg | Freq: Four times a day (QID) | INTRAMUSCULAR | Status: DC
  Administered 2018-11-30 – 2018-12-02 (×9): 30 mg via INTRAVENOUS
  Filled 2018-11-30 (×9): qty 1

## 2018-11-30 MED ORDER — TRAZODONE HCL 50 MG PO TABS
50.0000 mg | ORAL_TABLET | Freq: Every evening | ORAL | Status: DC | PRN
  Administered 2018-12-01 (×2): 50 mg via ORAL
  Filled 2018-11-30 (×2): qty 1

## 2018-11-30 MED ORDER — SODIUM CHLORIDE 0.9 % IV SOLN
INTRAVENOUS | Status: DC | PRN
  Administered 2018-11-30 – 2018-12-01 (×2): 500 mL via INTRAVENOUS

## 2018-11-30 NOTE — Progress Notes (Signed)
Post pain assessment found patient to still be in pain in chest area.  Dr. Tressia Miners made aware.  New orders received for toradol scheduled.  Patient given 1200 dose for pain relief.

## 2018-11-30 NOTE — Progress Notes (Signed)
[place] [facility] [place] NAME: Katrina Page    MR#:  [id number]  DATE OF BIRTH:  Jun 04, 1981  SUBJECTIVE:  CHIEF COMPLAINT:   Chief Complaint  Patient presents with  . Chest Pain   -Feels miserable secondary to pleuritic right-sided chest pain.  Denies any palpitations. -On 2 L oxygen which is acute  REVIEW OF SYSTEMS:  Review of Systems  Constitutional: Positive for malaise/fatigue. Negative for chills and fever.  Respiratory: Positive for cough and shortness of breath. Negative for wheezing.   Cardiovascular: Positive for chest pain. Negative for palpitations and leg swelling.  Gastrointestinal: Negative for abdominal pain, constipation, diarrhea, nausea and vomiting.  Genitourinary: Negative for dysuria.  Musculoskeletal: Negative for myalgias.  Neurological: Negative for dizziness, focal weakness, seizures, weakness and headaches.  Psychiatric/Behavioral: Negative for depression.    DRUG ALLERGIES:  No Known Allergies  VITALS:  Blood pressure (!) 152/94, pulse 100, temperature 99.9 F (37.7 C), temperature source Oral, resp. rate 18, height 5\' 9"  (1.753 m), weight 98.2 kg, last menstrual period 11/29/2018, SpO2 100 %.  PHYSICAL EXAMINATION:  Physical Exam   GENERAL:  38 y.o.-year-old obese patient lying in the bed, appears to be in distress secondary to right-sided chest pain.  EYES: Pupils equal, round, reactive to light and accommodation. No scleral icterus. Extraocular muscles intact.  HEENT: Head atraumatic, normocephalic. Oropharynx and nasopharynx clear.  NECK:  Supple, no jugular venous distention. No thyroid enlargement, no tenderness.  LUNGS: Moving air bilaterally, decreased right-sided breath sounds secondary to poor inspiratory effort, no wheezing, rales,rhonchi or crepitation. No use of accessory muscles of respiration.  CARDIOVASCULAR: S1, S2 normal. No murmurs, rubs, or gallops.  ABDOMEN: Soft, nontender,  nondistended. Bowel sounds present. No organomegaly or mass.  EXTREMITIES: No pedal edema, cyanosis, or clubbing.  NEUROLOGIC: Cranial nerves II through XII are intact. Muscle strength 5/5 in all extremities. Sensation intact. Gait not checked.  PSYCHIATRIC: The patient is alert and oriented x 3.  SKIN: No obvious rash, lesion, or ulcer.    LABORATORY PANEL:   CBC Recent Labs  Lab 11/30/18 0055  WBC 13.9*  HGB 9.3*  HCT 31.7*  PLT 167   ------------------------------------------------------------------------------------------------------------------  Chemistries  Recent Labs  Lab 11/30/18 0055  NA 136  K 3.6  CL 104  CO2 24  GLUCOSE 116*  BUN 8  CREATININE 0.76  CALCIUM 8.3*   ------------------------------------------------------------------------------------------------------------------  Cardiac Enzymes Recent Labs  Lab 11/29/18 1730  TROPONINI <0.03   ------------------------------------------------------------------------------------------------------------------  RADIOLOGY:  Dg Chest 2 View  Result Date: 11/29/2018 CLINICAL DATA:  Right-sided chest pain. EXAM: CHEST - 2 VIEW COMPARISON:  August 12, 2018 FINDINGS: Mild infiltrate in the right base. The heart, hila, mediastinum, lungs, and pleura are otherwise unremarkable. IMPRESSION: Findings suspicious for pneumonia in the right lung base. Recommend treatment with follow-up chest x-ray to ensure resolution. Electronically Signed   By: Dorise Bullion III M.D   On: 11/29/2018 18:26   Ct Angio Chest Pe W And/or Wo Contrast  Result Date: 11/29/2018 CLINICAL DATA:  38 year old female with severe right chest pain, pain radiating to the left neck and back. Onset this morning and progressive. EXAM: CT ANGIOGRAPHY CHEST WITH CONTRAST TECHNIQUE: Multidetector CT imaging of the chest was performed using the standard protocol during bolus administration of intravenous contrast. Multiplanar CT image reconstructions and  MIPs were obtained to evaluate the vascular anatomy. CONTRAST:  140mL ISOVUE-370 IOPAMIDOL (ISOVUE-370) INJECTION 76% COMPARISON:  Chest radiographs earlier today. CT Abdomen  and Pelvis 11/27/2007. FINDINGS: Cardiovascular: Adequate contrast bolus timing in the pulmonary arterial tree. Intermittent respiratory motion. No central or hilar pulmonary artery filling defect. No convincing pulmonary embolus although right lower lobe branches are heterogeneous. No cardiomegaly or pericardial effusion. Negative visible aorta. Mediastinum/Nodes: Small reactive appearing subcarinal lymph nodes, mildly hyperenhancing. Negative mediastinum elsewhere. Lungs/Pleura: Major airways are patent. Patchy and confluent peribronchial opacity in both the right middle and lower lobes which is primarily nonenhancing. Trace superimposed right pleural effusion. Minor atelectasis superimposed in the medial right upper lobe. Similar minor atelectasis suspected in the lingula. Upper Abdomen: Negative visible liver, spleen, stomach. There is a small 14 millimeter low-density right adrenal nodule which is new since 2009 but most suggestive of benign adenoma. Musculoskeletal: No acute osseous abnormality identified. Review of the MIP images confirms the above findings. IMPRESSION: 1. Positive for right middle and lower lobe bronchopneumonia with trace right pleural effusion. Reactive appearing subcarinal lymph nodes. 2. No convincing acute pulmonary embolus.  Negative visible aorta. 3. Small benign right adrenal adenoma suspected. Electronically Signed   By: Genevie Ann M.D.   On: 11/29/2018 19:29    EKG:   Orders placed or performed during the hospital encounter of 11/29/18  . EKG 12-Lead  . EKG 12-Lead  . ED EKG  . ED EKG  . EKG 12-Lead  . EKG 12-Lead    ASSESSMENT AND PLAN:   38 year old female with past medical history significant for obesity, migraines presents to hospital secondary to significant right-sided chest pain and noted  to have sepsis secondary to pneumonia  1.  Sepsis-secondary to right middle lobe and right lower lobe pneumonia. -Follow blood cultures -Continue Rocephin and azithromycin.  Significant pleuritic chest pain is noted -Follow-up chest x-ray in a.m.  Added Toradol for pain -Continue cough medications  2.  Hypokalemia-being replaced  3.  Anemia of chronic disease-no indication for transfusion.  Continue to monitor  4.  Tobacco use disorder- nicotine patch  5.  DVT prophylaxis-on Lovenox  Patient is independent at baseline   All the records are reviewed and case discussed with Care Management/Social Workerr. Management plans discussed with the patient, family and they are in agreement.  CODE STATUS: Full code  TOTAL TIME TAKING CARE OF THIS PATIENT: 38 minutes.   POSSIBLE D/C IN 2-3 DAYS, DEPENDING ON CLINICAL CONDITION.   Gladstone Lighter M.D on 11/30/2018 at 11:57 AM  Between 7am to 6pm - Pager - [phone]  After 6pm go to [email or web address] - password [email or web address] [place] [facility]  Office  [phone]  CC: Primary care physician; Patient, No Pcp Per

## 2018-11-30 NOTE — TOC Initial Note (Signed)
Transition of Care [facility]) - Initial/Assessment Note    Patient Details  Name: Katrina Page MRN: [id number] Date of Birth: Aug 11, 1981  Transition of Care [facility]) CM/SW Contact:    Elza Rafter, RN Phone Number: 11/30/2018, 12:50 PM  Clinical Narrative:     Patient admitted with PNA; receiving IV antibiotics.  Currently on room air.  Care manager consult for no insurance or PCP.   Provided patient with [facility] application.  Patient states she currently obtains medications from [place].  Expalined as she does not have insurance she could get medications at [facility] if she establishes with PCP in [facility].  [place] provides assistance.  Referral to [facility] after patient permission.  No further needs at this time.  Will follow discharge medications.           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) Lives with:: Minor Children, Parents Patient language and need for interpreter reviewed:: Yes Do you feel safe going back to the place where you live?: Yes      Need for Family Participation in Patient Care: No (Comment)     Criminal Activity/Legal Involvement Pertinent to Current Situation/Hospitalization: No - Comment as needed  Activities of Daily Living Home Assistive Devices/Equipment: None ADL Screening (condition at time of admission) Patient's cognitive ability adequate to safely complete daily activities?: No Is the patient deaf or have difficulty hearing?: No Does the patient have difficulty seeing, even when wearing glasses/contacts?: No Does the patient have  difficulty concentrating, remembering, or making decisions?: No Patient able to express need for assistance with ADLs?: Yes Does the patient have difficulty dressing or bathing?: No Independently performs ADLs?: Yes (appropriate for developmental age) Does the patient have difficulty walking or climbing stairs?: No Weakness of Legs: Both Weakness of Arms/Hands: None  Permission Sought/Granted                  Emotional Assessment Appearance:: Appears older than stated age Attitude/Demeanor/Rapport: Gracious, Engaged Affect (typically observed): Accepting Orientation: : Oriented to Self, Oriented to Place, Oriented to  Time, Oriented to Situation Alcohol / Substance Use: Not Applicable    Admission diagnosis:  Chronic 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 Sepsis ([place]) 11/29/2018   PCP:  Patient, No Pcp Per Pharmacy:   [place], [place] [address] Phone: [phone] Fax: [phone]     Social Determinants of Health (SDOH) Interventions    Readmission Risk Interventions 30 Day Unplanned Readmission Risk Score     ED to Hosp-Admission (Current) from 11/29/2018 in [place] (2A)  30 Day Unplanned Readmission Risk Score (%)  9 Filed at 11/30/2018 1200     This score is the patient's risk of an unplanned readmission within 30 days of being discharged (0 -100%). The score is based on dignosis, age, lab data, medications, orders, and past utilization.   Low:  0-14.9  Medium: 15-21.9   High: 22-29.9   Extreme: 30 and above       No flowsheet data found.

## 2018-12-01 ENCOUNTER — Inpatient Hospital Stay: Payer: Self-pay

## 2018-12-01 LAB — BASIC METABOLIC PANEL
ANION GAP: 7 (ref 5–15)
BUN: 8 mg/dL (ref 6–20)
CO2: 27 mmol/L (ref 22–32)
Calcium: 8.5 mg/dL — ABNORMAL LOW (ref 8.9–10.3)
Chloride: 105 mmol/L (ref 98–111)
Creatinine, Ser: 0.72 mg/dL (ref 0.44–1.00)
GFR calc Af Amer: >60 mL/min (ref >60)
GFR calc non Af Amer: >60 mL/min (ref >60)
Glucose, Bld: 93 mg/dL (ref 70–99)
Potassium: 3.7 mmol/L (ref 3.5–5.1)
Sodium: 139 mmol/L (ref 135–145)

## 2018-12-01 LAB — HIV ANTIBODY (ROUTINE TESTING W REFLEX): HIV Screen 4th Generation wRfx: NONREACTIVE

## 2018-12-01 MED ORDER — AZITHROMYCIN 250 MG PO TABS
500.0000 mg | ORAL_TABLET | Freq: Every day | ORAL | Status: AC
  Administered 2018-12-01: 500 mg via ORAL
  Filled 2018-12-01: qty 2

## 2018-12-01 NOTE — Progress Notes (Signed)
Patient ambulated in hall with staff. Tolerated well.  Patient encouraged to ambulate independently in hall to improve breathing.

## 2018-12-01 NOTE — Progress Notes (Signed)
[place] at [place] NAME: Katrina Page    MR#:  [id number]  DATE OF BIRTH:  [birth year]  SUBJECTIVE:  CHIEF COMPLAINT:   Chief Complaint  Patient presents with   Chest Pain   - off oxygen today - still has right sided pleuritic chest pain  REVIEW OF SYSTEMS:  Review of Systems  Constitutional: Positive for malaise/fatigue. Negative for chills and fever.  Respiratory: Positive for cough and shortness of breath. Negative for wheezing.   Cardiovascular: Positive for chest pain. Negative for palpitations and leg swelling.  Gastrointestinal: Negative for abdominal pain, constipation, diarrhea, nausea and vomiting.  Genitourinary: Negative for dysuria.  Musculoskeletal: Negative for myalgias.  Neurological: Negative for dizziness, focal weakness, seizures, weakness and headaches.  Psychiatric/Behavioral: Negative for depression.    DRUG ALLERGIES:  No Known Allergies  VITALS:  Blood pressure 121/76, pulse 78, temperature 98.4 F (36.9 C), temperature source Oral, resp. rate 14, height 5\' 9"  (1.753 m), weight 98.4 kg, last menstrual period 11/29/2018, SpO2 94 %.  PHYSICAL EXAMINATION:  Physical Exam   GENERAL:  38 y.o.-year-old obese patient lying in the bed, appears to be in distress secondary to right-sided chest pain. Though pain is much better today EYES: Pupils equal, round, reactive to light and accommodation. No scleral icterus. Extraocular muscles intact.  HEENT: Head atraumatic, normocephalic. Oropharynx and nasopharynx clear.  NECK:  Supple, no jugular venous distention. No thyroid enlargement, no tenderness.  LUNGS: Moving air bilaterally, decreased right-sided breath sounds secondary to poor inspiratory effort, no wheezing, rales,rhonchi or crepitation. No use of accessory muscles of respiration.  CARDIOVASCULAR: S1, S2 normal. No murmurs, rubs, or gallops.  ABDOMEN: Soft, nontender, nondistended. Bowel sounds present. No  organomegaly or mass.  EXTREMITIES: No pedal edema, cyanosis, or clubbing.  NEUROLOGIC: Cranial nerves II through XII are intact. Muscle strength 5/5 in all extremities. Sensation intact. Gait not checked.  PSYCHIATRIC: The patient is alert and oriented x 3.  SKIN: No obvious rash, lesion, or ulcer.    LABORATORY PANEL:   CBC Recent Labs  Lab 11/30/18 0055  WBC 13.9*  HGB 9.3*  HCT 31.7*  PLT 167   ------------------------------------------------------------------------------------------------------------------  Chemistries  Recent Labs  Lab 12/01/18 0304  NA 139  K 3.7  CL 105  CO2 27  GLUCOSE 93  BUN 8  CREATININE 0.72  CALCIUM 8.5*   ------------------------------------------------------------------------------------------------------------------  Cardiac Enzymes Recent Labs  Lab 11/29/18 1730  TROPONINI <0.03   ------------------------------------------------------------------------------------------------------------------  RADIOLOGY:  Dg Chest 2 View  Result Date: 12/01/2018 CLINICAL DATA:  Pneumonia follow-up EXAM: CHEST - 2 VIEW COMPARISON:  Two days ago FINDINGS: Increased right basilar pneumonia. Lung volumes are low. Normal heart size. No effusion or pneumothorax. IMPRESSION: Progressive right-sided pneumonia. Electronically Signed   By: Monte Fantasia M.D.   On: 12/01/2018 08:57   Dg Chest 2 View  Result Date: 11/29/2018 CLINICAL DATA:  Right-sided chest pain. EXAM: CHEST - 2 VIEW COMPARISON:  August 12, 2018 FINDINGS: Mild infiltrate in the right base. The heart, hila, mediastinum, lungs, and pleura are otherwise unremarkable. IMPRESSION: Findings suspicious for pneumonia in the right lung base. Recommend treatment with follow-up chest x-ray to ensure resolution. Electronically Signed   By: Dorise Bullion III M.D   On: 11/29/2018 18:26   Ct Angio Chest Pe W And/or Wo Contrast  Result Date: 11/29/2018 CLINICAL DATA:  38 year old female with severe  right chest pain, pain radiating to the left neck and back. Onset this morning  and progressive. EXAM: CT ANGIOGRAPHY CHEST WITH CONTRAST TECHNIQUE: Multidetector CT imaging of the chest was performed using the standard protocol during bolus administration of intravenous contrast. Multiplanar CT image reconstructions and MIPs were obtained to evaluate the vascular anatomy. CONTRAST:  162mL ISOVUE-370 IOPAMIDOL (ISOVUE-370) INJECTION 76% COMPARISON:  Chest radiographs earlier today. CT Abdomen and Pelvis 11/27/2007. FINDINGS: Cardiovascular: Adequate contrast bolus timing in the pulmonary arterial tree. Intermittent respiratory motion. No central or hilar pulmonary artery filling defect. No convincing pulmonary embolus although right lower lobe branches are heterogeneous. No cardiomegaly or pericardial effusion. Negative visible aorta. Mediastinum/Nodes: Small reactive appearing subcarinal lymph nodes, mildly hyperenhancing. Negative mediastinum elsewhere. Lungs/Pleura: Major airways are patent. Patchy and confluent peribronchial opacity in both the right middle and lower lobes which is primarily nonenhancing. Trace superimposed right pleural effusion. Minor atelectasis superimposed in the medial right upper lobe. Similar minor atelectasis suspected in the lingula. Upper Abdomen: Negative visible liver, spleen, stomach. There is a small 14 millimeter low-density right adrenal nodule which is new since 2009 but most suggestive of benign adenoma. Musculoskeletal: No acute osseous abnormality identified. Review of the MIP images confirms the above findings. IMPRESSION: 1. Positive for right middle and lower lobe bronchopneumonia with trace right pleural effusion. Reactive appearing subcarinal lymph nodes. 2. No convincing acute pulmonary embolus.  Negative visible aorta. 3. Small benign right adrenal adenoma suspected. Electronically Signed   By: Genevie Ann M.D.   On: 11/29/2018 19:29    EKG:   Orders placed or  performed during the hospital encounter of 11/29/18   EKG 12-Lead   EKG 12-Lead   ED EKG   ED EKG   EKG 12-Lead   EKG 12-Lead    ASSESSMENT AND PLAN:   38 year old female with past medical history significant for obesity, migraines presents to hospital secondary to significant right-sided chest pain and noted to have sepsis secondary to pneumonia  1.  Sepsis-secondary to right middle lobe and right lower lobe pneumonia. -Negative blood cultures -Continue Rocephin and azithromycin.  Significant pleuritic chest pain is noted -Toradol added for pain with some improvement -Chest x-ray showing progressive pneumonia.  CT chest on admission reviewed that showed right-sided pneumonia.  Continue to monitor.  No pleural effusion.  Follow-up WBC tomorrow -Continue cough medications  2.  Hypokalemia- replaced  3.  Anemia of chronic disease-no indication for transfusion.  Continue to monitor  4.  Tobacco use disorder- nicotine patch  5.  DVT prophylaxis-on Lovenox  Patient is independent at baseline Encourage ambulation today   All the records are reviewed and case discussed with Care Management/Social Workerr. Management plans discussed with the patient, family and they are in agreement.  CODE STATUS: Full code  TOTAL TIME TAKING CARE OF THIS PATIENT: 36 minutes.   POSSIBLE D/C IN 1-2 DAYS, DEPENDING ON CLINICAL CONDITION.   Gladstone Lighter M.D on 12/01/2018 at 10:39 AM  Between 7am to 6pm - Pager - [phone]  After 6pm go to [email or web address] - password [email or web address] [facility]  Office  [phone]  CC: Primary care physician; Patient, No Pcp Per

## 2018-12-02 LAB — BASIC METABOLIC PANEL
Anion gap: 9 (ref 5–15)
BUN: 10 mg/dL (ref 6–20)
CHLORIDE: 106 mmol/L (ref 98–111)
CO2: 24 mmol/L (ref 22–32)
Calcium: 8.5 mg/dL — ABNORMAL LOW (ref 8.9–10.3)
Creatinine, Ser: 0.83 mg/dL (ref 0.44–1.00)
GFR calc Af Amer: >60 mL/min (ref >60)
GFR calc non Af Amer: >60 mL/min (ref >60)
Glucose, Bld: 92 mg/dL (ref 70–99)
Potassium: 3.4 mmol/L — ABNORMAL LOW (ref 3.5–5.1)
Sodium: 139 mmol/L (ref 135–145)

## 2018-12-02 LAB — CBC
HEMATOCRIT: 26.4 % — AB (ref 36.0–46.0)
HEMOGLOBIN: 7.8 g/dL — AB (ref 12.0–15.0)
MCH: 27.4 pg (ref 26.0–34.0)
MCHC: 29.5 g/dL — ABNORMAL LOW (ref 30.0–36.0)
MCV: 92.6 fL (ref 80.0–100.0)
Platelets: 184 10*3/uL (ref 150–400)
RBC: 2.85 MIL/uL — ABNORMAL LOW (ref 3.87–5.11)
RDW: 21.4 % — ABNORMAL HIGH (ref 11.5–15.5)
WBC: 9 10*3/uL (ref 4.0–10.5)
nRBC: 0 % (ref 0.0–0.2)

## 2018-12-02 MED ORDER — CYCLOBENZAPRINE HCL 10 MG PO TABS: 10.0000 mg | ORAL_TABLET | Freq: Three times a day (TID) | ORAL | 0 refills | Status: AC | PRN

## 2018-12-02 MED ORDER — DEXTROMETHORPHAN-GUAIFENESIN 10-100 MG/5ML PO LIQD: 5.0000 mL | Freq: Four times a day (QID) | ORAL | 0 refills | Status: DC | PRN

## 2018-12-02 MED ORDER — POTASSIUM CHLORIDE CRYS ER 20 MEQ PO TBCR
40.0000 meq | EXTENDED_RELEASE_TABLET | Freq: Once | ORAL | Status: AC
  Administered 2018-12-02: 40 meq via ORAL
  Filled 2018-12-02: qty 2

## 2018-12-02 MED ORDER — AMOXICILLIN-POT CLAVULANATE 875-125 MG PO TABS: 1.0000 | ORAL_TABLET | Freq: Two times a day (BID) | ORAL | 0 refills | Status: AC

## 2018-12-02 MED ORDER — KETOROLAC TROMETHAMINE 10 MG PO TABS: 10.0000 mg | ORAL_TABLET | Freq: Four times a day (QID) | ORAL | 0 refills | Status: AC | PRN

## 2018-12-02 NOTE — Plan of Care (Signed)
  Problem: Activity: Goal: Will identify at least one activity in which they can participate Outcome: Progressing   Problem: Coping: Goal: Ability to identify and develop effective coping behavior will improve Outcome: Progressing Goal: Ability to interact with others will improve Outcome: Progressing Goal: Demonstration of participation in decision-making regarding own care will improve Outcome: Progressing Goal: Ability to use eye contact when communicating with others will improve Outcome: Progressing   Problem: Health Behavior/Discharge Planning: Goal: Identification of resources available to assist in meeting health care needs will improve Outcome: Progressing   Problem: Self-Concept: Goal: Will verbalize positive feelings about self Outcome: Progressing   Problem: Spiritual Needs Goal: Ability to function at adequate level Outcome: Progressing   Problem: Education: Goal: Knowledge of General Education information will improve Description Including pain rating scale, medication(s)/side effects and non-pharmacologic comfort measures Outcome: Progressing   Problem: Health Behavior/Discharge Planning: Goal: Ability to manage health-related needs will improve Outcome: Progressing   Problem: Clinical Measurements: Goal: Ability to maintain clinical measurements within normal limits will improve Outcome: Progressing Goal: Will remain free from infection Outcome: Progressing Goal: Diagnostic test results will improve Outcome: Progressing Goal: Respiratory complications will improve Outcome: Progressing Goal: Cardiovascular complication will be avoided Outcome: Progressing   Problem: Activity: Goal: Risk for activity intolerance will decrease Outcome: Progressing   Problem: Nutrition: Goal: Adequate nutrition will be maintained Outcome: Progressing   Problem: Coping: Goal: Level of anxiety will decrease Outcome: Progressing   Problem: Elimination: Goal: Will  not experience complications related to bowel motility Outcome: Progressing Goal: Will not experience complications related to urinary retention Outcome: Progressing   Problem: Pain Managment: Goal: General experience of comfort will improve Outcome: Progressing   Problem: Safety: Goal: Ability to remain free from injury will improve Outcome: Progressing   Problem: Skin Integrity: Goal: Risk for impaired skin integrity will decrease Outcome: Progressing

## 2018-12-02 NOTE — Progress Notes (Signed)
     Katrina Page was admitted to the [facility] on 11/29/2018 for an acute medical condition - pneumonia and is being Discharged on  12/02/2018 . She will need another 5 days for recovery and so advised to stay away from work until then as she is still coughing and has significant pain. So please excuse her from work for the above Days. Should be able to return to work  without any restrictions if improved  from 12/08/18.  Call Gladstone Lighter  MD, [facility] at  [phone] with questions.  Gladstone Lighter M.D on 12/02/2018,at 10:42 AM  [facility] [address] [place] [address]

## 2018-12-02 NOTE — TOC Progression Note (Signed)
Transition of Care [facility]) - Progression Note    Patient Details  Name: Katrina Page MRN: [id number] Date of Birth: 04-May-1981  Transition of Care [facility]) CM/SW Contact  Elza Rafter, RN Phone Number: 12/02/2018, 10:38 AM  Clinical Narrative:   Patient will be discharging to home today.  Pres[facility].  [place] application was provided.  No further needs at this time.      Expected Discharge Plan: Home/Self Care Barriers to Discharge: Continued Medical Work up  Expected Discharge Plan and Services Expected Discharge Plan: Home/Self Care     Living arrangements for the past 2 months: Katrina Page) Expected Discharge Date: 12/01/18                         Social Determinants of Health (SDOH) Interventions    Readmission Risk Interventions 30 Day Unplanned Readmission Risk Score     ED to Hosp-Admission (Current) from 11/29/2018 in [place] (2A)  30 Day Unplanned Readmission Risk Score (%)  10 Filed at 12/02/2018 0800     This score is the patient's risk of an unplanned readmission within 30 days of being discharged (0 -100%). The score is based on dignosis, age, lab data, medications, orders, and past utilization.   Low:  0-14.9   Medium: 15-21.9   High: 22-29.9   Extreme: 30 and above       No flowsheet data found.

## 2018-12-03 NOTE — Discharge Summary (Signed)
[place] at [place] NAME: Katrina Page    MR#:  [id number]  DATE OF BIRTH:  03/17/1981  DATE OF ADMISSION:  11/29/2018   ADMITTING PHYSICIAN: Demetrios Loll, MD  DATE OF DISCHARGE: 12/02/2018 12:55 PM  PRIMARY CARE PHYSICIAN: Patient, No Pcp Per   ADMISSION DIAGNOSIS:   Chronic pneumonia [J18.9]  DISCHARGE DIAGNOSIS:   Active Problems:   Sepsis ([place])   SECONDARY DIAGNOSIS:   Past Medical History:  Diagnosis Date   Migraines     HOSPITAL COURSE:   38 year old female with past medical history significant for obesity, migraines presents to hospital secondary to significant right-sided chest pain and noted to have sepsis secondary to pneumonia  1.  Sepsis-secondary to right middle lobe and right lower lobe pneumonia. -Negative blood cultures -Continue Rocephin and azithromycin.  Significant pleuritic chest pain on admission -Toradol added for pain with significant improvement -Repeat chest x-ray showing progressive pneumonia.  CT chest on admission reviewed that showed right-sided pneumonia.   No pleural effusion.   -Normalized WBC.  Patient able to ambulate well, weaned off to room air.  Pleurisy much improved prior to discharge. -Continue cough medications  2.  Hypokalemia- replaced  3.  Anemia of chronic disease-no indication for transfusion.  Continue to monitor  4.  Tobacco use disorder- counseled here.  On nicotine patch in the hospital.   Patient is independent at baseline Encourage ambulation today  DISCHARGE CONDITIONS:   Guarded  CONSULTS OBTAINED:   None  DRUG ALLERGIES:   No Known Allergies DISCHARGE MEDICATIONS:   Allergies as of 12/02/2018   No Known Allergies     Medication List    TAKE these medications   acetaminophen 325 MG tablet Commonly known as:  TYLENOL Take 650 mg by mouth every 6 (six) hours as needed.   albuterol 108 (90 Base) MCG/ACT inhaler Commonly known as:  PROVENTIL  HFA;VENTOLIN HFA Inhale 2 puffs into the lungs every 4 (four) hours as needed for wheezing or shortness of breath.   amoxicillin-clavulanate 875-125 MG tablet Commonly known as:  Augmentin Take 1 tablet by mouth every 12 (twelve) hours for 10 days.   cyclobenzaprine 10 MG tablet Commonly known as:  FLEXERIL Take 1 tablet (10 mg total) by mouth 3 (three) times daily as needed for up to 7 days for muscle spasms. What changed:    medication strength  how much to take   dextromethorphan-guaiFENesin 10-100 MG/5ML liquid Commonly known as:  Tussin DM Take 5 mLs by mouth every 6 (six) hours as needed for cough.   ketorolac 10 MG tablet Commonly known as:  TORADOL Take 1 tablet (10 mg total) by mouth every 6 (six) hours as needed for up to 7 days for moderate pain or severe pain.   nabumetone 750 MG tablet Commonly known as:  RELAFEN Take 1 tablet (750 mg total) by mouth 2 (two) times daily.        DISCHARGE INSTRUCTIONS:   1.  PCP follow-up in 1 to 2 weeks  DIET:   Regular diet  ACTIVITY:   Activity as tolerated  OXYGEN:   Home Oxygen: No.  Oxygen Delivery: room air  DISCHARGE LOCATION:   home   If you experience worsening of your admission symptoms, develop shortness of breath, life threatening emergency, suicidal or homicidal thoughts you must seek medical attention immediately by calling 911 or calling your MD immediately  if symptoms less severe.  You Must read complete instructions/literature along  with all the possible adverse reactions/side effects for all the Medicines you take and that have been prescribed to you. Take any new Medicines after you have completely understood and accpet all the possible adverse reactions/side effects.   Please note  You were cared for by a hospitalist during your hospital stay. If you have any questions about your discharge medications or the care you received while you were in the hospital after you are discharged, you can  call the unit and asked to speak with the hospitalist on call if the hospitalist that took care of you is not available. Once you are discharged, your primary care physician will handle any further medical issues. Please note that NO REFILLS for any discharge medications will be authorized once you are discharged, as it is imperative that you return to your primary care physician (or establish a relationship with a primary care physician if you do not have one) for your aftercare needs so that they can reassess your need for medications and monitor your lab values.    On the day of Discharge:  VITAL SIGNS:   Blood pressure (!) 151/87, pulse 77, temperature 98.1 F (36.7 C), temperature source Oral, resp. rate 18, height 5\' 9"  (1.753 m), weight 98.4 kg, last menstrual period 11/29/2018, SpO2 98 %.  PHYSICAL EXAMINATION:    GENERAL:  38 y.o.-year-old obese patient lying in the bed, not in any acute distress. EYES: Pupils equal, round, reactive to light and accommodation. No scleral icterus. Extraocular muscles intact.  HEENT: Head atraumatic, normocephalic. Oropharynx and nasopharynx clear.  NECK:  Supple, no jugular venous distention. No thyroid enlargement, no tenderness.  LUNGS: Moving air bilaterally, decreased right-sided breath sounds, no wheezing, rales,rhonchi or crepitation. No use of accessory muscles of respiration.  CARDIOVASCULAR: S1, S2 normal. No murmurs, rubs, or gallops.  ABDOMEN: Soft, nontender, nondistended. Bowel sounds present. No organomegaly or mass.  EXTREMITIES: No pedal edema, cyanosis, or clubbing.  NEUROLOGIC: Cranial nerves II through XII are intact. Muscle strength 5/5 in all extremities. Sensation intact. Gait not checked.  PSYCHIATRIC: The patient is alert and oriented x 3.  SKIN: No obvious rash, lesion, or ulcer.    DATA REVIEW:   CBC Recent Labs  Lab 12/02/18 0531  WBC 9.0  HGB 7.8*  HCT 26.4*  PLT 184    Chemistries  Recent Labs  Lab  12/02/18 0531  NA 139  K 3.4*  CL 106  CO2 24  GLUCOSE 92  BUN 10  CREATININE 0.83  CALCIUM 8.5*     Microbiology Results  Results for orders placed or performed during the hospital encounter of 11/29/18  Culture, blood (routine x 2)     Status: None (Preliminary result)   Collection Time: 11/29/18 11:14 PM  Result Value Ref Range Status   Specimen Description BLOOD BLOOD LEFT ARM  Final   Special Requests   Final    BOTTLES DRAWN AEROBIC AND ANAEROBIC Blood Culture adequate volume   Culture   Final    NO GROWTH 4 DAYS Performed at [facility], [address] [place], [place] [address]    Report Status PENDING  Incomplete  Culture, blood (routine x 2)     Status: None (Preliminary result)   Collection Time: 11/29/18 11:15 PM  Result Value Ref Range Status   Specimen Description BLOOD BLOOD RIGHT ARM  Final   Special Requests   Final    BOTTLES DRAWN AEROBIC AND ANAEROBIC Blood Culture adequate volume   Culture  Final    NO GROWTH 4 DAYS Performed at [facility], [address] [place], [place] [address]    Report Status PENDING  Incomplete    RADIOLOGY:  No results found.   Management plans discussed with the patient, family and they are in agreement.  CODE STATUS:  Code Status History    Date Active Date Inactive Code Status Order ID Comments User Context   11/29/2018 2316 12/02/2018 1600 Full Code [id number]  Demetrios Loll, MD Inpatient      TOTAL TIME TAKING CARE OF THIS PATIENT: 38 minutes.    Gladstone Lighter M.D on 12/03/2018 at 2:01 PM  Between 7am to 6pm - Pager - [phone]  After 6pm go to [email or web address] - Proofreader  [facility]  Office  [phone]  CC: Primary care physician; Patient, No Pcp Per   Note: This dictation was prepared with Dragon dictation along with smaller phrase technology. Any transcriptional errors that result from this process are unintentional.

## 2018-12-04 LAB — CULTURE, BLOOD (ROUTINE X 2)
Culture: NO GROWTH
Culture: NO GROWTH
SPECIAL REQUESTS: ADEQUATE
Special Requests: ADEQUATE

## 2019-06-23 DIAGNOSIS — R911 Solitary pulmonary nodule: Secondary | ICD-10-CM

## 2019-06-23 DIAGNOSIS — E278 Other specified disorders of adrenal gland: Secondary | ICD-10-CM

## 2019-06-23 HISTORY — DX: Other specified disorders of adrenal gland: E27.8

## 2019-06-23 HISTORY — DX: Solitary pulmonary nodule: R91.1

## 2019-07-23 ENCOUNTER — Other Ambulatory Visit: Payer: Self-pay

## 2019-07-23 ENCOUNTER — Inpatient Hospital Stay
Admission: EM | Admit: 2019-07-23 | Discharge: 2019-07-26 | DRG: 176 | Disposition: A | Discharge status: Home / Self Care | Payer: Self-pay | Attending: Internal Medicine | Admitting: Internal Medicine

## 2019-07-23 ENCOUNTER — Emergency Department: Payer: Self-pay

## 2019-07-23 ENCOUNTER — Encounter: Payer: Self-pay | Admitting: Emergency Medicine

## 2019-07-23 DIAGNOSIS — N132 Hydronephrosis with renal and ureteral calculous obstruction: Secondary | ICD-10-CM | POA: Diagnosis present

## 2019-07-23 DIAGNOSIS — Z79899 Other long term (current) drug therapy: Secondary | ICD-10-CM

## 2019-07-23 DIAGNOSIS — I2699 Other pulmonary embolism without acute cor pulmonale: Principal | ICD-10-CM | POA: Diagnosis present

## 2019-07-23 DIAGNOSIS — Z86711 Personal history of pulmonary embolism: Secondary | ICD-10-CM | POA: Diagnosis present

## 2019-07-23 DIAGNOSIS — Z9049 Acquired absence of other specified parts of digestive tract: Secondary | ICD-10-CM

## 2019-07-23 DIAGNOSIS — F17210 Nicotine dependence, cigarettes, uncomplicated: Secondary | ICD-10-CM | POA: Diagnosis present

## 2019-07-23 DIAGNOSIS — I2694 Multiple subsegmental pulmonary emboli without acute cor pulmonale: Secondary | ICD-10-CM

## 2019-07-23 DIAGNOSIS — Z833 Family history of diabetes mellitus: Secondary | ICD-10-CM

## 2019-07-23 DIAGNOSIS — Z9851 Tubal ligation status: Secondary | ICD-10-CM

## 2019-07-23 DIAGNOSIS — J189 Pneumonia, unspecified organism: Secondary | ICD-10-CM

## 2019-07-23 DIAGNOSIS — Z720 Tobacco use: Secondary | ICD-10-CM

## 2019-07-23 DIAGNOSIS — E876 Hypokalemia: Secondary | ICD-10-CM | POA: Diagnosis present

## 2019-07-23 DIAGNOSIS — C801 Malignant (primary) neoplasm, unspecified: Secondary | ICD-10-CM

## 2019-07-23 DIAGNOSIS — Z20828 Contact with and (suspected) exposure to other viral communicable diseases: Secondary | ICD-10-CM | POA: Diagnosis present

## 2019-07-23 DIAGNOSIS — Z803 Family history of malignant neoplasm of breast: Secondary | ICD-10-CM

## 2019-07-23 DIAGNOSIS — Z8249 Family history of ischemic heart disease and other diseases of the circulatory system: Secondary | ICD-10-CM

## 2019-07-23 DIAGNOSIS — R0600 Dyspnea, unspecified: Secondary | ICD-10-CM

## 2019-07-23 DIAGNOSIS — R911 Solitary pulmonary nodule: Secondary | ICD-10-CM | POA: Diagnosis present

## 2019-07-23 DIAGNOSIS — Z716 Tobacco abuse counseling: Secondary | ICD-10-CM

## 2019-07-23 DIAGNOSIS — N138 Other obstructive and reflux uropathy: Secondary | ICD-10-CM | POA: Diagnosis present

## 2019-07-23 DIAGNOSIS — Z823 Family history of stroke: Secondary | ICD-10-CM

## 2019-07-23 DIAGNOSIS — R079 Chest pain, unspecified: Secondary | ICD-10-CM

## 2019-07-23 DIAGNOSIS — G43909 Migraine, unspecified, not intractable, without status migrainosus: Secondary | ICD-10-CM | POA: Diagnosis present

## 2019-07-23 DIAGNOSIS — E278 Other specified disorders of adrenal gland: Secondary | ICD-10-CM | POA: Diagnosis present

## 2019-07-23 HISTORY — DX: Other pulmonary embolism without acute cor pulmonale: I26.99

## 2019-07-23 LAB — CBC WITH DIFFERENTIAL/PLATELET
Abs Immature Granulocytes: 0.03 10*3/uL (ref 0.00–0.07)
Basophils Absolute: 0 10*3/uL (ref 0.0–0.1)
Basophils Relative: 0 %
Eosinophils Absolute: 0.2 10*3/uL (ref 0.0–0.5)
Eosinophils Relative: 2 %
HCT: 33.9 % — ABNORMAL LOW (ref 36.0–46.0)
Hemoglobin: 10 g/dL — ABNORMAL LOW (ref 12.0–15.0)
Immature Granulocytes: 0 %
Lymphocytes Relative: 12 %
Lymphs Abs: 1.2 10*3/uL (ref 0.7–4.0)
MCH: 25.6 pg — ABNORMAL LOW (ref 26.0–34.0)
MCHC: 29.5 g/dL — ABNORMAL LOW (ref 30.0–36.0)
MCV: 86.7 fL (ref 80.0–100.0)
Monocytes Absolute: 0.5 10*3/uL (ref 0.1–1.0)
Monocytes Relative: 5 %
Neutro Abs: 8.2 10*3/uL — ABNORMAL HIGH (ref 1.7–7.7)
Neutrophils Relative %: 81 %
Platelets: 185 10*3/uL (ref 150–400)
RBC: 3.91 MIL/uL (ref 3.87–5.11)
RDW: 22.1 % — ABNORMAL HIGH (ref 11.5–15.5)
Smear Review: NORMAL
WBC: 10.1 10*3/uL (ref 4.0–10.5)
nRBC: 0 % (ref 0.0–0.2)

## 2019-07-23 LAB — COMPREHENSIVE METABOLIC PANEL
ALT: 9 U/L (ref 0–44)
AST: 16 U/L (ref 15–41)
Albumin: 3.1 g/dL — ABNORMAL LOW (ref 3.5–5.0)
Alkaline Phosphatase: 49 U/L (ref 38–126)
Anion gap: 13 (ref 5–15)
BUN: 6 mg/dL (ref 6–20)
CO2: 30 mmol/L (ref 22–32)
Calcium: 8.4 mg/dL — ABNORMAL LOW (ref 8.9–10.3)
Chloride: 94 mmol/L — ABNORMAL LOW (ref 98–111)
Creatinine, Ser: 0.82 mg/dL (ref 0.44–1.00)
GFR calc Af Amer: >60 mL/min (ref >60)
GFR calc non Af Amer: >60 mL/min (ref >60)
Glucose, Bld: 120 mg/dL — ABNORMAL HIGH (ref 70–99)
Potassium: 2.8 mmol/L — ABNORMAL LOW (ref 3.5–5.1)
Sodium: 137 mmol/L (ref 135–145)
Total Bilirubin: 0.8 mg/dL (ref 0.3–1.2)
Total Protein: 6.2 g/dL — ABNORMAL LOW (ref 6.5–8.1)

## 2019-07-23 LAB — PROTIME-INR
INR: 1.1 (ref 0.8–1.2)
Prothrombin Time: 13.8 seconds (ref 11.4–15.2)

## 2019-07-23 LAB — MAGNESIUM: Magnesium: 1.4 mg/dL — ABNORMAL LOW (ref 1.7–2.4)

## 2019-07-23 LAB — LACTIC ACID, PLASMA: Lactic Acid, Venous: 1.4 mmol/L (ref 0.5–1.9)

## 2019-07-23 LAB — APTT: aPTT: 31 seconds (ref 24–36)

## 2019-07-23 MED ORDER — POTASSIUM CHLORIDE CRYS ER 20 MEQ PO TBCR
40.0000 meq | EXTENDED_RELEASE_TABLET | Freq: Once | ORAL | Status: AC
  Administered 2019-07-23: 40 meq via ORAL
  Filled 2019-07-23: qty 2

## 2019-07-23 MED ORDER — MORPHINE SULFATE (PF) 4 MG/ML IV SOLN
4.0000 mg | Freq: Once | INTRAVENOUS | Status: AC
  Administered 2019-07-23: 4 mg via INTRAVENOUS
  Filled 2019-07-23: qty 1

## 2019-07-23 MED ORDER — ACETAMINOPHEN 650 MG RE SUPP
650.0000 mg | Freq: Four times a day (QID) | RECTAL | Status: DC | PRN
  Filled 2019-07-23: qty 1

## 2019-07-23 MED ORDER — HEPARIN BOLUS VIA INFUSION
6000.0000 [IU] | Freq: Once | INTRAVENOUS | Status: AC
  Administered 2019-07-23: 6000 [IU] via INTRAVENOUS
  Filled 2019-07-23: qty 6000

## 2019-07-23 MED ORDER — ACETAMINOPHEN 325 MG PO TABS
650.0000 mg | ORAL_TABLET | Freq: Four times a day (QID) | ORAL | Status: DC | PRN
  Administered 2019-07-24: 04:00:00 650 mg via ORAL
  Filled 2019-07-23: qty 2

## 2019-07-23 MED ORDER — KETOROLAC TROMETHAMINE 30 MG/ML IJ SOLN
30.0000 mg | Freq: Four times a day (QID) | INTRAMUSCULAR | Status: DC | PRN
  Administered 2019-07-23 – 2019-07-24 (×2): 30 mg via INTRAVENOUS
  Filled 2019-07-23 (×2): qty 1

## 2019-07-23 MED ORDER — METHYLPREDNISOLONE SODIUM SUCC 125 MG IJ SOLR
125.0000 mg | Freq: Once | INTRAMUSCULAR | Status: AC
  Administered 2019-07-23: 125 mg via INTRAVENOUS
  Filled 2019-07-23: qty 2

## 2019-07-23 MED ORDER — ACETAMINOPHEN 325 MG PO TABS
650.0000 mg | ORAL_TABLET | Freq: Once | ORAL | Status: AC | PRN
  Administered 2019-07-23: 650 mg via ORAL
  Filled 2019-07-23: qty 2

## 2019-07-23 MED ORDER — ONDANSETRON HCL 4 MG/2ML IJ SOLN: 4.0000 mg | Freq: Four times a day (QID) | INTRAMUSCULAR | Status: DC | PRN

## 2019-07-23 MED ORDER — ONDANSETRON HCL 4 MG PO TABS
4.0000 mg | ORAL_TABLET | Freq: Four times a day (QID) | ORAL | Status: DC | PRN
  Filled 2019-07-23: qty 1

## 2019-07-23 MED ORDER — IOHEXOL 350 MG/ML SOLN
75.0000 mL | Freq: Once | INTRAVENOUS | Status: AC | PRN
  Administered 2019-07-23: 75 mL via INTRAVENOUS

## 2019-07-23 MED ORDER — SODIUM CHLORIDE 0.9 % IV BOLUS
1000.0000 mL | Freq: Once | INTRAVENOUS | Status: AC
  Administered 2019-07-23: 1000 mL via INTRAVENOUS

## 2019-07-23 MED ORDER — LEVOFLOXACIN IN D5W 750 MG/150ML IV SOLN: 750.0000 mg | Freq: Once | INTRAVENOUS | Status: DC

## 2019-07-23 MED ORDER — FENTANYL CITRATE (PF) 100 MCG/2ML IJ SOLN
50.0000 ug | Freq: Once | INTRAMUSCULAR | Status: AC
  Administered 2019-07-23: 50 ug via INTRAVENOUS
  Filled 2019-07-23: qty 2

## 2019-07-23 MED ORDER — LEVOFLOXACIN IN D5W 750 MG/150ML IV SOLN
750.0000 mg | Freq: Once | INTRAVENOUS | Status: AC
  Administered 2019-07-23: 750 mg via INTRAVENOUS
  Filled 2019-07-23: qty 150

## 2019-07-23 MED ORDER — MORPHINE SULFATE (PF) 2 MG/ML IV SOLN
2.0000 mg | INTRAVENOUS | Status: DC | PRN
  Administered 2019-07-24 – 2019-07-26 (×9): 2 mg via INTRAVENOUS
  Filled 2019-07-23 (×9): qty 1

## 2019-07-23 MED ORDER — ONDANSETRON HCL 4 MG/2ML IJ SOLN
4.0000 mg | Freq: Once | INTRAMUSCULAR | Status: AC
  Administered 2019-07-23: 4 mg via INTRAVENOUS
  Filled 2019-07-23: qty 2

## 2019-07-23 MED ORDER — HEPARIN (PORCINE) 25000 UT/250ML-% IV SOLN
1700.0000 [IU]/h | INTRAVENOUS | Status: DC
  Administered 2019-07-23 – 2019-07-24 (×2): 1700 [IU]/h via INTRAVENOUS
  Filled 2019-07-23 (×2): qty 250

## 2019-07-23 NOTE — ED Notes (Addendum)
Pt reports she is extremely uncomfortable in hospital bed. Blue recliner chair placed in pt room and pt allowed to move from stretcher to recliner for pt comfort. Pt instructed to hit call light if she wishes to get up for any reason or if she needs assistance.

## 2019-07-23 NOTE — ED Provider Notes (Signed)
[facility] Emergency Department Provider Note  ____________________________________________   None    (approximate)  I have reviewed the triage vital signs and the nursing notes.   HISTORY  Chief Complaint Chest Pain and Shortness of Breath   HPI Katrina Page is a 38 y.o. female who presents to the emergency department for treatment and evaluation of right side chest and back pain. Symptoms started 2 days ago. She reports feeling this way when she had pneumonia earlier in the year. Subjective fever at home. She denies cough or other symptoms. She works at [employer], but does not have any COVID19 exposure that she is aware of.  Past Medical History:  Diagnosis Date  . Migraines     Patient Active Problem List   Diagnosis Date Noted  . Sepsis ([place]) 11/29/2018    Past Surgical History:  Procedure Laterality Date  . CHOLECYSTECTOMY    . TUBAL LIGATION      Prior to Admission medications   Medication Sig Start Date End Date Taking? Authorizing Provider  acetaminophen (TYLENOL) 325 MG tablet Take 650 mg by mouth every 6 (six) hours as needed.    [provider]  albuterol (PROVENTIL HFA;VENTOLIN HFA) 108 (90 Base) MCG/ACT inhaler Inhale 2 puffs into the lungs every 4 (four) hours as needed for wheezing or shortness of breath. 08/12/18   Cuthriell, Charline Bills, PA-C  dextromethorphan-guaiFENesin (TUSSIN DM) 10-100 MG/5ML liquid Take 5 mLs by mouth every 6 (six) hours as needed for cough. 12/02/18   Gladstone Lighter, MD  nabumetone (RELAFEN) 750 MG tablet Take 1 tablet (750 mg total) by mouth 2 (two) times daily. 09/07/18   Menshew, Dannielle Karvonen, PA-C    Allergies Patient has no known allergies.  Family History  Problem Relation Age of Onset  . Hypertension Mother   . Diabetes Mother   . Stroke Mother   . Breast cancer Mother     Social History Social History   Tobacco Use  . Smoking status: Current Every Day Smoker    Packs/day:  0.50    Types: Cigarettes  . Smokeless tobacco: Never Used  Substance Use Topics  . Alcohol use: No  . Drug use: No    Review of Systems  Constitutional: Positive for fever/chills. Eyes: No visual changes. ENT: No sore throat. Cardiovascular: Positive for chest pain. Positive for pleuritic pain. Negative for palpitations. Negative for leg pain. Respiratory: Positive for shortness of breath. Gastrointestinal: Negative for abdominal pain. Negative for nausea, no vomiting.  No diarrhea.  No constipation. Genitourinary: Negative for dysuria. Musculoskeletal: Positive for back pain.  Skin: Negative for rash, lesion, wound. Neurological: Negative for headaches, focal weakness or numbness. ____________________________________________   PHYSICAL EXAM:  VITAL SIGNS: ED Triage Vitals  Enc Vitals Group     BP 07/23/19 1104 119/78     Pulse Rate 07/23/19 1104 90     Resp 07/23/19 1104 (!) 24     Temp 07/23/19 1104 (!) 100.8 F (38.2 C)     Temp Source 07/23/19 1104 Oral     SpO2 07/23/19 1104 96 %     Weight 07/23/19 1105 300 lb (136.1 kg)     Height 07/23/19 1105 5\' 9"  (1.753 m)     Head Circumference --      Peak Flow --      Pain Score 07/23/19 1105 10     Pain Loc --      Pain Edu? --      Excl.  in [place]? --     Constitutional: Alert and oriented. Uncomfortable appearing and in no acute distress. Normal mental status. Eyes: Conjunctivae are normal. PERRL. Head: Atraumatic. Nose: No congestion/rhinnorhea. Mouth/Throat: Mucous membranes are moist.  Oropharynx non-erythematous. Tongue normal in size and color. Neck: No stridor. No carotid bruit appreciated on exam. Hematological/Lymphatic/Immunilogical: No cervical lymphadenopathy. Cardiovascular: Normal rate, regular rhythm. Grossly normal heart sounds.  Good peripheral circulation. Respiratory: Normal respiratory effort.  No retractions. Lungs CTAB. Gastrointestinal: Soft and nontender. No distention. No abdominal bruits. No  CVA tenderness. Genitourinary: Exam deferred. Musculoskeletal: No lower extremity tenderness. No edema of extremities. Neurologic:  Normal speech and language. No gross focal neurologic deficits are appreciated. Skin:  Skin is warm, dry and intact. No rash noted. Psychiatric: Mood and affect are normal. Speech and behavior are normal.  ____________________________________________   LABS (all labs ordered are listed, but only abnormal results are displayed)  Labs Reviewed  COMPREHENSIVE METABOLIC PANEL - Abnormal; Notable for the following components:      Result Value   Potassium 2.8 (*)    Chloride 94 (*)    Glucose, Bld 120 (*)    Calcium 8.4 (*)    Total Protein 6.2 (*)    Albumin 3.1 (*)    All other components within normal limits  CBC WITH DIFFERENTIAL/PLATELET - Abnormal; Notable for the following components:   Hemoglobin 10.0 (*)    HCT 33.9 (*)    MCH 25.6 (*)    MCHC 29.5 (*)    RDW 22.1 (*)    Neutro Abs 8.2 (*)    All other components within normal limits  SARS CORONAVIRUS 2 (TAT 6-24 HRS)  LACTIC ACID, PLASMA  PROTIME-INR  APTT  POC URINE PREG, ED   ____________________________________________  EKG  ED ECG REPORT I, Jozalyn Baglio, FNP-BC personally viewed and interpreted this ECG.   Date: 07/23/2019  EKG Time: 1107  Rate: 97  Rhythm: normal EKG, normal sinus rhythm  Axis: normal  Intervals:none  ST&T Change: no ST elevation  ____________________________________________  RADIOLOGY  ED MD interpretation:  Patchy and linear densities in the right lung.  Official radiology report(s): Dg Chest 2 View  Result Date: 07/23/2019 CLINICAL DATA:  Fever and shortness of breath. Right back and chest pain with inspiration. EXAM: CHEST - 2 VIEW COMPARISON:  12/01/2018 FINDINGS: Linear densities in the mid right lung. Patchy densities in the right lower chest. Right chest densities are predominantly in the anterior aspect of the right chest. No large pleural  effusions. Left lung is clear. Heart and mediastinum are within normal limits. Trachea is midline. Negative for a pneumothorax. Bone structures are unremarkable. IMPRESSION: Patchy and linear densities in the right chest. Some of the densities may represent postinflammatory changes from previous pneumonia but there is concern for acute infectious or inflammatory process. Electronically Signed   By: Markus Daft M.D.   On: 07/23/2019 12:16   Ct Angio Chest Pe W And/or Wo Contrast  Result Date: 07/23/2019 CLINICAL DATA:  Shortness of breath. EXAM: CT ANGIOGRAPHY CHEST WITH CONTRAST TECHNIQUE: Multidetector CT imaging of the chest was performed using the standard protocol during bolus administration of intravenous contrast. Multiplanar CT image reconstructions and MIPs were obtained to evaluate the vascular anatomy. CONTRAST:  78mL OMNIPAQUE IOHEXOL 350 MG/ML SOLN, 70mL OMNIPAQUE IOHEXOL 350 MG/ML SOLN COMPARISON:  November 29, 2018 FINDINGS: Cardiovascular: The thoracic aorta is nonaneurysmal without dissection or atherosclerotic change. The heart is unremarkable. Evaluation of the pulmonary arteries is somewhat limited due  to timing of contrast. However, pulmonary emboli are identified. An embolus is seen within a segmental branch of the left lower lobe on axial image 125. A pulmonary embolus is seen within the segmental branch of the right lower lobe as well on axial image 145. No upper lobe emboli identified. No central emboli identified. Mediastinum/Nodes: Thyroid and esophagus are normal. Mildly prominent node in the right hilum is more prominent in the interval measuring 1.5 cm, favored to be reactive. No other adenopathy. There is a small right pleural effusion which is larger in the interval. No pericardial effusion. Lungs/Pleura: Central airways are normal. No pneumothorax. Patchy opacity is seen in the right upper and lower lobes. Much of the opacity is platelike suggesting at least some scar atelectasis.  However, superimposed patchy pneumonia not excluded. No infiltrate seen in the left lung. A rounded nodular density is seen in the anterior right lung on axial image 59 measuring 1.8 cm. No other nodules or masses. Upper Abdomen: Renal stones are seen bilaterally. There is a stone in the left renal pelvis measuring up to 1.3 cm with mild hydronephrosis on the left and stranding adjacent to the renal pelvis. This is similar in appearance when compared to the November 27, 2007 study. Multiple other stones are seen in the left kidney. There is a mass abutting the left adrenal gland measuring 5.6 cm. This mass is new when compared to a CT of the abdomen and pelvis from Jan 27, 2008. The patient is status post cholecystectomy. Musculoskeletal: No chest wall abnormality. No acute or significant osseous findings. Review of the MIP images confirms the above findings. IMPRESSION: 1. The study is positive for pulmonary emboli in the bilateral lower lobes with no evidence of strain. 2. There is a nodule along the medial right anterior lung on series 4, image 60 measuring 1.8 cm. This was not present in March of 2020. This could represent focal rounded atelectasis given the short interval follow-up but neoplasm/malignancy is not excluded on this study. Given the new mass thought to arise from the left renal gland described below, recommend a PET-CT for further evaluation of this nodule. 3. 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 The mass is nonspecific but malignancy/neoplasm not excluded. Given the new nodular density in the anterior right lung, recommend a PET-CT for further evaluation. 4. Prominent node in the right hilum. This may be reactive. Recommend attention on the recommended PET-CT. 5. Patchy opacities in the lungs. Some of the opacities are clearly atelectasis and/or scar. A superimposed multifocal infectious process is not excluded. 6. There is  a stone in the left renal pelvis measuring up to 1.3 cm. Mild left hydronephrosis and fat stranding adjacent to the left renal pelvis suggests an acute component. Finding is similar when compared to 2009 however. 7. Multiple other stones in both kidneys. Aortic Atherosclerosis (ICD10-I70.0). Electronically Signed   By: Dorise Bullion III M.D   On: 07/23/2019 15:25    ____________________________________________   PROCEDURES  Procedure(s) performed: None  Procedures  Critical Care performed: Yes, see critical care note(s)  ____________________________________________   INITIAL IMPRESSION / ASSESSMENT AND PLAN / ED COURSE  38 year old female presents to the emergency department for treatment and evaluation of right-sided chest pain that increases with movement and deep breath.  See HPI for further details.  Plan to do a CTA chest to rule out pulmonary embolus.  Fentanyl  ordered for pain relief.  Chest x-ray does show a concern for atelectasis versus pneumonia.  Will hold off on antibiotics until CT is resulted.  ----------------------------------------- 3:48 PM on 07/23/2019 -----------------------------------------  CT shows a new mass thought to arise from the left renal gland as well as a mass in the left side of the abdomen as well as a nodular density in the anterior right lung.  There is also patchy opacity in the lungs multifocal infectious process is not excluded.  Results discussed with the patient.  She is aware that she will need to be admitted for further investigation of concerning findings on CT.  She was also made aware that she has bilateral pulmonary embolus and will require heparin.  She again confirms that she is not currently taking any type of anticoagulants.  ----------------------------------------- 4:04 PM on 07/23/2019 -----------------------------------------  Case discussed with Dr. Verdell Carmine who agrees to admit the patient under the hospitalist services.   CRITICAL CARE Performed by: Sherrie George   Total critical care time: 45 minutes  Critical care time was exclusive of separately billable procedures and treating other patients.  Critical care was necessary to treat or prevent imminent or life-threatening deterioration.  Critical care was time spent personally by me on the following activities: development of treatment plan with patient and/or surrogate as well as nursing, discussions with consultants, evaluation of patient's response to treatment, examination of patient, obtaining history from patient or surrogate, ordering and performing treatments and interventions, ordering and review of laboratory studies, ordering and review of radiographic studies, pulse oximetry and re-evaluation of patient's condition.   Katrina Page was evaluated in Emergency Department on 07/23/2019 for the symptoms described in the history of present illness. She was evaluated in the context of the global COVID-19 pandemic, which necessitated consideration that the patient might be at risk for infection with the SARS-CoV-2 virus that causes COVID-19. Institutional protocols and algorithms that pertain to the evaluation of patients at risk for COVID-19 are in a state of rapid change based on information released by regulatory bodies including the CDC and federal and state organizations. These policies and algorithms were followed during the patient's care in the ED.   As part of my medical decision making, I reviewed the following data within the [place] Previous EKGs, last admission note.   ____________________________________________   FINAL CLINICAL IMPRESSION(S) / ED DIAGNOSES  Final diagnoses:  Pulmonary embolism on left [facility])  Pulmonary embolism on right [facility])  Pneumonia of right upper lobe due to infectious organism  Cancer [facility])     ED Discharge Orders    None       Note:  This document was prepared using Dragon voice  recognition software and may include unintentional dictation errors.   Victorino Dike, FNP 07/23/19 1608    Duffy Bruce, MD 07/23/19 2006

## 2019-07-23 NOTE — H&P (Signed)
[name] NAME: [name]    MR#:  [id number]  DATE OF BIRTH:  [birth year]  DATE OF ADMISSION:  07/23/2019  PRIMARY CARE PHYSICIAN: Patient, No Pcp Per   REQUESTING/REFERRING PHYSICIAN: Dr. Lindell Noe  CHIEF COMPLAINT:   Chief Complaint  Patient presents with  . Chest Pain  . Shortness of Breath    HISTORY OF PRESENT ILLNESS:  Katrina Page  is a 38 y.o. female with a known history of tobacco abuse, history of migraines who has not seen a physician in a long time who presents to the hospital due to chest pain, shortness of breath.  Patient says she developed chest pains which were more pleuritic in nature and worsening shortness of breath over the past 3 days and has progressively gotten worse.  Today her pain was significantly worse just at rest and on minimal exertion and therefore came to the ER for further evaluation.  She denies any recent travel history, hemoptysis, fever, chills, loss of taste.  Patient's COVID-19 test is still pending.  She denies any night sweats or weight loss.  She underwent a CT scan of the chest which was positive for bilateral pulmonary embolism with possible lung mass and also an adrenal mass.  Hospitalist services were contacted for admission.  PAST MEDICAL HISTORY:   Past Medical History:  Diagnosis Date  . Migraines     PAST SURGICAL HISTORY:   Past Surgical History:  Procedure Laterality Date  . CHOLECYSTECTOMY    . TUBAL LIGATION      SOCIAL HISTORY:   Social History   Tobacco Use  . Smoking status: Current Every Day Smoker    Packs/day: 0.50    Years: 20.00    Pack years: 10.00    Types: Cigarettes  . Smokeless tobacco: Never Used  Substance Use Topics  . Alcohol use: Yes    Comment: 2-3 week.      FAMILY HISTORY:   Family History  Problem Relation Age of Onset  . Hypertension Mother   . Diabetes Mother   . Stroke Mother   . Breast cancer Mother     DRUG  ALLERGIES:  No Known Allergies  REVIEW OF SYSTEMS:   Review of Systems  Constitutional: Negative for fever and weight loss.  HENT: Negative for congestion, nosebleeds and tinnitus.   Eyes: Negative for blurred vision, double vision and redness.  Respiratory: Positive for shortness of breath. Negative for cough and hemoptysis.   Cardiovascular: Positive for chest pain. Negative for orthopnea, leg swelling and PND.  Gastrointestinal: Negative for abdominal pain, diarrhea, melena, nausea and vomiting.  Genitourinary: Negative for dysuria, hematuria and urgency.  Musculoskeletal: Negative for falls and joint pain.  Neurological: Negative for dizziness, tingling, sensory change, focal weakness, seizures, weakness and headaches.  Endo/Heme/Allergies: Negative for polydipsia. Does not bruise/bleed easily.  Psychiatric/Behavioral: Negative for depression and memory loss. The patient is not nervous/anxious.     MEDICATIONS AT HOME:   Prior to Admission medications   Medication Sig Start Date End Date Taking? Authorizing Provider  acetaminophen (TYLENOL) 325 MG tablet Take 650 mg by mouth every 6 (six) hours as needed for mild pain or fever.    Yes [provider]  albuterol (PROVENTIL HFA;VENTOLIN HFA) 108 (90 Base) MCG/ACT inhaler Inhale 2 puffs into the lungs every 4 (four) hours as needed for wheezing or shortness of breath. Patient not taking: Reported on 07/23/2019 08/12/18   Cuthriell, Charline Bills, PA-C  dextromethorphan-guaiFENesin (TUSSIN DM) 10-100 MG/5ML liquid Take 5 mLs by mouth every 6 (six) hours as needed for cough. Patient not taking: Reported on 07/23/2019 12/02/18   Gladstone Lighter, MD  nabumetone (RELAFEN) 750 MG tablet Take 1 tablet (750 mg total) by mouth 2 (two) times daily. Patient not taking: Reported on 07/23/2019 09/07/18   Menshew, Dannielle Karvonen, PA-C      VITAL SIGNS:  Blood pressure 129/77, pulse 78, temperature (!) 100.8 F (38.2 C), temperature  source Oral, resp. rate 14, height 5\' 9"  (1.753 m), weight 136.1 kg, last menstrual period 07/21/2019, SpO2 (!) 89 %.  PHYSICAL EXAMINATION:  Physical Exam  GENERAL:  38 y.o.-year-old patient lying in the bed in some emotional distress EYES: Pupils equal, round, reactive to light and accommodation. No scleral icterus. Extraocular muscles intact.  HEENT: Head atraumatic, normocephalic. Oropharynx and nasopharynx clear. No oropharyngeal erythema, moist oral mucosa  NECK:  Supple, no jugular venous distention. No thyroid enlargement, no tenderness.  LUNGS: Poor respiratory effort, patient is splinting with deep inspiration.  No rhonchi, wheezing, Rales.  Negative use of accessory muscles. CARDIOVASCULAR: S1, S2 RRR. No murmurs, rubs, gallops, clicks.  ABDOMEN: Soft, nontender, nondistended. Bowel sounds present. No organomegaly or mass.  EXTREMITIES: No pedal edema, cyanosis, or clubbing. + 2 pedal & radial pulses b/l.   NEUROLOGIC: Cranial nerves II through XII are intact. No focal Motor or sensory deficits appreciated b/l PSYCHIATRIC: The patient is alert and oriented x 3.  SKIN: No obvious rash, lesion, or ulcer.   LABORATORY PANEL:   CBC Recent Labs  Lab 07/23/19 1113  WBC 10.1  HGB 10.0*  HCT 33.9*  PLT 185   ------------------------------------------------------------------------------------------------------------------  Chemistries  Recent Labs  Lab 07/23/19 1113  NA 137  K 2.8*  CL 94*  CO2 30  GLUCOSE 120*  BUN 6  CREATININE 0.82  CALCIUM 8.4*  AST 16  ALT 9  ALKPHOS 49  BILITOT 0.8   ------------------------------------------------------------------------------------------------------------------  Cardiac Enzymes No results for input(s): TROPONINI in the last 168 hours. ------------------------------------------------------------------------------------------------------------------  RADIOLOGY:  Dg Chest 2 View  Result Date: 07/23/2019 CLINICAL DATA:   Fever and shortness of breath. Right back and chest pain with inspiration. EXAM: CHEST - 2 VIEW COMPARISON:  12/01/2018 FINDINGS: Linear densities in the mid right lung. Patchy densities in the right lower chest. Right chest densities are predominantly in the anterior aspect of the right chest. No large pleural effusions. Left lung is clear. Heart and mediastinum are within normal limits. Trachea is midline. Negative for a pneumothorax. Bone structures are unremarkable. IMPRESSION: Patchy and linear densities in the right chest. Some of the densities may represent postinflammatory changes from previous pneumonia but there is concern for acute infectious or inflammatory process. Electronically Signed   By: Markus Daft M.D.   On: 07/23/2019 12:16   Ct Angio Chest Pe W And/or Wo Contrast  Result Date: 07/23/2019 CLINICAL DATA:  Shortness of breath. EXAM: CT ANGIOGRAPHY CHEST WITH CONTRAST TECHNIQUE: Multidetector CT imaging of the chest was performed using the standard protocol during bolus administration of intravenous contrast. Multiplanar CT image reconstructions and MIPs were obtained to evaluate the vascular anatomy. CONTRAST:  11mL OMNIPAQUE IOHEXOL 350 MG/ML SOLN, 75mL OMNIPAQUE IOHEXOL 350 MG/ML SOLN COMPARISON:  November 29, 2018 FINDINGS: Cardiovascular: The thoracic aorta is nonaneurysmal without dissection or atherosclerotic change. The heart is unremarkable. Evaluation of the pulmonary arteries is somewhat limited due to timing of contrast. However, pulmonary emboli are identified. An embolus is seen within  a segmental branch of the left lower lobe on axial image 125. A pulmonary embolus is seen within the segmental branch of the right lower lobe as well on axial image 145. No upper lobe emboli identified. No central emboli identified. Mediastinum/Nodes: Thyroid and esophagus are normal. Mildly prominent node in the right hilum is more prominent in the interval measuring 1.5 cm, favored to be reactive. No  other adenopathy. There is a small right pleural effusion which is larger in the interval. No pericardial effusion. Lungs/Pleura: Central airways are normal. No pneumothorax. Patchy opacity is seen in the right upper and lower lobes. Much of the opacity is platelike suggesting at least some scar atelectasis. However, superimposed patchy pneumonia not excluded. No infiltrate seen in the left lung. A rounded nodular density is seen in the anterior right lung on axial image 59 measuring 1.8 cm. No other nodules or masses. Upper Abdomen: Renal stones are seen bilaterally. There is a stone in the left renal pelvis measuring up to 1.3 cm with mild hydronephrosis on the left and stranding adjacent to the renal pelvis. This is similar in appearance when compared to the November 27, 2007 study. Multiple other stones are seen in the left kidney. There is a mass abutting the left adrenal gland measuring 5.6 cm. This mass is new when compared to a CT of the abdomen and pelvis from Jan 27, 2008. The patient is status post cholecystectomy. Musculoskeletal: No chest wall abnormality. No acute or significant osseous findings. Review of the MIP images confirms the above findings. IMPRESSION: 1. The study is positive for pulmonary emboli in the bilateral lower lobes with no evidence of strain. 2. There is a nodule along the medial right anterior lung on series 4, image 60 measuring 1.8 cm. This was not present in March of 2020. This could represent focal rounded atelectasis given the short interval follow-up but neoplasm/malignancy is not excluded on this study. Given the new mass thought to arise from the left renal gland described below, recommend a PET-CT for further evaluation of this nodule. 3. 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 The mass is nonspecific but malignancy/neoplasm not excluded. Given the new nodular density in the anterior right lung,  recommend a PET-CT for further evaluation. 4. Prominent node in the right hilum. This may be reactive. Recommend attention on the recommended PET-CT. 5. Patchy opacities in the lungs. Some of the opacities are clearly atelectasis and/or scar. A superimposed multifocal infectious process is not excluded. 6. There is a stone in the left renal pelvis measuring up to 1.3 cm. Mild left hydronephrosis and fat stranding adjacent to the left renal pelvis suggests an acute component. Finding is similar when compared to 2009 however. 7. Multiple other stones in both kidneys. Aortic Atherosclerosis (ICD10-I70.0). Electronically Signed   By: Dorise Bullion III M.D   On: 07/23/2019 15:25     IMPRESSION AND PLAN:   38 y.o. female with a known history of tobacco abuse, history of migraines who has not seen a physician in a long time who presents to the hospital due to chest pain, and.  1.  Acute pulmonary embolism-this is the cause of patient's worsening shortness of breath and chest pain. -Unclear if this is a provoked or unprovoked PE.  Patient CT scan does suggest possible lung nodule and also an adrenal mass. -We will treat the patient with IV heparin drip for now  and patient can be changed to oral anticoagulants in the next 1 to 2 days. -Patient is having some pleurisy, and will control this pain with IV Toradol/morphine as needed.  2.  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 -Patient has no B symptoms of night sweats or weight loss. -We will get oncology consult, sent message to Dr. Grayland Ormond. -Patient will likely need some tissue diagnosis.  3.  Hypokalemia-will replace potassium orally. -Repeat level in the morning.  Check magnesium level.    All the records are reviewed and case discussed with ED provider. Management plans discussed with the patient, family and they are in agreement.  CODE STATUS: Full code  TOTAL TIME TAKING CARE OF THIS PATIENT: 40 minutes.     Henreitta Leber M.D on 07/23/2019 at 4:45 PM  Between 7am to 6pm - Pager - [phone]  After 6pm go to [email or web address] - password [email or web address]  Office  [phone]  CC: Primary care physician; Patient, No Pcp Per

## 2019-07-23 NOTE — ED Notes (Signed)
Brother - Rosine Beat information [phone]

## 2019-07-23 NOTE — ED Notes (Signed)
Pt given meal tray and ginger ale at this time

## 2019-07-23 NOTE — ED Notes (Signed)
First Nurse Note: Pt to ED via POV c/o back pain and not being able to take a deep breath. Pt is in NAD at this time. Pt is speaking in complete sentences at this time.

## 2019-07-23 NOTE — ED Triage Notes (Signed)
Pt here for right back/chest pain worse with inspiration. Pt appears in pain but not labored at this time. VSS, low temp.  No cough per pt and unaware of fevers at home.  Admitted earlier this year for PNA.  Is a smoker, no travel or birth control.

## 2019-07-23 NOTE — Consult Note (Signed)
ANTICOAGULATION CONSULT NOTE - Initial Consult  Pharmacy Consult for Heparin Infusion/Dosing Indication: pulmonary embolus  No Known Allergies  Patient Measurements: Height: 5\' 9"  (175.3 cm) Weight: 300 lb (136.1 kg) IBW/kg (Calculated) : 66.2 Heparin Dosing Weight: 98.7 kg  Vital Signs: Temp: 100.8 F (38.2 C) (10/31 1104) Temp Source: Oral (10/31 1104) BP: 129/77 (10/31 1530) Pulse Rate: 78 (10/31 1532)  Labs: Recent Labs    07/23/19 1113  HGB 10.0*  HCT 33.9*  PLT 185  CREATININE 0.82    Estimated Creatinine Clearance: 135.6 mL/min (by C-G formula based on SCr of 0.82 mg/dL).   Medical History: Past Medical History:  Diagnosis Date  . Migraines     Medications:  (Not in a hospital admission)  Scheduled:  . heparin  6,000 Units Intravenous Once  .  morphine injection  4 mg Intravenous Once  . ondansetron (ZOFRAN) IV  4 mg Intravenous Once   Infusions:  . heparin    . levofloxacin (LEVAQUIN) IV    . sodium chloride     PRN:  Anti-infectives (From admission, onward)   Start     Dose/Rate Route Frequency Ordered Stop   07/23/19 1545  levofloxacin (LEVAQUIN) IVPB 750 mg     750 mg 100 mL/hr over 90 Minutes Intravenous  Once 07/23/19 1537     07/23/19 1315  levofloxacin (LEVAQUIN) IVPB 750 mg  Status:  Discontinued     750 mg 100 mL/hr over 90 Minutes Intravenous  Once 07/23/19 1310 07/23/19 1319      Assessment: Pharmacy consulted to initiate heparin drip in 38yo patient. Patient has no PTA history of anticoagulant use. Will initiate therapy immediately. Baseline labs have been ordered and are pending.  Goal of Therapy:  Heparin level 0.3-0.7 units/ml Monitor platelets by anticoagulation protocol: Yes   Plan:  Give 6000 units bolus x 1 Start heparin infusion at 1700 units/hr Check anti-Xa level in 6 hours and daily while on heparin Continue to monitor H&H and platelets  Brithany Whitworth A Roddrick Sharron 07/23/2019,3:58 PM

## 2019-07-24 ENCOUNTER — Inpatient Hospital Stay
Admit: 2019-07-24 | Discharge: 2019-07-24 | Disposition: A | Discharge status: Home / Self Care | Payer: Self-pay | Attending: Internal Medicine | Admitting: Internal Medicine

## 2019-07-24 ENCOUNTER — Inpatient Hospital Stay: Payer: Self-pay

## 2019-07-24 ENCOUNTER — Other Ambulatory Visit: Payer: Self-pay

## 2019-07-24 DIAGNOSIS — Z79899 Other long term (current) drug therapy: Secondary | ICD-10-CM

## 2019-07-24 DIAGNOSIS — E279 Disorder of adrenal gland, unspecified: Secondary | ICD-10-CM

## 2019-07-24 DIAGNOSIS — F17210 Nicotine dependence, cigarettes, uncomplicated: Secondary | ICD-10-CM

## 2019-07-24 DIAGNOSIS — R079 Chest pain, unspecified: Secondary | ICD-10-CM

## 2019-07-24 DIAGNOSIS — R0600 Dyspnea, unspecified: Secondary | ICD-10-CM

## 2019-07-24 DIAGNOSIS — Z3201 Encounter for pregnancy test, result positive: Secondary | ICD-10-CM

## 2019-07-24 DIAGNOSIS — R911 Solitary pulmonary nodule: Secondary | ICD-10-CM

## 2019-07-24 DIAGNOSIS — I2699 Other pulmonary embolism without acute cor pulmonale: Principal | ICD-10-CM

## 2019-07-24 DIAGNOSIS — I2694 Multiple subsegmental pulmonary emboli without acute cor pulmonale: Secondary | ICD-10-CM

## 2019-07-24 DIAGNOSIS — Z720 Tobacco use: Secondary | ICD-10-CM

## 2019-07-24 LAB — HEPARIN LEVEL (UNFRACTIONATED)
Heparin Unfractionated: 0.16 IU/mL — ABNORMAL LOW (ref 0.30–0.70)
Heparin Unfractionated: 0.62 IU/mL (ref 0.30–0.70)

## 2019-07-24 LAB — CBC
HCT: 31 % — ABNORMAL LOW (ref 36.0–46.0)
Hemoglobin: 9.2 g/dL — ABNORMAL LOW (ref 12.0–15.0)
MCH: 25.7 pg — ABNORMAL LOW (ref 26.0–34.0)
MCHC: 29.7 g/dL — ABNORMAL LOW (ref 30.0–36.0)
MCV: 86.6 fL (ref 80.0–100.0)
Platelets: 195 10*3/uL (ref 150–400)
RBC: 3.58 MIL/uL — ABNORMAL LOW (ref 3.87–5.11)
RDW: 21.2 % — ABNORMAL HIGH (ref 11.5–15.5)
WBC: 18.6 10*3/uL — ABNORMAL HIGH (ref 4.0–10.5)
nRBC: 0 % (ref 0.0–0.2)

## 2019-07-24 LAB — BASIC METABOLIC PANEL
Anion gap: 8 (ref 5–15)
BUN: 10 mg/dL (ref 6–20)
CO2: 31 mmol/L (ref 22–32)
Calcium: 8.2 mg/dL — ABNORMAL LOW (ref 8.9–10.3)
Chloride: 101 mmol/L (ref 98–111)
Creatinine, Ser: 0.82 mg/dL (ref 0.44–1.00)
GFR calc Af Amer: >60 mL/min (ref >60)
GFR calc non Af Amer: >60 mL/min (ref >60)
Glucose, Bld: 121 mg/dL — ABNORMAL HIGH (ref 70–99)
Potassium: 3.4 mmol/L — ABNORMAL LOW (ref 3.5–5.1)
Sodium: 140 mmol/L (ref 135–145)

## 2019-07-24 LAB — ECHOCARDIOGRAM COMPLETE
Height: 69 in
Weight: 4800 oz

## 2019-07-24 LAB — POCT PREGNANCY, URINE: Preg Test, Ur: POSITIVE — AB

## 2019-07-24 LAB — SARS CORONAVIRUS 2 (TAT 6-24 HRS): SARS Coronavirus 2: NEGATIVE

## 2019-07-24 LAB — ANTITHROMBIN III: AntiThromb III Func: 67 % — ABNORMAL LOW (ref 75–120)

## 2019-07-24 LAB — HCG, QUANTITATIVE, PREGNANCY: hCG, Beta Chain, Quant, S: <1 m[IU]/mL (ref <5)

## 2019-07-24 MED ORDER — HEPARIN (PORCINE) 25000 UT/250ML-% IV SOLN
2000.0000 [IU]/h | INTRAVENOUS | Status: DC
  Filled 2019-07-24 (×3): qty 250

## 2019-07-24 MED ORDER — HEPARIN BOLUS VIA INFUSION
3000.0000 [IU] | Freq: Once | INTRAVENOUS | Status: AC
  Administered 2019-07-24: 3000 [IU] via INTRAVENOUS
  Filled 2019-07-24: qty 3000

## 2019-07-24 MED ORDER — OXYCODONE HCL 5 MG PO TABS
5.0000 mg | ORAL_TABLET | Freq: Four times a day (QID) | ORAL | Status: DC | PRN
  Administered 2019-07-24 – 2019-07-25 (×3): 5 mg via ORAL
  Filled 2019-07-24 (×3): qty 1

## 2019-07-24 MED ORDER — ENOXAPARIN SODIUM 150 MG/ML ~~LOC~~ SOLN
1.0000 mg/kg | Freq: Two times a day (BID) | SUBCUTANEOUS | Status: DC
  Administered 2019-07-24: 18:00:00 135 mg via SUBCUTANEOUS
  Filled 2019-07-24 (×3): qty 0.9

## 2019-07-24 MED ORDER — SENNOSIDES-DOCUSATE SODIUM 8.6-50 MG PO TABS
2.0000 | ORAL_TABLET | Freq: Every evening | ORAL | Status: DC | PRN
  Filled 2019-07-24: qty 2

## 2019-07-24 MED ORDER — TAMSULOSIN HCL 0.4 MG PO CAPS
0.4000 mg | ORAL_CAPSULE | Freq: Every day | ORAL | Status: DC
  Administered 2019-07-24 – 2019-07-26 (×3): 0.4 mg via ORAL
  Filled 2019-07-24 (×5): qty 1

## 2019-07-24 MED ORDER — NICOTINE 21 MG/24HR TD PT24
21.0000 mg | MEDICATED_PATCH | Freq: Every day | TRANSDERMAL | Status: DC
  Administered 2019-07-24 – 2019-07-26 (×3): 21 mg via TRANSDERMAL
  Filled 2019-07-24 (×3): qty 1

## 2019-07-24 MED ORDER — POLYETHYLENE GLYCOL 3350 17 G PO PACK
17.0000 g | PACK | Freq: Every day | ORAL | Status: DC | PRN
  Filled 2019-07-24: qty 1

## 2019-07-24 MED ORDER — KETOROLAC TROMETHAMINE 30 MG/ML IJ SOLN: 30.0000 mg | Freq: Once | INTRAMUSCULAR | Status: DC

## 2019-07-24 MED ORDER — SODIUM CHLORIDE 0.9 % IV SOLN
INTRAVENOUS | Status: AC
  Administered 2019-07-24 – 2019-07-25 (×2): via INTRAVENOUS

## 2019-07-24 NOTE — ED Notes (Signed)
Pt ambulatory to restroom with steady gait.

## 2019-07-24 NOTE — Consult Note (Signed)
[facility] for Heparin Infusion/Dosing Indication: pulmonary embolus  No Known Allergies  Patient Measurements: Height: 5\' 9"  (175.3 cm) Weight: 300 lb (136.1 kg) IBW/kg (Calculated) : 66.2 Heparin Dosing Weight: 98.7 kg  Vital Signs: Temp: 98 F (36.7 C) (11/01 0806) Temp Source: Oral (11/01 0806) BP: 132/69 (11/01 1442) Pulse Rate: 88 (11/01 1442)  Labs: Recent Labs    07/23/19 1113 07/23/19 1619 07/24/19 0633 07/24/19 1440  HGB 10.0*  --  9.2*  --   HCT 33.9*  --  31.0*  --   PLT 185  --  195  --   APTT  --  31  --   --   LABPROT  --  13.8  --   --   INR  --  1.1  --   --   HEPARINUNFRC  --   --  0.16* 0.62  CREATININE 0.82  --  0.82  --     Estimated Creatinine Clearance: 135.6 mL/min (by C-G formula based on SCr of 0.82 mg/dL).   Medical History: Past Medical History:  Diagnosis Date  . Migraines     Medications:  (Not in a hospital admission)  Scheduled:  . ketorolac  30 mg Intravenous Once  . nicotine  21 mg Transdermal Daily  . tamsulosin  0.4 mg Oral QPC breakfast   Infusions:  . sodium chloride 75 mL/hr at 07/24/19 1158  . heparin 2,000 Units/hr (07/24/19 0800)   PRN:  Anti-infectives (From admission, onward)   Start     Dose/Rate Route Frequency Ordered Stop   07/23/19 1545  levofloxacin (LEVAQUIN) IVPB 750 mg     750 mg 100 mL/hr over 90 Minutes Intravenous  Once 07/23/19 1537 07/23/19 1749   07/23/19 1315  levofloxacin (LEVAQUIN) IVPB 750 mg  Status:  Discontinued     750 mg 100 mL/hr over 90 Minutes Intravenous  Once 07/23/19 1310 07/23/19 1319      Assessment: Pharmacy consulted to initiate heparin drip in 38yo patient. Patient has no PTA history of anticoagulant use. Will initiate therapy immediately. Baseline labs have been ordered and are pending.  11/01 @ 0633 HL= 0.16. Will order bolus of 3000 units and increase drip to 2000 units/hr.  Goal of Therapy:  Heparin level 0.3-0.7  units/ml Monitor platelets by anticoagulation protocol: Yes   Plan:  Give 6000 units bolus x 1 Start heparin infusion at 1700 units/hr Check anti-Xa level in 6 hours and daily while on heparin Continue to monitor H&H and platelets   11/01 @ 1440 HL= 0.62. Will continue Heparin drip at 2000 units/hr. Will check confirmatory HL in 6 hours.  Katya Rolston A 07/24/2019,3:20 PM

## 2019-07-24 NOTE — ED Notes (Signed)
Provided pt with breakfast tray no other needs at present

## 2019-07-24 NOTE — ED Notes (Signed)
US at bedside

## 2019-07-24 NOTE — Consult Note (Signed)
ANTICOAGULATION CONSULT NOTE   Pharmacy Consult for Enoxaparin Dosing Indication: pulmonary embolus  No Known Allergies  Patient Measurements: Height: 5\' 9"  (175.3 cm) Weight: 300 lb (136.1 kg) IBW/kg (Calculated) : 66.2 Heparin Dosing Weight: 98.7 kg  Vital Signs: Temp: 98.4 F (36.9 C) (11/01 1539) Temp Source: Oral (11/01 1539) BP: 114/86 (11/01 1539) Pulse Rate: 89 (11/01 1539)  Labs: Recent Labs    07/23/19 1113 07/23/19 1619 07/24/19 0633 07/24/19 1440  HGB 10.0*  --  9.2*  --   HCT 33.9*  --  31.0*  --   PLT 185  --  195  --   APTT  --  31  --   --   LABPROT  --  13.8  --   --   INR  --  1.1  --   --   HEPARINUNFRC  --   --  0.16* 0.62  CREATININE 0.82  --  0.82  --     Estimated Creatinine Clearance: 135.6 mL/min (by C-G formula based on SCr of 0.82 mg/dL).   Medical History: Past Medical History:  Diagnosis Date  . Migraines     Medications:  (Not in Page hospital admission)  Scheduled:  . enoxaparin (LOVENOX) injection  1 mg/kg Subcutaneous Q12H  . ketorolac  30 mg Intravenous Once  . nicotine  21 mg Transdermal Daily  . tamsulosin  0.4 mg Oral QPC breakfast   Infusions:  . sodium chloride 75 mL/hr at 07/24/19 1158   PRN:  Anti-infectives (From admission, onward)   Start     Dose/Rate Route Frequency Ordered Stop   07/23/19 1545  levofloxacin (LEVAQUIN) IVPB 750 mg     750 mg 100 mL/hr over 90 Minutes Intravenous  Once 07/23/19 1537 07/23/19 1749   07/23/19 1315  levofloxacin (LEVAQUIN) IVPB 750 mg  Status:  Discontinued     750 mg 100 mL/hr over 90 Minutes Intravenous  Once 07/23/19 1310 07/23/19 1319      Assessment: Pharmacy consulted to initiate heparin drip in 38yo patient. Patient has no PTA history of anticoagulant use. Will initiate therapy immediately. Baseline labs have been ordered and are pending.  11/01: Hospitalist would like to transition to therapeutic dose enoxaparin and discontinue heparin drip.  Goal of Therapy:   Monitor platelets by anticoagulation protocol: Yes   Plan:  Will order lovenox 1mg /kg Q12 hours. Hemoglobin and platelets remain stable currently. Will continue to monitor H&H and platelets daily with AM labs.  Katrina Page Katrina Page 07/24/2019,4:44 PM

## 2019-07-24 NOTE — Progress Notes (Signed)
PROGRESS NOTE    Katrina Page  [id number] DOB: 1980-10-28 DOA: 07/23/2019 PCP: Patient, No Pcp Per   Brief Narrative:  38 year old with a history of tobacco use, migraines came to the hospital complains of shortness of breath and atypical chest pain.  CT of the chest was consistent with bilateral lower lobe pulmonary embolism without cor pulmonale.  Also found to have multiple nodules including lung and a mass in the left adrenal.  Oncology team was consulted.   Assessment & Plan:   Principal Problem:   Pulmonary embolism (HCC) Active Problems:   Chest pain   Dyspnea   Tobacco use  Atypical chest pain secondary to acute bilateral pulmonary embolism in the lower lobes without cor pulmonale -Continue heparin drip.  Hypercoagulable work-up per oncology -Supportive care -Echocardiogram -Lower extremity Dopplers -Supplemental oxygen as needed.  Supportive measures.  Pain control.  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  Appreciate input from oncology team who is willing to follow this up outpatient.  Left-sided renal stone with mild hydronephrosis -IV fluids.  Flomax.  If no improvement, will consult urology.  Positive urine pregnancy test -Patient denies being pregnant.  Tells me she had her " tubes tied".  Quantitative test is negative.  Can follow-up outpatient GYN  Migraines -Pain control.  Daily tobacco use -Nicotine patch if needed  DVT prophylaxis: Heparin drip Code Status: Full code Family Communication: None Disposition Plan: Maintain in hospital stay.  She was still having atypical pressure-like chest pain and dyspnea on exertion.  Consultants:   Oncology  Procedures:   None  Antimicrobials:   None   Subjective: Patient seen in the ER.  Reports of pressure-like chest pain bilaterally especially with deep breaths, similar pain to when she came in with.  Continues to smoke half a pack of cigarettes  daily.  She denies being pregnant, tells me she had her tubes tied and has 2 children.  Denies having personal history of blood clots or malignancies.  Review of Systems Otherwise negative except as per HPI, including: General: Denies fever, chills, night sweats or unintended weight loss. Resp: Denies cough, wheezing Cardiac: Deniespalpitations, orthopnea, paroxysmal nocturnal dyspnea. GI: Denies abdominal pain, nausea, vomiting, diarrhea or constipation GU: Denies dysuria, frequency, hesitancy or incontinence MS: Denies muscle aches, joint pain or swelling Neuro: Denies headache, neurologic deficits (focal weakness, numbness, tingling), abnormal gait Psych: Denies anxiety, depression, SI/HI/AVH Skin: Denies new rashes or lesions ID: Denies sick contacts, exotic exposures, travel  Objective: Vitals:   07/24/19 0146 07/24/19 0333 07/24/19 0636 07/24/19 0806  BP: (!) 157/96 137/87 110/65 120/72  Pulse: 89 69 65 89  Resp: 16 20 18 20   Temp:  98.7 F (37.1 C)  98 F (36.7 C)  TempSrc:  Oral  Oral  SpO2: 95% 96% 96% 97%  Weight:      Height:        Intake/Output Summary (Last 24 hours) at 07/24/2019 1118 Last data filed at 07/24/2019 0327 Gross per 24 hour  Intake 1242.55 ml  Output --  Net 1242.55 ml   Filed Weights   07/23/19 1105  Weight: 136.1 kg    Examination:  General exam: Appears calm and comfortable  Respiratory system: Clear to auscultation. Respiratory effort normal. Cardiovascular system: S1 & S2 heard, RRR. No JVD, murmurs, rubs, gallops or clicks. No pedal edema. Gastrointestinal system: Abdomen is nondistended, soft and nontender. No organomegaly or masses felt. Normal bowel sounds heard. Central nervous system: Alert and oriented.  No focal neurological deficits. Extremities: Symmetric 5 x 5 power. Skin: No rashes, lesions or ulcers Psychiatry: Judgement and insight appear normal. Mood & affect appropriate.     Data Reviewed:   CBC: Recent Labs  Lab  07/23/19 1113 07/24/19 0633  WBC 10.1 18.6*  NEUTROABS 8.2*  --   HGB 10.0* 9.2*  HCT 33.9* 31.0*  MCV 86.7 86.6  PLT 185 0000000   Basic Metabolic Panel: Recent Labs  Lab 07/23/19 1113 07/24/19 0633  NA 137 140  K 2.8* 3.4*  CL 94* 101  CO2 30 31  GLUCOSE 120* 121*  BUN 6 10  CREATININE 0.82 0.82  CALCIUM 8.4* 8.2*  MG 1.4*  --    GFR: Estimated Creatinine Clearance: 135.6 mL/min (by C-G formula based on SCr of 0.82 mg/dL). Liver Function Tests: Recent Labs  Lab 07/23/19 1113  AST 16  ALT 9  ALKPHOS 49  BILITOT 0.8  PROT 6.2*  ALBUMIN 3.1*   No results for input(s): LIPASE, AMYLASE in the last 168 hours. No results for input(s): AMMONIA in the last 168 hours. Coagulation Profile: Recent Labs  Lab 07/23/19 1619  INR 1.1   Cardiac Enzymes: No results for input(s): CKTOTAL, CKMB, CKMBINDEX, TROPONINI in the last 168 hours. BNP (last 3 results) No results for input(s): PROBNP in the last 8760 hours. HbA1C: No results for input(s): HGBA1C in the last 72 hours. CBG: No results for input(s): GLUCAP in the last 168 hours. Lipid Profile: No results for input(s): CHOL, HDL, LDLCALC, TRIG, CHOLHDL, LDLDIRECT in the last 72 hours. Thyroid Function Tests: No results for input(s): TSH, T4TOTAL, FREET4, T3FREE, THYROIDAB in the last 72 hours. Anemia Panel: No results for input(s): VITAMINB12, FOLATE, FERRITIN, TIBC, IRON, RETICCTPCT in the last 72 hours. Sepsis Labs: Recent Labs  Lab 07/23/19 1113  LATICACIDVEN 1.4    Recent Results (from the past 240 hour(s))  SARS CORONAVIRUS 2 (TAT 6-24 HRS) Nasopharyngeal Nasopharyngeal Swab     Status: None   Collection Time: 07/23/19  1:45 PM   Specimen: Nasopharyngeal Swab  Result Value Ref Range Status   SARS Coronavirus 2 NEGATIVE NEGATIVE Final    Comment: (NOTE) SARS-CoV-2 target nucleic acids are NOT DETECTED. The SARS-CoV-2 RNA is generally detectable in upper and lower respiratory specimens during the acute  phase of infection. Negative results do not preclude SARS-CoV-2 infection, do not rule out co-infections with other pathogens, and should not be used as the sole basis for treatment or other patient management decisions. Negative results must be combined with clinical observations, patient history, and epidemiological information. The expected result is Negative. Fact Sheet for Patients: SugarRoll.be Fact Sheet for Healthcare Providers: https://www.woods-mathews.com/ This test is not yet approved or cleared by the [place] FDA and  has been authorized for detection and/or diagnosis of SARS-CoV-2 by FDA under an Emergency Use Authorization (EUA). This EUA will remain  in effect (meaning this test can be used) for the duration of the COVID-19 declaration under Section 56 4(b)(1) of the Act, 21 U.S.C. section 360bbb-3(b)(1), unless the authorization is terminated or revoked sooner. Performed at [facility], [place] [address], [place], [place] [address]          Radiology Studies: Dg Chest 2 View  Result Date: 07/23/2019 CLINICAL DATA:  Fever and shortness of breath. Right back and chest pain with inspiration. EXAM: CHEST - 2 VIEW COMPARISON:  12/01/2018 FINDINGS: Linear densities in the mid right lung. Patchy densities in the right lower chest. Right  chest densities are predominantly in the anterior aspect of the right chest. No large pleural effusions. Left lung is clear. Heart and mediastinum are within normal limits. Trachea is midline. Negative for a pneumothorax. Bone structures are unremarkable. IMPRESSION: Patchy and linear densities in the right chest. Some of the densities may represent postinflammatory changes from previous pneumonia but there is concern for acute infectious or inflammatory process. Electronically Signed   By: Markus Daft M.D.   On: 07/23/2019 12:16   Ct Angio Chest Pe W And/or Wo Contrast  Result Date:  07/23/2019 CLINICAL DATA:  Shortness of breath. EXAM: CT ANGIOGRAPHY CHEST WITH CONTRAST TECHNIQUE: Multidetector CT imaging of the chest was performed using the standard protocol during bolus administration of intravenous contrast. Multiplanar CT image reconstructions and MIPs were obtained to evaluate the vascular anatomy. CONTRAST:  62mL OMNIPAQUE IOHEXOL 350 MG/ML SOLN, 71mL OMNIPAQUE IOHEXOL 350 MG/ML SOLN COMPARISON:  November 29, 2018 FINDINGS: Cardiovascular: The thoracic aorta is nonaneurysmal without dissection or atherosclerotic change. The heart is unremarkable. Evaluation of the pulmonary arteries is somewhat limited due to timing of contrast. However, pulmonary emboli are identified. An embolus is seen within a segmental branch of the left lower lobe on axial image 125. A pulmonary embolus is seen within the segmental branch of the right lower lobe as well on axial image 145. No upper lobe emboli identified. No central emboli identified. Mediastinum/Nodes: Thyroid and esophagus are normal. Mildly prominent node in the right hilum is more prominent in the interval measuring 1.5 cm, favored to be reactive. No other adenopathy. There is a small right pleural effusion which is larger in the interval. No pericardial effusion. Lungs/Pleura: Central airways are normal. No pneumothorax. Patchy opacity is seen in the right upper and lower lobes. Much of the opacity is platelike suggesting at least some scar atelectasis. However, superimposed patchy pneumonia not excluded. No infiltrate seen in the left lung. A rounded nodular density is seen in the anterior right lung on axial image 59 measuring 1.8 cm. No other nodules or masses. Upper Abdomen: Renal stones are seen bilaterally. There is a stone in the left renal pelvis measuring up to 1.3 cm with mild hydronephrosis on the left and stranding adjacent to the renal pelvis. This is similar in appearance when compared to the November 27, 2007 study. Multiple other  stones are seen in the left kidney. There is a mass abutting the left adrenal gland measuring 5.6 cm. This mass is new when compared to a CT of the abdomen and pelvis from Jan 27, 2008. The patient is status post cholecystectomy. Musculoskeletal: No chest wall abnormality. No acute or significant osseous findings. Review of the MIP images confirms the above findings. IMPRESSION: 1. The study is positive for pulmonary emboli in the bilateral lower lobes with no evidence of strain. 2. There is a nodule along the medial right anterior lung on series 4, image 60 measuring 1.8 cm. This was not present in March of 2020. This could represent focal rounded atelectasis given the short interval follow-up but neoplasm/malignancy is not excluded on this study. Given the new mass thought to arise from the left renal gland described below, recommend a PET-CT for further evaluation of this nodule. 3. 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 The mass is nonspecific but malignancy/neoplasm not excluded. Given the new nodular density in the anterior right lung, recommend a PET-CT for further evaluation.  4. Prominent node in the right hilum. This may be reactive. Recommend attention on the recommended PET-CT. 5. Patchy opacities in the lungs. Some of the opacities are clearly atelectasis and/or scar. A superimposed multifocal infectious process is not excluded. 6. There is a stone in the left renal pelvis measuring up to 1.3 cm. Mild left hydronephrosis and fat stranding adjacent to the left renal pelvis suggests an acute component. Finding is similar when compared to 2009 however. 7. Multiple other stones in both kidneys. Aortic Atherosclerosis (ICD10-I70.0). Electronically Signed   By: Dorise Bullion III M.D   On: 07/23/2019 15:25        Scheduled Meds:  ketorolac  30 mg Intravenous Once   nicotine  21 mg Transdermal Daily   Continuous  Infusions:  heparin 2,000 Units/hr (07/24/19 0800)     LOS: 1 day   Time spent= 35 mins    Saharsh Sterling Arsenio Loader, MD [facility]  If 7PM-7AM, please contact night-coverage  07/24/2019, 11:18 AM

## 2019-07-24 NOTE — ED Notes (Addendum)
This RN spoke with Dr. Stana Bunting informed MD pt is smoker and needs a nicotine patch, received verbal orders to administered Nicotine patch 21mg  q 24hrs. RN read back verbal order to Dr. Manuella Ghazi and acknowledged.

## 2019-07-24 NOTE — Consult Note (Signed)
[place] for Heparin Infusion/Dosing Indication: pulmonary embolus  No Known Allergies  Patient Measurements: Height: 5\' 9"  (175.3 cm) Weight: 300 lb (136.1 kg) IBW/kg (Calculated) : 66.2 Heparin Dosing Weight: 98.7 kg  Vital Signs: Temp: 98.7 F (37.1 C) (11/01 0333) Temp Source: Oral (11/01 0333) BP: 110/65 (11/01 0636) Pulse Rate: 65 (11/01 0636)  Labs: Recent Labs    07/23/19 1113 07/23/19 1619 07/24/19 0633  HGB 10.0*  --  9.2*  HCT 33.9*  --  31.0*  PLT 185  --  195  APTT  --  31  --   LABPROT  --  13.8  --   INR  --  1.1  --   HEPARINUNFRC  --   --  0.16*  CREATININE 0.82  --  0.82    Estimated Creatinine Clearance: 135.6 mL/min (by C-G formula based on SCr of 0.82 mg/dL).   Medical History: Past Medical History:  Diagnosis Date  . Migraines     Medications:  (Not in a hospital admission)  Scheduled:  . heparin  3,000 Units Intravenous Once  . ketorolac  30 mg Intravenous Once   Infusions:  . heparin     PRN:  Anti-infectives (From admission, onward)   Start     Dose/Rate Route Frequency Ordered Stop   07/23/19 1545  levofloxacin (LEVAQUIN) IVPB 750 mg     750 mg 100 mL/hr over 90 Minutes Intravenous  Once 07/23/19 1537 07/23/19 1749   07/23/19 1315  levofloxacin (LEVAQUIN) IVPB 750 mg  Status:  Discontinued     750 mg 100 mL/hr over 90 Minutes Intravenous  Once 07/23/19 1310 07/23/19 1319      Assessment: Pharmacy consulted to initiate heparin drip in 38yo patient. Patient has no PTA history of anticoagulant use. Will initiate therapy immediately. Baseline labs have been ordered and are pending.  Goal of Therapy:  Heparin level 0.3-0.7 units/ml Monitor platelets by anticoagulation protocol: Yes   Plan:  Give 6000 units bolus x 1 Start heparin infusion at 1700 units/hr Check anti-Xa level in 6 hours and daily while on heparin Continue to monitor H&H and platelets   11/01 @ 0633 HL= 0.16. Will  order bolus of 3000 units and increase drip to 2000 units/hr. Will recheck in 6 hours.  Yarden Manuelito A 07/24/2019,7:30 AM

## 2019-07-24 NOTE — ED Notes (Signed)
Pt family here with food - take out

## 2019-07-24 NOTE — ED Notes (Signed)
Pt calling out for pain medicine. Pt states pain 8/10. Pt given morphine. Will continue ot monitor.

## 2019-07-24 NOTE — ED Notes (Signed)
Pt moved to bed from chair

## 2019-07-24 NOTE — Progress Notes (Signed)
*  PRELIMINARY RESULTS* Echocardiogram 2D Echocardiogram has been performed.  Sherrie Sport 07/24/2019, 2:44 PM

## 2019-07-24 NOTE — ED Notes (Signed)
RN paged prime MD on call to obtain order for nicotine patch for pt as pt reports she is a current every day smoker and needs something to help with these cravings.

## 2019-07-24 NOTE — ED Notes (Signed)
Pt up to restroom independently.

## 2019-07-24 NOTE — Consult Note (Signed)
[place]  [phone] [phone]  ID: Katrina Page OB: 02/15/1981  MR#: [id number]  [id number]  Patient Care Team: Patient, No Pcp Per as PCP - General (General Practice)  CHIEF COMPLAINT: Bilateral pulmonary embolism, lung nodule.  INTERVAL HISTORY: Patient is a 38 year old female who came into the hospital with pleuritic chest pain and increasing shortness of breath.  Subsequent CT scan revealed bilateral pulmonary embolism.  She continues to be symptomatic.  She states she "always feels bad".  She denies any recent travel or surgeries.  She has no neurologic complaints.  She denies any recent fevers or illnesses.  She has good appetite and denies weight loss.  She denies any cough or hemoptysis.  She has no nausea, vomiting, constipation, or diarrhea.  She has no urinary complaints.  Patient otherwise feels well and offers no further specific complaints today.  REVIEW OF SYSTEMS:   Review of Systems  Constitutional: Negative.  Negative for fever, malaise/fatigue and weight loss.  Respiratory: Positive for shortness of breath. Negative for cough and hemoptysis.   Cardiovascular: Positive for chest pain. Negative for leg swelling.  Gastrointestinal: Negative.  Negative for abdominal pain, blood in stool and melena.  Genitourinary: Negative.  Negative for hematuria.  Musculoskeletal: Negative.  Negative for back pain.  Skin: Negative.  Negative for rash.  Neurological: Negative.  Negative for dizziness, focal weakness, weakness and headaches.  Psychiatric/Behavioral: Negative.  The patient is not nervous/anxious.     As per HPI. Otherwise, a complete review of systems is negative.  PAST MEDICAL HISTORY: Past Medical History:  Diagnosis Date  . Migraines     PAST SURGICAL HISTORY: Past Surgical History:  Procedure Laterality Date  . CHOLECYSTECTOMY    . TUBAL LIGATION      FAMILY HISTORY: Family History  Problem Relation Age  of Onset  . Hypertension Mother   . Diabetes Mother   . Stroke Mother   . Breast cancer Mother     ADVANCED DIRECTIVES (Y/N):  @ADVDIR @  HEALTH MAINTENANCE: Social History   Tobacco Use  . Smoking status: Current Every Day Smoker    Packs/day: 0.50    Years: 20.00    Pack years: 10.00    Types: Cigarettes  . Smokeless tobacco: Never Used  Substance Use Topics  . Alcohol use: Yes    Comment: 2-3 week.    . Drug use: No     Colonoscopy:  PAP:  Bone density:  Lipid panel:  No Known Allergies  Current Facility-Administered Medications  Medication Dose Route Frequency Provider Last Rate Last Dose  . acetaminophen (TYLENOL) tablet 650 mg  650 mg Oral Q6H PRN Henreitta Leber, MD   650 mg at 07/24/19 0331   Or  . acetaminophen (TYLENOL) suppository 650 mg  650 mg Rectal Q6H PRN Henreitta Leber, MD      . heparin ADULT infusion 100 units/mL (25000 units/272mL sodium chloride 0.45%)  2,000 Units/hr Intravenous Continuous Henreitta Leber, MD 20 mL/hr at 07/24/19 0800 2,000 Units/hr at 07/24/19 0800  . ketorolac (TORADOL) 30 MG/ML injection 30 mg  30 mg Intravenous Q6H PRN Henreitta Leber, MD   30 mg at 07/24/19 0325  . ketorolac (TORADOL) 30 MG/ML injection 30 mg  30 mg Intravenous Once Amin, Ankit Chirag, MD      . morphine 2 MG/ML injection 2 mg  2 mg Intravenous Q4H PRN Henreitta Leber, MD   2 mg at 07/24/19 0147  . nicotine (NICODERM CQ -  dosed in mg/24 hours) patch 21 mg  21 mg Transdermal Daily Max Sane, MD   21 mg at 07/24/19 0929  . ondansetron (ZOFRAN) tablet 4 mg  4 mg Oral Q6H PRN Henreitta Leber, MD       Or  . ondansetron (ZOFRAN) injection 4 mg  4 mg Intravenous Q6H PRN Henreitta Leber, MD      . oxyCODONE (Oxy IR/ROXICODONE) immediate release tablet 5 mg  5 mg Oral Q6H PRN Damita Lack, MD   5 mg at 07/24/19 0732  . polyethylene glycol (MIRALAX / GLYCOLAX) packet 17 g  17 g Oral Daily PRN Amin, Ankit Chirag, MD      . senna-docusate (Senokot-S)  tablet 2 tablet  2 tablet Oral QHS PRN Amin, Jeanella Flattery, MD       Current Outpatient Medications  Medication Sig Dispense Refill  . acetaminophen (TYLENOL) 325 MG tablet Take 650 mg by mouth every 6 (six) hours as needed for mild pain or fever.     Marland Kitchen albuterol (PROVENTIL HFA;VENTOLIN HFA) 108 (90 Base) MCG/ACT inhaler Inhale 2 puffs into the lungs every 4 (four) hours as needed for wheezing or shortness of breath. (Patient not taking: Reported on 07/23/2019) 1 Inhaler 0  . dextromethorphan-guaiFENesin (TUSSIN DM) 10-100 MG/5ML liquid Take 5 mLs by mouth every 6 (six) hours as needed for cough. (Patient not taking: Reported on 07/23/2019) 118 mL 0  . nabumetone (RELAFEN) 750 MG tablet Take 1 tablet (750 mg total) by mouth 2 (two) times daily. (Patient not taking: Reported on 07/23/2019) 30 tablet 0    OBJECTIVE: Vitals:   07/24/19 0636 07/24/19 0806  BP: 110/65 120/72  Pulse: 65 89  Resp: 18 20  Temp:  98 F (36.7 C)  SpO2: 96% 97%     Body mass index is 44.3 kg/m.    ECOG FS:1 - Symptomatic but completely ambulatory  General: Well-developed, well-nourished, no acute distress. Eyes: Pink conjunctiva, anicteric sclera. HEENT: Normocephalic, moist mucous membranes, clear oropharnyx. Lungs: Clear to auscultation bilaterally. Heart: Regular rate and rhythm. No rubs, murmurs, or gallops. Abdomen: Soft, nontender, nondistended. No organomegaly noted, normoactive bowel sounds. Musculoskeletal: No edema, cyanosis, or clubbing. Neuro: Alert, answering all questions appropriately. Cranial nerves grossly intact. Skin: No rashes or petechiae noted. Psych: Normal affect. Lymphatics: No cervical, calvicular, axillary or inguinal LAD.   LAB RESULTS:  Lab Results  Component Value Date   NA 140 07/24/2019   K 3.4 (L) 07/24/2019   CL 101 07/24/2019   CO2 31 07/24/2019   GLUCOSE 121 (H) 07/24/2019   BUN 10 07/24/2019   CREATININE 0.82 07/24/2019   CALCIUM 8.2 (L) 07/24/2019   PROT 6.2  (L) 07/23/2019   ALBUMIN 3.1 (L) 07/23/2019   AST 16 07/23/2019   ALT 9 07/23/2019   ALKPHOS 49 07/23/2019   BILITOT 0.8 07/23/2019   GFRNONAA >60 07/24/2019   GFRAA >60 07/24/2019    Lab Results  Component Value Date   WBC 18.6 (H) 07/24/2019   NEUTROABS 8.2 (H) 07/23/2019   HGB 9.2 (L) 07/24/2019   HCT 31.0 (L) 07/24/2019   MCV 86.6 07/24/2019   PLT 195 07/24/2019     STUDIES: Dg Chest 2 View  Result Date: 07/23/2019 CLINICAL DATA:  Fever and shortness of breath. Right back and chest pain with inspiration. EXAM: CHEST - 2 VIEW COMPARISON:  12/01/2018 FINDINGS: Linear densities in the mid right lung. Patchy densities in the right lower chest. Right chest densities are predominantly in  the anterior aspect of the right chest. No large pleural effusions. Left lung is clear. Heart and mediastinum are within normal limits. Trachea is midline. Negative for a pneumothorax. Bone structures are unremarkable. IMPRESSION: Patchy and linear densities in the right chest. Some of the densities may represent postinflammatory changes from previous pneumonia but there is concern for acute infectious or inflammatory process. Electronically Signed   By: Markus Daft M.D.   On: 07/23/2019 12:16   Ct Angio Chest Pe W And/or Wo Contrast  Result Date: 07/23/2019 CLINICAL DATA:  Shortness of breath. EXAM: CT ANGIOGRAPHY CHEST WITH CONTRAST TECHNIQUE: Multidetector CT imaging of the chest was performed using the standard protocol during bolus administration of intravenous contrast. Multiplanar CT image reconstructions and MIPs were obtained to evaluate the vascular anatomy. CONTRAST:  89mL OMNIPAQUE IOHEXOL 350 MG/ML SOLN, 45mL OMNIPAQUE IOHEXOL 350 MG/ML SOLN COMPARISON:  November 29, 2018 FINDINGS: Cardiovascular: The thoracic aorta is nonaneurysmal without dissection or atherosclerotic change. The heart is unremarkable. Evaluation of the pulmonary arteries is somewhat limited due to timing of contrast. However,  pulmonary emboli are identified. An embolus is seen within a segmental branch of the left lower lobe on axial image 125. A pulmonary embolus is seen within the segmental branch of the right lower lobe as well on axial image 145. No upper lobe emboli identified. No central emboli identified. Mediastinum/Nodes: Thyroid and esophagus are normal. Mildly prominent node in the right hilum is more prominent in the interval measuring 1.5 cm, favored to be reactive. No other adenopathy. There is a small right pleural effusion which is larger in the interval. No pericardial effusion. Lungs/Pleura: Central airways are normal. No pneumothorax. Patchy opacity is seen in the right upper and lower lobes. Much of the opacity is platelike suggesting at least some scar atelectasis. However, superimposed patchy pneumonia not excluded. No infiltrate seen in the left lung. A rounded nodular density is seen in the anterior right lung on axial image 59 measuring 1.8 cm. No other nodules or masses. Upper Abdomen: Renal stones are seen bilaterally. There is a stone in the left renal pelvis measuring up to 1.3 cm with mild hydronephrosis on the left and stranding adjacent to the renal pelvis. This is similar in appearance when compared to the November 27, 2007 study. Multiple other stones are seen in the left kidney. There is a mass abutting the left adrenal gland measuring 5.6 cm. This mass is new when compared to a CT of the abdomen and pelvis from Jan 27, 2008. The patient is status post cholecystectomy. Musculoskeletal: No chest wall abnormality. No acute or significant osseous findings. Review of the MIP images confirms the above findings. IMPRESSION: 1. The study is positive for pulmonary emboli in the bilateral lower lobes with no evidence of strain. 2. There is a nodule along the medial right anterior lung on series 4, image 60 measuring 1.8 cm. This was not present in March of 2020. This could represent focal rounded atelectasis given  the short interval follow-up but neoplasm/malignancy is not excluded on this study. Given the new mass thought to arise from the left renal gland described below, recommend a PET-CT for further evaluation of this nodule. 3. 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 The mass is nonspecific but malignancy/neoplasm not excluded. Given the new nodular density in the anterior right lung, recommend a PET-CT for further evaluation. 4. Prominent node in the  right hilum. This may be reactive. Recommend attention on the recommended PET-CT. 5. Patchy opacities in the lungs. Some of the opacities are clearly atelectasis and/or scar. A superimposed multifocal infectious process is not excluded. 6. There is a stone in the left renal pelvis measuring up to 1.3 cm. Mild left hydronephrosis and fat stranding adjacent to the left renal pelvis suggests an acute component. Finding is similar when compared to 2009 however. 7. Multiple other stones in both kidneys. Aortic Atherosclerosis (ICD10-I70.0). Electronically Signed   By: Dorise Bullion III M.D   On: 07/23/2019 15:25    ASSESSMENT: Bilateral pulmonary embolism, lung nodule.  PLAN:    1.  Bilateral pulmonary embolism: Patient does not appear to have any transient risk factors, although her pregnancy test was positive.  Patient apparently is not aware of her positive results, upon questioning she denied being pregnant.  Will initiate hypercoagulable work-up today.  Results should be interpreted with caution since patient is on heparin drip.  Because of pregnancy, patient cannot be transitioned to Eliquis or Xarelto.  She will require Lovenox injections for the duration of the pregnancy and postpartum period.  No further intervention is needed.  Will arrange follow-up in the cancer center to discuss her results. 2.  Right lung nodule: CT scan results from July 23, 2019 reviewed independently and  reported as above with a 1.8 cm lesion in the right anterior lung that was not present on imaging in March 2020.  Given the left adrenal mass, will consider PET scan as an outpatient.  No further intervention is needed during this admission.  Follow-up in the cancer center as above. 3.  Left adrenal nodule: Consider PET scan as above. 4.  Pregnancy: Patient noted to have a positive pregnancy test, although denied being pregnant.  If confirmed, patient will require Lovenox throughout the duration of her pregnancy.  Appreciate consult, will follow.  Lloyd Huger, MD   07/24/2019 10:16 AM

## 2019-07-24 NOTE — ED Notes (Signed)
Pt given meal tray. Pt sitting in recliner.

## 2019-07-25 ENCOUNTER — Inpatient Hospital Stay: Payer: Self-pay

## 2019-07-25 DIAGNOSIS — N132 Hydronephrosis with renal and ureteral calculous obstruction: Secondary | ICD-10-CM

## 2019-07-25 DIAGNOSIS — N138 Other obstructive and reflux uropathy: Secondary | ICD-10-CM

## 2019-07-25 LAB — URINALYSIS, COMPLETE (UACMP) WITH MICROSCOPIC
Bacteria, UA: NONE SEEN
Bilirubin Urine: NEGATIVE
Glucose, UA: NEGATIVE mg/dL
Hgb urine dipstick: NEGATIVE
Ketones, ur: NEGATIVE mg/dL
Nitrite: NEGATIVE
Protein, ur: NEGATIVE mg/dL
Specific Gravity, Urine: 1.01 (ref 1.005–1.030)
pH: 7 (ref 5.0–8.0)

## 2019-07-25 LAB — MAGNESIUM: Magnesium: 1.8 mg/dL (ref 1.7–2.4)

## 2019-07-25 LAB — BASIC METABOLIC PANEL
Anion gap: 10 (ref 5–15)
BUN: 8 mg/dL (ref 6–20)
CO2: 30 mmol/L (ref 22–32)
Calcium: 8.2 mg/dL — ABNORMAL LOW (ref 8.9–10.3)
Chloride: 103 mmol/L (ref 98–111)
Creatinine, Ser: 0.8 mg/dL (ref 0.44–1.00)
GFR calc Af Amer: >60 mL/min (ref >60)
GFR calc non Af Amer: >60 mL/min (ref >60)
Glucose, Bld: 84 mg/dL (ref 70–99)
Potassium: 3 mmol/L — ABNORMAL LOW (ref 3.5–5.1)
Sodium: 143 mmol/L (ref 135–145)

## 2019-07-25 LAB — CBC
HCT: 30.2 % — ABNORMAL LOW (ref 36.0–46.0)
Hemoglobin: 8.7 g/dL — ABNORMAL LOW (ref 12.0–15.0)
MCH: 25.4 pg — ABNORMAL LOW (ref 26.0–34.0)
MCHC: 28.8 g/dL — ABNORMAL LOW (ref 30.0–36.0)
MCV: 88 fL (ref 80.0–100.0)
Platelets: 206 10*3/uL (ref 150–400)
RBC: 3.43 MIL/uL — ABNORMAL LOW (ref 3.87–5.11)
RDW: 22 % — ABNORMAL HIGH (ref 11.5–15.5)
WBC: 10.2 10*3/uL (ref 4.0–10.5)
nRBC: 0 % (ref 0.0–0.2)

## 2019-07-25 LAB — BETA-2-GLYCOPROTEIN I ABS, IGG/M/A
Beta-2 Glyco I IgG: <9 GPI IgG units (ref 0–20)
Beta-2-Glycoprotein I IgA: <9 GPI IgA units (ref 0–25)
Beta-2-Glycoprotein I IgM: <9 GPI IgM units (ref 0–32)

## 2019-07-25 MED ORDER — ENOXAPARIN SODIUM 100 MG/ML ~~LOC~~ SOLN
1.0000 mg/kg | Freq: Two times a day (BID) | SUBCUTANEOUS | Status: AC
  Administered 2019-07-25: 100 mg via SUBCUTANEOUS
  Filled 2019-07-25: qty 1

## 2019-07-25 MED ORDER — POTASSIUM CHLORIDE CRYS ER 20 MEQ PO TBCR
40.0000 meq | EXTENDED_RELEASE_TABLET | Freq: Once | ORAL | Status: AC
  Administered 2019-07-25: 40 meq via ORAL
  Filled 2019-07-25: qty 2

## 2019-07-25 MED ORDER — ENOXAPARIN SODIUM 100 MG/ML ~~LOC~~ SOLN
1.0000 mg/kg | Freq: Two times a day (BID) | SUBCUTANEOUS | Status: DC
  Filled 2019-07-25: qty 1

## 2019-07-25 MED ORDER — RIVAROXABAN 20 MG PO TABS: 20.0000 mg | ORAL_TABLET | Freq: Every day | ORAL | Status: DC

## 2019-07-25 MED ORDER — ENSURE ENLIVE PO LIQD
237.0000 mL | Freq: Two times a day (BID) | ORAL | Status: DC
  Administered 2019-07-26: 08:00:00 237 mL via ORAL

## 2019-07-25 MED ORDER — OXYCODONE HCL 5 MG PO TABS
10.0000 mg | ORAL_TABLET | Freq: Four times a day (QID) | ORAL | Status: DC | PRN
  Administered 2019-07-25 – 2019-07-26 (×3): 10 mg via ORAL
  Filled 2019-07-25 (×3): qty 2

## 2019-07-25 MED ORDER — RIVAROXABAN 15 MG PO TABS
15.0000 mg | ORAL_TABLET | Freq: Two times a day (BID) | ORAL | Status: DC
  Administered 2019-07-25 – 2019-07-26 (×2): 15 mg via ORAL
  Filled 2019-07-25 (×3): qty 1

## 2019-07-25 MED ORDER — ADULT MULTIVITAMIN W/MINERALS CH
1.0000 | ORAL_TABLET | Freq: Every day | ORAL | Status: DC
  Administered 2019-07-26: 1 via ORAL
  Filled 2019-07-25: qty 1

## 2019-07-25 NOTE — Progress Notes (Signed)
ANTICOAGULATION CONSULT NOTE - Initial Consult  Pharmacy Consult for Xarelto Indication: pulmonary embolus  No Known Allergies  Patient Measurements: Height: 5\' 9"  (175.3 cm) Weight: 221 lb 1.9 oz (100.3 kg) IBW/kg (Calculated) : 66.2 Heparin Dosing Weight:    Vital Signs: Temp: 98 F (36.7 C) (11/02 0410) Temp Source: Oral (11/02 0410) BP: 135/79 (11/02 0410) Pulse Rate: 62 (11/02 0410)  Labs: Recent Labs    07/23/19 1113 07/23/19 1619 07/24/19 0633 07/24/19 1440 07/25/19 0429  HGB 10.0*  --  9.2*  --  8.7*  HCT 33.9*  --  31.0*  --  30.2*  PLT 185  --  195  --  206  APTT  --  31  --   --   --   LABPROT  --  13.8  --   --   --   INR  --  1.1  --   --   --   HEPARINUNFRC  --   --  0.16* 0.62  --   CREATININE 0.82  --  0.82  --  0.80    Estimated Creatinine Clearance: 117.8 mL/min (by C-G formula based on SCr of 0.8 mg/dL).   Medical History: Past Medical History:  Diagnosis Date  . Migraines     Medications:  Scheduled:  . enoxaparin (LOVENOX) injection  1 mg/kg Subcutaneous Q12H  . ketorolac  30 mg Intravenous Once  . nicotine  21 mg Transdermal Daily  . potassium chloride  40 mEq Oral Once  . rivaroxaban  15 mg Oral BID WC   Followed by  . [START ON 08/15/2019] rivaroxaban  20 mg Oral Q supper  . tamsulosin  0.4 mg Oral QPC breakfast   Infusions:  . sodium chloride 75 mL/hr at 07/25/19 0400    Assessment: 38 yo F on Lovenox treatment dose 1mg /kg q12h for Pulmonary Embolism. Patient has no PTA history of anticoagulant use.  Goal of Therapy:  Monitor platelets by anticoagulation protocol: Yes   Plan:  Will transition to [place] with evening meal today 11/2. Start Xarelto 15 mg po BID with food x 21 days then Xarelto 20 mg po with supper for PE. Will discontinue Lovenox order at time of start of Xarelto.  Katrina Page A 07/25/2019,8:58 AM

## 2019-07-25 NOTE — Progress Notes (Signed)
SATURATION QUALIFICATIONS: (This note is used to comply with regulatory documentation for home oxygen)  Patient Saturations on Room Air at Rest = 100%  Patient Saturations on Room Air while Ambulating = 100%  

## 2019-07-25 NOTE — Consult Note (Signed)
ANTICOAGULATION CONSULT NOTE   Pharmacy Consult for Enoxaparin Dosing Indication: pulmonary embolus  No Known Allergies  Patient Measurements: Height: 5\' 9"  (175.3 cm) Weight: 220 lb 3.2 oz (99.9 kg) IBW/kg (Calculated) : 66.2 Heparin Dosing Weight: 98.7 kg  Vital Signs: Temp: 98 F (36.7 C) (11/02 0410) Temp Source: Oral (11/02 0410) BP: 135/79 (11/02 0410) Pulse Rate: 62 (11/02 0410)  Labs: Recent Labs    07/23/19 1113 07/23/19 1619 07/24/19 0633 07/24/19 1440 07/25/19 0429  HGB 10.0*  --  9.2*  --  8.7*  HCT 33.9*  --  31.0*  --  30.2*  PLT 185  --  195  --  206  APTT  --  31  --   --   --   LABPROT  --  13.8  --   --   --   INR  --  1.1  --   --   --   HEPARINUNFRC  --   --  0.16* 0.62  --   CREATININE 0.82  --  0.82  --  0.80    Estimated Creatinine Clearance: 117.6 mL/min (by C-G formula based on SCr of 0.8 mg/dL).   Medical History: Past Medical History:  Diagnosis Date  . Migraines     Medications:  Medications Prior to Admission  Medication Sig Dispense Refill Last Dose  . acetaminophen (TYLENOL) 325 MG tablet Take 650 mg by mouth every 6 (six) hours as needed for mild pain or fever.    Unknown at PRN  . albuterol (PROVENTIL HFA;VENTOLIN HFA) 108 (90 Base) MCG/ACT inhaler Inhale 2 puffs into the lungs every 4 (four) hours as needed for wheezing or shortness of breath. (Patient not taking: Reported on 07/23/2019) 1 Inhaler 0 Not Taking at Unknown time  . dextromethorphan-guaiFENesin (TUSSIN DM) 10-100 MG/5ML liquid Take 5 mLs by mouth every 6 (six) hours as needed for cough. (Patient not taking: Reported on 07/23/2019) 118 mL 0 Not Taking at Unknown time  . nabumetone (RELAFEN) 750 MG tablet Take 1 tablet (750 mg total) by mouth 2 (two) times daily. (Patient not taking: Reported on 07/23/2019) 30 tablet 0 Not Taking at Unknown time   Scheduled:  . enoxaparin (LOVENOX) injection  1 mg/kg Subcutaneous Q12H  . ketorolac  30 mg Intravenous Once  .  nicotine  21 mg Transdermal Daily  . potassium chloride  40 mEq Oral Once  . tamsulosin  0.4 mg Oral QPC breakfast   Infusions:  . sodium chloride 75 mL/hr at 07/25/19 0400   PRN:  Anti-infectives (From admission, onward)   Start     Dose/Rate Route Frequency Ordered Stop   07/23/19 1545  levofloxacin (LEVAQUIN) IVPB 750 mg     750 mg 100 mL/hr over 90 Minutes Intravenous  Once 07/23/19 1537 07/23/19 1749   07/23/19 1315  levofloxacin (LEVAQUIN) IVPB 750 mg  Status:  Discontinued     750 mg 100 mL/hr over 90 Minutes Intravenous  Once 07/23/19 1310 07/23/19 1319      Assessment: Pharmacy consulted to initiate heparin drip in 38yo patient. Patient has no PTA history of anticoagulant use. Will initiate therapy immediately. Baseline labs have been ordered and are pending.  11/01: Hospitalist would like to transition to therapeutic dose enoxaparin and discontinue heparin drip. 11/2: Per MD plan to start Xarelto this evening  Goal of Therapy:  Monitor platelets by anticoagulation protocol: Yes   Plan:  Weight change 136kg to 99 kg-Will adjust lovenox 1mg /kg Q12 hours. Hemoglobin and platelets remain  stable currently. Will continue to monitor H&H and platelets daily with AM labs.  Gilles Trimpe A 07/25/2019,8:41 AM

## 2019-07-25 NOTE — Progress Notes (Signed)
Initial Nutrition Assessment  DOCUMENTATION CODES:   Obesity unspecified  INTERVENTION:   Ensure Enlive po BID, each supplement provides 350 kcal and 20 grams of protein  MVI daily   NUTRITION DIAGNOSIS:   Inadequate oral intake related to decreased appetite as evidenced by meal completion < 50%.  GOAL:   Patient will meet greater than or equal to 90% of their needs  MONITOR:   PO intake, Supplement acceptance, Labs, Weight trends, Skin, I & O's  REASON FOR ASSESSMENT:   Malnutrition Screening Tool    ASSESSMENT:   38 year old with a history of tobacco use, migraines came to the hospital complains of shortness of breath and atypical chest pain.  Pt found to have bilateral lower lobe pulmonary embolism without cor pulmonale, multiple nodules including lung and a mass in the left adrenal and renal stone   Pt reports decreased appetite and oral intake over the past several years. Pt reports trying to eat 2-3 meals per day but often finishing ~25% of the meal. Pt does enjoy protein supplements but is unable to afford these at home. Pt reports a slow decline in her weight over the past several years. Per chart, pt has lost 30lbs(12%) over the past year; this is not significant. Pt's UBW appears to be ~250lbs. RD will add supplements and MVI to help pt meet her estimated needs. Pt currently documented to be eating 15% of meals.   Medications reviewed and include: nicotine  Labs reviewed: K 3.0(L), Mg 1.8 wnl Hgb 8.7(L), Hct 30.2(L), MCH 25.4(L), MCHC 28.8(L)  NUTRITION - FOCUSED PHYSICAL EXAM:    Most Recent Value  Orbital Region  No depletion  Upper Arm Region  No depletion  Thoracic and Lumbar Region  No depletion  Buccal Region  No depletion  Temple Region  No depletion  Clavicle Bone Region  No depletion  Clavicle and Acromion Bone Region  No depletion  Scapular Bone Region  No depletion  Dorsal Hand  No depletion  Patellar Region  No depletion  Anterior Thigh  Region  No depletion  Posterior Calf Region  No depletion  Edema (RD Assessment)  None  Hair  Reviewed  Eyes  Reviewed  Mouth  Reviewed  Skin  Reviewed  Nails  Reviewed     Diet Order:   Diet Order            Diet regular Room service appropriate? Yes; Fluid consistency: Thin  Diet effective now             EDUCATION NEEDS:   Education needs have been addressed  Skin:  Skin Assessment: Reviewed RN Assessment  Last BM:  10/30  Height:   Ht Readings from Last 1 Encounters:  07/24/19 5\' 9"  (1.753 m)    Weight:   Wt Readings from Last 1 Encounters:  07/25/19 100.3 kg    Ideal Body Weight:  65.9 kg  BMI:  Body mass index is 32.65 kg/m.  Estimated Nutritional Needs:   Kcal:  1900-2200kcal/day  Protein:  95-110g/day  Fluid:  >2L/day  Koleen Distance MS, RD, LDN Pager #- [phone] Office#- [phone] After Hours Pager: [phone]

## 2019-07-25 NOTE — Consult Note (Signed)
Urology Consult  I have been asked to see the patient by Dr. Reesa Chew, for evaluation and management of an incidental 1.3cm left renal pelvic stone.  Chief Complaint: See above  History of Present Illness: Katrina Page is a 38 y.o. year old female admitted on 07/23/2019 with bilateral lower lobe PE without cor pulmonale. CT angiogram revealed incidental lung nodules, a left adrenal mass, and a 1.3cm left renal pelvic stone with mild left hydronephrosis.  Follow-up renal ultrasound without evidence of hydronephrosis.  Creatinine 0.80, WBC count 10.2.  UA significant for 21-50 WBCs/hpf but otherwise negative.  Chart review reveals an 8 x 12 mm left UPJ stone with associated moderate hydronephrosis and perinephric stranding in March 2009.  Radiology notes from [facility] that month indicate that the patient underwent left ureteral stenting and was scheduled for PCNL for management of this at that time.  Patient reports the surgery was completed and the stone was removed.  No operative notes available.  Her next available abdominal imaging from July 2017 reveals a "12 mm somewhat triangular density. in the expected region of the left renal pelvis similar to that seen on prior CT examination."  Patient reports significant pain that she initially attributes to sciatica.  She describes the pain as spanning her bilateral lumbar region, R>L, with associated lower extremity numbness and tingling.  The pain is worsened with prolonged sitting.  She states she has seen some blood in her urine, however she is unsure if this is associated with irregular menses.  She reports some intermittent, mild dysuria over the past 6 months.  She denies urgency or frequency.   Past Medical History:  Diagnosis Date  . Migraines     Past Surgical History:  Procedure Laterality Date  . CHOLECYSTECTOMY    . TUBAL LIGATION      Home Medications:  Current Meds  Medication Sig  . acetaminophen (TYLENOL) 325 MG tablet Take  650 mg by mouth every 6 (six) hours as needed for mild pain or fever.     Allergies: No Known Allergies  Family History  Problem Relation Age of Onset  . Hypertension Mother   . Diabetes Mother   . Stroke Mother   . Breast cancer Mother     Social History:  reports that she has been smoking cigarettes. She has a 10.00 pack-year smoking history. She has never used smokeless tobacco. She reports current alcohol use. She reports that she does not use drugs.  ROS: A complete review of systems was performed.  All systems are negative except for pertinent findings as noted.  Physical Exam:  Vital signs in last 24 hours: Temp:  [98 F (36.7 C)-98.7 F (37.1 C)] 98.4 F (36.9 C) (11/02 1525) Pulse Rate:  [62-85] 76 (11/02 1525) Resp:  [15] 15 (11/02 0410) BP: (135-154)/(79-99) 154/99 (11/02 1525) SpO2:  [98 %-100 %] 98 % (11/02 1525) Weight:  [99.9 kg-100.3 kg] 100.3 kg (11/02 0855) Constitutional:  Alert and oriented, no acute distress HEENT: [place] AT, moist mucus membranes Cardiovascular: No clubbing, cyanosis, or edema Respiratory: Normal respiratory effort Skin: No rashes, bruises or suspicious lesions Neurologic: Grossly intact, no focal deficits, moving all 4 extremities Psychiatric: Depressed mood and flat affect  Laboratory Data:  Recent Labs    07/23/19 1113 07/24/19 0633 07/25/19 0429  WBC 10.1 18.6* 10.2  HGB 10.0* 9.2* 8.7*  HCT 33.9* 31.0* 30.2*   Recent Labs    07/23/19 1113 07/24/19 0633 07/25/19 0429  NA 137  140 143  K 2.8* 3.4* 3.0*  CL 94* 101 103  CO2 30 31 30   GLUCOSE 120* 121* 84  BUN 6 10 8   CREATININE 0.82 0.82 0.80  CALCIUM 8.4* 8.2* 8.2*   Urinalysis    Component Value Date/Time   COLORURINE YELLOW (A) 07/25/2019 1020   APPEARANCEUR CLEAR (A) 07/25/2019 1020   APPEARANCEUR Hazy 01/03/2013 1856   LABSPEC 1.010 07/25/2019 1020   LABSPEC 1.024 01/03/2013 1856   PHURINE 7.0 07/25/2019 1020   GLUCOSEU NEGATIVE 07/25/2019 1020   GLUCOSEU  Negative 01/03/2013 1856   HGBUR NEGATIVE 07/25/2019 1020   BILIRUBINUR NEGATIVE 07/25/2019 1020   BILIRUBINUR Negative 01/03/2013 1856   KETONESUR NEGATIVE 07/25/2019 1020   PROTEINUR NEGATIVE 07/25/2019 1020   NITRITE NEGATIVE 07/25/2019 1020   LEUKOCYTESUR MODERATE (A) 07/25/2019 1020   LEUKOCYTESUR 1+ 01/03/2013 1856   Results for orders placed or performed during the hospital encounter of 07/23/19  SARS CORONAVIRUS 2 (TAT 6-24 HRS) Nasopharyngeal Nasopharyngeal Swab     Status: None   Collection Time: 07/23/19  1:45 PM   Specimen: Nasopharyngeal Swab  Result Value Ref Range Status   SARS Coronavirus 2 NEGATIVE NEGATIVE Final    Comment: (NOTE) SARS-CoV-2 target nucleic acids are NOT DETECTED. The SARS-CoV-2 RNA is generally detectable in upper and lower respiratory specimens during the acute phase of infection. Negative results do not preclude SARS-CoV-2 infection, do not rule out co-infections with other pathogens, and should not be used as the sole basis for treatment or other patient management decisions. Negative results must be combined with clinical observations, patient history, and epidemiological information. The expected result is Negative. Fact Sheet for Patients: SugarRoll.be Fact Sheet for Healthcare Providers: https://www.woods-mathews.com/ This test is not yet approved or cleared by the [place] FDA and  has been authorized for detection and/or diagnosis of SARS-CoV-2 by FDA under an Emergency Use Authorization (EUA). This EUA will remain  in effect (meaning this test can be used) for the duration of the COVID-19 declaration under Section 56 4(b)(1) of the Act, 21 U.S.C. section 360bbb-3(b)(1), unless the authorization is terminated or revoked sooner. Performed at [facility], [place] [address], [place], [place] [address]    Radiologic Imaging: US Renal  Result Date: 07/25/2019 CLINICAL DATA:   Obstructive nephropathy EXAM: RENAL / URINARY TRACT ULTRASOUND COMPLETE COMPARISON:  None. FINDINGS: Right Kidney: Renal measurements: 11.6 x 4.4 x 4.7 cm = volume: 125.8 mL. Normal renal cortical thickness and echogenicity. No hydronephrosis. There is a simple appearing 2.0 x 1.7 x 1.7 cm lower pole cyst. Left Kidney: Renal measurements: 10.3 x 4.9 x 4.7 cm = volume: 124.2 mL. Normal renal cortical thickness and echogenicity without focal lesions or hydronephrosis. Bladder: Appears normal for degree of bladder distention. Other: None. IMPRESSION: Unremarkable renal ultrasound examination. No worrisome renal lesions or hydronephrosis. Simple appearing lower pole right renal cyst. Electronically Signed   By: Marijo Sanes M.D.   On: 07/25/2019 11:54   I personally reviewed the imaging above and note the absence of hydronephrosis.  Assessment & Plan:  38 year old female admitted with bilateral PE with incidental findings of a 1.3 cm left renal stone with mild hydronephrosis, resolved per follow-up renal ultrasound.  Patient previously received care for a 1.2 cm left renal calculus in 2009.  She states this stone was treated via PCNL.  It is unclear if patient is mistaken and her current incidental stone is the same from 2009 or if she has developed a similar stone  in the interim.    Her renal function is at baseline.  UA with mild pyuria, however patient does not report significant irritative voiding symptoms.  Do not recommend urine culture at this time.  I counseled patient that her stone appears to be nonobstructing and that urgent intervention is not warranted.  I advised her that we would be happy to see her on an outpatient basis upon discharge to discuss management for this stone.  Patient is very concerned that the stone is the cause of her back pain and lower extremity numbness.  I repeatedly explained that the pain she is describing is not consistent with the type of pain we would expect with a  left renal stone and that I do not suspect the stone is the cause of her pain.  She is very perseverant on treatment of her stone offering a possible cure of her chronic pain.  I reiterated that I do not believe that she will experience resolution of her lumbar back pain and lower extremity numbness with treatment of the stone.  Recommendations: -Outpatient follow-up to discuss definitive stone management  Thank you for involving me in this patient's care, please page with any further questions or concerns.  Debroah Loop, PA-C 07/25/2019 3:59 PM

## 2019-07-25 NOTE — Plan of Care (Signed)
Patient continues to have pain with inhaling.

## 2019-07-25 NOTE — Progress Notes (Addendum)
PROGRESS NOTE    Katrina Page  [id number] DOB: [birth year] DOA: 07/23/2019 PCP: Patient, No Pcp Per   Brief Narrative:  38 year old with a history of tobacco use, migraines came to the hospital complains of shortness of breath and atypical chest pain.  CT of the chest was consistent with bilateral lower lobe pulmonary embolism without cor pulmonale.  Also found to have multiple nodules including lung and a mass in the left adrenal.  Oncology team was consulted who recommended outpatient follow-up..   Assessment & Plan:   Principal Problem:   Pulmonary embolism (HCC) Active Problems:   Chest pain   Dyspnea   Tobacco use  Atypical chest pain secondary to acute bilateral pulmonary embolism in the lower lobes without cor pulmonale -Ongoing hypercarbia work-up.  Chest pain still persist -Start Xarelto today.  Discussed various anticoagulation options with the patient today. -Supportive care -Echocardiogram-ejection fraction 60 to 65%.  Normal LVH. -Lower extremity Dopplers-negative -Supplemental oxygen as needed.  Supportive measures.  Pain control.  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  Appreciate their input.  Left-sided renal stone with mild hydronephrosis -Obtain renal ultrasound.  IV fluids.  Flomax.  Check UA. Urology, Dr Bernardo Heater to see her today.   Urine pregnancy test positive, beta hCG negative.  Migraines -Pain control.  Daily tobacco use -Nicotine patch if needed  DVT prophylaxis: Xarelto Code Status: Full code Family Communication: None Disposition Plan: Still having quite a bit of chest discomfort especially with minimal ambulation.  Maintain hospital stay until her chest pain is improved  Consultants:   Oncology  Procedures:   None  Antimicrobials:   None   Subjective: Still having quite a bit of chest discomfort with ambulation and with deep breath.  Mild shortness of breath as well with minimal  ambulation.  She is very afraid to go home like this and do not believe she will take care of herself  Review of Systems Otherwise negative except as per HPI, including: General = no fevers, chills, dizziness, malaise, fatigue HEENT/EYES = negative for pain, redness, loss of vision, double vision, blurred vision, loss of hearing, sore throat, hoarseness, dysphagia Cardiovascular= negative for chest pain, palpitation, murmurs, lower extremity swelling Respiratory/lungs= negative for  hemoptysis, wheezing, mucus production Gastrointestinal= negative for nausea, vomiting,, abdominal pain, melena, hematemesis Genitourinary= negative for Dysuria, Hematuria, Change in Urinary Frequency MSK = Negative for arthralgia, myalgias, Back Pain, Joint swelling  Neurology= Negative for headache, seizures, numbness, tingling  Psychiatry= Negative for anxiety, depression, suicidal and homocidal ideation Allergy/Immunology= Medication/Food allergy as listed  Skin= Negative for Rash, lesions, ulcers, itching   Objective: Vitals:   07/24/19 2101 07/24/19 2337 07/25/19 0410 07/25/19 0855  BP: (!) 148/91  135/79   Pulse: 85  62   Resp: 15  15   Temp: 98.7 F (37.1 C)  98 F (36.7 C)   TempSrc: Oral  Oral   SpO2: 100%  100%   Weight:   99.9 kg 100.3 kg  Height: 5\' 9"  (1.753 m) 5\' 9"  (1.753 m)      Intake/Output Summary (Last 24 hours) at 07/25/2019 1011 Last data filed at 07/25/2019 0400 Gross per 24 hour  Intake 1433.38 ml  Output --  Net 1433.38 ml   Filed Weights   07/23/19 1105 07/25/19 0410 07/25/19 0855  Weight: 136.1 kg 99.9 kg 100.3 kg    Examination:  Constitutional: NAD, calm, comfortable Eyes: PERRL, lids and conjunctivae normal ENMT: Mucous membranes are moist. Posterior pharynx clear  of any exudate or lesions.Normal dentition.  Neck: normal, supple, no masses, no thyromegaly Respiratory: clear to auscultation bilaterally, no wheezing, no crackles. Normal respiratory effort. No  accessory muscle use.  Cardiovascular: Regular rate and rhythm, no murmurs / rubs / gallops. No extremity edema. 2+ pedal pulses. No carotid bruits.  Abdomen: no tenderness, no masses palpated. No hepatosplenomegaly. Bowel sounds positive.  Musculoskeletal: no clubbing / cyanosis. No joint deformity upper and lower extremities. Good ROM, no contractures. Normal muscle tone.  Skin: no rashes, lesions, ulcers. No induration Neurologic: CN 2-12 grossly intact. Sensation intact, DTR normal. Strength 5/5 in all 4.  Psychiatric: Normal judgment and insight. Alert and oriented x 3. Normal mood.   Data Reviewed:   CBC: Recent Labs  Lab 07/23/19 1113 07/24/19 0633 07/25/19 0429  WBC 10.1 18.6* 10.2  NEUTROABS 8.2*  --   --   HGB 10.0* 9.2* 8.7*  HCT 33.9* 31.0* 30.2*  MCV 86.7 86.6 88.0  PLT 185 195 [id number]   Basic Metabolic Panel: Recent Labs  Lab 07/23/19 1113 07/24/19 0633 07/25/19 0429  NA 137 140 143  K 2.8* 3.4* 3.0*  CL 94* 101 103  CO2 30 31 30   GLUCOSE 120* 121* 84  BUN 6 10 8   CREATININE 0.82 0.82 0.80  CALCIUM 8.4* 8.2* 8.2*  MG 1.4*  --  1.8   GFR: Estimated Creatinine Clearance: 117.8 mL/min (by C-G formula based on SCr of 0.8 mg/dL). Liver Function Tests: Recent Labs  Lab 07/23/19 1113  AST 16  ALT 9  ALKPHOS 49  BILITOT 0.8  PROT 6.2*  ALBUMIN 3.1*   No results for input(s): LIPASE, AMYLASE in the last 168 hours. No results for input(s): AMMONIA in the last 168 hours. Coagulation Profile: Recent Labs  Lab 07/23/19 1619  INR 1.1   Cardiac Enzymes: No results for input(s): CKTOTAL, CKMB, CKMBINDEX, TROPONINI in the last 168 hours. BNP (last 3 results) No results for input(s): PROBNP in the last 8760 hours. HbA1C: No results for input(s): HGBA1C in the last 72 hours. CBG: No results for input(s): GLUCAP in the last 168 hours. Lipid Profile: No results for input(s): CHOL, HDL, LDLCALC, TRIG, CHOLHDL, LDLDIRECT in the last 72 hours. Thyroid Function  Tests: No results for input(s): TSH, T4TOTAL, FREET4, T3FREE, THYROIDAB in the last 72 hours. Anemia Panel: No results for input(s): VITAMINB12, FOLATE, FERRITIN, TIBC, IRON, RETICCTPCT in the last 72 hours. Sepsis Labs: Recent Labs  Lab 07/23/19 1113  LATICACIDVEN 1.4    Recent Results (from the past 240 hour(s))  SARS CORONAVIRUS 2 (TAT 6-24 HRS) Nasopharyngeal Nasopharyngeal Swab     Status: None   Collection Time: 07/23/19  1:45 PM   Specimen: Nasopharyngeal Swab  Result Value Ref Range Status   SARS Coronavirus 2 NEGATIVE NEGATIVE Final    Comment: (NOTE) SARS-CoV-2 target nucleic acids are NOT DETECTED. The SARS-CoV-2 RNA is generally detectable in upper and lower respiratory specimens during the acute phase of infection. Negative results do not preclude SARS-CoV-2 infection, do not rule out co-infections with other pathogens, and should not be used as the sole basis for treatment or other patient management decisions. Negative results must be combined with clinical observations, patient history, and epidemiological information. The expected result is Negative. Fact Sheet for Patients: SugarRoll.be Fact Sheet for Healthcare Providers: https://www.woods-mathews.com/ This test is not yet approved or cleared by the [place] FDA and  has been authorized for detection and/or diagnosis of SARS-CoV-2 by FDA under an Emergency Use Authorization (  EUA). This EUA will remain  in effect (meaning this test can be used) for the duration of the COVID-19 declaration under Section 56 4(b)(1) of the Act, 21 U.S.C. section 360bbb-3(b)(1), unless the authorization is terminated or revoked sooner. Performed at [facility], [place] [address], [place], [place] [address]          Radiology Studies: Dg Chest 2 View  Result Date: 07/23/2019 CLINICAL DATA:  Fever and shortness of breath. Right back and chest pain with inspiration. EXAM:  CHEST - 2 VIEW COMPARISON:  12/01/2018 FINDINGS: Linear densities in the mid right lung. Patchy densities in the right lower chest. Right chest densities are predominantly in the anterior aspect of the right chest. No large pleural effusions. Left lung is clear. Heart and mediastinum are within normal limits. Trachea is midline. Negative for a pneumothorax. Bone structures are unremarkable. IMPRESSION: Patchy and linear densities in the right chest. Some of the densities may represent postinflammatory changes from previous pneumonia but there is concern for acute infectious or inflammatory process. Electronically Signed   By: Markus Daft M.D.   On: 07/23/2019 12:16   Ct Angio Chest Pe W And/or Wo Contrast  Result Date: 07/23/2019 CLINICAL DATA:  Shortness of breath. EXAM: CT ANGIOGRAPHY CHEST WITH CONTRAST TECHNIQUE: Multidetector CT imaging of the chest was performed using the standard protocol during bolus administration of intravenous contrast. Multiplanar CT image reconstructions and MIPs were obtained to evaluate the vascular anatomy. CONTRAST:  97mL OMNIPAQUE IOHEXOL 350 MG/ML SOLN, 75mL OMNIPAQUE IOHEXOL 350 MG/ML SOLN COMPARISON:  November 29, 2018 FINDINGS: Cardiovascular: The thoracic aorta is nonaneurysmal without dissection or atherosclerotic change. The heart is unremarkable. Evaluation of the pulmonary arteries is somewhat limited due to timing of contrast. However, pulmonary emboli are identified. An embolus is seen within a segmental branch of the left lower lobe on axial image 125. A pulmonary embolus is seen within the segmental branch of the right lower lobe as well on axial image 145. No upper lobe emboli identified. No central emboli identified. Mediastinum/Nodes: Thyroid and esophagus are normal. Mildly prominent node in the right hilum is more prominent in the interval measuring 1.5 cm, favored to be reactive. No other adenopathy. There is a small right pleural effusion which is larger in the  interval. No pericardial effusion. Lungs/Pleura: Central airways are normal. No pneumothorax. Patchy opacity is seen in the right upper and lower lobes. Much of the opacity is platelike suggesting at least some scar atelectasis. However, superimposed patchy pneumonia not excluded. No infiltrate seen in the left lung. A rounded nodular density is seen in the anterior right lung on axial image 59 measuring 1.8 cm. No other nodules or masses. Upper Abdomen: Renal stones are seen bilaterally. There is a stone in the left renal pelvis measuring up to 1.3 cm with mild hydronephrosis on the left and stranding adjacent to the renal pelvis. This is similar in appearance when compared to the November 27, 2007 study. Multiple other stones are seen in the left kidney. There is a mass abutting the left adrenal gland measuring 5.6 cm. This mass is new when compared to a CT of the abdomen and pelvis from Jan 27, 2008. The patient is status post cholecystectomy. Musculoskeletal: No chest wall abnormality. No acute or significant osseous findings. Review of the MIP images confirms the above findings. IMPRESSION: 1. The study is positive for pulmonary emboli in the bilateral lower lobes with no evidence of strain. 2. There is a nodule along  the medial right anterior lung on series 4, image 60 measuring 1.8 cm. This was not present in March of 2020. This could represent focal rounded atelectasis given the short interval follow-up but neoplasm/malignancy is not excluded on this study. Given the new mass thought to arise from the left renal gland described below, recommend a PET-CT for further evaluation of this nodule. 3. 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 The mass is nonspecific but malignancy/neoplasm not excluded. Given the new nodular density in the anterior right lung, recommend a PET-CT for further evaluation. 4. Prominent node in the right hilum. This  may be reactive. Recommend attention on the recommended PET-CT. 5. Patchy opacities in the lungs. Some of the opacities are clearly atelectasis and/or scar. A superimposed multifocal infectious process is not excluded. 6. There is a stone in the left renal pelvis measuring up to 1.3 cm. Mild left hydronephrosis and fat stranding adjacent to the left renal pelvis suggests an acute component. Finding is similar when compared to 2009 however. 7. Multiple other stones in both kidneys. Aortic Atherosclerosis (ICD10-I70.0). Electronically Signed   By: Dorise Bullion III M.D   On: 07/23/2019 15:25   US Venous Img Lower Bilateral  Result Date: 07/24/2019 CLINICAL DATA:  Pulmonary emboli. EXAM: BILATERAL LOWER EXTREMITY VENOUS DOPPLER ULTRASOUND TECHNIQUE: Gray-scale sonography with graded compression, as well as color Doppler and duplex ultrasound were performed to evaluate the lower extremity deep venous systems from the level of the common femoral vein and including the common femoral, femoral, profunda femoral, popliteal and calf veins including the posterior tibial, peroneal and gastrocnemius veins when visible. The superficial great saphenous vein was also interrogated. Spectral Doppler was utilized to evaluate flow at rest and with distal augmentation maneuvers in the common femoral, femoral and popliteal veins. COMPARISON:  None. FINDINGS: RIGHT LOWER EXTREMITY Common Femoral Vein: No evidence of thrombus. Normal compressibility, respiratory phasicity and response to augmentation. Saphenofemoral Junction: No evidence of thrombus. Normal compressibility and flow on color Doppler imaging. Profunda Femoral Vein: No evidence of thrombus. Normal compressibility and flow on color Doppler imaging. Femoral Vein: No evidence of thrombus. Normal compressibility, respiratory phasicity and response to augmentation. Popliteal Vein: No evidence of thrombus. Normal compressibility, respiratory phasicity and response to  augmentation. Calf Veins: No evidence of thrombus. Normal compressibility and flow on color Doppler imaging. Superficial Great Saphenous Vein: No evidence of thrombus. Normal compressibility. Venous Reflux:  None. Other Findings:  None. LEFT LOWER EXTREMITY Common Femoral Vein: No evidence of thrombus. Normal compressibility, respiratory phasicity and response to augmentation. Saphenofemoral Junction: No evidence of thrombus. Normal compressibility and flow on color Doppler imaging. Profunda Femoral Vein: No evidence of thrombus. Normal compressibility and flow on color Doppler imaging. Femoral Vein: No evidence of thrombus. Normal compressibility, respiratory phasicity and response to augmentation. Popliteal Vein: No evidence of thrombus. Normal compressibility, respiratory phasicity and response to augmentation. Calf Veins: No evidence of thrombus. Normal compressibility and flow on color Doppler imaging. Superficial Great Saphenous Vein: No evidence of thrombus. Normal compressibility. Venous Reflux:  None. Other Findings:  None. IMPRESSION: No evidence of deep venous thrombosis in either lower extremity. Electronically Signed   By: Marijo Sanes M.D.   On: 07/24/2019 12:46        Scheduled Meds:  ketorolac  30 mg Intravenous Once   nicotine  21 mg Transdermal Daily   rivaroxaban  15 mg Oral BID WC  Followed by   Derrill Memo ON 08/15/2019] rivaroxaban  20 mg Oral Q supper   tamsulosin  0.4 mg Oral QPC breakfast   Continuous Infusions:  sodium chloride 75 mL/hr at 07/25/19 0400     LOS: 2 days   Time spent= 25 mins    Taysen Bushart Arsenio Loader, MD [facility]  If 7PM-7AM, please contact night-coverage  07/25/2019, 10:11 AM

## 2019-07-26 LAB — PROTEIN C, TOTAL: Protein C, Total: 69 % (ref 60–150)

## 2019-07-26 LAB — CBC
HCT: 31.6 % — ABNORMAL LOW (ref 36.0–46.0)
Hemoglobin: 9.2 g/dL — ABNORMAL LOW (ref 12.0–15.0)
MCH: 25.6 pg — ABNORMAL LOW (ref 26.0–34.0)
MCHC: 29.1 g/dL — ABNORMAL LOW (ref 30.0–36.0)
MCV: 88 fL (ref 80.0–100.0)
Platelets: 244 10*3/uL (ref 150–400)
RBC: 3.59 MIL/uL — ABNORMAL LOW (ref 3.87–5.11)
RDW: 21.8 % — ABNORMAL HIGH (ref 11.5–15.5)
WBC: 8.2 10*3/uL (ref 4.0–10.5)
nRBC: 0 % (ref 0.0–0.2)

## 2019-07-26 LAB — BASIC METABOLIC PANEL
Anion gap: 8 (ref 5–15)
BUN: 6 mg/dL (ref 6–20)
CO2: 29 mmol/L (ref 22–32)
Calcium: 8.5 mg/dL — ABNORMAL LOW (ref 8.9–10.3)
Chloride: 105 mmol/L (ref 98–111)
Creatinine, Ser: 0.76 mg/dL (ref 0.44–1.00)
GFR calc Af Amer: >60 mL/min (ref >60)
GFR calc non Af Amer: >60 mL/min (ref >60)
Glucose, Bld: 86 mg/dL (ref 70–99)
Potassium: 3.3 mmol/L — ABNORMAL LOW (ref 3.5–5.1)
Sodium: 142 mmol/L (ref 135–145)

## 2019-07-26 LAB — MAGNESIUM: Magnesium: 1.7 mg/dL (ref 1.7–2.4)

## 2019-07-26 MED ORDER — POTASSIUM CHLORIDE CRYS ER 20 MEQ PO TBCR
40.0000 meq | EXTENDED_RELEASE_TABLET | Freq: Once | ORAL | Status: AC
  Administered 2019-07-26: 40 meq via ORAL
  Filled 2019-07-26: qty 2

## 2019-07-26 MED ORDER — OXYCODONE HCL 10 MG PO TABS: 10.0000 mg | ORAL_TABLET | Freq: Four times a day (QID) | ORAL | 0 refills | Status: DC | PRN

## 2019-07-26 MED ORDER — SENNOSIDES-DOCUSATE SODIUM 8.6-50 MG PO TABS: 2.0000 | ORAL_TABLET | Freq: Every evening | ORAL | 0 refills | Status: AC | PRN

## 2019-07-26 MED ORDER — TAMSULOSIN HCL 0.4 MG PO CAPS: 0.4000 mg | ORAL_CAPSULE | Freq: Every day | ORAL | 0 refills | Status: DC

## 2019-07-26 MED ORDER — RIVAROXABAN (XARELTO) VTE STARTER PACK (15 & 20 MG): ORAL_TABLET | ORAL | 0 refills | Status: DC

## 2019-07-26 NOTE — TOC Initial Note (Signed)
Transition of Care [facility]) - Initial/Assessment Note    Patient Details  Name: Katrina Page MRN: [id number] Date of Birth: [birth year]  Transition of [facility]) CM/SW Contact:    Shelbie Hutching, RN Phone Number: 07/26/2019, 9:04 AM  Clinical Narrative:                 Patient admitted with pulmonary embolus, current smoker.  Patient is from home where she lives with her children and her mother.  Patient reports that her mother is in poor health and moved in with her and her children so they could help care for her.  Patient works at a [employer] and she has reliable transportation.  Patient does not have insurance and no PCP.  RNCM provided patient with application for [facility] and [facility].  Patient will be on new Xarelto, discharge prescriptions faxed over to [facility] for patient to pick up at discharge.  RNCM also made referral to [facility], patient needs to follow up with a PCP in 1 week if possible.    Expected Discharge Plan: Home/Self Care Barriers to Discharge: No Barriers Identified   Patient Goals and CMS Choice        Expected Discharge Plan and Services Expected Discharge Plan: Home/Self Care   Discharge Planning Services: CM Consult, Medication Assistance, [facility]   Living arrangements for the past 2 months: Mobile Home Expected Discharge Date: 07/26/19                                    Prior Living Arrangements/Services Living arrangements for the past 2 months: Mobile Home Lives with:: Minor Children, Adult Children, Parents Patient language and need for interpreter reviewed:: No Do you feel safe going back to the place where you live?: Yes      Need for Family Participation in Patient Care: Yes (Comment)(PE and Kidney stones) Care giver support system in place?: Yes (comment)(children and mother)   Criminal Activity/Legal Involvement Pertinent to Current Situation/Hospitalization: No - Comment as  needed  Activities of Daily Living Home Assistive Devices/Equipment: None ADL Screening (condition at time of admission) Patient's cognitive ability adequate to safely complete daily activities?: Yes Is the patient deaf or have difficulty hearing?: No Does the patient have difficulty seeing, even when wearing glasses/contacts?: No Does the patient have difficulty concentrating, remembering, or making decisions?: No Patient able to express need for assistance with ADLs?: Yes Does the patient have difficulty dressing or bathing?: No Independently performs ADLs?: Yes (appropriate for developmental age) Does the patient have difficulty walking or climbing stairs?: No Weakness of Legs: None Weakness of Arms/Hands: None  Permission Sought/Granted Permission sought to share information with : Case Manager Permission granted to share information with : Yes, Verbal Permission Granted     Permission granted to share info w AGENCY: [facility] and [facility]        Emotional Assessment Appearance:: Appears stated age Attitude/Demeanor/Rapport: Engaged Affect (typically observed): Accepting Orientation: : Oriented to Self, Oriented to Place, Oriented to  Time, Oriented to Situation Alcohol / Substance Use: Tobacco Use Psych Involvement: No (comment)  Admission diagnosis:  Cancer ([place]) [C80.1] Pulmonary embolism on left [facility]) [I26.99] Pulmonary embolism on right ([place]) [I26.99] Pulmonary emboli ([place]) [I26.99] Pneumonia of right upper lobe due to infectious organism [J18.9] Patient Active Problem List   Diagnosis Date Noted  . Chest pain 07/24/2019  .  Dyspnea 07/24/2019  . Tobacco use 07/24/2019  . Pulmonary embolism ([place]) 07/23/2019  . Sepsis ([place]) 11/29/2018   PCP:  Patient, No Pcp Per Pharmacy:   [facility] [place] [address] Phone: [phone] Fax: [phone]     Social Determinants of Health (SDOH)  Interventions    Readmission Risk Interventions No flowsheet data found.

## 2019-07-26 NOTE — Discharge Summary (Signed)
Physician Discharge Summary  EVERLY BURDICK [id number] DOB: [birth year] DOA: 07/23/2019  PCP: Patient, No Pcp Per  Admit date: 07/23/2019 Discharge date: 07/26/2019  Admitted From: Home Disposition: Home  Recommendations for Outpatient Follow-up:  1. Follow up with PCP in 1-2 weeks 2. Please obtain BMP/CBC in one week your next doctors visit.  3. Anticoagulation for at least 6 months to 1 year.  Can be determined by her outpatient primary care physician 4. Outpatient follow-up with urology for renal stone.  Recommend minimize interruption in anticoagulation 5. Counseled to quit drinking alcohol and smoking. 6. Flomax prescribed.  Advised to hydrate herself orally 7. Oxycodone with bowel regimen prescribed 8. Advised to take off from work for at least 1 week and then when she resumes it, advised her to take it easy. 9. Pulmonary nodule and hypercoagulable work-up to be followed up outpatient with oncology, Dr. Grayland Ormond   Discharge Condition: Stable CODE STATUS: Full code Diet recommendation: 2 g salt  Brief/Interim Summary: 38 year old with a history of tobacco use, migraines came to the hospital complains of shortness of breath and atypical chest pain.  CT of the chest was consistent with bilateral lower lobe pulmonary embolism without cor pulmonale.  Also found to have multiple nodules including lung and a mass in the left adrenal.  Oncology team was consulted who recommended outpatient follow-up.  Hypercoagulable work-up was ongoing which is planned to be followed up outpatient.  On the CT scan she was also noted to have obstructive nephropathy with hydronephrosis therefore urology team was consulted.  She was hydrated and Flomax was started.  She tolerated this well. Had extensive discussion with her regarding options for anticoagulation and she chose to go home on Xarelto.  1 month free drug card was given.  Breast arrangements were made by case management. She is stable to be  discharged with outpatient follow-up recommendations as stated above.   Discharge Diagnoses:  Principal Problem:   Pulmonary embolism (HCC) Active Problems:   Chest pain   Dyspnea   Tobacco use  Atypical chest pain secondary to acute bilateral pulmonary embolism in the lower lobes without cor pulmonale -Extensive discussion with the patient-she chose to go home on Xarelto.  Arrangements were made by the help of case management to at least give her first month free supply.  Advised her to remain on anticoagulation for at least 6 months to 1 year, determination can be made by her primary care physician.  Would minimize interruption in any anticoagulation in the meantime. -Echocardiogram-ejection fraction 60 to 65%.  Normal LVH. -Lower extremity Dopplers-negative -Oxycodone with bowel regimen has been given for pain control. -Part of the hypercoagulable work-up is pending which can be followed up outpatient.  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  Appreciate their input.  Left-sided renal stone with mild hydronephrosis -Renal ultrasound is negative.  Continue Flomax and oral hydration.  Appreciate urology input.  Follow-up outpatient, arrangements to be made by their service.  Urine pregnancy test positive, quantitative beta hCG negative.  Migraines -Pain control.  Daily tobacco use Alcohol use -Counseled to quit smoking cigarettes and alcohol use   Consultations:  Urology  Oncology   Subjective: Reports of occasional chest discomfort especially with deep breaths but ambulating well without any issues.  Remains 100% on room air with ambulation.  Continues to tell me that she might smoke a cigarette and drink alcohol when she goes home despite of me advising against it.  Discharge Exam: Vitals:  07/25/19 1949 07/26/19 0501  BP: (!) 143/91 (!) 141/87  Pulse: 78 71  Resp: 16 16  Temp: 98.9 F (37.2 C) 98.2 F (36.8 C)   SpO2: 100% 99%   Vitals:   07/25/19 0855 07/25/19 1525 07/25/19 1949 07/26/19 0501  BP:  (!) 154/99 (!) 143/91 (!) 141/87  Pulse:  76 78 71  Resp:   16 16  Temp:  98.4 F (36.9 C) 98.9 F (37.2 C) 98.2 F (36.8 C)  TempSrc:  Oral Oral Oral  SpO2:  98% 100% 99%  Weight: 100.3 kg     Height:        General: Pt is alert, awake, not in acute distress Cardiovascular: RRR, S1/S2 +, no rubs, no gallops Respiratory: CTA bilaterally, no wheezing, no rhonchi Abdominal: Soft, NT, ND, bowel sounds + Extremities: no edema, no cyanosis  Discharge Instructions  Discharge Instructions    Discharge patient   Complete by: As directed    SW arranage for free month supply of Xarelto.   Discharge disposition: 01-Home or Self Care   Discharge patient date: 07/26/2019     Allergies as of 07/26/2019   No Known Allergies     Medication List    TAKE these medications   acetaminophen 325 MG tablet Commonly known as: TYLENOL Take 650 mg by mouth every 6 (six) hours as needed for mild pain or fever.   albuterol 108 (90 Base) MCG/ACT inhaler Commonly known as: VENTOLIN HFA Inhale 2 puffs into the lungs every 4 (four) hours as needed for wheezing or shortness of breath.   dextromethorphan-guaiFENesin 10-100 MG/5ML liquid Commonly known as: Tussin DM Take 5 mLs by mouth every 6 (six) hours as needed for cough.   nabumetone 750 MG tablet Commonly known as: RELAFEN Take 1 tablet (750 mg total) by mouth 2 (two) times daily.   Oxycodone HCl 10 MG Tabs Take 1 tablet (10 mg total) by mouth every 6 (six) hours as needed for moderate pain or severe pain.   Rivaroxaban 15 & 20 MG Tbpk Follow package directions: Take one 15mg  tablet by mouth twice a day. On day 22, switch to one 20mg  tablet once a day. Take with food.   senna-docusate 8.6-50 MG tablet Commonly known as: Senokot-S Take 2 tablets by mouth at bedtime as needed for mild constipation or moderate constipation.   tamsulosin 0.4 MG  Caps capsule Commonly known as: FLOMAX Take 1 capsule (0.4 mg total) by mouth daily after breakfast.      Follow-up Information    [facility] Go in 1 week.   Specialty: Primary Care Contact information: [place] [phone]         No Known Allergies  You were cared for by a hospitalist during your hospital stay. If you have any questions about your discharge medications or the care you received while you were in the hospital after you are discharged, you can call the unit and asked to speak with the hospitalist on call if the hospitalist that took care of you is not available. Once you are discharged, your primary care physician will handle any further medical issues. Please note that no refills for any discharge medications will be authorized once you are discharged, as it is imperative that you return to your primary care physician (or establish a relationship with a primary care physician if you do not have one) for your aftercare needs so that they can  reassess your need for medications and monitor your lab values.   Procedures/Studies: Dg Chest 2 View  Result Date: 07/23/2019 CLINICAL DATA:  Fever and shortness of breath. Right back and chest pain with inspiration. EXAM: CHEST - 2 VIEW COMPARISON:  12/01/2018 FINDINGS: Linear densities in the mid right lung. Patchy densities in the right lower chest. Right chest densities are predominantly in the anterior aspect of the right chest. No large pleural effusions. Left lung is clear. Heart and mediastinum are within normal limits. Trachea is midline. Negative for a pneumothorax. Bone structures are unremarkable. IMPRESSION: Patchy and linear densities in the right chest. Some of the densities may represent postinflammatory changes from previous pneumonia but there is concern for acute infectious or inflammatory process. Electronically Signed   By: Markus Daft  M.D.   On: 07/23/2019 12:16   Ct Angio Chest Pe W And/or Wo Contrast  Result Date: 07/23/2019 CLINICAL DATA:  Shortness of breath. EXAM: CT ANGIOGRAPHY CHEST WITH CONTRAST TECHNIQUE: Multidetector CT imaging of the chest was performed using the standard protocol during bolus administration of intravenous contrast. Multiplanar CT image reconstructions and MIPs were obtained to evaluate the vascular anatomy. CONTRAST:  63mL OMNIPAQUE IOHEXOL 350 MG/ML SOLN, 58mL OMNIPAQUE IOHEXOL 350 MG/ML SOLN COMPARISON:  November 29, 2018 FINDINGS: Cardiovascular: The thoracic aorta is nonaneurysmal without dissection or atherosclerotic change. The heart is unremarkable. Evaluation of the pulmonary arteries is somewhat limited due to timing of contrast. However, pulmonary emboli are identified. An embolus is seen within a segmental branch of the left lower lobe on axial image 125. A pulmonary embolus is seen within the segmental branch of the right lower lobe as well on axial image 145. No upper lobe emboli identified. No central emboli identified. Mediastinum/Nodes: Thyroid and esophagus are normal. Mildly prominent node in the right hilum is more prominent in the interval measuring 1.5 cm, favored to be reactive. No other adenopathy. There is a small right pleural effusion which is larger in the interval. No pericardial effusion. Lungs/Pleura: Central airways are normal. No pneumothorax. Patchy opacity is seen in the right upper and lower lobes. Much of the opacity is platelike suggesting at least some scar atelectasis. However, superimposed patchy pneumonia not excluded. No infiltrate seen in the left lung. A rounded nodular density is seen in the anterior right lung on axial image 59 measuring 1.8 cm. No other nodules or masses. Upper Abdomen: Renal stones are seen bilaterally. There is a stone in the left renal pelvis measuring up to 1.3 cm with mild hydronephrosis on the left and stranding adjacent to the renal pelvis. This  is similar in appearance when compared to the November 27, 2007 study. Multiple other stones are seen in the left kidney. There is a mass abutting the left adrenal gland measuring 5.6 cm. This mass is new when compared to a CT of the abdomen and pelvis from Jan 27, 2008. The patient is status post cholecystectomy. Musculoskeletal: No chest wall abnormality. No acute or significant osseous findings. Review of the MIP images confirms the above findings. IMPRESSION: 1. The study is positive for pulmonary emboli in the bilateral lower lobes with no evidence of strain. 2. There is a nodule along the medial right anterior lung on series 4, image 60 measuring 1.8 cm. This was not present in March of 2020. This could represent focal rounded atelectasis given the short interval follow-up but neoplasm/malignancy is not excluded on this study. Given the new mass thought to arise from the left renal  gland described below, recommend a PET-CT for further evaluation of this nodule. 3. 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 The mass is nonspecific but malignancy/neoplasm not excluded. Given the new nodular density in the anterior right lung, recommend a PET-CT for further evaluation. 4. Prominent node in the right hilum. This may be reactive. Recommend attention on the recommended PET-CT. 5. Patchy opacities in the lungs. Some of the opacities are clearly atelectasis and/or scar. A superimposed multifocal infectious process is not excluded. 6. There is a stone in the left renal pelvis measuring up to 1.3 cm. Mild left hydronephrosis and fat stranding adjacent to the left renal pelvis suggests an acute component. Finding is similar when compared to 2009 however. 7. Multiple other stones in both kidneys. Aortic Atherosclerosis (ICD10-I70.0). Electronically Signed   By: Dorise Bullion III M.D   On: 07/23/2019 15:25   US Renal  Result Date: 07/25/2019 CLINICAL  DATA:  Obstructive nephropathy EXAM: RENAL / URINARY TRACT ULTRASOUND COMPLETE COMPARISON:  None. FINDINGS: Right Kidney: Renal measurements: 11.6 x 4.4 x 4.7 cm = volume: 125.8 mL. Normal renal cortical thickness and echogenicity. No hydronephrosis. There is a simple appearing 2.0 x 1.7 x 1.7 cm lower pole cyst. Left Kidney: Renal measurements: 10.3 x 4.9 x 4.7 cm = volume: 124.2 mL. Normal renal cortical thickness and echogenicity without focal lesions or hydronephrosis. Bladder: Appears normal for degree of bladder distention. Other: None. IMPRESSION: Unremarkable renal ultrasound examination. No worrisome renal lesions or hydronephrosis. Simple appearing lower pole right renal cyst. Electronically Signed   By: Marijo Sanes M.D.   On: 07/25/2019 11:54   US Venous Img Lower Bilateral  Result Date: 07/24/2019 CLINICAL DATA:  Pulmonary emboli. EXAM: BILATERAL LOWER EXTREMITY VENOUS DOPPLER ULTRASOUND TECHNIQUE: Gray-scale sonography with graded compression, as well as color Doppler and duplex ultrasound were performed to evaluate the lower extremity deep venous systems from the level of the common femoral vein and including the common femoral, femoral, profunda femoral, popliteal and calf veins including the posterior tibial, peroneal and gastrocnemius veins when visible. The superficial great saphenous vein was also interrogated. Spectral Doppler was utilized to evaluate flow at rest and with distal augmentation maneuvers in the common femoral, femoral and popliteal veins. COMPARISON:  None. FINDINGS: RIGHT LOWER EXTREMITY Common Femoral Vein: No evidence of thrombus. Normal compressibility, respiratory phasicity and response to augmentation. Saphenofemoral Junction: No evidence of thrombus. Normal compressibility and flow on color Doppler imaging. Profunda Femoral Vein: No evidence of thrombus. Normal compressibility and flow on color Doppler imaging. Femoral Vein: No evidence of thrombus. Normal  compressibility, respiratory phasicity and response to augmentation. Popliteal Vein: No evidence of thrombus. Normal compressibility, respiratory phasicity and response to augmentation. Calf Veins: No evidence of thrombus. Normal compressibility and flow on color Doppler imaging. Superficial Great Saphenous Vein: No evidence of thrombus. Normal compressibility. Venous Reflux:  None. Other Findings:  None. LEFT LOWER EXTREMITY Common Femoral Vein: No evidence of thrombus. Normal compressibility, respiratory phasicity and response to augmentation. Saphenofemoral Junction: No evidence of thrombus. Normal compressibility and flow on color Doppler imaging. Profunda Femoral Vein: No evidence of thrombus. Normal compressibility and flow on color Doppler imaging. Femoral Vein: No evidence of thrombus. Normal compressibility, respiratory phasicity and response to augmentation. Popliteal Vein: No evidence of thrombus. Normal compressibility, respiratory phasicity and response to augmentation. Calf Veins: No evidence of thrombus. Normal compressibility and flow on color Doppler imaging.  Superficial Great Saphenous Vein: No evidence of thrombus. Normal compressibility. Venous Reflux:  None. Other Findings:  None. IMPRESSION: No evidence of deep venous thrombosis in either lower extremity. Electronically Signed   By: Marijo Sanes M.D.   On: 07/24/2019 12:46      The results of significant diagnostics from this hospitalization (including imaging, microbiology, ancillary and laboratory) are listed below for reference.     Microbiology: Recent Results (from the past 240 hour(s))  SARS CORONAVIRUS 2 (TAT 6-24 HRS) Nasopharyngeal Nasopharyngeal Swab     Status: None   Collection Time: 07/23/19  1:45 PM   Specimen: Nasopharyngeal Swab  Result Value Ref Range Status   SARS Coronavirus 2 NEGATIVE NEGATIVE Final    Comment: (NOTE) SARS-CoV-2 target nucleic acids are NOT DETECTED. The SARS-CoV-2 RNA is generally  detectable in upper and lower respiratory specimens during the acute phase of infection. Negative results do not preclude SARS-CoV-2 infection, do not rule out co-infections with other pathogens, and should not be used as the sole basis for treatment or other patient management decisions. Negative results must be combined with clinical observations, patient history, and epidemiological information. The expected result is Negative. Fact Sheet for Patients: SugarRoll.be Fact Sheet for Healthcare Providers: https://www.woods-mathews.com/ This test is not yet approved or cleared by the [place] FDA and  has been authorized for detection and/or diagnosis of SARS-CoV-2 by FDA under an Emergency Use Authorization (EUA). This EUA will remain  in effect (meaning this test can be used) for the duration of the COVID-19 declaration under Section 56 4(b)(1) of the Act, 21 U.S.C. section 360bbb-3(b)(1), unless the authorization is terminated or revoked sooner. Performed at [facility], [place] [address], [place],  [address]      Labs: BNP (last 3 results) No results for input(s): BNP in the last 8760 hours. Basic Metabolic Panel: Recent Labs  Lab 07/23/19 1113 07/24/19 0633 07/25/19 0429 07/26/19 0503  NA 137 140 143 142  K 2.8* 3.4* 3.0* 3.3*  CL 94* 101 103 105  CO2 30 31 30 29   GLUCOSE 120* 121* 84 86  BUN 6 10 8 6   CREATININE 0.82 0.82 0.80 0.76  CALCIUM 8.4* 8.2* 8.2* 8.5*  MG 1.4*  --  1.8 1.7   Liver Function Tests: Recent Labs  Lab 07/23/19 1113  AST 16  ALT 9  ALKPHOS 49  BILITOT 0.8  PROT 6.2*  ALBUMIN 3.1*   No results for input(s): LIPASE, AMYLASE in the last 168 hours. No results for input(s): AMMONIA in the last 168 hours. CBC: Recent Labs  Lab 07/23/19 1113 07/24/19 0633 07/25/19 0429 07/26/19 0503  WBC 10.1 18.6* 10.2 8.2  NEUTROABS 8.2*  --   --   --   HGB 10.0* 9.2* 8.7* 9.2*  HCT 33.9*  31.0* 30.2* 31.6*  MCV 86.7 86.6 88.0 88.0  PLT 185 195 206 244   Cardiac Enzymes: No results for input(s): CKTOTAL, CKMB, CKMBINDEX, TROPONINI in the last 168 hours. BNP: Invalid input(s): POCBNP CBG: No results for input(s): GLUCAP in the last 168 hours. D-Dimer No results for input(s): DDIMER in the last 72 hours. Hgb A1c No results for input(s): HGBA1C in the last 72 hours. Lipid Profile No results for input(s): CHOL, HDL, LDLCALC, TRIG, CHOLHDL, LDLDIRECT in the last 72 hours. Thyroid function studies No results for input(s): TSH, T4TOTAL, T3FREE, THYROIDAB in the last 72 hours.  Invalid input(s): FREET3 Anemia work up No results for input(s): VITAMINB12, FOLATE, FERRITIN, TIBC, IRON,  RETICCTPCT in the last 72 hours. Urinalysis    Component Value Date/Time   COLORURINE YELLOW (A) 07/25/2019 1020   APPEARANCEUR CLEAR (A) 07/25/2019 1020   APPEARANCEUR Hazy 01/03/2013 1856   LABSPEC 1.010 07/25/2019 1020   LABSPEC 1.024 01/03/2013 1856   PHURINE 7.0 07/25/2019 1020   GLUCOSEU NEGATIVE 07/25/2019 1020   GLUCOSEU Negative 01/03/2013 1856   HGBUR NEGATIVE 07/25/2019 1020   BILIRUBINUR NEGATIVE 07/25/2019 1020   BILIRUBINUR Negative 01/03/2013 1856   KETONESUR NEGATIVE 07/25/2019 1020   PROTEINUR NEGATIVE 07/25/2019 1020   NITRITE NEGATIVE 07/25/2019 1020   LEUKOCYTESUR MODERATE (A) 07/25/2019 1020   LEUKOCYTESUR 1+ 01/03/2013 1856   Sepsis Labs Invalid input(s): PROCALCITONIN,  WBC,  LACTICIDVEN Microbiology Recent Results (from the past 240 hour(s))  SARS CORONAVIRUS 2 (TAT 6-24 HRS) Nasopharyngeal Nasopharyngeal Swab     Status: None   Collection Time: 07/23/19  1:45 PM   Specimen: Nasopharyngeal Swab  Result Value Ref Range Status   SARS Coronavirus 2 NEGATIVE NEGATIVE Final    Comment: (NOTE) SARS-CoV-2 target nucleic acids are NOT DETECTED. The SARS-CoV-2 RNA is generally detectable in upper and lower respiratory specimens during the acute phase of  infection. Negative results do not preclude SARS-CoV-2 infection, do not rule out co-infections with other pathogens, and should not be used as the sole basis for treatment or other patient management decisions. Negative results must be combined with clinical observations, patient history, and epidemiological information. The expected result is Negative. Fact Sheet for Patients: SugarRoll.be Fact Sheet for Healthcare Providers: https://www.woods-mathews.com/ This test is not yet approved or cleared by the [place] FDA and  has been authorized for detection and/or diagnosis of SARS-CoV-2 by FDA under an Emergency Use Authorization (EUA). This EUA will remain  in effect (meaning this test can be used) for the duration of the COVID-19 declaration under Section 56 4(b)(1) of the Act, 21 U.S.C. section 360bbb-3(b)(1), unless the authorization is terminated or revoked sooner. Performed at [facility], [place] [address] [place], [place] [address]      Time coordinating discharge:  I have spent 35 minutes face to face with the patient and on the ward discussing the patients care, assessment, plan and disposition with other care givers. >50% of the time was devoted counseling the patient about the risks and benefits of treatment/Discharge disposition and coordinating care.   SIGNED:   Damita Lack, MD  [facility] 07/26/2019, 10:12 AM   If 7PM-7AM, please contact night-coverage

## 2019-07-27 LAB — PROTEIN S ACTIVITY: Protein S Activity: 104 % (ref 63–140)

## 2019-07-27 LAB — PROTEIN S, TOTAL: Protein S Ag, Total: 89 % (ref 60–150)

## 2019-07-27 LAB — PROTEIN C ACTIVITY: Protein C Activity: 74 % (ref 73–180)

## 2019-07-27 LAB — CARDIOLIPIN ANTIBODIES, IGG, IGM, IGA
Anticardiolipin IgA: <9 APL U/mL (ref 0–11)
Anticardiolipin IgG: <9 GPL U/mL (ref 0–14)
Anticardiolipin IgM: 10 MPL U/mL (ref 0–12)

## 2019-07-27 LAB — LUPUS ANTICOAGULANT PANEL
DRVVT: 42.7 s (ref 0.0–47.0)
PTT Lupus Anticoagulant: 48.4 s (ref 0.0–51.9)

## 2019-07-28 LAB — FACTOR 5 LEIDEN

## 2019-07-29 LAB — PROTHROMBIN GENE MUTATION

## 2019-08-06 NOTE — Progress Notes (Signed)
[place]  [phone]) [phone] Fax:(336) [phone]  ID: Katrina Page OB: [birth year]  MR#: [id number]  [id number]  Patient Care Team: Patient, No Pcp Per as PCP - General (General Practice)  CHIEF COMPLAINT: Bilateral pulmonary embolism, lung nodule.  INTERVAL HISTORY: Patient returns to clinic today for hospital follow-up and additional diagnostic planning.  She was recently diagnosed with bilateral pulmonary embolism.  She also noted to have suspicious pulmonary nodule.  She feels improved since her discharge and does not complain of pleuritic chest pain or shortness of breath today.  She continues to states she "always feels bad". She has no neurologic complaints.  She denies any recent fevers or illnesses.  She has a good appetite and denies weight loss.  She denies any cough or hemoptysis.  She has no nausea, vomiting, constipation, or diarrhea.  She has no urinary complaints.    Patient offers no further specific complaints today.  REVIEW OF SYSTEMS:   Review of Systems  Constitutional: Positive for malaise/fatigue. Negative for fever and weight loss.  Respiratory: Negative.  Negative for cough, hemoptysis and shortness of breath.   Cardiovascular: Negative.  Negative for chest pain and leg swelling.  Gastrointestinal: Negative.  Negative for abdominal pain.  Genitourinary: Negative.  Negative for dysuria and hematuria.  Musculoskeletal: Negative.  Negative for back pain.  Skin: Negative.  Negative for rash.  Neurological: Positive for weakness. Negative for dizziness, focal weakness and headaches.  Psychiatric/Behavioral: Negative.  The patient is not nervous/anxious.     As per HPI. Otherwise, a complete review of systems is negative.  PAST MEDICAL HISTORY: Past Medical History:  Diagnosis Date   Migraines     PAST SURGICAL HISTORY: Past Surgical History:  Procedure Laterality Date   CHOLECYSTECTOMY     TUBAL LIGATION      FAMILY  HISTORY: Family History  Problem Relation Age of Onset   Hypertension Mother    Diabetes Mother    Stroke Mother    Breast cancer Mother     ADVANCED DIRECTIVES (Y/N):  N  HEALTH MAINTENANCE: Social History   Tobacco Use   Smoking status: Current Every Day Smoker    Packs/day: 0.50    Years: 20.00    Pack years: 10.00    Types: Cigarettes   Smokeless tobacco: Never Used  Substance Use Topics   Alcohol use: Yes    Comment: 2-3 week.     Drug use: No     Colonoscopy:  PAP:  Bone density:  Lipid panel:  No Known Allergies  Current Outpatient Medications  Medication Sig Dispense Refill   acetaminophen (TYLENOL) 325 MG tablet Take 650 mg by mouth every 6 (six) hours as needed for mild pain or fever.      albuterol (PROVENTIL HFA;VENTOLIN HFA) 108 (90 Base) MCG/ACT inhaler Inhale 2 puffs into the lungs every 4 (four) hours as needed for wheezing or shortness of breath. 1 Inhaler 0   Rivaroxaban 15 & 20 MG TBPK Follow package directions: Take one 15mg  tablet by mouth twice a day. On day 22, switch to one 20mg  tablet once a day. Take with food. 51 each 0   dextromethorphan-guaiFENesin (TUSSIN DM) 10-100 MG/5ML liquid Take 5 mLs by mouth every 6 (six) hours as needed for cough. (Patient not taking: Reported on 07/23/2019) 118 mL 0   nabumetone (RELAFEN) 750 MG tablet Take 1 tablet (750 mg total) by mouth 2 (two) times daily. (Patient not taking: Reported on 07/23/2019) 30 tablet 0  oxyCODONE 10 MG TABS Take 1 tablet (10 mg total) by mouth every 6 (six) hours as needed for moderate pain or severe pain. (Patient not taking: Reported on 08/11/2019) 15 tablet 0   rivaroxaban (XARELTO) 20 MG TABS tablet Take 1 tablet (20 mg total) by mouth daily with supper. 30 tablet 5   senna-docusate (SENOKOT-S) 8.6-50 MG tablet Take 2 tablets by mouth at bedtime as needed for mild constipation or moderate constipation. (Patient not taking: Reported on 08/11/2019) 60 tablet 0    tamsulosin (FLOMAX) 0.4 MG CAPS capsule Take 1 capsule (0.4 mg total) by mouth daily after breakfast. 30 capsule 0   No current facility-administered medications for this visit.     OBJECTIVE: Vitals:   08/11/19 1340  BP: 131/75  Pulse: (!) 108  Temp: 98.7 F (37.1 C)     Body mass index is 32.49 kg/m.    ECOG FS:0 - Asymptomatic  General: Well-developed, well-nourished, no acute distress. Eyes: Pink conjunctiva, anicteric sclera. HEENT: Normocephalic, moist mucous membranes, clear oropharnyx. Lungs: Clear to auscultation bilaterally. Heart: Regular rate and rhythm. No rubs, murmurs, or gallops. Abdomen: Soft, nontender, nondistended. No organomegaly noted, normoactive bowel sounds. Musculoskeletal: No edema, cyanosis, or clubbing. Neuro: Alert, answering all questions appropriately. Cranial nerves grossly intact. Skin: No rashes or petechiae noted. Psych: Normal affect.  LAB RESULTS:  Lab Results  Component Value Date   NA 142 07/26/2019   K 3.3 (L) 07/26/2019   CL 105 07/26/2019   CO2 29 07/26/2019   GLUCOSE 86 07/26/2019   BUN 6 07/26/2019   CREATININE 0.76 07/26/2019   CALCIUM 8.5 (L) 07/26/2019   PROT 6.2 (L) 07/23/2019   ALBUMIN 3.1 (L) 07/23/2019   AST 16 07/23/2019   ALT 9 07/23/2019   ALKPHOS 49 07/23/2019   BILITOT 0.8 07/23/2019   GFRNONAA >60 07/26/2019   GFRAA >60 07/26/2019    Lab Results  Component Value Date   WBC 8.2 07/26/2019   NEUTROABS 8.2 (H) 07/23/2019   HGB 9.2 (L) 07/26/2019   HCT 31.6 (L) 07/26/2019   MCV 88.0 07/26/2019   PLT 244 07/26/2019     STUDIES: Dg Chest 2 View  Result Date: 07/23/2019 CLINICAL DATA:  Fever and shortness of breath. Right back and chest pain with inspiration. EXAM: CHEST - 2 VIEW COMPARISON:  12/01/2018 FINDINGS: Linear densities in the mid right lung. Patchy densities in the right lower chest. Right chest densities are predominantly in the anterior aspect of the right chest. No large pleural effusions.  Left lung is clear. Heart and mediastinum are within normal limits. Trachea is midline. Negative for a pneumothorax. Bone structures are unremarkable. IMPRESSION: Patchy and linear densities in the right chest. Some of the densities may represent postinflammatory changes from previous pneumonia but there is concern for acute infectious or inflammatory process. Electronically Signed   By: Markus Daft M.D.   On: 07/23/2019 12:16   Ct Angio Chest Pe W And/or Wo Contrast  Result Date: 07/23/2019 CLINICAL DATA:  Shortness of breath. EXAM: CT ANGIOGRAPHY CHEST WITH CONTRAST TECHNIQUE: Multidetector CT imaging of the chest was performed using the standard protocol during bolus administration of intravenous contrast. Multiplanar CT image reconstructions and MIPs were obtained to evaluate the vascular anatomy. CONTRAST:  9mL OMNIPAQUE IOHEXOL 350 MG/ML SOLN, 37mL OMNIPAQUE IOHEXOL 350 MG/ML SOLN COMPARISON:  November 29, 2018 FINDINGS: Cardiovascular: The thoracic aorta is nonaneurysmal without dissection or atherosclerotic change. The heart is unremarkable. Evaluation of the pulmonary arteries is somewhat limited due  to timing of contrast. However, pulmonary emboli are identified. An embolus is seen within a segmental branch of the left lower lobe on axial image 125. A pulmonary embolus is seen within the segmental branch of the right lower lobe as well on axial image 145. No upper lobe emboli identified. No central emboli identified. Mediastinum/Nodes: Thyroid and esophagus are normal. Mildly prominent node in the right hilum is more prominent in the interval measuring 1.5 cm, favored to be reactive. No other adenopathy. There is a small right pleural effusion which is larger in the interval. No pericardial effusion. Lungs/Pleura: Central airways are normal. No pneumothorax. Patchy opacity is seen in the right upper and lower lobes. Much of the opacity is platelike suggesting at least some scar atelectasis. However,  superimposed patchy pneumonia not excluded. No infiltrate seen in the left lung. A rounded nodular density is seen in the anterior right lung on axial image 59 measuring 1.8 cm. No other nodules or masses. Upper Abdomen: Renal stones are seen bilaterally. There is a stone in the left renal pelvis measuring up to 1.3 cm with mild hydronephrosis on the left and stranding adjacent to the renal pelvis. This is similar in appearance when compared to the November 27, 2007 study. Multiple other stones are seen in the left kidney. There is a mass abutting the left adrenal gland measuring 5.6 cm. This mass is new when compared to a CT of the abdomen and pelvis from Jan 27, 2008. The patient is status post cholecystectomy. Musculoskeletal: No chest wall abnormality. No acute or significant osseous findings. Review of the MIP images confirms the above findings. IMPRESSION: 1. The study is positive for pulmonary emboli in the bilateral lower lobes with no evidence of strain. 2. There is a nodule along the medial right anterior lung on series 4, image 60 measuring 1.8 cm. This was not present in March of 2020. This could represent focal rounded atelectasis given the short interval follow-up but neoplasm/malignancy is not excluded on this study. Given the new mass thought to arise from the left renal gland described below, recommend a PET-CT for further evaluation of this nodule. 3. 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 The mass is nonspecific but malignancy/neoplasm not excluded. Given the new nodular density in the anterior right lung, recommend a PET-CT for further evaluation. 4. Prominent node in the right hilum. This may be reactive. Recommend attention on the recommended PET-CT. 5. Patchy opacities in the lungs. Some of the opacities are clearly atelectasis and/or scar. A superimposed multifocal infectious process is not excluded. 6. There is a stone  in the left renal pelvis measuring up to 1.3 cm. Mild left hydronephrosis and fat stranding adjacent to the left renal pelvis suggests an acute component. Finding is similar when compared to 2009 however. 7. Multiple other stones in both kidneys. Aortic Atherosclerosis (ICD10-I70.0). Electronically Signed   By: Dorise Bullion III M.D   On: 07/23/2019 15:25   US Renal  Result Date: 07/25/2019 CLINICAL DATA:  Obstructive nephropathy EXAM: RENAL / URINARY TRACT ULTRASOUND COMPLETE COMPARISON:  None. FINDINGS: Right Kidney: Renal measurements: 11.6 x 4.4 x 4.7 cm = volume: 125.8 mL. Normal renal cortical thickness and echogenicity. No hydronephrosis. There is a simple appearing 2.0 x 1.7 x 1.7 cm lower pole cyst. Left Kidney: Renal measurements: 10.3 x 4.9 x 4.7 cm = volume: 124.2 mL. Normal renal cortical thickness and echogenicity  without focal lesions or hydronephrosis. Bladder: Appears normal for degree of bladder distention. Other: None. IMPRESSION: Unremarkable renal ultrasound examination. No worrisome renal lesions or hydronephrosis. Simple appearing lower pole right renal cyst. Electronically Signed   By: Marijo Sanes M.D.   On: 07/25/2019 11:54   US Venous Img Lower Bilateral  Result Date: 07/24/2019 CLINICAL DATA:  Pulmonary emboli. EXAM: BILATERAL LOWER EXTREMITY VENOUS DOPPLER ULTRASOUND TECHNIQUE: Gray-scale sonography with graded compression, as well as color Doppler and duplex ultrasound were performed to evaluate the lower extremity deep venous systems from the level of the common femoral vein and including the common femoral, femoral, profunda femoral, popliteal and calf veins including the posterior tibial, peroneal and gastrocnemius veins when visible. The superficial great saphenous vein was also interrogated. Spectral Doppler was utilized to evaluate flow at rest and with distal augmentation maneuvers in the common femoral, femoral and popliteal veins. COMPARISON:  None. FINDINGS: RIGHT  LOWER EXTREMITY Common Femoral Vein: No evidence of thrombus. Normal compressibility, respiratory phasicity and response to augmentation. Saphenofemoral Junction: No evidence of thrombus. Normal compressibility and flow on color Doppler imaging. Profunda Femoral Vein: No evidence of thrombus. Normal compressibility and flow on color Doppler imaging. Femoral Vein: No evidence of thrombus. Normal compressibility, respiratory phasicity and response to augmentation. Popliteal Vein: No evidence of thrombus. Normal compressibility, respiratory phasicity and response to augmentation. Calf Veins: No evidence of thrombus. Normal compressibility and flow on color Doppler imaging. Superficial Great Saphenous Vein: No evidence of thrombus. Normal compressibility. Venous Reflux:  None. Other Findings:  None. LEFT LOWER EXTREMITY Common Femoral Vein: No evidence of thrombus. Normal compressibility, respiratory phasicity and response to augmentation. Saphenofemoral Junction: No evidence of thrombus. Normal compressibility and flow on color Doppler imaging. Profunda Femoral Vein: No evidence of thrombus. Normal compressibility and flow on color Doppler imaging. Femoral Vein: No evidence of thrombus. Normal compressibility, respiratory phasicity and response to augmentation. Popliteal Vein: No evidence of thrombus. Normal compressibility, respiratory phasicity and response to augmentation. Calf Veins: No evidence of thrombus. Normal compressibility and flow on color Doppler imaging. Superficial Great Saphenous Vein: No evidence of thrombus. Normal compressibility. Venous Reflux:  None. Other Findings:  None. IMPRESSION: No evidence of deep venous thrombosis in either lower extremity. Electronically Signed   By: Marijo Sanes M.D.   On: 07/24/2019 12:46    ASSESSMENT: Bilateral pulmonary embolism, lung nodule.  PLAN:    1.  Bilateral pulmonary embolism: Patient did not appear to have any transient risk factors, although  obesity, tobacco use, and sedentary lifestyle do increase her risk.  Full hypercoagulable work-up is negative.  Patient is tolerating Xarelto well without significant side effects.  She will require a minimum of 6 months of treatment completing in April 2020.  Patient will have follow-up at that time to determine whether extended therapy is warranted. 2. Right lung nodule: CT scan results from July 23, 2019 reviewed independently and reported as above with a 1.8 cm lesion in the right anterior lung that was not present on imaging in March 2020.    She is also noted to have a left adrenal nodule.  We will get a PET scan to further evaluate.  Patient will have video assisted telemedicine visit 1 to 2 days after her imaging to discuss the results.   3.  Left adrenal nodule: PET scan as above. 4.  Kidney stone: Patient was given a refill for her Flomax today until she has evaluation by urology.  Patient expressed understanding and was in  agreement with this plan. She also understands that She can call clinic at any time with any questions, concerns, or complaints.    Lloyd Huger, MD   08/12/2019 9:07 AM

## 2019-08-10 ENCOUNTER — Other Ambulatory Visit: Payer: Self-pay

## 2019-08-11 ENCOUNTER — Other Ambulatory Visit: Payer: Self-pay | Admitting: Emergency Medicine

## 2019-08-11 ENCOUNTER — Encounter: Payer: Self-pay | Admitting: Oncology

## 2019-08-11 ENCOUNTER — Inpatient Hospital Stay: Payer: Self-pay | Attending: Oncology | Admitting: Oncology

## 2019-08-11 ENCOUNTER — Other Ambulatory Visit: Payer: Self-pay

## 2019-08-11 VITALS — BP 131/75 | HR 108 | Temp 98.7°F | Wt 220.0 lb

## 2019-08-11 DIAGNOSIS — I2694 Multiple subsegmental pulmonary emboli without acute cor pulmonale: Secondary | ICD-10-CM | POA: Insufficient documentation

## 2019-08-11 DIAGNOSIS — F17210 Nicotine dependence, cigarettes, uncomplicated: Secondary | ICD-10-CM | POA: Insufficient documentation

## 2019-08-11 DIAGNOSIS — E278 Other specified disorders of adrenal gland: Secondary | ICD-10-CM | POA: Insufficient documentation

## 2019-08-11 DIAGNOSIS — Z79899 Other long term (current) drug therapy: Secondary | ICD-10-CM | POA: Insufficient documentation

## 2019-08-11 DIAGNOSIS — R5381 Other malaise: Secondary | ICD-10-CM | POA: Insufficient documentation

## 2019-08-11 DIAGNOSIS — Z7901 Long term (current) use of anticoagulants: Secondary | ICD-10-CM | POA: Insufficient documentation

## 2019-08-11 DIAGNOSIS — Z9049 Acquired absence of other specified parts of digestive tract: Secondary | ICD-10-CM | POA: Insufficient documentation

## 2019-08-11 DIAGNOSIS — R911 Solitary pulmonary nodule: Secondary | ICD-10-CM | POA: Insufficient documentation

## 2019-08-11 DIAGNOSIS — Z791 Long term (current) use of non-steroidal anti-inflammatories (NSAID): Secondary | ICD-10-CM | POA: Insufficient documentation

## 2019-08-11 DIAGNOSIS — R5383 Other fatigue: Secondary | ICD-10-CM | POA: Insufficient documentation

## 2019-08-11 DIAGNOSIS — N200 Calculus of kidney: Secondary | ICD-10-CM

## 2019-08-11 MED ORDER — TAMSULOSIN HCL 0.4 MG PO CAPS: 0.4000 mg | ORAL_CAPSULE | Freq: Every day | ORAL | 0 refills | Status: AC

## 2019-08-11 MED ORDER — RIVAROXABAN 20 MG PO TABS: 20.0000 mg | ORAL_TABLET | Freq: Every day | ORAL | 5 refills | Status: DC

## 2019-08-11 NOTE — Progress Notes (Signed)
Pt new patient, referred for PE. No specific complaints or concerns.

## 2019-08-23 ENCOUNTER — Other Ambulatory Visit: Payer: Self-pay

## 2019-08-23 ENCOUNTER — Ambulatory Visit
Admission: RE | Admit: 2019-08-23 | Discharge: 2019-08-23 | Disposition: A | Discharge status: Home / Self Care | Payer: Self-pay | Source: Ambulatory Visit | Attending: Oncology | Admitting: Oncology

## 2019-08-23 DIAGNOSIS — D3502 Benign neoplasm of left adrenal gland: Secondary | ICD-10-CM | POA: Insufficient documentation

## 2019-08-23 DIAGNOSIS — D3501 Benign neoplasm of right adrenal gland: Secondary | ICD-10-CM | POA: Insufficient documentation

## 2019-08-23 DIAGNOSIS — R911 Solitary pulmonary nodule: Secondary | ICD-10-CM | POA: Insufficient documentation

## 2019-08-23 LAB — GLUCOSE, CAPILLARY: Glucose-Capillary: 132 mg/dL — ABNORMAL HIGH (ref 70–99)

## 2019-08-23 MED ORDER — FLUDEOXYGLUCOSE F - 18 (FDG) INJECTION
11.5800 | Freq: Once | INTRAVENOUS | Status: AC | PRN
  Administered 2019-08-23: 11.58 via INTRAVENOUS

## 2019-08-24 ENCOUNTER — Ambulatory Visit (INDEPENDENT_AMBULATORY_CARE_PROVIDER_SITE_OTHER): Payer: Self-pay | Admitting: Urology

## 2019-08-24 ENCOUNTER — Encounter: Payer: Self-pay | Admitting: Urology

## 2019-08-24 VITALS — BP 150/89 | HR 90 | Ht 69.0 in | Wt 223.0 lb

## 2019-08-24 DIAGNOSIS — N200 Calculus of kidney: Secondary | ICD-10-CM

## 2019-08-24 MED ORDER — OXYCODONE HCL 10 MG PO TABS: 10.0000 mg | ORAL_TABLET | Freq: Four times a day (QID) | ORAL | 0 refills | Status: DC | PRN

## 2019-08-24 NOTE — Progress Notes (Signed)
08/24/2019 3:11 PM   Katrina Page 1981-08-21 [id number]  Referring provider: No referring provider defined for this encounter.  Chief Complaint  Patient presents with  . Hospitalization Follow-up    HPI: 38 y.o. female recently admitted for chest pain and found to have bilateral lower lobe pulmonary emboli.  She was incidentally noted on her chest CT to have a 13 mm left renal pelvic stone.  She apparently underwent PCNL at [facility] in 2009 for a 12 mm left UPJ stone.  Abdominal imaging in July 2017 was remarkable for a left renal pelvic calculus.  She complains of chronic mid back pain.  Denies flank or abdominal pain.  No gross hematuria.   PMH: Past Medical History:  Diagnosis Date  . Migraines     Surgical History: Past Surgical History:  Procedure Laterality Date  . CHOLECYSTECTOMY    . TUBAL LIGATION      Home Medications:  Allergies as of 08/24/2019   No Known Allergies     Medication List       Accurate as of August 24, 2019  3:11 PM. If you have any questions, ask your nurse or doctor.        STOP taking these medications   dextromethorphan-guaiFENesin 10-100 MG/5ML liquid Commonly known as: Tussin DM Stopped by: Abbie Sons, MD   nabumetone 750 MG tablet Commonly known as: RELAFEN Stopped by: Abbie Sons, MD     TAKE these medications   acetaminophen 325 MG tablet Commonly known as: TYLENOL Take 650 mg by mouth every 6 (six) hours as needed for mild pain or fever.   albuterol 108 (90 Base) MCG/ACT inhaler Commonly known as: VENTOLIN HFA Inhale 2 puffs into the lungs every 4 (four) hours as needed for wheezing or shortness of breath.   Oxycodone HCl 10 MG Tabs Take 1 tablet (10 mg total) by mouth every 6 (six) hours as needed for moderate pain or severe pain.   Rivaroxaban 15 & 20 MG Tbpk Follow package directions: Take one 15mg  tablet by mouth twice a day. On day 22, switch to one 20mg  tablet once a day. Take with food.    rivaroxaban 20 MG Tabs tablet Commonly known as: Xarelto Take 1 tablet (20 mg total) by mouth daily with supper.   senna-docusate 8.6-50 MG tablet Commonly known as: Senokot-S Take 2 tablets by mouth at bedtime as needed for mild constipation or moderate constipation.   tamsulosin 0.4 MG Caps capsule Commonly known as: FLOMAX Take 1 capsule (0.4 mg total) by mouth daily after breakfast.       Allergies: No Known Allergies  Family History: Family History  Problem Relation Age of Onset  . Hypertension Mother   . Diabetes Mother   . Stroke Mother   . Breast cancer Mother     Social History:  reports that she has been smoking cigarettes. She has a 10.00 pack-year smoking history. She has never used smokeless tobacco. She reports current alcohol use. She reports that she does not use drugs.  ROS: UROLOGY Frequent Urination?: No Hard to postpone urination?: No Burning/pain with urination?: No Get up at night to urinate?: Yes Leakage of urine?: No Urine stream starts and stops?: No Trouble starting stream?: No Do you have to strain to urinate?: No Blood in urine?: No Urinary tract infection?: No Sexually transmitted disease?: No Injury to kidneys or bladder?: Yes Painful intercourse?: No Weak stream?: No Currently pregnant?: No Vaginal bleeding?: No Last menstrual period?: N/A  Gastrointestinal Nausea?: Yes Vomiting?: No Indigestion/heartburn?: No Diarrhea?: No Constipation?: No  Constitutional Fever: No Night sweats?: Yes Weight loss?: Yes Fatigue?: No  Skin Skin rash/lesions?: No Itching?: No  Eyes Blurred vision?: No Double vision?: No  Ears/Nose/Throat Sore throat?: No Sinus problems?: No  Hematologic/Lymphatic Swollen glands?: No Easy bruising?: Yes  Cardiovascular Leg swelling?: Yes Chest pain?: Yes  Respiratory Cough?: No Shortness of breath?: Yes  Endocrine Excessive thirst?: No  Musculoskeletal Back pain?: Yes Joint pain?:  Yes  Neurological Headaches?: Yes Dizziness?: No  Psychologic Depression?: No Anxiety?: No  Physical Exam: BP (!) 150/89 (BP Location: Left Arm, Patient Position: Sitting, Cuff Size: Normal)   Pulse 90   Ht 5\' 9"  (1.753 m)   Wt 223 lb (101.2 kg)   LMP 08/22/2019 Comment: Tubal Ligation   BMI 32.93 kg/m   Constitutional:  Alert and oriented, No acute distress. HEENT: [place] AT, moist mucus membranes.  Trachea midline, no masses. Cardiovascular: No clubbing, cyanosis, or edema.   Respiratory: Normal respiratory effort, no increased work of breathing.     Assessment & Plan:    - Nephrolithiasis 13 mm left renal pelvic stone without significant hydronephrosis.  We had a discussion regarding her mid back pain and I felt it is unlikely her pain is related to the stone.  Based on size and location I would recommend treatment however it was again stressed she most likely will have persistent pain.  Stone density is 1300 HU making shockwave lithotripsy less successful.  Ureteroscopy and PCNL were both discussed.  We discussed a higher complication rate with PCL and her anticoagulation.  She has elected to pursue ureteroscopic removal.  She has an appointment with Dr. Grayland Ormond in heme-onc on 12/10.  Will get recommendations regarding need for Lovenox bridge.  The indications and nature of the planned procedure were discussed as well as the potential  benefits and expected outcome.  Alternatives have been discussed in detail. The most common complications and side effects were discussed including but not limited to infection/sepsis; blood loss; damage to urethra, bladder, ureter, kidney; need for multiple surgeries; need for prolonged stent placement as well as general anesthesia risks. Although uncommon she was also informed of the possibility that the calculus may not be able to be treated due to inability to obtain access to the upper ureter. In that event she would require stent placement and a  follow-up procedure after a period of stent dilation. All of her questions were answered and she desires to proceed.  At the time of our visit he she did not appear to be distracted or in pain.  She requested a refill of oxycodone for her back pain.  Although I do not feel her pain is related to her stone I gave her a limited refill since she does not have a PCP.  She is in the process of establishing a primary care provider.   Abbie Sons, [place] [address], [place], [place] [address] [phone]

## 2019-08-24 NOTE — Patient Instructions (Addendum)
Dr. Grayland Ormond [phone]. Call this office to discuss your medication (blood thinner)

## 2019-08-25 ENCOUNTER — Encounter: Payer: Self-pay | Admitting: Urology

## 2019-08-25 DIAGNOSIS — N200 Calculus of kidney: Secondary | ICD-10-CM | POA: Insufficient documentation

## 2019-08-26 ENCOUNTER — Telehealth: Payer: Self-pay | Admitting: *Deleted

## 2019-08-26 NOTE — Telephone Encounter (Signed)
Patient is needing a refill of her Xarelto and cannot afford the $500 co pay for it and was told to contact us to see if she can get assistance with this

## 2019-08-26 NOTE — Telephone Encounter (Signed)
Don't we have samples or is eliquis maybe cheaper?

## 2019-08-26 NOTE — Telephone Encounter (Signed)
Oral Chemotherapy Pharmacist Encounter   Katrina Page was not able to find any prescription insurance when she completed a benefits investigation. If she is uninsured both Xarelto and Eliquis will be expensive. We have samples of Xarelto in the office we can give her while we figure out assistance. Katrina Page is going to reach out to her to discuss further.   Darl Pikes, PharmD, BCPS, [facility] Hematology/Oncology Clinical Pharmacist [facility] [phone]  08/26/2019 3:50 PM

## 2019-08-28 NOTE — Progress Notes (Signed)
[place]  [phone] [phone]  ID: Katrina Page OB: 08-03-1981  MR#: [id number]  [id number]  Patient Care Team: Patient, No Pcp Per as PCP - General (General Practice)  I connected with Katrina Page on 09/02/19 at  3:30 PM EST by video enabled telemedicine visit and verified that I am speaking with the correct person using two identifiers.   I discussed the limitations, risks, security and privacy concerns of performing an evaluation and management service by telemedicine and the availability of in-person appointments. I also discussed with the patient that there may be a patient responsible charge related to this service. The patient expressed understanding and agreed to proceed.   Other persons participating in the visit and their role in the encounter: Patient, MD.  Patient's location: Home. Provider's location: Clinic.  CHIEF COMPLAINT: Bilateral pulmonary embolism, lung nodule.  INTERVAL HISTORY: Patient agreed to video enabled telemedicine visit for further evaluation and discussion of her PET scan results.  She continues to complain of pain, but otherwise feels well.  She has no neurologic complaints.  She denies any recent fevers or illnesses.  She has a good appetite and denies weight loss.  She denies any chest pain, shortness of breath, cough, or hemoptysis. She has no nausea, vomiting, constipation, or diarrhea.  She has no urinary complaints.  Patient offers no further specific complaints today.  REVIEW OF SYSTEMS:   Review of Systems  Constitutional: Negative.  Negative for fever, malaise/fatigue and weight loss.  Respiratory: Negative.  Negative for cough, hemoptysis and shortness of breath.   Cardiovascular: Negative.  Negative for chest pain and leg swelling.  Gastrointestinal: Negative.  Negative for abdominal pain.  Genitourinary: Positive for flank pain. Negative for dysuria and hematuria.  Musculoskeletal:  Positive for back pain, joint pain and myalgias.  Skin: Negative.  Negative for rash.  Neurological: Negative.  Negative for dizziness, focal weakness, weakness and headaches.  Psychiatric/Behavioral: Negative.  The patient is not nervous/anxious.     As per HPI. Otherwise, a complete review of systems is negative.  PAST MEDICAL HISTORY: Past Medical History:  Diagnosis Date  . Migraines     PAST SURGICAL HISTORY: Past Surgical History:  Procedure Laterality Date  . CHOLECYSTECTOMY    . TUBAL LIGATION      FAMILY HISTORY: Family History  Problem Relation Age of Onset  . Hypertension Mother   . Diabetes Mother   . Stroke Mother   . Breast cancer Mother     ADVANCED DIRECTIVES (Y/N):  N  HEALTH MAINTENANCE: Social History   Tobacco Use  . Smoking status: Current Every Day Smoker    Packs/day: 0.50    Years: 20.00    Pack years: 10.00    Types: Cigarettes  . Smokeless tobacco: Never Used  Substance Use Topics  . Alcohol use: Yes    Comment: 2-3 week.    . Drug use: No     Colonoscopy:  PAP:  Bone density:  Lipid panel:  No Known Allergies  Current Outpatient Medications  Medication Sig Dispense Refill  . acetaminophen (TYLENOL) 325 MG tablet Take 650 mg by mouth every 6 (six) hours as needed for mild pain or fever.     Marland Kitchen albuterol (PROVENTIL HFA;VENTOLIN HFA) 108 (90 Base) MCG/ACT inhaler Inhale 2 puffs into the lungs every 4 (four) hours as needed for wheezing or shortness of breath. 1 Inhaler 0  . rivaroxaban (XARELTO) 20 MG TABS tablet Take 1 tablet (20 mg total)  by mouth daily with supper. 30 tablet 5  . tamsulosin (FLOMAX) 0.4 MG CAPS capsule Take 1 capsule (0.4 mg total) by mouth daily after breakfast. 30 capsule 0  . ketorolac (TORADOL) 10 MG tablet Take 1 tablet (10 mg total) by mouth every 6 (six) hours as needed. (Patient not taking: Reported on 09/01/2019) 20 tablet 0  . Oxycodone HCl 10 MG TABS Take 1 tablet (10 mg total) by mouth every 6 (six)  hours as needed. (Patient not taking: Reported on 09/01/2019) 15 tablet 0  . Rivaroxaban 15 & 20 MG TBPK Follow package directions: Take one 15mg  tablet by mouth twice a day. On day 22, switch to one 20mg  tablet once a day. Take with food. (Patient not taking: Reported on 09/01/2019) 51 each 0   No current facility-administered medications for this visit.    OBJECTIVE: There were no vitals filed for this visit.   There is no height or weight on file to calculate BMI.    ECOG FS:0 - Asymptomatic  General: Well-developed, well-nourished, no acute distress. HEENT: Normocephalic. Neuro: Alert, answering all questions appropriately. Cranial nerves grossly intact. Psych: Normal affect.   LAB RESULTS:  Lab Results  Component Value Date   NA 142 07/26/2019   K 3.3 (L) 07/26/2019   CL 105 07/26/2019   CO2 29 07/26/2019   GLUCOSE 86 07/26/2019   BUN 6 07/26/2019   CREATININE 0.76 07/26/2019   CALCIUM 8.5 (L) 07/26/2019   PROT 6.2 (L) 07/23/2019   ALBUMIN 3.1 (L) 07/23/2019   AST 16 07/23/2019   ALT 9 07/23/2019   ALKPHOS 49 07/23/2019   BILITOT 0.8 07/23/2019   GFRNONAA >60 07/26/2019   GFRAA >60 07/26/2019    Lab Results  Component Value Date   WBC 8.2 07/26/2019   NEUTROABS 8.2 (H) 07/23/2019   HGB 9.2 (L) 07/26/2019   HCT 31.6 (L) 07/26/2019   MCV 88.0 07/26/2019   PLT 244 07/26/2019     STUDIES: NM PET Image Initial (PI) Skull Base To Thigh  Result Date: 08/23/2019 CLINICAL DATA:  Initial treatment strategy for solitary pulmonary nodule. EXAM: NUCLEAR MEDICINE PET SKULL BASE TO THIGH TECHNIQUE: 11.6 mCi F-18 FDG was injected intravenously. Full-ring PET imaging was performed from the skull base to thigh after the radiotracer. CT data was obtained and used for attenuation correction and anatomic localization. Fasting blood glucose: 132 mg/dl COMPARISON:  CTA chest dated 07/23/2019. FINDINGS: Mediastinal blood pool activity: SUV max 1.9 Liver activity: SUV max NA NECK: No  hypermetabolic cervical lymphadenopathy. Incidental CT findings: none CHEST: No suspicious pulmonary nodules. Specifically, the prior nodular opacity in the anterior right middle lobe has resolved. Additional patchy opacities in the lungs bilaterally have resolved. These findings were likely infectious/inflammatory or possibly related to the patient's pulmonary emboli at the time of the prior CT. Prior small right pleural effusion has resolved. No hypermetabolic thoracic lymphadenopathy. Incidental CT findings: none ABDOMEN/PELVIS: 3.8 x 5.6 cm left adrenal adenoma (series 3/image 185), with characteristic intracellular lipid on unenhanced CT. Max SUV 3.5. Additional 14 mm right adrenal adenoma (series 3/image 138), benign. No hypermetabolic abdominopelvic lymphadenopathy. Incidental CT findings: 13 mm obstructing calculus in the left renal collecting system (series 3/image 163). Associated mild left hydronephrosis. Additional bilateral nonobstructing renal calculi measuring up to 4 mm. Mild increased rectal stool burden. SKELETON: No focal hypermetabolic activity to suggest skeletal metastasis. Incidental CT findings: none IMPRESSION: Prior nodular opacity in the anterior right middle lobe has resolved. No suspicious pulmonary nodules. 5.6  cm benign left adrenal adenoma. 14 mm benign right adrenal adenoma. No findings suspicious for malignancy. Electronically Signed   By: Julian Hy M.D.   On: 08/23/2019 14:55    ASSESSMENT: Bilateral pulmonary embolism, lung nodule.  PLAN:    1.  Bilateral pulmonary embolism: Patient did not appear to have any transient risk factors, although obesity, tobacco use, and sedentary lifestyle do increase her risk.  Full hypercoagulable work-up is negative.  Patient is tolerating Xarelto well without significant side effects.  Continue treatment for a total of 6 months completing in April 2020.  Patient will follow up at the end of April for further evaluation and likely  discontinuation of treatment. 2. Right lung nodule: CT scan results from July 23, 2019 reviewed independently with a 1.8 cm lesion in the right anterior lung that was not present on imaging in March 2020.  PET scan results from August 23, 2019 reviewed independently and report as above with complete resolution of nodule and no other suspicious lesions.  Left adrenal nodule is not FDG avid and likely a benign adrenal adenoma.  No further imaging is necessary at this time. 3.  Kidney stones: Continue follow-up with urology as indicated.   I provided 15 minutes of face-to-face video visit time during this encounter, and > 50% was spent counseling as documented under my assessment & plan.   Patient expressed understanding and was in agreement with this plan. She also understands that She can call clinic at any time with any questions, concerns, or complaints.    Lloyd Huger, MD   09/02/2019 9:52 AM

## 2019-08-29 NOTE — Telephone Encounter (Signed)
Spoke to patient.  She is going to stop by tomorrow to get samples and sign application for assistance.

## 2019-08-29 NOTE — Telephone Encounter (Signed)
Left a message on patients voicemail on Friday.  I called again today and had to leave another message.  [place] Patient [place] Phone [phone] Fax [phone] 08/29/2019 11:26 AM

## 2019-08-30 ENCOUNTER — Telehealth: Payer: Self-pay | Admitting: Pharmacy Technician

## 2019-08-31 ENCOUNTER — Telehealth: Payer: Self-pay | Admitting: Urology

## 2019-08-31 ENCOUNTER — Other Ambulatory Visit: Payer: Self-pay | Admitting: *Deleted

## 2019-08-31 NOTE — Telephone Encounter (Signed)
Pt requests a refill for Oxycodone, she states that she is having back pain due to her kidney stones.

## 2019-08-31 NOTE — Telephone Encounter (Signed)
At our visit last week I explained I did not think her stone was causing her pain as it was very atypical for kidney stone pain.  Due to narcotic prescribing guidelines I will not be able to refill her oxycodone.  I would be happy to send in a prescription for ketorolac.

## 2019-09-01 ENCOUNTER — Inpatient Hospital Stay: Payer: Self-pay | Attending: Oncology | Admitting: Oncology

## 2019-09-01 ENCOUNTER — Telehealth: Payer: Self-pay

## 2019-09-01 ENCOUNTER — Other Ambulatory Visit: Payer: Self-pay

## 2019-09-01 ENCOUNTER — Telehealth: Payer: Self-pay | Admitting: Pharmacy Technician

## 2019-09-01 DIAGNOSIS — Z7901 Long term (current) use of anticoagulants: Secondary | ICD-10-CM

## 2019-09-01 DIAGNOSIS — I2694 Multiple subsegmental pulmonary emboli without acute cor pulmonale: Secondary | ICD-10-CM

## 2019-09-01 MED ORDER — KETOROLAC TROMETHAMINE 10 MG PO TABS: 10.0000 mg | ORAL_TABLET | Freq: Four times a day (QID) | ORAL | 0 refills | Status: DC | PRN

## 2019-09-01 NOTE — Progress Notes (Signed)
Patient prescreened for appointment. Pt hoping for refill on oxy today

## 2019-09-01 NOTE — Telephone Encounter (Signed)
Pt returns call and states that she would like rx for Ketoralac.

## 2019-09-01 NOTE — Telephone Encounter (Signed)
Called pt, no answer. Unable to leave message as no DPR is on file. Will send mychart message.

## 2019-09-01 NOTE — Telephone Encounter (Signed)
Oral Chemotherapy Pharmacist Encounter  Dispensed samples to patient:  Medication: Xarelto 20mg  Instructions: Take 1 tablet by mouth daily Quantity dispensed: 14 Days supply: 14 Manufacturer: Alphonsa Overall [id number] Exp: 02/2020  Samples dispensed to patient on 08/30/2019.  Verified by pharmacist Andria Rhein.   [place] Patient [place] Phone [phone] Fax [phone] 09/01/2019 9:53 AM

## 2019-09-02 NOTE — Telephone Encounter (Signed)
Received fax from [place] that they will need a copy of the patients tax return to finish processing application for Xarelto assistance.  I called the patient and left a message that she needed to get the tax information and to return my call.  [place] Patient [place] Phone [phone] Fax [phone] 09/02/2019 2:38 PM

## 2019-09-02 NOTE — Telephone Encounter (Signed)
Oral Oncology Patient Advocate Encounter  Met patient in [facility] lobby to complete application for [employer] PAF in an effort to reduce patient's out of pocket expense for Xarelto to $0.    Application completed and faxed to [phone] on 09/01/2019.   JJPA[employer] patient assistance phone number for follow up is [phone].   This encounter will be updated until final determination.   Katrina Page Patient [place] Phone [phone] Fax [phone] 09/02/2019 8:50 AM

## 2019-09-06 NOTE — Telephone Encounter (Signed)
Left voicemail for patient to return my call.  We must send in tax return to [place] for them to continue processing application for Xarelto assistance.

## 2019-09-12 NOTE — Telephone Encounter (Signed)
Left voicemail stating my 3rd attempt at getting in touch with the patient.  JJPAF needs the patients 2019 tax return to complete application process.  If not received, application will be discontinued until they receive the documents.

## 2019-09-14 NOTE — Telephone Encounter (Signed)
Patient called today.  She states that she only has 3 days of medication left and hasn't heard from the patient assistance program.  I told the patient I had left 3 voicemails letting her know the PAP needed her tax return so they could process her application.  She stated that if she is not on Wi-Fi that some of the message do not come through.  She is going to try to find her tax return and if not will go by [facility] [employer] to see if they can print her a copy.  She is going to come by the cancer center tomorrow.  I told her I would have another 14 days of Xarelto to get her through the holidays.  [place] Patient [place] Phone [phone] Fax [phone] 09/14/2019 2:10 PM

## 2019-09-20 ENCOUNTER — Other Ambulatory Visit: Payer: Self-pay | Admitting: Urology

## 2019-09-20 DIAGNOSIS — E278 Other specified disorders of adrenal gland: Secondary | ICD-10-CM

## 2019-09-20 NOTE — Telephone Encounter (Signed)
Patient did not come to office on 12/24.  I called and left a voicemail for the patient to return my call so she could get more samples today, 09/20/2019.

## 2019-09-21 ENCOUNTER — Telehealth: Payer: Self-pay | Admitting: Pharmacy Technician

## 2019-09-22 NOTE — Telephone Encounter (Signed)
Oral Oncology Patient Advocate Encounter  Dispensed samples to patient on 09/21/2019:  Medication: Xarelto 20mg  Instructions: Take 1 tablet by mouth daily with supper. Quantity dispensed: 14 Days supply: 14 Manufacturer: Alphonsa Overall Lot: [id number] Exp: 11/2019 Rph Verified: [place] Patient [place] Phone [phone] Fax [phone] 09/22/2019 11:55 AM

## 2019-09-22 NOTE — Telephone Encounter (Signed)
Patient stopped by the office yesterday 09/21/2019 and picked up 14 days of Xarelto samples.  I informed her that to get assistance she must get her 2019 tax return from [place] [employer].  JJPAF rep stated that if the patient checked the box that they filed a tax return they must send in their tax return to verify income.  They would not take check stubs or a W-2.  She said she will work on getting the tax return to me in the next 2 weeks.  [place] Patient [place] Phone [phone] Fax [phone] 09/22/2019 12:02 PM

## 2019-10-10 ENCOUNTER — Telehealth: Payer: Self-pay | Admitting: Pharmacy Technician

## 2019-10-10 NOTE — Telephone Encounter (Signed)
Oral Oncology Patient Advocate Encounter  Patient stopped by today to drop off financial documents for [employer] and [place].  Provided patient with another 14 day supply of Xarelto.  Income documents faxed to: [phone].  This encounter will be updated until final determination.  [place] Patient [place] Phone [phone] Fax [phone] 10/11/2019 2:32 PM

## 2019-10-10 NOTE — Telephone Encounter (Signed)
Oral Oncology Patient Advocate Encounter  Dispensed samples to patient:  Medication: Xarelto 20mg  Instructions: Take 1 tablet by mouth daily with supper. Quantity dispensed: 14 Days supply: 14 Manufacturer: Alphonsa Overall Lot: [id number]  Exp: 02/2021 Rph Verified: [place] Patient [place] Phone [phone] Fax [phone] 10/10/2019 2:49 PM

## 2019-10-14 NOTE — Telephone Encounter (Addendum)
Oral Oncology Patient Advocate Encounter  Received notification from [place] Patient [facility] that patient has been successfully enrolled into their program to receive Xarelto from the manufacturer at $0 out of pocket until 10/12/2020.  Patient will receive a pharmacy card to use at her local pharmacy.  Pharmacy card info is as followsKara [id number] ID: [id number] Group:  [id number]   I called and left a message of approval.  Patient knows to call the office with questions or concerns.   Oral Oncology Clinic will continue to follow.  [place] Patient [place] Phone [phone] Fax [phone] 10/14/2019 11:25 AM

## 2019-10-21 NOTE — Telephone Encounter (Signed)
Left voicemail for patient to return my call.    [place] Patient [place] Phone [phone] Fax [phone] 10/21/2019 10:41 AM

## 2019-11-07 ENCOUNTER — Telehealth: Payer: Self-pay | Admitting: Urology

## 2019-11-07 NOTE — Telephone Encounter (Signed)
I had recommended an endocrinology evaluation of her adrenal mass.  I put in a referral to [facility] and it looks like they could not get in touch with her to schedule an appointment.  Based on the size of this mass it most likely needs to be removed but it needs to be assessed for functioning hormones.  Please see if there is another factor she would like to be referred to.  Thanks

## 2019-12-12 NOTE — Telephone Encounter (Signed)
I finally spoke with this patient and she said she wanted to go to [facility] and I gave her the number to call them back to schedule. Not sure if we will need a new referral or not. I tried calling and could not get thru to them to ask. I will keep you posted.

## 2019-12-24 ENCOUNTER — Emergency Department
Admission: EM | Admit: 2019-12-24 | Discharge: 2019-12-28 | Disposition: A | Discharge status: Other Acute Inpatient Hospital | Payer: Self-pay | Attending: Emergency Medicine | Admitting: Emergency Medicine

## 2019-12-24 ENCOUNTER — Emergency Department: Payer: Self-pay

## 2019-12-24 ENCOUNTER — Other Ambulatory Visit: Payer: Self-pay

## 2019-12-24 DIAGNOSIS — F329 Major depressive disorder, single episode, unspecified: Secondary | ICD-10-CM | POA: Insufficient documentation

## 2019-12-24 DIAGNOSIS — Z86711 Personal history of pulmonary embolism: Secondary | ICD-10-CM | POA: Insufficient documentation

## 2019-12-24 DIAGNOSIS — R0789 Other chest pain: Secondary | ICD-10-CM | POA: Insufficient documentation

## 2019-12-24 DIAGNOSIS — F322 Major depressive disorder, single episode, severe without psychotic features: Secondary | ICD-10-CM | POA: Diagnosis present

## 2019-12-24 DIAGNOSIS — S61512A Laceration without foreign body of left wrist, initial encounter: Secondary | ICD-10-CM | POA: Insufficient documentation

## 2019-12-24 DIAGNOSIS — Y92230 Patient room in hospital as the place of occurrence of the external cause: Secondary | ICD-10-CM | POA: Insufficient documentation

## 2019-12-24 DIAGNOSIS — T50902A Poisoning by unspecified drugs, medicaments and biological substances, intentional self-harm, initial encounter: Secondary | ICD-10-CM | POA: Insufficient documentation

## 2019-12-24 DIAGNOSIS — F10920 Alcohol use, unspecified with intoxication, uncomplicated: Secondary | ICD-10-CM

## 2019-12-24 DIAGNOSIS — X788XXA Intentional self-harm by other sharp object, initial encounter: Secondary | ICD-10-CM | POA: Insufficient documentation

## 2019-12-24 DIAGNOSIS — Y939 Activity, unspecified: Secondary | ICD-10-CM | POA: Insufficient documentation

## 2019-12-24 DIAGNOSIS — F17210 Nicotine dependence, cigarettes, uncomplicated: Secondary | ICD-10-CM | POA: Insufficient documentation

## 2019-12-24 DIAGNOSIS — F1020 Alcohol dependence, uncomplicated: Secondary | ICD-10-CM | POA: Insufficient documentation

## 2019-12-24 DIAGNOSIS — Z20822 Contact with and (suspected) exposure to covid-19: Secondary | ICD-10-CM | POA: Insufficient documentation

## 2019-12-24 DIAGNOSIS — Y999 Unspecified external cause status: Secondary | ICD-10-CM | POA: Insufficient documentation

## 2019-12-24 DIAGNOSIS — Z79899 Other long term (current) drug therapy: Secondary | ICD-10-CM | POA: Insufficient documentation

## 2019-12-24 DIAGNOSIS — F1010 Alcohol abuse, uncomplicated: Secondary | ICD-10-CM | POA: Diagnosis present

## 2019-12-24 LAB — CBC
HCT: 31 % — ABNORMAL LOW (ref 36.0–46.0)
Hemoglobin: 8.7 g/dL — ABNORMAL LOW (ref 12.0–15.0)
MCH: 22.4 pg — ABNORMAL LOW (ref 26.0–34.0)
MCHC: 28.1 g/dL — ABNORMAL LOW (ref 30.0–36.0)
MCV: 79.9 fL — ABNORMAL LOW (ref 80.0–100.0)
Platelets: 179 10*3/uL (ref 150–400)
RBC: 3.88 MIL/uL (ref 3.87–5.11)
RDW: 20.7 % — ABNORMAL HIGH (ref 11.5–15.5)
WBC: 6.4 10*3/uL (ref 4.0–10.5)
nRBC: 0 % (ref 0.0–0.2)

## 2019-12-24 LAB — TROPONIN I (HIGH SENSITIVITY)
Troponin I (High Sensitivity): 4 ng/L (ref <18)
Troponin I (High Sensitivity): 5 ng/L (ref <18)

## 2019-12-24 LAB — LIPASE, BLOOD: Lipase: 55 U/L — ABNORMAL HIGH (ref 11–51)

## 2019-12-24 LAB — URINE DRUG SCREEN, QUALITATIVE (ARMC ONLY)
Amphetamines, Ur Screen: NOT DETECTED
Barbiturates, Ur Screen: NOT DETECTED
Benzodiazepine, Ur Scrn: NOT DETECTED
Cannabinoid 50 Ng, Ur ~~LOC~~: POSITIVE — AB
Cocaine Metabolite,Ur ~~LOC~~: NOT DETECTED
MDMA (Ecstasy)Ur Screen: NOT DETECTED
Methadone Scn, Ur: NOT DETECTED
Opiate, Ur Screen: NOT DETECTED
Phencyclidine (PCP) Ur S: NOT DETECTED
Tricyclic, Ur Screen: NOT DETECTED

## 2019-12-24 LAB — COMPREHENSIVE METABOLIC PANEL
ALT: 12 U/L (ref 0–44)
AST: 18 U/L (ref 15–41)
Albumin: 3.9 g/dL (ref 3.5–5.0)
Alkaline Phosphatase: 57 U/L (ref 38–126)
Anion gap: 12 (ref 5–15)
BUN: <5 mg/dL — ABNORMAL LOW (ref 6–20)
CO2: 27 mmol/L (ref 22–32)
Calcium: 8.7 mg/dL — ABNORMAL LOW (ref 8.9–10.3)
Chloride: 104 mmol/L (ref 98–111)
Creatinine, Ser: 0.92 mg/dL (ref 0.44–1.00)
GFR calc Af Amer: >60 mL/min (ref >60)
GFR calc non Af Amer: >60 mL/min (ref >60)
Glucose, Bld: 135 mg/dL — ABNORMAL HIGH (ref 70–99)
Potassium: 2.9 mmol/L — ABNORMAL LOW (ref 3.5–5.1)
Sodium: 143 mmol/L (ref 135–145)
Total Bilirubin: 0.6 mg/dL (ref 0.3–1.2)
Total Protein: 7.9 g/dL (ref 6.5–8.1)

## 2019-12-24 LAB — SALICYLATE LEVEL: Salicylate Lvl: <7 mg/dL — ABNORMAL LOW (ref 7.0–30.0)

## 2019-12-24 LAB — RESPIRATORY PANEL BY RT PCR (FLU A&B, COVID)
Influenza A by PCR: NEGATIVE
Influenza B by PCR: NEGATIVE
SARS Coronavirus 2 by RT PCR: NEGATIVE

## 2019-12-24 LAB — POCT PREGNANCY, URINE: Preg Test, Ur: NEGATIVE

## 2019-12-24 LAB — ETHANOL: Alcohol, Ethyl (B): 270 mg/dL — ABNORMAL HIGH (ref <10)

## 2019-12-24 LAB — ACETAMINOPHEN LEVEL: Acetaminophen (Tylenol), Serum: 18 ug/mL (ref 10–30)

## 2019-12-24 MED ORDER — ACETAMINOPHEN 325 MG PO TABS
650.0000 mg | ORAL_TABLET | Freq: Once | ORAL | Status: AC
  Administered 2019-12-24: 650 mg via ORAL

## 2019-12-24 MED ORDER — ACETAMINOPHEN 325 MG PO TABS
ORAL_TABLET | ORAL | Status: AC
  Filled 2019-12-24: qty 2

## 2019-12-24 MED ORDER — ACETAMINOPHEN 325 MG PO TABS
650.0000 mg | ORAL_TABLET | Freq: Four times a day (QID) | ORAL | Status: DC | PRN
  Administered 2019-12-24 – 2019-12-27 (×6): 650 mg via ORAL
  Filled 2019-12-24 (×8): qty 2

## 2019-12-24 MED ORDER — POTASSIUM CHLORIDE CRYS ER 20 MEQ PO TBCR
40.0000 meq | EXTENDED_RELEASE_TABLET | Freq: Once | ORAL | Status: AC
  Administered 2019-12-24: 40 meq via ORAL
  Filled 2019-12-24: qty 2

## 2019-12-24 NOTE — ED Notes (Signed)
Pt states that her chest hurts because her son sat on it. MD notified. See new orders.

## 2019-12-24 NOTE — ED Notes (Signed)
Pt given breakfast tray. Pt still sleeping tray placed beside bed.

## 2019-12-24 NOTE — ED Notes (Signed)
ivc by Joni Fears, MD
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
## 2019-12-24 NOTE — ED Notes (Signed)
Patient transported to X-ray with security escort due to involuntary status

## 2019-12-24 NOTE — ED Notes (Signed)
Patient admitted to taking unknown amount of trazodone, nerve pills, muscle relaxer, and pain pills along with unknown amount of vodka.

## 2019-12-24 NOTE — ED Notes (Signed)
Pt c/o new chest pain when walking. MD notified. See new orders.

## 2019-12-24 NOTE — ED Notes (Signed)
Pt walked to bathroom with x2 assist. Very unsteady but able to get there and back without issue.

## 2019-12-24 NOTE — ED Notes (Signed)
Darnelle Maffucci, NP and TTS at bedside.

## 2019-12-24 NOTE — BH Assessment (Signed)
Referral information for Psychiatric Hospitalization faxed to;   [facility] [phone]),   . [facility] [phone] or [phone])  . [facility] [phone]),   . [facility] [phone] -or- [phone]),   . [facility] [phone]).

## 2019-12-24 NOTE — ED Triage Notes (Signed)
Patient brought in by [employer] officer for possible intentional overdose. Per patient, patient's friend called EMS because patient took a lot of pills. Patient initially denied this to this RN, but patient has since stated "yes I did take a lot of pills, and yes I know that have been drinking".   Patient has a bottle of trazodone belonging to Stacy Gardner filled on 08/21/16. Patient admits to taking bottle from her mother without permission. Bottle is approximately half full.

## 2019-12-24 NOTE — ED Notes (Signed)
Pt still sleeping. Meal tray sat at bedside.
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
## 2019-12-24 NOTE — BH Assessment (Signed)
Writer spoke with the patient to complete an updated/reassessment. Patient admits to ingesting medications with the intentions of ending her life.

## 2019-12-24 NOTE — ED Notes (Signed)
This RN and Beth NT changed pateint into hospital provided clothing. Patient's belonging's placed into labeled bags. Patient's belonging's include:  Keys, pants, shirt, bra, underwear, jacket, 2 metal earrings, 2 hair ties, 2 socks, 2 shoes.  Patient retains 1 earring right ear as it could not be removed in triage.

## 2019-12-24 NOTE — Consult Note (Signed)
[facility] Consult   Reason for Consult:  Overdose with intent to kill self Referring Physician:  EDP Patient Identification: Katrina Page MRN:  [id number] Principal Diagnosis: MDD (major depressive disorder), severe ([place]) Diagnosis:  Principal Problem:   MDD (major depressive disorder), severe ([place]) Active Problems:   Alcohol abuse   Total Time spent with patient: 30 minutes  Subjective:   Katrina Page is a 39 y.o. female patient is a poor historian but is able to communicate that she has been feeling depressed and was having some suicidal thoughts.  She reports that she intentionally overdosed on medication in an attempt to kill herself.  Patient states "I fucked up.  I wish I would've died."  Patient then rolls over and doesn't want to speak anymore.  HPI:  Per EDP: 39 y.o. female with a history of migraines, pulmonary embolism, kidney stones who comes to the ED due to intoxication and suspected overdose.  Her friend called EMS due to patient taking a lot of pills.  Reportedly EMS was transporting the patient but she was combative, smoking in the ambulance, refusing to wear seatbelt, so the ambulance crew was unable to transport her.  Police came and offered her choice of going to the hospital for evaluation or being taken to jail, and she chose hospital. Patient is evasive and uncooperative with questioning.  Denies SI or HI.  States that she just wants to go home so she can sleep and go to work tomorrow.  She does endorse drinking.  When asked about taking any pills tonight, she states that everyone who says that is lying.  Patient has been seen by this provider via face-to-face.  Admitted to overdosing on medication with an attempt to kill herself.  Patient does appear to be depressed and anxious and feels that patient needs to be admitted for psychiatric treatment.  Patient is already under IVC and IVC will remain at this time.  TTS we'll seek placement for this  patient.  Past Psychiatric History: None reported  Risk to Self: Suicidal Ideation: No-Not Currently/Within Last 6 Months Suicidal Intent: No-Not Currently/Within Last 6 Months Is patient at risk for suicide?: No Suicidal Plan?: No-Not Currently/Within Last 6 Months Access to Means: Yes Specify Access to Suicidal Means: Patient had access to pills What has been your use of drugs/alcohol within the last 12 months?: Alcohol use How many times?: 0 Triggers for Past Attempts: None known Intentional Self Injurious Behavior: None Risk to Others: Homicidal Ideation: No Thoughts of Harm to Others: No Current Homicidal Intent: No Current Homicidal Plan: No Access to Homicidal Means: No History of harm to others?: No Assessment of Violence: None Noted Does patient have access to weapons?: No Criminal Charges Pending?: Yes Describe Pending Criminal Charges: Patient would not report Does patient have a court date: (Unknown) Prior Inpatient Therapy: Prior Inpatient Therapy: (Unknown) Prior Outpatient Therapy: Prior Outpatient Therapy: (Unknown)  Past Medical History:  Past Medical History:  Diagnosis Date  . Migraines     Past Surgical History:  Procedure Laterality Date  . CHOLECYSTECTOMY    . TUBAL LIGATION     Family History:  Family History  Problem Relation Age of Onset  . Hypertension Mother   . Diabetes Mother   . Stroke Mother   . Breast cancer Mother    Family Psychiatric  History: None reported Social History:  Social History   Substance and Sexual Activity  Alcohol Use Yes   Comment: 2-3 week.  Social History   Substance and Sexual Activity  Drug Use No    Social History   Socioeconomic History  . Marital status: Single    Spouse name: Not on file  . Number of children: Not on file  . Years of education: Not on file  . Highest education level: Not on file  Occupational History  . Not on file  Tobacco Use  . Smoking status: Current Every Day  Smoker    Packs/day: 0.50    Years: 20.00    Pack years: 10.00    Types: Cigarettes  . Smokeless tobacco: Never Used  Substance and Sexual Activity  . Alcohol use: Yes    Comment: 2-3 week.    . Drug use: No  . Sexual activity: Not on file  Other Topics Concern  . Not on file  Social History Narrative  . Not on file   Social Determinants of Health   Financial Resource Strain:   . Difficulty of Paying Living Expenses:   Food Insecurity:   . Worried About Charity fundraiser in the Last Year:   . Arboriculturist in the Last Year:   Transportation Needs:   . Film/video editor (Medical):   Marland Kitchen Lack of Transportation (Non-Medical):   Physical Activity:   . Days of Exercise per Week:   . Minutes of Exercise per Session:   Stress:   . Feeling of Stress :   Social Connections:   . Frequency of Communication with Friends and Family:   . Frequency of Social Gatherings with Friends and Family:   . Attends Religious Services:   . Active Member of Clubs or Organizations:   . Attends Archivist Meetings:   Marland Kitchen Marital Status:    Additional Social History:    Allergies:  No Known Allergies  Labs:  Results for orders placed or performed during the hospital encounter of 12/24/19 (from the past 48 hour(s))  Comprehensive metabolic panel     Status: Abnormal   Collection Time: 12/24/19  2:25 AM  Result Value Ref Range   Sodium 143 135 - 145 mmol/L   Potassium 2.9 (L) 3.5 - 5.1 mmol/L   Chloride 104 98 - 111 mmol/L   CO2 27 22 - 32 mmol/L   Glucose, Bld 135 (H) 70 - 99 mg/dL    Comment: Glucose reference range applies only to samples taken after fasting for at least 8 hours.   BUN <5 (L) 6 - 20 mg/dL   Creatinine, Ser 0.92 0.44 - 1.00 mg/dL   Calcium 8.7 (L) 8.9 - 10.3 mg/dL   Total Protein 7.9 6.5 - 8.1 g/dL   Albumin 3.9 3.5 - 5.0 g/dL   AST 18 15 - 41 U/L   ALT 12 0 - 44 U/L   Alkaline Phosphatase 57 38 - 126 U/L   Total Bilirubin 0.6 0.3 - 1.2 mg/dL   GFR  calc non Af Amer >60 >60 mL/min   GFR calc Af Amer >60 >60 mL/min   Anion gap 12 5 - 15    Comment: Performed at [facility], [address] [place], [place] [address]  Ethanol     Status: Abnormal   Collection Time: 12/24/19  2:25 AM  Result Value Ref Range   Alcohol, Ethyl (B) 270 (H) <10 mg/dL    Comment: (NOTE) Lowest detectable limit for serum alcohol is 10 mg/dL. For medical purposes only. Performed at [facility], [address] [place],  [place] [id number]   Salicylate level     Status: Abnormal   Collection Time: 12/24/19  2:25 AM  Result Value Ref Range   Salicylate Lvl [id number] (L) 7.0 - 30.0 mg/dL    Comment: Performed at [facility], [address] [place], [place] [address]  Acetaminophen level     Status: None   Collection Time: 12/24/19  2:25 AM  Result Value Ref Range   Acetaminophen (Tylenol), Serum 18 10 - 30 ug/mL    Comment: (NOTE) Therapeutic concentrations vary significantly. A range of 10-30 ug/mL  may be an effective concentration for many patients. However, some  are best treated at concentrations outside of this range. Acetaminophen concentrations >150 ug/mL at 4 hours after ingestion  and >50 ug/mL at 12 hours after ingestion are often associated with  toxic reactions. Performed at [facility], [address] [place], [place] [address]   cbc     Status: Abnormal   Collection Time: 12/24/19  2:25 AM  Result Value Ref Range   WBC 6.4 4.0 - 10.5 K/uL   RBC 3.88 3.87 - 5.11 MIL/uL   Hemoglobin 8.7 (L) 12.0 - 15.0 g/dL   HCT 31.0 (L) 36.0 - 46.0 %   MCV 79.9 (L) 80.0 - 100.0 fL   MCH 22.4 (L) 26.0 - 34.0 pg   MCHC 28.1 (L) 30.0 - 36.0 g/dL   RDW 20.7 (H) 11.5 - 15.5 %   Platelets 179 150 - 400 K/uL   nRBC 0.0 0.0 - 0.2 %    Comment: Performed at [facility], [address] [place], [place] [address]  Lipase, blood     Status: Abnormal   Collection Time: 12/24/19  2:25 AM  Result Value Ref  Range   Lipase 55 (H) 11 - 51 U/L    Comment: Performed at [facility], [address] [place], [place] [address]  Pregnancy, urine POC     Status: None   Collection Time: 12/24/19 11:03 AM  Result Value Ref Range   Preg Test, Ur NEGATIVE NEGATIVE    Comment:        THE SENSITIVITY OF THIS METHODOLOGY IS >24 mIU/mL     No current facility-administered medications for this encounter.   Current Outpatient Medications  Medication Sig Dispense Refill  . acetaminophen (TYLENOL) 325 MG tablet Take 650 mg by mouth every 6 (six) hours as needed for mild pain or fever.     Marland Kitchen albuterol (PROVENTIL HFA;VENTOLIN HFA) 108 (90 Base) MCG/ACT inhaler Inhale 2 puffs into the lungs every 4 (four) hours as needed for wheezing or shortness of breath. 1 Inhaler 0  . ketorolac (TORADOL) 10 MG tablet Take 1 tablet (10 mg total) by mouth every 6 (six) hours as needed. (Patient not taking: Reported on 09/01/2019) 20 tablet 0  . Oxycodone HCl 10 MG TABS Take 1 tablet (10 mg total) by mouth every 6 (six) hours as needed. (Patient not taking: Reported on 09/01/2019) 15 tablet 0  . rivaroxaban (XARELTO) 20 MG TABS tablet Take 1 tablet (20 mg total) by mouth daily with supper. 30 tablet 5  . Rivaroxaban 15 & 20 MG TBPK Follow package directions: Take one 15mg  tablet by mouth twice a day. On day 22, switch to one 20mg  tablet once a day. Take with food. (Patient not taking: Reported on 09/01/2019) 51 each 0    Musculoskeletal: Strength & Muscle Tone: within normal limits Gait & Station: normal Patient leans: Patient remained in bed  during evaluation  Psychiatric Specialty Exam: Physical Exam  Nursing note and vitals reviewed. Constitutional: She is oriented to person, place, and time. She appears well-developed and well-nourished.  Cardiovascular: Normal rate.  Respiratory: Effort normal.  Musculoskeletal:        General: Normal range of motion.  Neurological: She is alert and oriented to person,  place, and time.  Skin: Skin is warm.  Psychiatric: She exhibits a depressed mood. She expresses suicidal ideation.    Review of Systems  Constitutional: Negative.   HENT: Negative.   Eyes: Negative.   Respiratory: Negative.   Cardiovascular: Positive for chest pain.  Gastrointestinal: Negative.   Genitourinary: Negative.   Musculoskeletal: Negative.   Skin: Negative.   Neurological: Negative.   Psychiatric/Behavioral: Positive for dysphoric mood and suicidal ideas.    Blood pressure 115/63, pulse 73, temperature 98.7 F (37.1 C), temperature source Axillary, resp. rate 18, height 5\' 9"  (1.753 m), weight 101 kg, SpO2 94 %.Body mass index is 32.88 kg/m.  General Appearance: Disheveled  Eye Contact:  Minimal  Speech:  Clear and Coherent and Normal Rate  Volume:  Decreased  Mood:  Depressed and Dysphoric  Affect:  Depressed  Thought Process:  Linear and Descriptions of Associations: Intact  Orientation:  Full (Time, Place, and Person)  Thought Content:  WDL  Suicidal Thoughts:  Yes.  with intent/plan  Homicidal Thoughts:  No  Memory:  Immediate;   Fair Recent;   Fair Remote;   Fair  Judgement:  Fair  Insight:  Fair  Psychomotor Activity:  Normal  Concentration:  Concentration: Fair  Recall:  [employer] of Knowledge:  Fair  Language:  Fair  Akathisia:  No  Handed:  Right  AIMS (if indicated):     Assets:  Housing Social Support  ADL's:  Intact  Cognition:  WNL  Sleep:        Treatment Plan Summary: Daily contact with patient to assess and evaluate symptoms and progress in treatment and Medication management  RN and EDP notified of chest pain and requested replacement potassium since potassium is at 2.9  Disposition: Recommend psychiatric Inpatient admission when medically cleared.  [place], FNP 12/24/2019 11:13 AM

## 2019-12-24 NOTE — ED Notes (Signed)
Pt co chest pain. Dr Archie Balboa made aware and new order received.

## 2019-12-25 MED ORDER — NICOTINE 14 MG/24HR TD PT24
14.0000 mg | MEDICATED_PATCH | Freq: Every day | TRANSDERMAL | Status: DC
  Administered 2019-12-25: 14 mg via TRANSDERMAL
  Filled 2019-12-25: qty 1

## 2019-12-25 MED ORDER — THIAMINE HCL 100 MG PO TABS
100.0000 mg | ORAL_TABLET | Freq: Every day | ORAL | Status: DC
  Administered 2019-12-25 – 2019-12-28 (×4): 100 mg via ORAL
  Filled 2019-12-25 (×4): qty 1

## 2019-12-25 MED ORDER — LORAZEPAM 2 MG/ML IJ SOLN: 0.0000 mg | Freq: Two times a day (BID) | INTRAMUSCULAR | Status: DC

## 2019-12-25 MED ORDER — GABAPENTIN 300 MG PO CAPS
300.0000 mg | ORAL_CAPSULE | Freq: Three times a day (TID) | ORAL | Status: DC
  Administered 2019-12-25 – 2019-12-28 (×10): 300 mg via ORAL
  Filled 2019-12-25 (×10): qty 1

## 2019-12-25 MED ORDER — RIVAROXABAN 20 MG PO TABS
20.0000 mg | ORAL_TABLET | Freq: Every day | ORAL | Status: DC
  Administered 2019-12-25 – 2019-12-27 (×3): 20 mg via ORAL
  Filled 2019-12-25 (×6): qty 1

## 2019-12-25 MED ORDER — THIAMINE HCL 100 MG/ML IJ SOLN: 100.0000 mg | Freq: Every day | INTRAMUSCULAR | Status: DC

## 2019-12-25 MED ORDER — LORAZEPAM 2 MG PO TABS
0.0000 mg | ORAL_TABLET | Freq: Four times a day (QID) | ORAL | Status: AC
  Administered 2019-12-25: 1 mg via ORAL
  Administered 2019-12-25 – 2019-12-27 (×3): 2 mg via ORAL
  Administered 2019-12-27: 1 mg via ORAL
  Filled 2019-12-25 (×5): qty 1

## 2019-12-25 MED ORDER — LORAZEPAM 2 MG PO TABS
0.0000 mg | ORAL_TABLET | Freq: Two times a day (BID) | ORAL | Status: DC
  Administered 2019-12-28: 1 mg via ORAL
  Administered 2019-12-28: 2 mg via ORAL
  Filled 2019-12-25 (×3): qty 1

## 2019-12-25 MED ORDER — ONDANSETRON 4 MG PO TBDP
4.0000 mg | ORAL_TABLET | Freq: Three times a day (TID) | ORAL | Status: DC | PRN
  Administered 2019-12-25 – 2019-12-26 (×2): 4 mg via ORAL
  Filled 2019-12-25 (×2): qty 1

## 2019-12-25 MED ORDER — FLUOXETINE HCL 20 MG PO CAPS
20.0000 mg | ORAL_CAPSULE | Freq: Every day | ORAL | Status: DC
  Administered 2019-12-25 – 2019-12-28 (×4): 20 mg via ORAL
  Filled 2019-12-25 (×4): qty 1

## 2019-12-25 MED ORDER — NICOTINE 21 MG/24HR TD PT24
21.0000 mg | MEDICATED_PATCH | Freq: Once | TRANSDERMAL | Status: DC
  Administered 2019-12-25: 21 mg via TRANSDERMAL
  Filled 2019-12-25: qty 1

## 2019-12-25 MED ORDER — ALBUTEROL SULFATE HFA 108 (90 BASE) MCG/ACT IN AERS
2.0000 | INHALATION_SPRAY | RESPIRATORY_TRACT | Status: DC | PRN
  Administered 2019-12-25 – 2019-12-28 (×8): 2 via RESPIRATORY_TRACT
  Filled 2019-12-25 (×3): qty 6.7

## 2019-12-25 MED ORDER — IBUPROFEN 800 MG PO TABS
800.0000 mg | ORAL_TABLET | Freq: Four times a day (QID) | ORAL | Status: DC | PRN
  Administered 2019-12-25 – 2019-12-27 (×6): 800 mg via ORAL
  Filled 2019-12-25 (×6): qty 1

## 2019-12-25 MED ORDER — LORAZEPAM 2 MG/ML IJ SOLN: 0.0000 mg | Freq: Four times a day (QID) | INTRAMUSCULAR | Status: AC

## 2019-12-25 NOTE — BH Assessment (Signed)
TTS referral status update for [facility]: Helyn App reported denied due to medical

## 2019-12-25 NOTE — BH Assessment (Signed)
TTS check-in on referral status:    [facility] [phone]) Verdis Frederickson reported to have no adult beds today and to follow up tomorrow    [facility] [phone] or [phone]) Yvetta Coder reported not accepting outside referrals     [facility] [phone]) Reported to be at capacity follow up tomorrow    [facility] [phone]) 3rd voicemail left on 12/25/19 at 2:45pm

## 2019-12-25 NOTE — ED Notes (Signed)
Pt. Requested and was given chocolate milk.  Pt. Asked about being admitted to lower level behavior unit.  Pt. Told it would probably be next shift when she would be going downstairs.

## 2019-12-25 NOTE — ED Notes (Signed)
Hourly rounding reveals patient awake in room. Stable, in no acute distress. Q15 minute rounds and monitoring via [employer] to continue.

## 2019-12-25 NOTE — ED Notes (Signed)
Hourly rounding reveals patient in day room. No complaints, stable, in no acute distress. Q15 minute rounds and monitoring via Security Cam[employer] to continue. 

## 2019-12-25 NOTE — ED Notes (Signed)
Meal tray provided.

## 2019-12-25 NOTE — ED Notes (Signed)
Pt. Introduced to unit.  Pt. Advised of cameras, safety checks, and bathroom use.  Assisted patient in getting comfortable in bed.  Pt. Given additional pillows for comfort.  Pt. Also given cup of water.

## 2019-12-25 NOTE — ED Notes (Signed)
Pt informed this RN she drinks daily. Pt states she is nauseous and shaky.  EDP made aware.

## 2019-12-25 NOTE — ED Notes (Signed)
Hourly rounding reveals patient in day room. Stable, in no acute distress. Q15 minute rounds and monitoring via [employer] to continue.

## 2019-12-25 NOTE — ED Notes (Signed)
Hourly rounding reveals patient in room. Stable, in no acute distress. Q15 minute rounds and monitoring via Security Cam[employer] to continue. 

## 2019-12-25 NOTE — ED Notes (Signed)
Pt. Up using bathroom.  Pt. Checked in at nursing station to request nicotine patch.

## 2019-12-25 NOTE — BH Assessment (Addendum)
Referral information for Psychiatric Hospitalization faxed to;    [facility] [phone]- [phone]), Currently no bed availability    [facility] [phone] or [phone]) Currently no bed availability   [facility] [phone]), No current discharges today 12/25/19   [facility] [phone] -or- [phone]), Denied due to medical   [facility] [phone]). No current intake staff, voicemail was left on 12/25/19

## 2019-12-25 NOTE — ED Provider Notes (Signed)
Emergency Medicine Observation Re-evaluation Note  Katrina Page is a 39 y.o. female, seen on rounds today.  Pt initially presented to the ED for complaints of No chief complaint on file. Currently, the patient is awaiting psychiatric disposition.  Physical Exam  BP (!) 157/81 (BP Location: Right Arm)   Pulse 74   Temp 97.6 F (36.4 C) (Oral)   Resp 18   Ht 5\' 9"  (1.753 m)   Wt 101 kg   SpO2 99%   BMI 32.88 kg/m  Physical Exam  ED Course / MDM  EKG:    I have reviewed the labs performed to date as well as medications administered while in observation.  Recent changes in the last 24 hours include evaluated by psychiatry and plan for psychiatric admission. Plan  Current plan is for inpatient psychiatric care. Patient is under full IVC at this time.   Carrie Mew, MD 12/25/19 [phone]

## 2019-12-25 NOTE — ED Notes (Signed)
Pt given extra blanket

## 2019-12-25 NOTE — ED Notes (Signed)
Report to include Situation, Background, Assessment, and Recommendations received from Amy B. RN. Patient alert and oriented, warm and dry, in no acute distress. Patient denies SI, HI, AVH and pain. Patient made aware of Q15 minute rounds and security cameras for their safety. Patient instructed to come to me with needs or concerns. 

## 2019-12-25 NOTE — ED Notes (Signed)
Pt given dinner tray.

## 2019-12-25 NOTE — Consult Note (Signed)
[facility] Consult   Reason for Consult:  Overdose with intent to kill self Referring Physician:  EDP Patient Identification: Katrina Page MRN:  [id number] Principal Diagnosis: MDD (major depressive disorder), severe ([place]) Diagnosis:  Principal Problem:   MDD (major depressive disorder), severe ([place]) Active Problems:   Alcohol abuse   Total Time spent with patient: 30 minutes  Subjective:   Katrina Page is a 39 y.o. female patient admitted with a suicide attempt by overdose. Patient cannot be admitted to [facility] due to her brother being on the unit.  Patient was seen and evaluated in person by this provider after suicide attempt by overdose after drinking alcohol.  She continues to endorse suicidal ideations, feelings of hopelessness, helplessness, and worthlessness.  Anxiety is high.  Patient denies hallucinations, withdrawal symptoms, and homicidal ideations. No signs or symptoms of mania or paranoia.  Patient complained of external chest pain "from my son jumping on me."  HPI:  Per EDP: 39 y.o. female with a history of migraines, pulmonary embolism, kidney stones who comes to the ED due to intoxication and suspected overdose.  Her friend called EMS due to patient taking a lot of pills.  Reportedly EMS was transporting the patient but she was combative, smoking in the ambulance, refusing to wear seatbelt, so the ambulance crew was unable to transport her.  Police came and offered her choice of going to the hospital for evaluation or being taken to jail, and she chose hospital.   Patient is evasive and uncooperative with questioning.  Denies SI or HI.  States that she just wants to go home so she can sleep and go to work tomorrow.  She does endorse drinking.  When asked about taking any pills tonight, she states that everyone who says that is lying.  Patient has been seen by this provider via face-to-face.  Admitted to overdosing on medication with an attempt to kill  herself.  Patient does appear to be depressed and anxious and feels that patient needs to be admitted for psychiatric treatment.  Patient is already under IVC and IVC will remain at this time.  TTS we'll seek placement for this patient.  Past Psychiatric History: None reported  Risk to Self: Suicidal Ideation: No-Not Currently/Within Last 6 Months Suicidal Intent: No-Not Currently/Within Last 6 Months Is patient at risk for suicide?: No Suicidal Plan?: No-Not Currently/Within Last 6 Months Access to Means: Yes Specify Access to Suicidal Means: Patient had access to pills What has been your use of drugs/alcohol within the last 12 months?: Alcohol use How many times?: 0 Triggers for Past Attempts: None known Intentional Self Injurious Behavior: None Risk to Others: Homicidal Ideation: No Thoughts of Harm to Others: No Current Homicidal Intent: No Current Homicidal Plan: No Access to Homicidal Means: No History of harm to others?: No Assessment of Violence: None Noted Does patient have access to weapons?: No Criminal Charges Pending?: Yes Describe Pending Criminal Charges: Patient would not report Does patient have a court date: (Unknown) Prior Inpatient Therapy: Prior Inpatient Therapy: (Unknown) Prior Outpatient Therapy: Prior Outpatient Therapy: (Unknown)  Past Medical History:  Past Medical History:  Diagnosis Date  . Migraines     Past Surgical History:  Procedure Laterality Date  . CHOLECYSTECTOMY    . TUBAL LIGATION     Family History:  Family History  Problem Relation Age of Onset  . Hypertension Mother   . Diabetes Mother   . Stroke Mother   . Breast cancer Mother  Family Psychiatric  History: None reported Social History:  Social History   Substance and Sexual Activity  Alcohol Use Yes   Comment: 2-3 week.       Social History   Substance and Sexual Activity  Drug Use No    Social History   Socioeconomic History  . Marital status: Single     Spouse name: Not on file  . Number of children: Not on file  . Years of education: Not on file  . Highest education level: Not on file  Occupational History  . Not on file  Tobacco Use  . Smoking status: Current Every Day Smoker    Packs/day: 0.50    Years: 20.00    Pack years: 10.00    Types: Cigarettes  . Smokeless tobacco: Never Used  Substance and Sexual Activity  . Alcohol use: Yes    Comment: 2-3 week.    . Drug use: No  . Sexual activity: Not on file  Other Topics Concern  . Not on file  Social History Narrative  . Not on file   Social Determinants of Health   Financial Resource Strain:   . Difficulty of Paying Living Expenses:   Food Insecurity:   . Worried About Charity fundraiser in the Last Year:   . Arboriculturist in the Last Year:   Transportation Needs:   . Film/video editor (Medical):   Marland Kitchen Lack of Transportation (Non-Medical):   Physical Activity:   . Days of Exercise per Week:   . Minutes of Exercise per Session:   Stress:   . Feeling of Stress :   Social Connections:   . Frequency of Communication with Friends and Family:   . Frequency of Social Gatherings with Friends and Family:   . Attends Religious Services:   . Active Member of Clubs or Organizations:   . Attends Archivist Meetings:   Marland Kitchen Marital Status:    Additional Social History:    Allergies:  No Known Allergies  Labs:  Results for orders placed or performed during the hospital encounter of 12/24/19 (from the past 48 hour(s))  Comprehensive metabolic panel     Status: Abnormal   Collection Time: 12/24/19  2:25 AM  Result Value Ref Range   Sodium 143 135 - 145 mmol/L   Potassium 2.9 (L) 3.5 - 5.1 mmol/L   Chloride 104 98 - 111 mmol/L   CO2 27 22 - 32 mmol/L   Glucose, Bld 135 (H) 70 - 99 mg/dL    Comment: Glucose reference range applies only to samples taken after fasting for at least 8 hours.   BUN <5 (L) 6 - 20 mg/dL   Creatinine, Ser 0.92 0.44 - 1.00 mg/dL    Calcium 8.7 (L) 8.9 - 10.3 mg/dL   Total Protein 7.9 6.5 - 8.1 g/dL   Albumin 3.9 3.5 - 5.0 g/dL   AST 18 15 - 41 U/L   ALT 12 0 - 44 U/L   Alkaline Phosphatase 57 38 - 126 U/L   Total Bilirubin 0.6 0.3 - 1.2 mg/dL   GFR calc non Af Amer >60 >60 mL/min   GFR calc Af Amer >60 >60 mL/min   Anion gap 12 5 - 15    Comment: Performed at [facility], [address], [place], [place] [address]  Ethanol     Status: Abnormal   Collection Time: 12/24/19  2:25 AM  Result Value Ref Range   Alcohol, Ethyl (B)  270 (H) <10 mg/dL    Comment: (NOTE) Lowest detectable limit for serum alcohol is 10 mg/dL. For medical purposes only. Performed at [facility], [address] [place], [place] [id number]   Salicylate level     Status: Abnormal   Collection Time: 12/24/19  2:25 AM  Result Value Ref Range   Salicylate Lvl [id number] (L) 7.0 - 30.0 mg/dL    Comment: Performed at [facility], [address] [place], [place] [address]  Acetaminophen level     Status: None   Collection Time: 12/24/19  2:25 AM  Result Value Ref Range   Acetaminophen (Tylenol), Serum 18 10 - 30 ug/mL    Comment: (NOTE) Therapeutic concentrations vary significantly. A range of 10-30 ug/mL  may be an effective concentration for many patients. However, some  are best treated at concentrations outside of this range. Acetaminophen concentrations >150 ug/mL at 4 hours after ingestion  and >50 ug/mL at 12 hours after ingestion are often associated with  toxic reactions. Performed at [facility], [address] [place], [place] [address]   cbc     Status: Abnormal   Collection Time: 12/24/19  2:25 AM  Result Value Ref Range   WBC 6.4 4.0 - 10.5 K/uL   RBC 3.88 3.87 - 5.11 MIL/uL   Hemoglobin 8.7 (L) 12.0 - 15.0 g/dL   HCT 31.0 (L) 36.0 - 46.0 %   MCV 79.9 (L) 80.0 - 100.0 fL   MCH 22.4 (L) 26.0 - 34.0 pg   MCHC 28.1 (L) 30.0 - 36.0 g/dL   RDW 20.7 (H) 11.5 - 15.5 %   Platelets 179  150 - 400 K/uL   nRBC 0.0 0.0 - 0.2 %    Comment: Performed at [facility], [address] [place], [place] [address]  Lipase, blood     Status: Abnormal   Collection Time: 12/24/19  2:25 AM  Result Value Ref Range   Lipase 55 (H) 11 - 51 U/L    Comment: Performed at [facility] [place], [place] [address]  Troponin I (High Sensitivity)     Status: None   Collection Time: 12/24/19  2:25 AM  Result Value Ref Range   Troponin I (High Sensitivity) 4 <18 ng/L    Comment: (NOTE) Elevated high sensitivity troponin I (hsTnI) values and significant  changes across serial measurements may suggest ACS but many other  chronic and acute conditions are known to elevate hsTnI results.  Refer to the "Links" section for chest pain algorithms and additional  guidance. Performed at [facility], [address] [place], [place] [address]   Urine Drug Screen, Qualitative     Status: Abnormal   Collection Time: 12/24/19 10:58 AM  Result Value Ref Range   Tricyclic, Ur Screen NONE DETECTED NONE DETECTED   Amphetamines, Ur Screen NONE DETECTED NONE DETECTED   MDMA (Ecstasy)Ur Screen NONE DETECTED NONE DETECTED   Cocaine Metabolite,Ur [place] NONE DETECTED NONE DETECTED   Opiate, Ur Screen NONE DETECTED NONE DETECTED   Phencyclidine (PCP) Ur S NONE DETECTED NONE DETECTED   Cannabinoid 50 Ng, Ur [address] POSITIVE (A) NONE DETECTED   Barbiturates, Ur Screen NONE DETECTED NONE DETECTED   Benzodiazepine, Ur Scrn NONE DETECTED NONE DETECTED   Methadone Scn, Ur NONE DETECTED NONE DETECTED    Comment: (NOTE) Tricyclics + metabolites, urine    Cutoff 1000 ng/mL Amphetamines + metabolites, urine  Cutoff 1000 ng/mL MDMA (Ecstasy), urine  Cutoff 500 ng/mL Cocaine Metabolite, urine          Cutoff 300 ng/mL Opiate + metabolites, urine        Cutoff 300 ng/mL Phencyclidine (PCP), urine         Cutoff 25 ng/mL Cannabinoid, urine                 Cutoff 50  ng/mL Barbiturates + metabolites, urine  Cutoff 200 ng/mL Benzodiazepine, urine              Cutoff 200 ng/mL Methadone, urine                   Cutoff 300 ng/mL The urine drug screen provides only a preliminary, unconfirmed analytical test result and should not be used for non-medical purposes. Clinical consideration and professional judgment should be applied to any positive drug screen result due to possible interfering substances. A more specific alternate chemical method must be used in order to obtain a confirmed analytical result. Gas chromatography / mass spectrometry (GC/MS) is the preferred confirmat ory method. Performed at [facility], [address] [place], [place] [address]   Pregnancy, urine POC     Status: None   Collection Time: 12/24/19 11:03 AM  Result Value Ref Range   Preg Test, Ur NEGATIVE NEGATIVE    Comment:        THE SENSITIVITY OF THIS METHODOLOGY IS >24 mIU/mL   Troponin I (High Sensitivity)     Status: None   Collection Time: 12/24/19 11:31 AM  Result Value Ref Range   Troponin I (High Sensitivity) 5 <18 ng/L    Comment: (NOTE) Elevated high sensitivity troponin I (hsTnI) values and significant  changes across serial measurements may suggest ACS but many other  chronic and acute conditions are known to elevate hsTnI results.  Refer to the "Links" section for chest pain algorithms and additional  guidance. Performed at [facility], [address] [place], [place] [address]   Respiratory Panel by RT PCR (Flu A&B, Covid) - Nasopharyngeal Swab     Status: None   Collection Time: 12/24/19 11:31 AM   Specimen: Nasopharyngeal Swab  Result Value Ref Range   SARS Coronavirus 2 by RT PCR NEGATIVE NEGATIVE    Comment: (NOTE) SARS-CoV-2 target nucleic acids are NOT DETECTED. The SARS-CoV-2 RNA is generally detectable in upper respiratoy specimens during the acute phase of infection. The lowest concentration of SARS-CoV-2 viral  copies this assay can detect is 131 copies/mL. A negative result does not preclude SARS-Cov-2 infection and should not be used as the sole basis for treatment or other patient management decisions. A negative result may occur with  improper specimen collection/handling, submission of specimen other than nasopharyngeal swab, presence of viral mutation(s) within the areas targeted by this assay, and inadequate number of viral copies (<131 copies/mL). A negative result must be combined with clinical observations, patient history, and epidemiological information. The expected result is Negative. Fact Sheet for Patients:  PinkCheek.be Fact Sheet for Healthcare Providers:  GravelBags.it This test is not yet ap proved or cleared by the [place] FDA and  has been authorized for detection and/or diagnosis of SARS-CoV-2 by FDA under an Emergency Use Authorization (EUA). This EUA will remain  in effect (meaning this test can be used) for the duration of the COVID-19 declaration under Section 564(b)(1) of the Act, 21 U.S.C. section 360bbb-3(b)(1), unless the authorization is terminated or revoked sooner.    Influenza A by PCR  NEGATIVE NEGATIVE   Influenza B by PCR NEGATIVE NEGATIVE    Comment: (NOTE) The Xpert Xpress SARS-CoV-2/FLU/RSV assay is intended as an aid in  the diagnosis of influenza from Nasopharyngeal swab specimens and  should not be used as a sole basis for treatment. Nasal washings and  aspirates are unacceptable for Xpert Xpress SARS-CoV-2/FLU/RSV  testing. Fact Sheet for Patients: PinkCheek.be Fact Sheet for Healthcare Providers: GravelBags.it This test is not yet approved or cleared by the [place] FDA and  has been authorized for detection and/or diagnosis of SARS-CoV-2 by  FDA under an Emergency Use Authorization (EUA). This EUA will remain  in  effect (meaning this test can be used) for the duration of the  Covid-19 declaration under Section 564(b)(1) of the Act, 21  U.S.C. section 360bbb-3(b)(1), unless the authorization is  terminated or revoked. Performed at [facility], [address], [place], [place] [address]     Current Facility-Administered Medications  Medication Dose Route Frequency Provider Last Rate Last Admin  . acetaminophen (TYLENOL) tablet 650 mg  650 mg Oral Q6H PRN Nance Pear, MD   650 mg at 12/25/19 0428  . albuterol (VENTOLIN HFA) 108 (90 Base) MCG/ACT inhaler 2 puff  2 puff Inhalation Q4H PRN Carrie Mew, MD      . nicotine (NICODERM CQ - dosed in mg/24 hours) patch 14 mg  14 mg Transdermal Q0200 Carrie Mew, MD   14 mg at 12/25/19 0037  . rivaroxaban (XARELTO) tablet 20 mg  20 mg Oral Q supper Carrie Mew, MD       Current Outpatient Medications  Medication Sig Dispense Refill  . acetaminophen (TYLENOL) 325 MG tablet Take 650 mg by mouth every 6 (six) hours as needed for mild pain or fever.     Marland Kitchen albuterol (PROVENTIL HFA;VENTOLIN HFA) 108 (90 Base) MCG/ACT inhaler Inhale 2 puffs into the lungs every 4 (four) hours as needed for wheezing or shortness of breath. 1 Inhaler 0  . rivaroxaban (XARELTO) 20 MG TABS tablet Take 1 tablet (20 mg total) by mouth daily with supper. 30 tablet 5    Musculoskeletal: Strength & Muscle Tone: within normal limits Gait & Station: normal Patient leans: Patient remained in bed during evaluation  Psychiatric Specialty Exam: Physical Exam  Nursing note and vitals reviewed. Constitutional: She is oriented to person, place, and time. She appears well-developed and well-nourished.  Cardiovascular: Normal rate.  Respiratory: Effort normal.  Musculoskeletal:        General: Normal range of motion.  Neurological: She is alert and oriented to person, place, and time.  Skin: Skin is warm.  Psychiatric: Her speech is normal. Her mood appears  anxious. She is slowed. Cognition and memory are normal. She expresses impulsivity. She exhibits a depressed mood. She expresses suicidal ideation. She expresses suicidal plans.    Review of Systems  Constitutional: Negative.   HENT: Negative.   Eyes: Negative.   Respiratory: Negative.   Cardiovascular: Positive for chest pain.  Gastrointestinal: Negative.   Genitourinary: Negative.   Musculoskeletal: Negative.   Skin: Negative.   Neurological: Negative.   Psychiatric/Behavioral: Positive for dysphoric mood and suicidal ideas. The patient is nervous/anxious.   All other systems reviewed and are negative.   Blood pressure (!) 157/81, pulse 74, temperature 97.6 F (36.4 C), temperature source Oral, resp. rate 18, height 5\' 9"  (1.753 m), weight 101 kg, SpO2 99 %.Body mass index is 32.88 kg/m.  General Appearance: Disheveled  Eye Contact:  Minimal  Speech:  Clear and Coherent and Normal Rate  Volume:  Decreased  Mood:  Depressed and Dysphoric  Affect:  Depressed  Thought Process:  Linear and Descriptions of Associations: Intact  Orientation:  Full (Time, Place, and Person)  Thought Content:  WDL  Suicidal Thoughts:  Yes.  with intent/plan  Homicidal Thoughts:  No  Memory:  Immediate;   Fair Recent;   Fair Remote;   Fair  Judgement:  Fair  Insight:  Fair  Psychomotor Activity:  Normal  Concentration:  Concentration: Fair  Recall:  [employer] of Knowledge:  Fair  Language:  Fair  Akathisia:  No  Handed:  Right  AIMS (if indicated):     Assets:  Housing Social Support  ADL's:  Intact  Cognition:  WNL  Sleep:      39 yo female admitted after intentionally overdosing in a suicide attempt while drinking.  Continues to endorse suicidal ideations with intent, no homicidal ideations or hallucinations.  Treatment Plan Summary: Daily contact with patient to assess and evaluate symptoms and progress in treatment and Medication management  Major depressive disorder, recurrent,  severe without psychosis: -Started Prozac 20 mg daily   Alcohol abuse: -Ativan alcohol detox protocol in place  Disposition: Recommend psychiatric Inpatient admission when medically cleared.  Waylan Boga, NP 12/25/2019 5:58 AM

## 2019-12-25 NOTE — BH Assessment (Signed)
Patient unable to be admitted to [facility] because she have an immediate family on the unit.

## 2019-12-25 NOTE — ED Notes (Signed)
Pt complaining of 10/10 pain in back and neck.   EDP made aware.

## 2019-12-25 NOTE — ED Notes (Signed)
Meal tray placed in chair in room.

## 2019-12-25 NOTE — BH Assessment (Signed)
Referral information for Psychiatric Hospitalization faxed to;   [facility] [phone]), Janett Billow denied due to coverage    [facility] [phone] or [phone]) Currently no bed available, call back to check during lunch time    [facility] [phone]), Remo Lipps reported to be at capacity follow up [place] [phone] -[phone]), Denied due to [place] [phone]). 2nd voicemail was left on 12/25/19 10:12am

## 2019-12-26 MED ORDER — LIDOCAINE 5 % EX PTCH
1.0000 | MEDICATED_PATCH | CUTANEOUS | Status: DC
  Administered 2019-12-26 – 2019-12-28 (×4): 1 via TRANSDERMAL
  Filled 2019-12-26 (×5): qty 1

## 2019-12-26 MED ORDER — NICOTINE 21 MG/24HR TD PT24
21.0000 mg | MEDICATED_PATCH | Freq: Every day | TRANSDERMAL | Status: DC
  Administered 2019-12-26 – 2019-12-28 (×3): 21 mg via TRANSDERMAL
  Filled 2019-12-26 (×3): qty 1

## 2019-12-26 MED ORDER — TRAMADOL HCL 50 MG PO TABS
50.0000 mg | ORAL_TABLET | Freq: Once | ORAL | Status: AC
  Administered 2019-12-26: 50 mg via ORAL
  Filled 2019-12-26: qty 1

## 2019-12-26 MED ORDER — TRAZODONE HCL 50 MG PO TABS
150.0000 mg | ORAL_TABLET | Freq: Every day | ORAL | Status: DC
  Administered 2019-12-26 – 2019-12-27 (×2): 150 mg via ORAL
  Filled 2019-12-26 (×2): qty 1

## 2019-12-26 NOTE — Progress Notes (Signed)
Patient meets inpatient per Waylan Boga, NP. Patient has been faxed out to the following facilities for review:   [facility]  [place] [facility]  [place]  [facility] [place]  CSW will continue to follow and assist with disposition planning.   Domenic Schwab, MSW, LCSW-A Clinical Disposition Social Worker [facility]/TTS [phone]

## 2019-12-26 NOTE — ED Notes (Signed)
Hourly rounding reveals patient sleeping in room. No complaints, stable, in no acute distress. Q15 minute rounds and monitoring via Security Cameras to continue. 

## 2019-12-26 NOTE — ED Notes (Signed)
After patient was in rest room several minutes longer than we thought normal Tech knocked on door and then checked on the patient who had been engaged in what appears to be attention seeking behavior by scratching at her left wrist with a piece of expanded alluminum that appears to be part of a previous repair to the wall where the door knob damaged the wall board.Flow coordinator notified and patient transferred to [facility] for closer observation and 1:1.

## 2019-12-26 NOTE — ED Provider Notes (Addendum)
11:00 PM.  I was notified by nursing staff that the patient while in the [place] stuck her finger in the wall and remove piece of plastic which he used to cut her left wrist.  Superficial abrasion noted to the patient's wrist as depicted below:    Marland KitchenMarland KitchenLaceration Repair  Date/Time: 12/26/2019 11:07 PM Performed by: Gregor Hams, MD Authorized by: Gregor Hams, MD   Consent:    Consent obtained:  Verbal   Consent given by:  Patient   Risks discussed:  Infection, pain, retained foreign body, poor cosmetic result and poor wound healing Anesthesia (see MAR for exact dosages):    Anesthesia method:  Local infiltration   Local anesthetic:  Lidocaine 1% w/o epi Laceration details:    Location:  Hand   Hand location:  L wrist   Length (cm):  3 Repair type:    Repair type:  Simple Exploration:    Hemostasis achieved with:  Direct pressure   Wound exploration: entire depth of wound probed and visualized     Contaminated: no   Treatment:    Area cleansed with:  Saline   Amount of cleaning:  Extensive   Irrigation solution:  Sterile saline   Visualized foreign bodies/material removed: no   Skin repair:    Repair method:  Tissue adhesive Approximation:    Approximation:  Close Post-procedure details:    Dressing:  Sterile dressing   Patient tolerance of procedure:  Tolerated well, no immediate complications   ED ECG REPORT I, [place] Jalayia Bagheri, the attending physician, personally viewed and interpreted this ECG.   Date: 12/24/2019  EKG Time: 7:38 AM  Rate: 72  Rhythm: Normal  Axis: Normal  Intervals: Normal  ST&T Change: None    Gregor Hams, MD 12/26/19 2307    Gregor Hams, MD 01/12/20 2229

## 2019-12-26 NOTE — ED Notes (Signed)
Hourly rounding reveals patient in room. Stable, in no acute distress. Q15 minute rounds and monitoring via Security Cam[employer] to continue. 

## 2019-12-26 NOTE — ED Notes (Signed)
Hourly rounding reveals patient awake in room. No complaints, stable, in no acute distress. Q15 minute rounds and monitoring via Security Cameras to continue. 

## 2019-12-26 NOTE — ED Notes (Signed)
Pt appears to have several rubber bands in hair, this tech asked pt to removed them; pt upset. Rubber bands were put in a bag and placed with her belongings. Will continue to monitor Q15 minute rounds.

## 2019-12-26 NOTE — ED Notes (Signed)
Hourly rounding reveals patient in rest room. No complaints, stable, in no acute distress. Q15 minute rounds and monitoring via Security Cameras to continue. 

## 2019-12-26 NOTE — ED Notes (Signed)
Hourly rounding reveals patient in day room. Stable, in no acute distress. Q15 minute rounds and monitoring via [employer] to continue.

## 2019-12-26 NOTE — ED Notes (Signed)
Patient moved to ED room 23. Patient having SI thoughts but currently does not have a plan. Patient states, "My plan did not work and there is nothing in this room I can hurt myself with." Patient asked if she could contract for safety with this writer that if she came up with a plan would she notify myself or staff so we could help her. Patient states, "No I don't think I can do that. Death is better than being alive."  Patient is currently a 1:1 with an ED tech sitting with patient. Charge RN Butch, and EDP Dr. Owens Shark made aware of status of patient.  LAC on left wrist cleaned and dressed. No bleeding noted.

## 2019-12-26 NOTE — ED Notes (Signed)
Report to include Situation, Background, Assessment, and Recommendations received from Jadeka RN. Patient alert and oriented, warm and dry, in no acute distress. Patient denies SI, HI, AVH and pain. Patient made aware of Q15 minute rounds and security cameras for their safety. Patient instructed to come to me with needs or concerns. 

## 2019-12-26 NOTE — ED Notes (Signed)
Patient said I have been here all day when am I leaving, I am ready to go. NAD noted. Patient was calm when talking with nurse, she said she just needs to get back home

## 2019-12-26 NOTE — ED Provider Notes (Signed)
Emergency Medicine Observation Re-evaluation Note  Katrina Page is a 39 y.o. female, seen on rounds today.  Pt initially presented to the ED for complaints of No chief complaint on file. Currently, the patient is calm.  Physical Exam  BP 129/74 (BP Location: Right Arm)   Pulse 82   Temp 98.7 F (37.1 C) (Oral)   Resp 16   Ht 5\' 9"  (1.753 m)   Wt 101 kg   SpO2 95%   BMI 32.88 kg/m  Physical Exam  ED Course / MDM  EKG:EKG Interpretation  Date/Time:  Saturday December 24 2019 07:38:00 EDT Ventricular Rate:  72 PR Interval:  156 QRS Duration: 94 QT Interval:  420 QTC Calculation: 459 R Axis:   85 Text Interpretation: Normal sinus rhythm Incomplete right bundle branch block Borderline ECG When compared with ECG of 23-Jul-2019 11:07, Questionable change in QRS axis Nonspecific T wave abnormality no longer evident in Anterior leads ----------unconfirmed---------- Confirmed by UNCONFIRMED, DOCTOR ([id number]), editor Frederich Cha [phone]) on 12/25/2019 11:37:55 AM    I have reviewed the labs performed to date as well as medications administered while in observation.  Recent changes in the last 24 hours include none.  Patient remains medically stable to proceed with psychiatric treatment. Plan  Current plan is for inpatient psychiatric care. Patient is under full IVC at this time.   Carrie Mew, MD 12/26/19 0500

## 2019-12-26 NOTE — ED Notes (Signed)
Gave pt items to take shower. Gave breakfast tray with juice.

## 2019-12-26 NOTE — ED Notes (Signed)
Patient has been complaining of back pain, and body aches. Patient has stated that the bed she is laying in here is not helping her back. She has been given pain medications but is saying they are not strong enough

## 2019-12-26 NOTE — ED Notes (Signed)
IVC / Consult completed/ Pending Placement 

## 2019-12-27 MED ORDER — KETOROLAC TROMETHAMINE 10 MG PO TABS
10.0000 mg | ORAL_TABLET | Freq: Four times a day (QID) | ORAL | Status: DC | PRN
  Administered 2019-12-27 – 2019-12-28 (×2): 10 mg via ORAL
  Filled 2019-12-27 (×2): qty 1

## 2019-12-27 MED ORDER — OXYCODONE HCL 5 MG PO TABS
5.0000 mg | ORAL_TABLET | Freq: Two times a day (BID) | ORAL | Status: DC | PRN
  Administered 2019-12-27 – 2019-12-28 (×3): 5 mg via ORAL
  Filled 2019-12-27 (×3): qty 1

## 2019-12-27 MED ORDER — ACETAMINOPHEN 325 MG PO TABS
650.0000 mg | ORAL_TABLET | Freq: Four times a day (QID) | ORAL | Status: DC | PRN
  Administered 2019-12-27 – 2019-12-28 (×2): 650 mg via ORAL
  Filled 2019-12-27: qty 2

## 2019-12-27 NOTE — ED Notes (Signed)
Scheduled meds given. Patient sitting up in bed walks around room from time to time, reports she no longer needs a sitter. Has contracted for safety.

## 2019-12-27 NOTE — ED Notes (Signed)
Patient c/o of chest pain and joint pain. PRN meds given will monitor.

## 2019-12-27 NOTE — ED Notes (Signed)
Patient sitting up on edge of bed very talkative with 1:1 sitter. occassional gets up and walks to door way. Safety maintained.

## 2019-12-27 NOTE — ED Notes (Signed)
Breakfast provided. Patient ate and went to sleep.

## 2019-12-27 NOTE — ED Notes (Signed)
Pt provided with a sprite and a clean mask per pt request.

## 2019-12-27 NOTE — ED Notes (Signed)
Pt. Alert and oriented, warm and dry, in no distress. Pt continues to be 1:1 with sitter. Pt. Denies SI, HI, and AVH. Pt states she ios not going to hurt herself again that she made a bad choice last night. Pt. Encouraged to let nursing staff know of any concerns or needs.

## 2019-12-27 NOTE — ED Notes (Signed)
Pt ambulated to bathroom to urinate with this tech by her side.

## 2019-12-27 NOTE — ED Notes (Addendum)
Pt ambulated to the bathroom with this tech by her side. Pt urinated and then ambulated back to her rm. Pt provided with lunch tray but refused it and stated "this food they give me here makes me very nauseous" .

## 2019-12-27 NOTE — ED Notes (Signed)
PT handed this tech what looks like a piece of chicken wire that she found in her bad, that was possibly stuck to the bottom of my socik. Pt told this tech " I am giving you something that I just found, that could harm myself, and I did nit harm myself." " Do I get brownie points for that?" This tech informed PT that I would notify the nurse of what she found, where she found it,  and what she said about not harming herself with it.

## 2019-12-27 NOTE — ED Notes (Signed)
IVC/Pending Placement 
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
## 2019-12-27 NOTE — ED Notes (Addendum)
Pt ambulated to bathroom with this tech by her side. Pt urinated and then ambulated back to her room. Pt states that when she moves she is in a lot of pain and continues to make grunting sounds.

## 2019-12-27 NOTE — ED Notes (Signed)
Patient reports chest pain and muscle aches are getting better after medications, discomfort down to 2/10

## 2019-12-27 NOTE — ED Notes (Signed)
Dinner provided.

## 2019-12-27 NOTE — ED Notes (Signed)
Pt given meal tray and a sprite.

## 2019-12-28 ENCOUNTER — Inpatient Hospital Stay
Admission: RE | Admit: 2019-12-28 | Discharge: 2020-01-02 | DRG: 885 | Disposition: A | Discharge status: Home / Self Care | Payer: No Typology Code available for payment source | Source: Intra-hospital | Attending: Psychiatry | Admitting: Psychiatry

## 2019-12-28 ENCOUNTER — Encounter: Payer: Self-pay | Admitting: Psychiatry

## 2019-12-28 ENCOUNTER — Other Ambulatory Visit: Payer: Self-pay

## 2019-12-28 DIAGNOSIS — G43909 Migraine, unspecified, not intractable, without status migrainosus: Secondary | ICD-10-CM | POA: Diagnosis present

## 2019-12-28 DIAGNOSIS — Z9151 Personal history of suicidal behavior: Secondary | ICD-10-CM

## 2019-12-28 DIAGNOSIS — Z91410 Personal history of adult physical and sexual abuse: Secondary | ICD-10-CM

## 2019-12-28 DIAGNOSIS — T50902A Poisoning by unspecified drugs, medicaments and biological substances, intentional self-harm, initial encounter: Secondary | ICD-10-CM

## 2019-12-28 DIAGNOSIS — Z915 Personal history of self-harm: Secondary | ICD-10-CM

## 2019-12-28 DIAGNOSIS — Z79899 Other long term (current) drug therapy: Secondary | ICD-10-CM | POA: Diagnosis not present

## 2019-12-28 DIAGNOSIS — Z823 Family history of stroke: Secondary | ICD-10-CM

## 2019-12-28 DIAGNOSIS — G8929 Other chronic pain: Secondary | ICD-10-CM | POA: Diagnosis present

## 2019-12-28 DIAGNOSIS — J449 Chronic obstructive pulmonary disease, unspecified: Secondary | ICD-10-CM | POA: Diagnosis present

## 2019-12-28 DIAGNOSIS — Z818 Family history of other mental and behavioral disorders: Secondary | ICD-10-CM

## 2019-12-28 DIAGNOSIS — G4700 Insomnia, unspecified: Secondary | ICD-10-CM | POA: Diagnosis present

## 2019-12-28 DIAGNOSIS — Z8249 Family history of ischemic heart disease and other diseases of the circulatory system: Secondary | ICD-10-CM

## 2019-12-28 DIAGNOSIS — F17210 Nicotine dependence, cigarettes, uncomplicated: Secondary | ICD-10-CM | POA: Diagnosis present

## 2019-12-28 DIAGNOSIS — R41843 Psychomotor deficit: Secondary | ICD-10-CM | POA: Diagnosis present

## 2019-12-28 DIAGNOSIS — Z833 Family history of diabetes mellitus: Secondary | ICD-10-CM

## 2019-12-28 DIAGNOSIS — R4587 Impulsiveness: Secondary | ICD-10-CM | POA: Diagnosis present

## 2019-12-28 DIAGNOSIS — Z7901 Long term (current) use of anticoagulants: Secondary | ICD-10-CM

## 2019-12-28 DIAGNOSIS — Z9049 Acquired absence of other specified parts of digestive tract: Secondary | ICD-10-CM

## 2019-12-28 DIAGNOSIS — M549 Dorsalgia, unspecified: Secondary | ICD-10-CM

## 2019-12-28 DIAGNOSIS — Z79891 Long term (current) use of opiate analgesic: Secondary | ICD-10-CM

## 2019-12-28 DIAGNOSIS — Z720 Tobacco use: Secondary | ICD-10-CM

## 2019-12-28 DIAGNOSIS — F329 Major depressive disorder, single episode, unspecified: Secondary | ICD-10-CM | POA: Diagnosis present

## 2019-12-28 DIAGNOSIS — Z803 Family history of malignant neoplasm of breast: Secondary | ICD-10-CM

## 2019-12-28 DIAGNOSIS — F332 Major depressive disorder, recurrent severe without psychotic features: Secondary | ICD-10-CM | POA: Diagnosis present

## 2019-12-28 DIAGNOSIS — R0789 Other chest pain: Secondary | ICD-10-CM

## 2019-12-28 DIAGNOSIS — R630 Anorexia: Secondary | ICD-10-CM | POA: Diagnosis present

## 2019-12-28 MED ORDER — ACETAMINOPHEN 325 MG PO TABS: 650.0000 mg | ORAL_TABLET | Freq: Four times a day (QID) | ORAL | Status: DC | PRN

## 2019-12-28 MED ORDER — ACETAMINOPHEN 325 MG PO TABS
650.0000 mg | ORAL_TABLET | Freq: Four times a day (QID) | ORAL | Status: DC | PRN
  Administered 2019-12-28 – 2019-12-31 (×5): 650 mg via ORAL
  Filled 2019-12-28 (×6): qty 2

## 2019-12-28 MED ORDER — KETOROLAC TROMETHAMINE 10 MG PO TABS
10.0000 mg | ORAL_TABLET | Freq: Four times a day (QID) | ORAL | Status: DC
  Administered 2019-12-28 – 2019-12-29 (×2): 10 mg via ORAL
  Filled 2019-12-28 (×4): qty 1

## 2019-12-28 MED ORDER — TRAZODONE HCL 50 MG PO TABS
50.0000 mg | ORAL_TABLET | Freq: Every evening | ORAL | Status: DC | PRN
  Administered 2019-12-28 – 2019-12-29 (×2): 50 mg via ORAL
  Filled 2019-12-28 (×2): qty 1

## 2019-12-28 MED ORDER — ALUM & MAG HYDROXIDE-SIMETH 200-200-20 MG/5ML PO SUSP: 30.0000 mL | ORAL | Status: DC | PRN

## 2019-12-28 MED ORDER — ALBUTEROL SULFATE (2.5 MG/3ML) 0.083% IN NEBU
2.5000 mg | INHALATION_SOLUTION | RESPIRATORY_TRACT | Status: DC | PRN
  Administered 2019-12-28 – 2019-12-29 (×3): 2.5 mg via RESPIRATORY_TRACT
  Filled 2019-12-28 (×3): qty 3

## 2019-12-28 MED ORDER — ALBUTEROL SULFATE (2.5 MG/3ML) 0.083% IN NEBU: 2.5000 mg | INHALATION_SOLUTION | RESPIRATORY_TRACT | Status: DC | PRN

## 2019-12-28 MED ORDER — MAGNESIUM HYDROXIDE 400 MG/5ML PO SUSP: 30.0000 mL | Freq: Every day | ORAL | Status: DC | PRN
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
## 2019-12-28 NOTE — BH Assessment (Signed)
Patient is to be admitted to [facility] BMU by Dr. Ainsley Spinner.  Attending Physician will be Dr. Weber Cooks.   Patient has been assigned to room 307, by [place].   ER staff is aware of the admission:  Luan,ER Secretary    Dr. Cherylann Banas, ER MD   Gust Rung., Patient Access.

## 2019-12-28 NOTE — Plan of Care (Signed)
  Problem: Education: Goal: Utilization of techniques to improve thought processes will improve Outcome: Not Progressing Goal: Knowledge of the prescribed therapeutic regimen will improve Outcome: Not Progressing   Problem: Activity: Goal: Interest or engagement in leisure activities will improve Outcome: Not Progressing Goal: Imbalance in normal sleep/wake cycle will improve Outcome: Not Progressing   Problem: Coping: Goal: Coping ability will improve Outcome: Not Progressing Goal: Will verbalize feelings Outcome: Not Progressing   Problem: Coping: Goal: Coping ability will improve Outcome: Not Progressing   Problem: Medication: Goal: Compliance with prescribed medication regimen will improve Outcome: Not Progressing   Problem: Self-Concept: Goal: Ability to disclose and discuss suicidal ideas will improve Outcome: Not Progressing Goal: Will verbalize positive feelings about self Outcome: Not Progressing

## 2019-12-28 NOTE — Plan of Care (Signed)
New admission  Problem: Education: Goal: Utilization of techniques to improve thought processes will improve Outcome: Not Progressing Goal: Knowledge of the prescribed therapeutic regimen will improve Outcome: Not Progressing   Problem: Activity: Goal: Interest or engagement in leisure activities will improve Outcome: Not Progressing Goal: Imbalance in normal sleep/wake cycle will improve Outcome: Not Progressing   Problem: Coping: Goal: Coping ability will improve Outcome: Not Progressing Goal: Will verbalize feelings Outcome: Not Progressing   Problem: Coping: Goal: Coping ability will improve Outcome: Not Progressing   Problem: Medication: Goal: Compliance with prescribed medication regimen will improve Outcome: Not Progressing   Problem: Self-Concept: Goal: Ability to disclose and discuss suicidal ideas will improve Outcome: Not Progressing Goal: Will verbalize positive feelings about self Outcome: Not Progressing

## 2019-12-28 NOTE — ED Notes (Signed)
Pt given supplies to shower. Pt is able to shower independently. Bed linen changed.

## 2019-12-28 NOTE — ED Notes (Signed)
Pt in shower, breakfast tray placed on sink in rm.

## 2019-12-28 NOTE — ED Provider Notes (Signed)
Emergency Medicine Observation Re-evaluation Note  Katrina Page is a 39 y.o. female, seen on rounds today.  Pt initially presented to the ED for complaints of No chief complaint on file. Currently, the patient is resting in no acute distress.  Physical Exam  BP 136/81 (BP Location: Right Arm)   Pulse 81   Temp 98.3 F (36.8 C) (Oral)   Resp 16   Ht 5\' 9"  (1.753 m)   Wt 101 kg   SpO2 100%   BMI 32.88 kg/m  Physical Exam  ED Course / MDM  EKG:EKG Interpretation  Date/Time:  Saturday December 24 2019 07:38:00 EDT Ventricular Rate:  72 PR Interval:  156 QRS Duration: 94 QT Interval:  420 QTC Calculation: 459 R Axis:   85 Text Interpretation: Normal sinus rhythm Incomplete right bundle branch block Borderline ECG When compared with ECG of 23-Jul-2019 11:07, Questionable change in QRS axis Nonspecific T wave abnormality no longer evident in Anterior leads ----------unconfirmed---------- Confirmed by UNCONFIRMED, DOCTOR ([id number]), editor Frederich Cha [phone]) on 12/25/2019 11:37:55 AM    I have reviewed the labs performed to date as well as medications administered while in observation. No events overnight.  Plan  Current plan is for psychiatric disposition. Patient is under full IVC at this time.   Paulette Blanch, MD 12/28/19 [phone]

## 2019-12-28 NOTE — Tx Team (Signed)
Initial Treatment Plan 12/28/2019 6:22 PM Katrina Page [id number]    PATIENT STRESSORS: Financial difficulties Health problems Marital or family conflict Substance abuse   PATIENT STRENGTHS: Average or above average intelligence Communication skills   PATIENT IDENTIFIED PROBLEMS: Depression   Anxiety    Suicidal                  DISCHARGE CRITERIA:  Ability to meet basic life and health needs Improved stabilization in mood, thinking, and/or behavior Reduction of life-threatening or endangering symptoms to within safe limits Verbal commitment to aftercare and medication compliance  PRELIMINARY DISCHARGE PLAN: Outpatient therapy Return to previous living arrangement Return to previous work or school arrangements  PATIENT/FAMILY INVOLVEMENT: This treatment plan has been presented to and reviewed with the patient, Katrina Page.The patient has been given the opportunity to ask questions and make suggestions.  Kieth Brightly, RN 12/28/2019, 6:22 PM

## 2019-12-28 NOTE — ED Notes (Signed)
Patient observed lying in bed with eyes closed  Even, unlabored respirations observed   NAD assessed   She is sleeping   I will continue to monitor along with every 15 minute visual observations     She is IVC  - pending inpatient treatment

## 2019-12-28 NOTE — BH Assessment (Signed)
Follow-Up was made at the following facilities:   . [facility] [phone]) [facility] denied due to no insurance  . [facility] ([phone]--[phone]) No current intake staff, available during the day, unable to leave a voicemail  . [facility] ([phone]---[phone]), No current intake, available during the day, unable to leave a voicemail  . [facility] [phone], [phone] or [phone]), Left information with Administrative staff for daytime intake staff to contact back.  . [facility] [phone] or [phone]) Not accepting outside referrals  . [facility] [phone]), Re-faxed referral  . [facility] [phone] -or- [phone]), Denied due Medical   . [facility] [phone]). No current intake staff, voicemail was left  . [facility] [phone]) [facility] [phone]) Staff will pass information along to psychiatrist for review, contact back to follow-up   [facility] [phone]) No intake staff currently available, voicemail was left    [facility] [phone]) No answer   [facility] [phone]) No available beds

## 2019-12-28 NOTE — Progress Notes (Signed)
I called Dr.Clapac for some more orders that patient is requesting. Dr.Clapcs is going to look at them. Night shift says that they will do his EKG. Collier Bullock RN

## 2019-12-28 NOTE — ED Notes (Signed)
Pt was given two chocolate milks.

## 2019-12-28 NOTE — ED Notes (Signed)
Pt ambulated to the bathroom with no dificulties. Pt given a cup of sprite per request.
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
## 2019-12-29 DIAGNOSIS — Z9151 Personal history of suicidal behavior: Secondary | ICD-10-CM

## 2019-12-29 DIAGNOSIS — F17210 Nicotine dependence, cigarettes, uncomplicated: Secondary | ICD-10-CM | POA: Insufficient documentation

## 2019-12-29 DIAGNOSIS — G8929 Other chronic pain: Secondary | ICD-10-CM

## 2019-12-29 DIAGNOSIS — F332 Major depressive disorder, recurrent severe without psychotic features: Secondary | ICD-10-CM

## 2019-12-29 DIAGNOSIS — T50902A Poisoning by unspecified drugs, medicaments and biological substances, intentional self-harm, initial encounter: Secondary | ICD-10-CM

## 2019-12-29 DIAGNOSIS — Z720 Tobacco use: Secondary | ICD-10-CM

## 2019-12-29 LAB — HEPATIC FUNCTION PANEL
ALT: 9 U/L (ref 0–44)
AST: 11 U/L — ABNORMAL LOW (ref 15–41)
Albumin: 3.6 g/dL (ref 3.5–5.0)
Alkaline Phosphatase: 48 U/L (ref 38–126)
Bilirubin, Direct: 0.1 mg/dL (ref 0.0–0.2)
Indirect Bilirubin: 0.5 mg/dL (ref 0.3–0.9)
Total Bilirubin: 0.6 mg/dL (ref 0.3–1.2)
Total Protein: 7.1 g/dL (ref 6.5–8.1)

## 2019-12-29 LAB — LIPID PANEL
Cholesterol: 121 mg/dL (ref 0–200)
HDL: 53 mg/dL (ref >40)
LDL Cholesterol: 49 mg/dL (ref 0–99)
Total CHOL/HDL Ratio: 2.3 RATIO
Triglycerides: 94 mg/dL (ref <150)
VLDL: 19 mg/dL (ref 0–40)

## 2019-12-29 LAB — TSH: TSH: 3.337 u[IU]/mL (ref 0.350–4.500)

## 2019-12-29 MED ORDER — KETOROLAC TROMETHAMINE 10 MG PO TABS
10.0000 mg | ORAL_TABLET | Freq: Four times a day (QID) | ORAL | Status: DC | PRN
  Administered 2019-12-30 – 2019-12-31 (×2): 10 mg via ORAL
  Filled 2019-12-29 (×3): qty 1

## 2019-12-29 MED ORDER — NICOTINE 21 MG/24HR TD PT24
21.0000 mg | MEDICATED_PATCH | Freq: Every day | TRANSDERMAL | Status: DC
  Administered 2019-12-29 – 2020-01-02 (×5): 21 mg via TRANSDERMAL
  Filled 2019-12-29 (×6): qty 1

## 2019-12-29 MED ORDER — FLUOXETINE HCL 20 MG PO CAPS
20.0000 mg | ORAL_CAPSULE | Freq: Every day | ORAL | Status: DC
  Administered 2019-12-29 – 2019-12-30 (×2): 20 mg via ORAL
  Filled 2019-12-29 (×2): qty 1

## 2019-12-29 MED ORDER — NICOTINE POLACRILEX 2 MG MT GUM
2.0000 mg | CHEWING_GUM | OROMUCOSAL | Status: AC | PRN
  Administered 2019-12-29: 12:00:00 2 mg via ORAL
  Filled 2019-12-29 (×3): qty 1

## 2019-12-29 MED ORDER — OXYCODONE HCL 5 MG PO TABS
10.0000 mg | ORAL_TABLET | ORAL | Status: AC
  Administered 2019-12-29: 11:00:00 10 mg via ORAL
  Filled 2019-12-29: qty 2

## 2019-12-29 MED ORDER — ALBUTEROL SULFATE HFA 108 (90 BASE) MCG/ACT IN AERS
1.0000 | INHALATION_SPRAY | RESPIRATORY_TRACT | Status: DC | PRN
  Administered 2019-12-30 – 2020-01-01 (×4): 2 via RESPIRATORY_TRACT
  Filled 2019-12-29: qty 6.7

## 2019-12-29 MED ORDER — OXYCODONE HCL 5 MG PO TABS
10.0000 mg | ORAL_TABLET | Freq: Four times a day (QID) | ORAL | Status: DC | PRN
  Administered 2019-12-29 – 2020-01-02 (×13): 10 mg via ORAL
  Filled 2019-12-29 (×13): qty 2

## 2019-12-29 MED ORDER — RIVAROXABAN 20 MG PO TABS
20.0000 mg | ORAL_TABLET | Freq: Every day | ORAL | Status: DC
  Administered 2019-12-29 – 2020-01-01 (×4): 20 mg via ORAL
  Filled 2019-12-29 (×5): qty 1

## 2019-12-29 NOTE — BHH Suicide Risk Assessment (Signed)
[facility] Admission Suicide Risk Assessment   Nursing information obtained from:  Patient Demographic factors:  Caucasian, Low socioeconomic status Current Mental Status:  Suicidal ideation indicated by patient, Suicide plan Loss Factors:  Decline in physical health, Financial problems / change in socioeconomic status(family stress) Historical Factors:  NA Risk Reduction Factors:  NA  Total Time spent with patient: 1 hour Principal Problem: Severe recurrent major depression without psychotic features ([place]) Diagnosis:  Principal Problem:   Severe recurrent major depression without psychotic features ([place]) Active Problems:   MDD (major depressive disorder)   Suicide attempt by drug overdose ([place])   Chronic back pain   Nicotine abuse  Subjective Data: Patient seen chart reviewed.  39 year old woman presented to the emergency room after taking an intentional overdose of medication combined with alcohol in a suicide attempt.  Patient continues to endorse depressed mood suicidal ideation hopelessness negativity low energy multiple symptoms of depression.  Not acutely psychotic.  Cooperative and agreeable to treatment.  Continued Clinical Symptoms:  Alcohol Use Disorder Identification Test Final Score (AUDIT): 40 The "Alcohol Use Disorders Identification Test", Guidelines for Use in Primary Care, Second Edition.  World Pharmacologist [facility]). Score between 0-7:  no or low risk or alcohol related problems. Score between 8-15:  moderate risk of alcohol related problems. Score between 16-19:  high risk of alcohol related problems. Score 20 or above:  warrants further diagnostic evaluation for alcohol dependence and treatment.   CLINICAL FACTORS:   Depression:   Comorbid alcohol abuse/dependence Hopelessness Impulsivity   Musculoskeletal: Strength & Muscle Tone: decreased Gait & Station: unsteady Patient leans: N/A  Psychiatric Specialty Exam: Physical Exam  Nursing note and vitals  reviewed. Constitutional: She appears well-developed and well-nourished.  HENT:  Head: Normocephalic and atraumatic.  Eyes: Pupils are equal, round, and reactive to light. Conjunctivae are normal.  Cardiovascular: Regular rhythm and normal heart sounds.  Respiratory: Effort normal. No respiratory distress.  GI: Soft.  Musculoskeletal:        General: Normal range of motion.     Cervical back: Normal range of motion.  Neurological: She is alert.  Skin: Skin is warm and dry.  Psychiatric: Her speech is delayed. She is slowed. Cognition and memory are normal. She expresses impulsivity. She exhibits a depressed mood. She expresses suicidal ideation. She expresses no suicidal plans.    Review of Systems  Constitutional: Negative.   HENT: Negative.   Eyes: Negative.   Respiratory: Negative.   Cardiovascular: Negative.   Gastrointestinal: Negative.   Musculoskeletal: Positive for back pain and myalgias.  Skin: Negative.   Neurological: Negative.   Psychiatric/Behavioral: Positive for dysphoric mood, self-injury and suicidal ideas. The patient is nervous/anxious.     Blood pressure (!) 154/81, pulse 80, temperature 97.9 F (36.6 C), temperature source Oral, resp. rate 18, height 5' 9"  (1.753 m), weight 94.8 kg, SpO2 100 %.Body mass index is 30.86 kg/m.  General Appearance: Disheveled  Eye Contact:  Fair  Speech:  Slow  Volume:  Decreased  Mood:  Depressed  Affect:  Depressed and Tearful  Thought Process:  Coherent  Orientation:  Full (Time, Place, and Person)  Thought Content:  Logical  Suicidal Thoughts:  Yes.  with intent/plan  Homicidal Thoughts:  No  Memory:  Immediate;   Fair Recent;   Fair Remote;   Fair  Judgement:  Fair  Insight:  Fair  Psychomotor Activity:  Decreased  Concentration:  Concentration: Fair  Recall:  [employer] of Knowledge:  Fair  Language:  Fair  Akathisia:  No  Handed:  Right  AIMS (if indicated):     Assets:  Desire for  Improvement Housing Social Support  ADL's:  Impaired  Cognition:  WNL  Sleep:  Number of Hours: 5      COGNITIVE FEATURES THAT CONTRIBUTE TO RISK:  Closed-mindedness    SUICIDE RISK:   Moderate:  Frequent suicidal ideation with limited intensity, and duration, some specificity in terms of plans, no associated intent, good self-control, limited dysphoria/symptomatology, some risk factors present, and identifiable protective factors, including available and accessible social support.  PLAN OF CARE: Continue 15-minute checks.  Patient met with treatment team.  Individual and group counseling and evaluation.  Discussed with patient starting antidepressant medication for what appears to be major depressive disorder.  Treat medical problems.  Reassess dangerousness prior to discharge planning.  I certify that inpatient services furnished can reasonably be expected to improve the patient's condition.   Alethia Berthold, MD 12/29/2019, 12:02 PM

## 2019-12-29 NOTE — BHH Group Notes (Signed)
[facility] Group Notes:  (Nursing/MHT/Case Management/Adjunct)  Date:  12/29/2019  Time:  9:07 PM  Type of Therapy:  Group Therapy  Participation Level:  Active  Participation Quality:  Appropriate and Pt. was sad.  Affect:  Appropriate  Cognitive:  Alert  Insight:  Good  Engagement in Group:  Engaged  Modes of Intervention:  Support  Summary of Progress/Problems:  Katrina Page 12/29/2019, 9:07 PM
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
## 2019-12-29 NOTE — [facility] Suicide Risk Assessment (Signed)
[facility] INPATIENT:  Family/Significant Other Suicide Prevention Education  Suicide Prevention Education:  Patient Refusal for Family/Significant Other Suicide Prevention Education: The patient Katrina Page has refused to provide written consent for family/significant other to be provided Family/Significant Other Suicide Prevention Education during admission and/or prior to discharge.  Physician notified.  SPE completed with pt, as pt refused to consent to family contact. SPI pamphlet provided to pt and pt was encouraged to share information with support network, ask questions, and talk about any concerns relating to SPE. Pt denies access to guns/firearms and verbalized understanding of information provided. Mobile Crisis information also provided to pt.   Rozann Lesches 12/29/2019, 10:48 AM

## 2019-12-29 NOTE — Progress Notes (Signed)
Recreation Therapy Notes   Date: 12/29/2019  Time: 9:30 am   Location: Outside    Behavioral response: N/A   Intervention Topic: Animal Assisted therapy    Discussion/Intervention: Patient did not attend group.   Clinical Observations/Feedback:  Patient did not attend group.   Nobuko Gsell LRT/CTRS        Dinisha Cai 12/29/2019 10:24 AM

## 2019-12-29 NOTE — Progress Notes (Signed)
Recreation Therapy Notes  INPATIENT RECREATION THERAPY ASSESSMENT  Patient Details Name: Katrina Page MRN: [id number] DOB: August 13, 1981 Today's Date: 12/29/2019       Information Obtained From: Patient  Able to Participate in Assessment/Interview: Yes  Patient Presentation: Responsive  Reason for Admission (Per Patient): Active Symptoms, Suicidal Ideation, Suicide Attempt  Patient Stressors: (Life)  Coping Skills:   Other (Comment)(Nothing)  Leisure Interests (2+):  Individual - TV, Social - Family  Frequency of Recreation/Participation: Monthly  Awareness of Community Resources:     Community Resources:     Current Use:    If no, Barriers?:    Expressed Interest in [place] of Residence:  Insurance underwriter  Patient Main Form of Transportation: Musician  Patient Strengths:  Nothing  Patient Identified Areas of Improvement:  No clue  Patient Goal for Hospitalization:  Be able to smile  Current SI (including self-harm):  No  Current HI:  No  Current AVH: No  Staff Intervention Plan: Collaborate with Interdisciplinary Treatment Team, Group Attendance  Consent to Intern Participation: N/A  Katrina Page 12/29/2019, 2:42 PM

## 2019-12-29 NOTE — [facility] Group Notes (Signed)
[place] Group Notes:  (Nursing/MHT/Case Management/Adjunct)  Date:  12/29/2019  Time:  8:46 AM  Type of Therapy:  Community Meeting  Participation Level:  Did Not Attend   Adela L[facility] 12/29/2019, 8:46 AM

## 2019-12-29 NOTE — Progress Notes (Signed)
Pt still complains of pain 10/10. She received a nebulizer treatment from respiratory at 1730. She says that it helped. She did go to the pharmacology group outdoors. She has a pained, blunted, depressed affect. She seemed to be irritable this morning however this afternoon she seems to be more calm. Collier Bullock RN

## 2019-12-29 NOTE — Plan of Care (Signed)
Pt rates anxiety and depression both 10/10. Pt denies SI, HI and AVH.Marland Kitchen Pt was educated on care plan and verbalizes understanding. Collier Bullock RN   Problem: Education: Goal: Utilization of techniques to improve thought processes will improve Outcome: Progressing Goal: Knowledge of the prescribed therapeutic regimen will improve Outcome: Progressing   Problem: Activity: Goal: Interest or engagement in leisure activities will improve Outcome: Progressing Goal: Imbalance in normal sleep/wake cycle will improve Outcome: Progressing   Problem: Coping: Goal: Coping ability will improve Outcome: Progressing Goal: Will verbalize feelings Outcome: Progressing   Problem: Coping: Goal: Coping ability will improve Outcome: Progressing   Problem: Medication: Goal: Compliance with prescribed medication regimen will improve Outcome: Progressing   Problem: Self-Concept: Goal: Ability to disclose and discuss suicidal ideas will improve Outcome: Progressing Goal: Will verbalize positive feelings about self Outcome: Progressing   Problem: Education: Goal: Knowledge of [place] [facility] Education information/materials will improve Outcome: Progressing Goal: Emotional status will improve Outcome: Progressing Goal: Mental status will improve Outcome: Progressing Goal: Verbalization of understanding the information provided will improve Outcome: Progressing

## 2019-12-29 NOTE — Progress Notes (Signed)
Resident up all night requesting meds d/t pain. Med seeking behavior

## 2019-12-29 NOTE — Tx Team (Addendum)
Interdisciplinary Treatment and Diagnostic Plan Update  12/29/2019 Time of Session: 9:00AM Katrina Page MRN: [id number]  Principal Diagnosis: <principal problem not specified>  Secondary Diagnoses: Active Problems:   MDD (major depressive disorder)   Current Medications:  Current Facility-Administered Medications  Medication Dose Route Frequency Provider Last Rate Last Admin  . acetaminophen (TYLENOL) tablet 650 mg  650 mg Oral Q6H PRN Cristofano, Dorene Ar, MD   650 mg at 12/29/19 0124  . albuterol (PROVENTIL) (2.5 MG/3ML) 0.083% nebulizer solution 2.5 mg  2.5 mg Inhalation Q4H PRN Cristofano, Dorene Ar, MD   2.5 mg at 12/28/19 2211  . alum & mag hydroxide-simeth (MAALOX/MYLANTA) 200-200-20 MG/5ML suspension 30 mL  30 mL Oral Q4H PRN Cristofano, Dorene Ar, MD      . FLUoxetine (PROZAC) capsule 20 mg  20 mg Oral Daily Clapacs, John T, MD      . ketorolac (TORADOL) tablet 10 mg  10 mg Oral Q6H PRN Clapacs, John T, MD      . magnesium hydroxide (MILK OF MAGNESIA) suspension 30 mL  30 mL Oral Daily PRN Cristofano, Paul A, MD      . nicotine (NICODERM CQ - dosed in mg/24 hours) patch 21 mg  21 mg Transdermal Daily Clapacs, John T, MD      . nicotine polacrilex (NICORETTE) gum 2 mg  2 mg Oral PRN Clapacs, John T, MD      . oxyCODONE (Oxy IR/ROXICODONE) immediate release tablet 10 mg  10 mg Oral NOW Clapacs, John T, MD      . oxyCODONE (Oxy IR/ROXICODONE) immediate release tablet 10 mg  10 mg Oral Q6H PRN Clapacs, John T, MD      . rivaroxaban (XARELTO) tablet 20 mg  20 mg Oral Q supper Clapacs, John T, MD      . traZODone (DESYREL) tablet 50 mg  50 mg Oral QHS PRN Caroline Sauger, NP   50 mg at 12/28/19 2204   PTA Medications: Medications Prior to Admission  Medication Sig Dispense Refill Last Dose  . acetaminophen (TYLENOL) 325 MG tablet Take 650 mg by mouth every 6 (six) hours as needed for mild pain or fever.      Marland Kitchen albuterol (PROVENTIL HFA;VENTOLIN HFA) 108 (90 Base) MCG/ACT inhaler Inhale  2 puffs into the lungs every 4 (four) hours as needed for wheezing or shortness of breath. 1 Inhaler 0   . ketorolac (TORADOL) 10 MG tablet Take 10 mg by mouth every 6 (six) hours as needed for moderate pain.     . Oxycodone HCl 10 MG TABS Take 10 mg by mouth every 6 (six) hours as needed (moderate to severe pain).     . rivaroxaban (XARELTO) 20 MG TABS tablet Take 1 tablet (20 mg total) by mouth daily with supper. 30 tablet 5     Patient Stressors: Financial difficulties Health problems Marital or family conflict Substance abuse  Patient Strengths: Average or above average intelligence Communication skills  Treatment Modalities: Medication Management, Group therapy, Case management,  1 to 1 session with clinician, Psychoeducation, Recreational therapy.   Physician Treatment Plan for Primary Diagnosis: <principal problem not specified> Long Term Goal(s):     Short Term Goals:    Medication Management: Evaluate patient's response, side effects, and tolerance of medication regimen.  Therapeutic Interventions: 1 to 1 sessions, Unit Group sessions and Medication administration.  Evaluation of Outcomes: Not Met  Physician Treatment Plan for Secondary Diagnosis: Active Problems:   MDD (major depressive disorder)  Long Term  Goal(s):     Short Term Goals:       Medication Management: Evaluate patient's response, side effects, and tolerance of medication regimen.  Therapeutic Interventions: 1 to 1 sessions, Unit Group sessions and Medication administration.  Evaluation of Outcomes: Not Met   RN Treatment Plan for Primary Diagnosis: <principal problem not specified> Long Term Goal(s): Knowledge of disease and therapeutic regimen to maintain health will improve  Short Term Goals: Ability to demonstrate self-control, Ability to participate in decision making will improve, Ability to verbalize feelings will improve, Ability to disclose and discuss suicidal ideas, Ability to identify  and develop effective coping behaviors will improve and Compliance with prescribed medications will improve  Medication Management: RN will administer medications as ordered by provider, will assess and evaluate patient's response and provide education to patient for prescribed medication. RN will report any adverse and/or side effects to prescribing provider.  Therapeutic Interventions: 1 on 1 counseling sessions, Psychoeducation, Medication administration, Evaluate responses to treatment, Monitor vital signs and CBGs as ordered, Perform/monitor CIWA, COWS, AIMS and Fall Risk screenings as ordered, Perform wound care treatments as ordered.  Evaluation of Outcomes: Not Met   LCSW Treatment Plan for Primary Diagnosis: <principal problem not specified> Long Term Goal(s): Safe transition to appropriate next level of care at discharge, Engage patient in therapeutic group addressing interpersonal concerns.  Short Term Goals: Engage patient in aftercare planning with referrals and resources, Increase social support, Increase ability to appropriately verbalize feelings, Increase emotional regulation, Facilitate acceptance of mental health diagnosis and concerns, Facilitate patient progression through stages of change regarding substance use diagnoses and concerns, Identify triggers associated with mental health/substance abuse issues and Increase skills for wellness and recovery  Therapeutic Interventions: Assess for all discharge needs, 1 to 1 time with Social worker, Explore available resources and support systems, Assess for adequacy in community support network, Educate family and significant other(s) on suicide prevention, Complete Psychosocial Assessment, Interpersonal group therapy.  Evaluation of Outcomes: Not Met   Progress in Treatment: Attending groups: No. Participating in groups: No. Taking medication as prescribed: Yes. Toleration medication: Yes. Family/Significant other contact made:  Yes, individual(s) contacted:  SPE completed with pt. Pt declined SPE contact with family. Patient understands diagnosis: Yes. Discussing patient identified problems/goals with staff: Yes. Medical problems stabilized or resolved: Yes. Denies suicidal/homicidal ideation: No. Issues/concerns per patient self-inventory: No. Other: none  New problem(s) identified: No, Describe:  none  New Short Term/Long Term Goal(s): detox, medication management for mood stabilization; elimination of SI thoughts; development of comprehensive mental wellness/sobriety plan.  Patient Goals:  "I don't know.  I was voluntarily brought here and then told that I could not leave."  Discharge Plan or Barriers: Patient was unable to identify if she wanted to pursue inpatient or outpatient treatment.  CSW recommends inpatient at this time.   Reason for Continuation of Hospitalization: Depression Medication stabilization Suicidal ideation Withdrawal symptoms  Estimated Length of Stay: 1-7 days  Recreational Therapy: Patient Stressors: N/A Patient Goal: Patient will engage in groups without prompting or encouragement from LRT x3 group sessions within 5 recreation therapy group sessions  Attendees: Patient: Katrina Page 12/29/2019 10:49 AM  Physician: Dr. Weber Cooks, MD 12/29/2019 10:49 AM  Nursing: Collier Bullock, RN 12/29/2019 10:49 AM  RN Care Manager: 12/29/2019 10:49 AM  Social Worker: Assunta Curtis, LCSW 12/29/2019 10:49 AM  Recreational Therapist: Roanna Epley, Reather Converse, LRT 12/29/2019 10:49 AM  Other: Evalina Field, LCSW 12/29/2019 10:49 AM  Other:  12/29/2019 10:49 AM  Other: 12/29/2019  10:49 AM    Scribe for Treatment Team: Rozann Lesches, Marlinda Mike 12/29/2019 10:49 AM

## 2019-12-30 LAB — CBC
HCT: 28.6 % — ABNORMAL LOW (ref 36.0–46.0)
Hemoglobin: 7.8 g/dL — ABNORMAL LOW (ref 12.0–15.0)
MCH: 22.1 pg — ABNORMAL LOW (ref 26.0–34.0)
MCHC: 27.3 g/dL — ABNORMAL LOW (ref 30.0–36.0)
MCV: 81 fL (ref 80.0–100.0)
Platelets: 183 10*3/uL (ref 150–400)
RBC: 3.53 MIL/uL — ABNORMAL LOW (ref 3.87–5.11)
RDW: 21.1 % — ABNORMAL HIGH (ref 11.5–15.5)
WBC: 7.5 10*3/uL (ref 4.0–10.5)
nRBC: 0 % (ref 0.0–0.2)

## 2019-12-30 LAB — CREATININE, SERUM
Creatinine, Ser: 0.83 mg/dL (ref 0.44–1.00)
GFR calc Af Amer: >60 mL/min (ref >60)
GFR calc non Af Amer: >60 mL/min (ref >60)

## 2019-12-30 MED ORDER — HYDROXYZINE HCL 50 MG PO TABS
50.0000 mg | ORAL_TABLET | Freq: Three times a day (TID) | ORAL | Status: DC | PRN
  Administered 2019-12-30 – 2020-01-02 (×7): 50 mg via ORAL
  Filled 2019-12-30 (×7): qty 1

## 2019-12-30 MED ORDER — LORAZEPAM 1 MG PO TABS
1.0000 mg | ORAL_TABLET | Freq: Once | ORAL | Status: AC
  Administered 2019-12-30: 09:00:00 1 mg via ORAL
  Filled 2019-12-30: qty 1

## 2019-12-30 MED ORDER — TRAZODONE HCL 100 MG PO TABS
100.0000 mg | ORAL_TABLET | Freq: Every evening | ORAL | Status: DC | PRN
  Administered 2019-12-30: 22:00:00 100 mg via ORAL
  Filled 2019-12-30: qty 1

## 2019-12-30 MED ORDER — VENLAFAXINE HCL ER 75 MG PO CP24
75.0000 mg | ORAL_CAPSULE | Freq: Every day | ORAL | Status: DC
  Administered 2019-12-31 – 2020-01-02 (×3): 75 mg via ORAL
  Filled 2019-12-30 (×3): qty 1

## 2019-12-30 NOTE — [facility] Group Notes (Signed)
[place] Group Notes:  (Nursing/MHT/Case Management/Adjunct)  Date:  12/30/2019  Time:  9:26 AM  Type of Therapy:  Community Meeting  Participation Level:  Minimal  Participation Quality:  Pt left group early stating she was frustrated with another patients reponse.   Affect:  Irritable  Cognitive:  Appropriate  Insight:  Limited  Engagement in Group:  Defensive and Poor  Modes of Intervention:  Discussion, Education and Support  Summary of Progress/Problems:  Katrina Page 12/30/2019, 9:26 AM

## 2019-12-30 NOTE — [facility] Group Notes (Signed)
[place] Group Notes:  (Nursing/MHT/Case Management/Adjunct)  Date:  12/30/2019  Time:  11:37 AM  Type of Therapy:  Psychoeducational Skills  Participation Level:  Active  Participation Quality:  Appropriate  Affect:  Appropriate  Cognitive:  Alert and Appropriate  Insight:  Appropriate and Good  Engagement in Group:  Engaged  Modes of Intervention:  Clarification  Summary of Progress/Problems:  Katrina Page 12/30/2019, 11:37 AM

## 2019-12-30 NOTE — Progress Notes (Signed)
Patient alert and oriented x 4, her affect is blunted,  thoughts are organized and coherent, she appears anxious and needy, she complained of pain and sometimes was noted moaning, staff medicated her per PRN orders. Patient also c/o of SOB on exertion, her oxygen saturation was WNL, she requested for nebulizer treatment and she expressed relief after treatment. Patient is receptive to staff, complaint with medication regimen, 15 minutes safety checks maintained.

## 2019-12-30 NOTE — Progress Notes (Signed)
[facility] MD Progress Note  12/30/2019 12:02 PM Katrina Page  MRN:  [id number]   Subjective: Follow-up for this 39 year old female diagnosed with MDD severe.  Patient reports today that she is not having a very good day.  She states that another patient in group was making comments some noises and was being loud and made her upset because she was trying to share in group.  She states that she feels the same as she did when she came in here and that there is nothing really to live for.  Patient is stating that she is just ready to get out of here because this is doing nothing for her.  She also reports that she is not happy about having to sit in the emergency room for so many days before she finally got to make it to an inpatient unit.  Patient is redirectable and is coming down with discussion of improvements with medications and as well as improvements on the unit.  Principal Problem: Severe recurrent major depression without psychotic features ([place]) Diagnosis: Principal Problem:   Severe recurrent major depression without psychotic features ([place]) Active Problems:   MDD (major depressive disorder)   Suicide attempt by drug overdose ([place])   Chronic back pain   Nicotine abuse  Total Time spent with patient: 30 minutes  Past Psychiatric History: Reports that she has never tried to kill her self before.  She says she has had 1 previous psychiatric hospitalization approximately 20 years ago.  She cannot recall whether or not she was treated with any medicine at the time.  Has not had any interval psychiatric treatment at all.  Denies symptoms of mania.  Past Medical History:  Past Medical History:  Diagnosis Date  . Migraines     Past Surgical History:  Procedure Laterality Date  . CHOLECYSTECTOMY    . TUBAL LIGATION     Family History:  Family History  Problem Relation Age of Onset  . Hypertension Mother   . Diabetes Mother   . Stroke Mother   . Breast cancer Mother    Family  Psychiatric  History: Family history positive for schizophrenia in a brother, substance abuse and mood problems and other family members Social History:  Social History   Substance and Sexual Activity  Alcohol Use Yes   Comment: 2-3 week.       Social History   Substance and Sexual Activity  Drug Use No    Social History   Socioeconomic History  . Marital status: Divorced    Spouse name: Not on file  . Number of children: Not on file  . Years of education: Not on file  . Highest education level: Not on file  Occupational History  . Not on file  Tobacco Use  . Smoking status: Current Every Day Smoker    Packs/day: 0.50    Years: 20.00    Pack years: 10.00    Types: Cigarettes  . Smokeless tobacco: Never Used  Substance and Sexual Activity  . Alcohol use: Yes    Comment: 2-3 week.    . Drug use: No  . Sexual activity: Not on file  Other Topics Concern  . Not on file  Social History Narrative  . Not on file   Social Determinants of Health   Financial Resource Strain:   . Difficulty of Paying Living Expenses:   Food Insecurity:   . Worried About Charity fundraiser in the Last Year:   . [employer] of  Food in the Last Year:   Transportation Needs:   . Film/video editor (Medical):   Marland Kitchen Lack of Transportation (Non-Medical):   Physical Activity:   . Days of Exercise per Week:   . Minutes of Exercise per Session:   Stress:   . Feeling of Stress :   Social Connections:   . Frequency of Communication with Friends and Family:   . Frequency of Social Gatherings with Friends and Family:   . Attends Religious Services:   . Active Member of Clubs or Organizations:   . Attends Archivist Meetings:   Marland Kitchen Marital Status:    Additional Social History:                         Sleep: Fair  Appetite:  Fair  Current Medications: Current Facility-Administered Medications  Medication Dose Route Frequency Provider Last Rate Last Admin  . acetaminophen  (TYLENOL) tablet 650 mg  650 mg Oral Q6H PRN Cristofano, Dorene Ar, MD   650 mg at 12/29/19 2020  . albuterol (PROVENTIL) (2.5 MG/3ML) 0.083% nebulizer solution 2.5 mg  2.5 mg Inhalation Q4H PRN Cristofano, Dorene Ar, MD   2.5 mg at 12/29/19 2306  . albuterol (VENTOLIN HFA) 108 (90 Base) MCG/ACT inhaler 1-2 puff  1-2 puff Inhalation Q4H PRN Cristofano, Dorene Ar, MD      . alum & mag hydroxide-simeth (MAALOX/MYLANTA) 200-200-20 MG/5ML suspension 30 mL  30 mL Oral Q4H PRN Cristofano, Paul A, MD      . ketorolac (TORADOL) tablet 10 mg  10 mg Oral Q6H PRN Clapacs, Madie Reno, MD   10 mg at 12/30/19 0236  . magnesium hydroxide (MILK OF MAGNESIA) suspension 30 mL  30 mL Oral Daily PRN Cristofano, Paul A, MD      . nicotine (NICODERM CQ - dosed in mg/24 hours) patch 21 mg  21 mg Transdermal Daily Clapacs, Madie Reno, MD   21 mg at 12/30/19 0806  . oxyCODONE (Oxy IR/ROXICODONE) immediate release tablet 10 mg  10 mg Oral Q6H PRN Clapacs, Madie Reno, MD   10 mg at 12/30/19 0805  . rivaroxaban (XARELTO) tablet 20 mg  20 mg Oral Q supper Clapacs, Madie Reno, MD   20 mg at 12/29/19 1123  . traZODone (DESYREL) tablet 50 mg  50 mg Oral QHS PRN Caroline Sauger, NP   50 mg at 12/29/19 2143  . [START ON 12/31/2019] venlafaxine XR (EFFEXOR-XR) 24 hr capsule 75 mg  75 mg Oral Q breakfast Khya Halls, Lowry Ram, FNP        Lab Results:  Results for orders placed or performed during the hospital encounter of 12/28/19 (from the past 48 hour(s))  TSH     Status: None   Collection Time: 12/29/19  6:45 AM  Result Value Ref Range   TSH 3.337 0.350 - 4.500 uIU/mL    Comment: Performed by a 3rd Generation assay with a functional sensitivity of <=0.01 uIU/mL. Performed at [facility], [address] [place], [place] [address]   Hepatic function panel     Status: Abnormal   Collection Time: 12/29/19  6:45 AM  Result Value Ref Range   Total Protein 7.1 6.5 - 8.1 g/dL   Albumin 3.6 3.5 - 5.0 g/dL   AST 11 (L) 15 - 41 U/L   ALT 9 0 - 44  U/L   Alkaline Phosphatase 48 38 - 126 U/L   Total Bilirubin 0.6 0.3 - 1.2 mg/dL  Bilirubin, Direct 0.1 0.0 - 0.2 mg/dL   Indirect Bilirubin 0.5 0.3 - 0.9 mg/dL    Comment: Performed at [facility], [address] [place], [place] [address]  Lipid panel     Status: None   Collection Time: 12/29/19  6:45 AM  Result Value Ref Range   Cholesterol 121 0 - 200 mg/dL   Triglycerides 94 <150 mg/dL   HDL 53 >40 mg/dL   Total CHOL/HDL Ratio 2.3 RATIO   VLDL 19 0 - 40 mg/dL   LDL Cholesterol 49 0 - 99 mg/dL    Comment:        Total Cholesterol/HDL:CHD Risk Coronary Heart Disease Risk Table                     Men   Women  1/2 Average Risk   3.4   3.3  Average Risk       5.0   4.4  2 X Average Risk   9.6   7.1  3 X Average Risk  23.4   11.0        Use the calculated Patient Ratio above and the CHD Risk Table to determine the patient's CHD Risk.        ATP III CLASSIFICATION (LDL):  <100     mg/dL   Optimal  100-129  mg/dL   Near or Above                    Optimal  130-159  mg/dL   Borderline  160-189  mg/dL   High  >190     mg/dL   Very High Performed at [facility], [address] [place], [place] [address]   CBC     Status: Abnormal   Collection Time: 12/30/19  7:09 AM  Result Value Ref Range   WBC 7.5 4.0 - 10.5 K/uL   RBC 3.53 (L) 3.87 - 5.11 MIL/uL   Hemoglobin 7.8 (L) 12.0 - 15.0 g/dL   HCT 28.6 (L) 36.0 - 46.0 %   MCV 81.0 80.0 - 100.0 fL   MCH 22.1 (L) 26.0 - 34.0 pg   MCHC 27.3 (L) 30.0 - 36.0 g/dL   RDW 21.1 (H) 11.5 - 15.5 %   Platelets 183 150 - 400 K/uL   nRBC 0.0 0.0 - 0.2 %    Comment: Performed at [facility], [address] [place], [place] [address]  Creatinine, serum     Status: None   Collection Time: 12/30/19  7:09 AM  Result Value Ref Range   Creatinine, Ser 0.83 0.44 - 1.00 mg/dL   GFR calc non Af Amer >60 >60 mL/min   GFR calc Af Amer >60 >60 mL/min    Comment: Performed at [facility], [address] [place],  [address]    Blood Alcohol level:  Lab Results  Component Value Date   ETH 270 (H) [id number]    Metabolic Disorder Labs: Lab Results  Component Value Date   HGBA1C 4.8 11/30/2018   MPG 91.06 11/30/2018   No results found for: PROLACTIN Lab Results  Component Value Date   CHOL 121 12/29/2019   TRIG 94 12/29/2019   HDL 53 12/29/2019   CHOLHDL 2.3 12/29/2019   VLDL 19 12/29/2019   LDLCALC 49 12/29/2019    Physical Findings: AIMS:  , ,  ,  ,    CIWA:    COWS:     Musculoskeletal: Strength & Muscle  Tone: within normal limits Gait & Station: normal Patient leans: N/A  Psychiatric Specialty Exam: Physical Exam  Nursing note and vitals reviewed. Constitutional: She is oriented to person, place, and time. She appears well-developed and well-nourished.  Cardiovascular: Normal rate.  Respiratory: Effort normal.  Musculoskeletal:        General: Normal range of motion.  Neurological: She is alert and oriented to person, place, and time.  Skin: Skin is warm.  Psychiatric: She exhibits a depressed mood.    Review of Systems  Constitutional: Negative.   HENT: Negative.   Eyes: Negative.   Respiratory: Negative.   Cardiovascular: Negative.   Gastrointestinal: Negative.   Genitourinary: Negative.   Musculoskeletal: Negative.   Skin: Negative.   Neurological: Negative.   Psychiatric/Behavioral: Positive for dysphoric mood and suicidal ideas. The patient is nervous/anxious.     Blood pressure (!) 144/89, pulse 89, temperature 98.1 F (36.7 C), temperature source Oral, resp. rate 18, height 5\' 9"  (1.753 m), weight 94.8 kg, SpO2 100 %.Body mass index is 30.86 kg/m.  General Appearance: Casual  Eye Contact:  Fair  Speech:  Clear and Coherent and Normal Rate  Volume:  Decreased  Mood:  Anxious and Dysphoric  Affect:  Flat  Thought Process:  Coherent, Linear and Descriptions of Associations: Intact  Orientation:  Full (Time, Place, and Person)   Thought Content:  WDL  Suicidal Thoughts:  Yes.  without intent/plan  Homicidal Thoughts:  No  Memory:  Immediate;   Fair Recent;   Fair Remote;   Fair  Judgement:  Fair  Insight:  Fair  Psychomotor Activity:  Normal  Concentration:  Concentration: Fair  Recall:  [employer] of Knowledge:  Fair  Language:  Fair  Akathisia:  No  Handed:  Right  AIMS (if indicated):     Assets:  Desire for Improvement Housing Social Support  ADL's:  Intact  Cognition:  WNL  Sleep:  Number of Hours: 4.5   Assessment: Patient is seen by this provider and she presents calm and cooperative but appears to be depressed.  Patient has a flat affect and continues to report that she does feels there is nothing left to live for anymore.  She continues to report poor sleep, poor appetite, depressed mood, decreased energy, and no real reason to live anymore.  After discussing her medications I did mention Effexor to her and she reported that she was on that medication in the past that she stated that it did help her and she states that that is the one medication she remembers helping her the most.  Patient continues to report having complaints of pain and has continued her pain medications.  Treatment Plan Summary: Daily contact with patient to assess and evaluate symptoms and progress in treatment and Medication management Discontinue Prozac Start Effexor XR 75 mg p.o. 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 nightly as needed for insomnia Continue Toradol 10 mg p.o. every 6 hours as needed for moderate pain Continue oxycodone 10 mg p.o. every 6 hours as needed for severe pain Encourage group therapy participation Continue every 15 minute safety checks  Lewis Shock, FNP 12/30/2019, 12:02 PM

## 2019-12-30 NOTE — Plan of Care (Signed)
Patient was anxious,irritable and upset a peer this morning.After Ativan 1mg  patient states " my nerves better now." Patient states that she was overwhelmed with " work, kids and health issues." Patient is receptive and appropriate with staff & peers.Denies SI,HI and AVH.Compliant with medications.Attended groups.Appetite and energy level good.Support and encouragement given.

## 2019-12-30 NOTE — [facility] Group Notes (Signed)
LCSW Group Therapy Note  12/30/2019 1:00 PM  Type of Therapy/Topic:  Group Therapy:  Emotion Regulation  Participation Level:  None   Description of Group:   The purpose of this group is to assist patients in learning to regulate negative emotions and experience positive emotions. Patients will be guided to discuss ways in which they have been vulnerable to their negative emotions. These vulnerabilities will be juxtaposed with experiences of positive emotions or situations, and patients will be challenged to use positive emotions to combat negative ones. Special emphasis will be placed on coping with negative emotions in conflict situations, and patients will process healthy conflict resolution skills.  Therapeutic Goals: 1. Patient will identify two positive emotions or experiences to reflect on in order to balance out negative emotions 2. Patient will label two or more emotions that they find the most difficult to experience 3. Patient will demonstrate positive conflict resolution skills through discussion and/or role plays  Summary of Patient Progress: Patient was present in group and appeared to be attentive, however did not engage in group discussion.   Therapeutic Modalities:   Cognitive Behavioral Therapy Feelings Identification Dialectical Behavioral Therapy  Assunta Curtis, MSW, LCSW 12/30/2019 2:46 PM

## 2019-12-30 NOTE — Progress Notes (Signed)
Recreation Therapy Notes  Date: 12/30/2019  Time: 9:30 am  Location: Craft Room  Behavioral response: Appropriate   Intervention Topic: Communication    Discussion/Intervention:  Group content today was focused on communication. The group defined communication and ways to communicate with others. Individuals stated reason why communication is important and some reasons to communicate with others. Patients expressed if they thought they were good at communicating with others and ways they could improve their communication skills. The group identified important parts of communication and some experiences they have had in the past with communication. The group participated in the intervention "Words in a Bag", where they had a chance to test out their communication skills and identify ways to improve their communication techniques.  Clinical Observations/Feedback:  Patient came to group late due to unknown reasons.  Individual was social with peers and staff while participating in the intervention during group.  [name] LRT/CTRS            [name] 12/30/2019 12:30 PM

## 2019-12-31 ENCOUNTER — Inpatient Hospital Stay: Payer: No Typology Code available for payment source

## 2019-12-31 DIAGNOSIS — F332 Major depressive disorder, recurrent severe without psychotic features: Principal | ICD-10-CM

## 2019-12-31 MED ORDER — TRAZODONE HCL 100 MG PO TABS
200.0000 mg | ORAL_TABLET | Freq: Every day | ORAL | Status: DC
  Administered 2019-12-31 – 2020-01-01 (×2): 200 mg via ORAL
  Filled 2019-12-31 (×2): qty 2

## 2019-12-31 MED ORDER — IBUPROFEN 600 MG PO TABS
600.0000 mg | ORAL_TABLET | Freq: Four times a day (QID) | ORAL | Status: DC | PRN
  Administered 2020-01-01: 08:00:00 600 mg via ORAL
  Filled 2019-12-31: qty 1

## 2019-12-31 NOTE — [facility] Group Notes (Signed)
LCSW Group Therapy Notes  Date and Time: 12/31/19 1:00PM  Type of Therapy and Topic: Group Therapy: Healthy Vs. Unhealthy Coping Strategies  Participation Level: [facility] PARTICIPATION LEVEL: Active  Description of Group:  In this group, patients will be encouraged to explore their healthy and unhealthy coping strategics. Coping strategies are actions that we take to deal with stress, problems, or uncomfortable emotions in our daily lives. Each patient will be challenged to read some scenarios and discuss the unhealthy and healthy coping strategies within those scenarios. Also, each patient will be challenged to describe current healthy and unhealthy strategies that they use in their own lives and discuss the outcomes and barriers to those strategies. This group will be process-oriented, with patients participating in exploration of their own experiences as well as giving and receiving support and challenge from other group members.  Therapeutic Goals: 1. Patient will identify personal healthy and unhealthy coping strategies. 2. Patient will identify healthy and unhealthy coping strategies, in others, through scenarios.  3. Patient will identify expected outcomes of healthy and unhealthy coping strategies. 4. Patient will identify barriers to using healthy coping strategies.   Summary of Patient Progress:  The pt was able to identify unhealthy coping strategies that her currently uses. The patient committed to using one healthy coping strategy she learned today and implement it into her everyday life. The pt participated in the worksheet activity and was able to identify possible barriers.    Therapeutic Modalities:  Cognitive Behavioral Therapy Solution Focused Therapy Motivational Interviewing   Juanetta Beets, MSW, [place] Social Worker

## 2019-12-31 NOTE — Progress Notes (Signed)
Patient alert and oriented x 4, affect is blunted,  thoughts are organized and coherent, she was noted less anxious interacting appropriately with peers and staff, and she is less needy, she complained of pain and was  medicated per PRN orders. Patient denies SI/HI/AVH no distress noted she is receptive to staff, complaint with medication regimen, 15 minutes safety checks maintained.
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
## 2019-12-31 NOTE — Progress Notes (Signed)
[facility] MD Progress Note  12/31/2019 12:18 PM Katrina Page  MRN:  [id number] Subjective: Patient seen chart reviewed.  39 year old woman with a history of major depression recent suicide attempt.  Patient continues to report feeling depressed.  She says that she has suicidal ideation but "no different than usual".  Denies any plan.  Denies any psychotic symptoms.  Remains highly focused on somatic complaints complaining of pain in her sternal area.  Despite having standing orders for both nonsteroidals and narcotics she is asking for more pain medicine.  Patient was asking to be discharged today but remains anxious irritable and inappropriately impulsive with less than ideal insight. Principal Problem: Severe recurrent major depression without psychotic features ([place]) Diagnosis: Principal Problem:   Severe recurrent major depression without psychotic features ([place]) Active Problems:   MDD (major depressive disorder)   Suicide attempt by drug overdose ([place])   Chronic back pain   Nicotine abuse  Total Time spent with patient: 30 minutes  Past Psychiatric History: Patient has a past history of recurrent depression  Past Medical History:  Past Medical History:  Diagnosis Date  . Migraines     Past Surgical History:  Procedure Laterality Date  . CHOLECYSTECTOMY    . TUBAL LIGATION     Family History:  Family History  Problem Relation Age of Onset  . Hypertension Mother   . Diabetes Mother   . Stroke Mother   . Breast cancer Mother    Family Psychiatric  History: See previous Social History:  Social History   Substance and Sexual Activity  Alcohol Use Yes   Comment: 2-3 week.       Social History   Substance and Sexual Activity  Drug Use No    Social History   Socioeconomic History  . Marital status: Divorced    Spouse name: Not on file  . Number of children: Not on file  . Years of education: Not on file  . Highest education level: Not on file  Occupational History  .  Not on file  Tobacco Use  . Smoking status: Current Every Day Smoker    Packs/day: 0.50    Years: 20.00    Pack years: 10.00    Types: Cigarettes  . Smokeless tobacco: Never Used  Substance and Sexual Activity  . Alcohol use: Yes    Comment: 2-3 week.    . Drug use: No  . Sexual activity: Not on file  Other Topics Concern  . Not on file  Social History Narrative  . Not on file   Social Determinants of Health   Financial Resource Strain:   . Difficulty of Paying Living Expenses:   Food Insecurity:   . Worried About Charity fundraiser in the Last Year:   . Arboriculturist in the Last Year:   Transportation Needs:   . Film/video editor (Medical):   Marland Kitchen Lack of Transportation (Non-Medical):   Physical Activity:   . Days of Exercise per Week:   . Minutes of Exercise per Session:   Stress:   . Feeling of Stress :   Social Connections:   . Frequency of Communication with Friends and Family:   . Frequency of Social Gatherings with Friends and Family:   . Attends Religious Services:   . Active Member of Clubs or Organizations:   . Attends Archivist Meetings:   Marland Kitchen Marital Status:    Additional Social History:  Sleep: Poor  Appetite:  Fair  Current Medications: Current Facility-Administered Medications  Medication Dose Route Frequency Provider Last Rate Last Admin  . acetaminophen (TYLENOL) tablet 650 mg  650 mg Oral Q6H PRN Cristofano, Dorene Ar, MD   650 mg at 12/30/19 1234  . albuterol (PROVENTIL) (2.5 MG/3ML) 0.083% nebulizer solution 2.5 mg  2.5 mg Inhalation Q4H PRN Cristofano, Dorene Ar, MD   2.5 mg at 12/29/19 2306  . albuterol (VENTOLIN HFA) 108 (90 Base) MCG/ACT inhaler 1-2 puff  1-2 puff Inhalation Q4H PRN Cristofano, Dorene Ar, MD   2 puff at 12/30/19 1427  . alum & mag hydroxide-simeth (MAALOX/MYLANTA) 200-200-20 MG/5ML suspension 30 mL  30 mL Oral Q4H PRN Cristofano, Paul A, MD      . hydrOXYzine (ATARAX/VISTARIL) tablet  50 mg  50 mg Oral TID PRN Money, Lowry Ram, FNP   50 mg at 12/31/19 0821  . ibuprofen (ADVIL) tablet 600 mg  600 mg Oral Q6H PRN Roshawn Ayala T, MD      . magnesium hydroxide (MILK OF MAGNESIA) suspension 30 mL  30 mL Oral Daily PRN Cristofano, Paul A, MD      . nicotine (NICODERM CQ - dosed in mg/24 hours) patch 21 mg  21 mg Transdermal Daily Azlaan Isidore, Madie Reno, MD   21 mg at 12/31/19 [id number]  . oxyCODONE (Oxy IR/ROXICODONE) immediate release tablet 10 mg  10 mg Oral Q6H PRN Beautiful Pensyl, Madie Reno, MD   10 mg at 12/31/19 0821  . rivaroxaban (XARELTO) tablet 20 mg  20 mg Oral Q supper Syna Gad, Madie Reno, MD   20 mg at 12/30/19 1644  . traZODone (DESYREL) tablet 200 mg  200 mg Oral QHS Stefany Starace T, MD      . venlafaxine XR (EFFEXOR-XR) 24 hr capsule 75 mg  75 mg Oral Q breakfast Money, Lowry Ram, FNP   75 mg at 12/31/19 [id number]    Lab Results:  Results for orders placed or performed during the hospital encounter of 12/28/19 (from the past 48 hour(s))  CBC     Status: Abnormal   Collection Time: 12/30/19  7:09 AM  Result Value Ref Range   WBC 7.5 4.0 - 10.5 K/uL   RBC 3.53 (L) 3.87 - 5.11 MIL/uL   Hemoglobin 7.8 (L) 12.0 - 15.0 g/dL   HCT 28.6 (L) 36.0 - 46.0 %   MCV 81.0 80.0 - 100.0 fL   MCH 22.1 (L) 26.0 - 34.0 pg   MCHC 27.3 (L) 30.0 - 36.0 g/dL   RDW 21.1 (H) 11.5 - 15.5 %   Platelets 183 150 - 400 K/uL   nRBC 0.0 0.0 - 0.2 %    Comment: Performed at [facility], [address] [place], [place] [address]  Creatinine, serum     Status: None   Collection Time: 12/30/19  7:09 AM  Result Value Ref Range   Creatinine, Ser 0.83 0.44 - 1.00 mg/dL   GFR calc non Af Amer >60 >60 mL/min   GFR calc Af Amer >60 >60 mL/min    Comment: Performed at [facility], [address], [place], [place] [address]    Blood Alcohol level:  Lab Results  Component Value Date   ETH 270 (H) [id number]    Metabolic Disorder Labs: Lab Results  Component Value Date   HGBA1C 4.8 11/30/2018   MPG  91.06 11/30/2018   No results found for: PROLACTIN Lab Results  Component Value Date   CHOL 121 12/29/2019  TRIG 94 12/29/2019   HDL 53 12/29/2019   CHOLHDL 2.3 12/29/2019   VLDL 19 12/29/2019   LDLCALC 49 12/29/2019    Physical Findings: AIMS:  , ,  ,  ,    CIWA:    COWS:     Musculoskeletal: Strength & Muscle Tone: within normal limits Gait & Station: normal Patient leans: N/A  Psychiatric Specialty Exam: Physical Exam  Nursing note and vitals reviewed. Constitutional: She appears well-developed and well-nourished.  HENT:  Head: Normocephalic and atraumatic.  Eyes: Pupils are equal, round, and reactive to light. Conjunctivae are normal.  Cardiovascular: Regular rhythm and normal heart sounds.  Respiratory: Effort normal. No respiratory distress.  GI: Soft.  Musculoskeletal:        General: Normal range of motion.     Cervical back: Normal range of motion.  Neurological: She is alert.  Skin: Skin is warm and dry.  Psychiatric: Her speech is normal. Her mood appears anxious. Her affect is blunt. She is agitated. She is not aggressive. Thought content is not paranoid. Cognition and memory are normal. She expresses impulsivity and inappropriate judgment. She exhibits a depressed mood. She expresses suicidal ideation. She expresses no homicidal ideation. She expresses no suicidal plans.    Review of Systems  Constitutional: Negative.   HENT: Negative.   Eyes: Negative.   Respiratory: Negative.   Cardiovascular: Negative.   Gastrointestinal: Negative.   Musculoskeletal: Positive for arthralgias and back pain.  Skin: Negative.   Neurological: Negative.   Psychiatric/Behavioral: Positive for dysphoric mood. The patient is nervous/anxious.     Blood pressure (!) 145/78, pulse 72, temperature 98.1 F (36.7 C), temperature source Oral, resp. rate 18, height 5\' 9"  (1.753 m), weight 94.8 kg, SpO2 99 %.Body mass index is 30.86 kg/m.  General Appearance: Casual  Eye  Contact:  Fair  Speech:  Clear and Coherent  Volume:  Normal  Mood:  Anxious and Dysphoric  Affect:  Constricted  Thought Process:  Coherent  Orientation:  Full (Time, Place, and Person)  Thought Content:  Logical  Suicidal Thoughts:  Yes.  without intent/plan  Homicidal Thoughts:  No  Memory:  Immediate;   Fair Recent;   Fair Remote;   Fair  Judgement:  Impaired  Insight:  Shallow  Psychomotor Activity:  Normal  Concentration:  Concentration: Fair  Recall:  [employer] of Knowledge:  Fair  Language:  Fair  Akathisia:  No  Handed:  Right  AIMS (if indicated):     Assets:  Desire for Improvement Housing Resilience  ADL's:  Intact  Cognition:  WNL  Sleep:  Number of Hours: 6.5     Treatment Plan Summary: Daily contact with patient to assess and evaluate symptoms and progress in treatment, Medication management and Plan Patient continues to show signs of depression.  Tolerating medicine adequately.  Claims that she does not sleep well at night.  We will increase the trazodone to 200 mg.  Also I am going to switch the tramadol over to 600 mg of ibuprofen as needed.  I have ordered an x-ray of her sternum to see if that shows any thing more about why she is having so much pain although I expect this is probably still the effect of bruising.  Unlikely probably to change treatment plan.  Tried to work on reassuring the patient and helping her to relax but she stays irritable and dysphoric.  Alethia Berthold, MD 12/31/2019, 12:18 PM

## 2019-12-31 NOTE — Progress Notes (Signed)
Patient was in the day room upon arrival to the unit. Pt denies SI/HI/AVH. Pt stated her pain was 8/10 and it's from her son jumping on her back. Patient rated her anxiety 8/10 and was given PRN medication. Patient compliant with medication administration per MD orders. Patient preoccupied with getting out of here and was irritable with staff that she hasn't been let go. Patient given education, support and encouragement to be active in her treatment plan. Patient being monitored Q 15 minutes for safety per unit protocol. Patient remains safe on the unit.

## 2019-12-31 NOTE — Plan of Care (Signed)
Pt complaint with medication administration per MD orders  Problem: Medication: Goal: Compliance with prescribed medication regimen will improve Outcome: Progressing

## 2020-01-01 MED ORDER — TRAZODONE HCL 100 MG PO TABS: 200.0000 mg | ORAL_TABLET | Freq: Every day | ORAL | 0 refills | Status: DC

## 2020-01-01 MED ORDER — HYDROXYZINE HCL 50 MG PO TABS: 50.0000 mg | ORAL_TABLET | Freq: Three times a day (TID) | ORAL | 0 refills | Status: DC | PRN

## 2020-01-01 MED ORDER — VENLAFAXINE HCL ER 75 MG PO CP24: 75.0000 mg | ORAL_CAPSULE | Freq: Every day | ORAL | 0 refills | Status: DC

## 2020-01-01 MED ORDER — ALBUTEROL SULFATE HFA 108 (90 BASE) MCG/ACT IN AERS: 1.0000 | INHALATION_SPRAY | RESPIRATORY_TRACT | 0 refills | Status: DC | PRN

## 2020-01-01 MED ORDER — RIVAROXABAN 20 MG PO TABS: 20.0000 mg | ORAL_TABLET | Freq: Every day | ORAL | 0 refills | Status: DC

## 2020-01-01 NOTE — [facility] Group Notes (Signed)
[place] Group Notes:  (Nursing/MHT/Case Management/Adjunct)  Date:  01/01/2020  Time:  5:49 AM  Type of Therapy:  Group Therapy  Participation Level:  Did Not Attend    Katrina Page 01/01/2020, 5:49 AM

## 2020-01-01 NOTE — [facility] Group Notes (Signed)
LCSW Group Therapy Note  01/01/2020 1:00pm  Type of Therapy and Topic:  Group Therapy:  Cognitive Distortions  Participation Level:  Active   Description of Group:    Patients in this group will be introduced to the topic of cognitive distortions.  Patients will identify and describe cognitive distortions, describe the feelings these distortions create for them.  Patients will identify one or more situations in their personal life where they have cognitively distorted thinking and will verbalize challenging this cognitive distortion through positive thinking skills.  Patients will practice the skill of using positive affirmations to challenge cognitive distortions using affirmation cards.    Therapeutic Goals:  1. Patient will identify two or more cognitive distortions they have used 2. Patient will identify one or more emotions that stem from use of a cognitive distortion 3. Patient will demonstrate use of a positive affirmation to counter a cognitive distortion through discussion and/or role play. 4. Patient will describe one way cognitive distortions can be detrimental to wellness   Summary of Patient Progress:  The pt was able to identify multiple current cognitive distortions such as all or nothing thinking  and overgeneralization. The pt states willingness to challenge irrational thoughts by utilizing hand out. The pt is committed to attending follow up care to help with unhealthy thinking.     Therapeutic Modalities:   Cognitive Behavioral Therapy Motivational Interviewing   Juanetta Beets, MSW, [facility] Clinical Social Worker  01/01/2020 2:13 PM

## 2020-01-01 NOTE — Plan of Care (Signed)
D- Patient alert and oriented. Patient presents in a pleasant mood on assessment stating that she slept "better than I have" last night and continues to have complaints of pain. Patient rated her pain level a "8/10", in which she did request medication from this Probation officer. Patient endorsed both depression/anxiety, stating that "just life, I want to go home", is why she's feeling this way. Patient denies SI, HI, AVH at this time. Patient's goal for today is "going home".   A- Scheduled medications administered to patient, per MD orders. Support and encouragement provided.  Routine safety checks conducted every 15 minutes.  Patient informed to notify staff with problems or concerns.  R- No adverse drug reactions noted. Patient contracts for safety at this time. Patient compliant with medications and treatment plan. Patient receptive, calm, and cooperative. Patient interacts well with others on the unit.  Patient remains safe at this time.  Problem: Education: Goal: Utilization of techniques to improve thought processes will improve Outcome: Progressing Goal: Knowledge of the prescribed therapeutic regimen will improve Outcome: Progressing   Problem: Activity: Goal: Interest or engagement in leisure activities will improve Outcome: Progressing Goal: Imbalance in normal sleep/wake cycle will improve Outcome: Progressing   Problem: Coping: Goal: Coping ability will improve Outcome: Progressing Goal: Will verbalize feelings Outcome: Progressing   Problem: Coping: Goal: Coping ability will improve Outcome: Progressing   Problem: Medication: Goal: Compliance with prescribed medication regimen will improve Outcome: Progressing   Problem: Self-Concept: Goal: Ability to disclose and discuss suicidal ideas will improve Outcome: Progressing Goal: Will verbalize positive feelings about self Outcome: Progressing   Problem: Education: Goal: Knowledge of [facility] Education  information/materials will improve Outcome: Progressing Goal: Emotional status will improve Outcome: Progressing Goal: Mental status will improve Outcome: Progressing Goal: Verbalization of understanding the information provided will improve Outcome: Progressing

## 2020-01-01 NOTE — Progress Notes (Signed)
[facility] MD Progress Note  01/01/2020 11:20 AM MARQUILA BUECHE  MRN:  [id number] Subjective: Patient seen and chart reviewed.  39 year old woman with multiple life stresses and depression.  Patient is dysphoric today but chiefly blames that on her ongoing chest and back pain.  The sternal x-ray came back with no sign of fracture.  I explained to her that she can still have a lot of bruising to her cartilage and sternum that can cause pain like this but that she can probably be reassured that it will be gradually getting better.  Patient denies any suicidal thought.  She remains dysphoric but not psychotic and is able to formulate appropriate plans for managing her stress better at discharge.  She is tolerating her medicine well.  Patient is agreeable to outpatient treatment after discharge. Principal Problem: Severe recurrent major depression without psychotic features ([place]) Diagnosis: Principal Problem:   Severe recurrent major depression without psychotic features ([place]) Active Problems:   MDD (major depressive disorder)   Suicide attempt by drug overdose ([place])   Chronic back pain   Nicotine abuse  Total Time spent with patient: 30 minutes  Past Psychiatric History: Past history of depression  Past Medical History:  Past Medical History:  Diagnosis Date  . Migraines     Past Surgical History:  Procedure Laterality Date  . CHOLECYSTECTOMY    . TUBAL LIGATION     Family History:  Family History  Problem Relation Age of Onset  . Hypertension Mother   . Diabetes Mother   . Stroke Mother   . Breast cancer Mother    Family Psychiatric  History: See previous notes Social History:  Social History   Substance and Sexual Activity  Alcohol Use Yes   Comment: 2-3 week.       Social History   Substance and Sexual Activity  Drug Use No    Social History   Socioeconomic History  . Marital status: Divorced    Spouse name: Not on file  . Number of children: Not on file  . Years of  education: Not on file  . Highest education level: Not on file  Occupational History  . Not on file  Tobacco Use  . Smoking status: Current Every Day Smoker    Packs/day: 0.50    Years: 20.00    Pack years: 10.00    Types: Cigarettes  . Smokeless tobacco: Never Used  Substance and Sexual Activity  . Alcohol use: Yes    Comment: 2-3 week.    . Drug use: No  . Sexual activity: Not on file  Other Topics Concern  . Not on file  Social History Narrative  . Not on file   Social Determinants of Health   Financial Resource Strain:   . Difficulty of Paying Living Expenses:   Food Insecurity:   . Worried About Charity fundraiser in the Last Year:   . Arboriculturist in the Last Year:   Transportation Needs:   . Film/video editor (Medical):   Marland Kitchen Lack of Transportation (Non-Medical):   Physical Activity:   . Days of Exercise per Week:   . Minutes of Exercise per Session:   Stress:   . Feeling of Stress :   Social Connections:   . Frequency of Communication with Friends and Family:   . Frequency of Social Gatherings with Friends and Family:   . Attends Religious Services:   . Active Member of Clubs or Organizations:   . Attends  Club or Organization Meetings:   Marland Kitchen Marital Status:    Additional Social History:                         Sleep: Fair  Appetite:  Fair  Current Medications: Current Facility-Administered Medications  Medication Dose Route Frequency Provider Last Rate Last Admin  . acetaminophen (TYLENOL) tablet 650 mg  650 mg Oral Q6H PRN Cristofano, Dorene Ar, MD   650 mg at 12/31/19 1810  . albuterol (PROVENTIL) (2.5 MG/3ML) 0.083% nebulizer solution 2.5 mg  2.5 mg Inhalation Q4H PRN Cristofano, Dorene Ar, MD   2.5 mg at 12/29/19 2306  . albuterol (VENTOLIN HFA) 108 (90 Base) MCG/ACT inhaler 1-2 puff  1-2 puff Inhalation Q4H PRN Cristofano, Dorene Ar, MD   2 puff at 01/01/20 1023  . alum & mag hydroxide-simeth (MAALOX/MYLANTA) 200-200-20 MG/5ML suspension 30  mL  30 mL Oral Q4H PRN Cristofano, Paul A, MD      . hydrOXYzine (ATARAX/VISTARIL) tablet 50 mg  50 mg Oral TID PRN Money, Lowry Ram, FNP   50 mg at 01/01/20 0824  . ibuprofen (ADVIL) tablet 600 mg  600 mg Oral Q6H PRN Jazzman Loughmiller, Madie Reno, MD   600 mg at 01/01/20 0824  . magnesium hydroxide (MILK OF MAGNESIA) suspension 30 mL  30 mL Oral Daily PRN Cristofano, Paul A, MD      . nicotine (NICODERM CQ - dosed in mg/24 hours) patch 21 mg  21 mg Transdermal Daily Rosealyn Little, Madie Reno, MD   21 mg at 01/01/20 0825  . oxyCODONE (Oxy IR/ROXICODONE) immediate release tablet 10 mg  10 mg Oral Q6H PRN Ozzie Remmers, Madie Reno, MD   10 mg at 01/01/20 0519  . rivaroxaban (XARELTO) tablet 20 mg  20 mg Oral Q supper Josep Luviano, Madie Reno, MD   20 mg at 12/31/19 1705  . traZODone (DESYREL) tablet 200 mg  200 mg Oral QHS Keena Dinse T, MD   200 mg at 12/31/19 2134  . venlafaxine XR (EFFEXOR-XR) 24 hr capsule 75 mg  75 mg Oral Q breakfast Money, Lowry Ram, FNP   75 mg at 01/01/20 [id number]    Lab Results: No results found for this or any previous visit (from the past 48 hour(s)).  Blood Alcohol level:  Lab Results  Component Value Date   ETH 270 (H) Q000111Q    Metabolic Disorder Labs: Lab Results  Component Value Date   HGBA1C 4.8 11/30/2018   MPG 91.06 11/30/2018   No results found for: PROLACTIN Lab Results  Component Value Date   CHOL 121 12/29/2019   TRIG 94 12/29/2019   HDL 53 12/29/2019   CHOLHDL 2.3 12/29/2019   VLDL 19 12/29/2019   LDLCALC 49 12/29/2019    Physical Findings: AIMS:  , ,  ,  ,    CIWA:    COWS:     Musculoskeletal: Strength & Muscle Tone: within normal limits Gait & Station: normal Patient leans: N/A  Psychiatric Specialty Exam: Physical Exam  Nursing note and vitals reviewed. Constitutional: She appears well-developed and well-nourished.  HENT:  Head: Normocephalic and atraumatic.  Eyes: Pupils are equal, round, and reactive to light. Conjunctivae are normal.  Cardiovascular: Regular  rhythm and normal heart sounds.  Respiratory: Effort normal.  GI: Soft.  Musculoskeletal:        General: Normal range of motion.     Cervical back: Normal range of motion.  Neurological: She is alert.  Skin: Skin is warm  and dry.  Psychiatric: Judgment normal. Her affect is blunt. Her speech is delayed. She is slowed. Thought content is not paranoid. Cognition and memory are normal. She expresses no homicidal and no suicidal ideation.    Review of Systems  Constitutional: Negative.   HENT: Negative.   Eyes: Negative.   Respiratory: Negative.   Cardiovascular: Negative.   Gastrointestinal: Negative.   Musculoskeletal: Positive for arthralgias, back pain and myalgias.  Skin: Negative.   Neurological: Negative.   Psychiatric/Behavioral: Positive for dysphoric mood.    Blood pressure 138/85, pulse 96, temperature 98.5 F (36.9 C), temperature source Oral, resp. rate 17, height 5\' 9"  (1.753 m), weight 94.8 kg, SpO2 100 %.Body mass index is 30.86 kg/m.  General Appearance: Casual  Eye Contact:  Good  Speech:  Slow  Volume:  Decreased  Mood:  Dysphoric  Affect:  Congruent  Thought Process:  Goal Directed  Orientation:  Full (Time, Place, and Person)  Thought Content:  Logical  Suicidal Thoughts:  No  Homicidal Thoughts:  No  Memory:  Immediate;   Fair Recent;   Fair Remote;   Fair  Judgement:  Fair  Insight:  Fair  Psychomotor Activity:  Normal  Concentration:  Concentration: Fair  Recall:  [employer] of Knowledge:  Fair  Language:  Fair  Akathisia:  No  Handed:  Right  AIMS (if indicated):     Assets:  Desire for Improvement Housing Resilience  ADL's:  Intact  Cognition:  WNL  Sleep:  Number of Hours: 6.5     Treatment Plan Summary: Daily contact with patient to assess and evaluate symptoms and progress in treatment, Medication management and Plan Stabilizing.  Still some depressive symptoms but probably inevitable given her life stresses.  We talked about what  she can do to manage them better outside the hospital including ways to probably get her son to move out.  No change to medicine for today.  We will plan for discharge tomorrow and I will make preparations to have a 7-day supply available by then.  Alethia Berthold, MD 01/01/2020, 11:20 AM

## 2020-01-01 NOTE — BHH Suicide Risk Assessment (Signed)
[facility] Discharge Suicide Risk Assessment   Principal Problem: Severe recurrent major depression without psychotic features [facility]) Discharge Diagnoses: Principal Problem:   Severe recurrent major depression without psychotic features ([place]) Active Problems:   MDD (major depressive disorder)   Suicide attempt by drug overdose ([place])   Chronic back pain   Nicotine abuse   Total Time spent with patient: 30 minutes  Musculoskeletal: Strength & Muscle Tone: within normal limits Gait & Station: normal Patient leans: N/A  Psychiatric Specialty Exam: Review of Systems  Constitutional: Negative.   HENT: Negative.   Eyes: Negative.   Respiratory: Negative.   Cardiovascular: Negative.   Gastrointestinal: Negative.   Musculoskeletal: Negative.   Skin: Negative.   Neurological: Negative.   Psychiatric/Behavioral: Positive for dysphoric mood. Negative for suicidal ideas.    Blood pressure 138/85, pulse 96, temperature 98.5 F (36.9 C), temperature source Oral, resp. rate 17, height 5\' 9"  (1.753 m), weight 94.8 kg, SpO2 100 %.Body mass index is 30.86 kg/m.  General Appearance: Casual  Eye Contact::  Fair  Speech:  Clear and [id number]  Volume:  Normal  Mood:  Dysphoric  Affect:  Congruent  Thought Process:  Coherent  Orientation:  Full (Time, Place, and Person)  Thought Content:  Logical  Suicidal Thoughts:  No  Homicidal Thoughts:  No  Memory:  Immediate;   Fair Recent;   Fair Remote;   Fair  Judgement:  Fair  Insight:  Fair  Psychomotor Activity:  Normal  Concentration:  Fair  Recall:  [employer]  Language: Fair  Akathisia:  No  Handed:  Right  AIMS (if indicated):     Assets:  Desire for Improvement Housing Resilience Social Support  Sleep:  Number of Hours: 6.5  Cognition: WNL  ADL's:  Intact   Mental Status Per Nursing Assessment::   On Admission:  Suicidal ideation indicated by patient, Suicide plan  Demographic Factors:  Caucasian and Low  socioeconomic status  Loss Factors: Financial problems/change in socioeconomic status  Historical Factors: Prior suicide attempts  Risk Reduction Factors:   Sense of responsibility to family and Living with another person, especially a relative  Continued Clinical Symptoms:  Depression:   Impulsivity  Cognitive Features That Contribute To Risk:  None    Suicide Risk:  Minimal: No identifiable suicidal ideation.  Patients presenting with no risk factors but with morbid ruminations; may be classified as minimal risk based on the severity of the depressive symptoms    Plan Of Care/Follow-up recommendations:  Activity:  Activity as tolerated Diet:  Regular diet Other:  Follow-up with outpatient treatment locally with mental health providers  Alethia Berthold, MD 01/01/2020, 11:29 AM

## 2020-01-02 LAB — CREATININE, SERUM
Creatinine, Ser: 0.94 mg/dL (ref 0.44–1.00)
GFR calc Af Amer: >60 mL/min (ref >60)
GFR calc non Af Amer: >60 mL/min (ref >60)

## 2020-01-02 LAB — CBC
HCT: 27.8 % — ABNORMAL LOW (ref 36.0–46.0)
Hemoglobin: 7.7 g/dL — ABNORMAL LOW (ref 12.0–15.0)
MCH: 22.3 pg — ABNORMAL LOW (ref 26.0–34.0)
MCHC: 27.7 g/dL — ABNORMAL LOW (ref 30.0–36.0)
MCV: 80.3 fL (ref 80.0–100.0)
Platelets: 295 10*3/uL (ref 150–400)
RBC: 3.46 MIL/uL — ABNORMAL LOW (ref 3.87–5.11)
RDW: 20.6 % — ABNORMAL HIGH (ref 11.5–15.5)
WBC: 8.1 10*3/uL (ref 4.0–10.5)
nRBC: 0 % (ref 0.0–0.2)

## 2020-01-02 MED ORDER — HYDROXYZINE HCL 50 MG PO TABS: 50.0000 mg | ORAL_TABLET | Freq: Three times a day (TID) | ORAL | 1 refills | Status: DC | PRN

## 2020-01-02 MED ORDER — OXYCODONE HCL 10 MG PO TABS: 10.0000 mg | ORAL_TABLET | Freq: Four times a day (QID) | ORAL | 0 refills | Status: DC | PRN

## 2020-01-02 MED ORDER — ALBUTEROL SULFATE HFA 108 (90 BASE) MCG/ACT IN AERS: 1.0000 | INHALATION_SPRAY | RESPIRATORY_TRACT | 1 refills | Status: AC | PRN

## 2020-01-02 MED ORDER — TRAZODONE HCL 100 MG PO TABS: 200.0000 mg | ORAL_TABLET | Freq: Every day | ORAL | 1 refills | Status: DC

## 2020-01-02 MED ORDER — VENLAFAXINE HCL ER 75 MG PO CP24: 75.0000 mg | ORAL_CAPSULE | Freq: Every day | ORAL | 1 refills | Status: DC

## 2020-01-02 MED ORDER — RIVAROXABAN 20 MG PO TABS: 20.0000 mg | ORAL_TABLET | Freq: Every day | ORAL | 1 refills | Status: DC

## 2020-01-02 NOTE — Progress Notes (Signed)
Patient ID: Katrina Page, female   DOB: 1981/05/22, 39 y.o.   MRN: [id number]  Discharge Note:  Patient denies SI/HIAVH at this time. Discharge instructions, AVS, prescriptions, transition record, and seven-day supply gone over with patient. Patient agrees to comply with medication management, follow-up visit, and outpatient therapy. Patient belongings returned to patient. Patient questions and concerns addressed and answered. Patient ambulatory off unit. Patient discharged to home via [employer], transportation services.

## 2020-01-02 NOTE — Plan of Care (Signed)
  Problem: Coping Skills Goal: STG - Patient will identify 3 positive coping skills strategies to use for SI post d/c within 5 recreation therapy group sessions Description: STG - Patient will identify 3 positive coping skills strategies to use for SI post d/c within 5 recreation therapy group sessions 01/02/2020 1452 by Ernest Haber, LRT Outcome: Adequate for Discharge 01/02/2020 1452 by Ernest Haber, LRT Outcome: Adequate for Discharge

## 2020-01-02 NOTE — Progress Notes (Signed)
Recreation Therapy Notes  Date: 01/02/2020  Time: 9:30 am   Location: Craft room   Behavioral response: N/A   Intervention Topic: Goals    Discussion/Intervention: Patient did not attend group.   Clinical Observations/Feedback:  Patient did not attend group.   Yunus Stoklosa LRT/CTRS         Brandt Chaney 01/02/2020 11:59 AM

## 2020-01-02 NOTE — Progress Notes (Signed)
D- Patient alert and oriented. Patient presents in an anxious, but pleasant mood on assessment stating that she woke up once last night. Patient continues to endorse chest pain, rating her pain a "10/10", in which she did request pain medication from this Probation officer. Patient denies depression, however, she endorsed anxiety, stating that she's "anxious to go home". Patient also denies SI, HI, AVH at this time. Patient's goal for today is to "go home", in which she will "do better at life", in order to achieve her goal.  A- Scheduled medications administered to patient, per MD orders. Support and encouragement provided.  Routine safety checks conducted every 15 minutes.  Patient informed to notify staff with problems or concerns.  R- No adverse drug reactions noted. Patient contracts for safety at this time. Patient compliant with medications and treatment plan. Patient receptive, calm, and cooperative. Patient interacts well with others on the unit.  Patient remains safe at this time.

## 2020-01-02 NOTE — Discharge Summary (Signed)
Physician Discharge Summary Note  Patient:  Katrina Page is an 39 y.o., female MRN:  [id number] DOB:  10/14/1980 Patient phone:  [phone] (home)  Patient address:   [place] [address],  Total Time spent with patient: 30 minutes  Date of Admission:  12/28/2019 Date of Discharge: January 02, 2020  Reason for Admission: Patient was admitted for follow-up after stabilization of the results of an intentional overdose with suicidal ideation  Principal Problem: Severe recurrent major depression without psychotic features [facility]) Discharge Diagnoses: Principal Problem:   Severe recurrent major depression without psychotic features ([place]) Active Problems:   MDD (major depressive disorder)   Suicide attempt by drug overdose ([place])   Chronic back pain   Nicotine abuse   Past Psychiatric History: History of recurrent severe depression multiple major life stresses recent worsening of suicidal ideation  Past Medical History:  Past Medical History:  Diagnosis Date  . Migraines     Past Surgical History:  Procedure Laterality Date  . CHOLECYSTECTOMY    . TUBAL LIGATION     Family History:  Family History  Problem Relation Age of Onset  . Hypertension Mother   . Diabetes Mother   . Stroke Mother   . Breast cancer Mother    Family Psychiatric  History: See previous.  Positive for depression Social History:  Social History   Substance and Sexual Activity  Alcohol Use Yes   Comment: 2-3 week.       Social History   Substance and Sexual Activity  Drug Use No    Social History   Socioeconomic History  . Marital status: Divorced    Spouse name: Not on file  . Number of children: Not on file  . Years of education: Not on file  . Highest education level: Not on file  Occupational History  . Not on file  Tobacco Use  . Smoking status: Current Every Day Smoker    Packs/day: 0.50    Years: 20.00    Pack years: 10.00    Types: Cigarettes  . Smokeless  tobacco: Never Used  Substance and Sexual Activity  . Alcohol use: Yes    Comment: 2-3 week.    . Drug use: No  . Sexual activity: Not on file  Other Topics Concern  . Not on file  Social History Narrative  . Not on file   Social Determinants of Health   Financial Resource Strain:   . Difficulty of Paying Living Expenses:   Food Insecurity:   . Worried About Charity fundraiser in the Last Year:   . Arboriculturist in the Last Year:   Transportation Needs:   . Film/video editor (Medical):   Marland Kitchen Lack of Transportation (Non-Medical):   Physical Activity:   . Days of Exercise per Week:   . Minutes of Exercise per Session:   Stress:   . Feeling of Stress :   Social Connections:   . Frequency of Communication with Friends and Family:   . Frequency of Social Gatherings with Friends and Family:   . Attends Religious Services:   . Active Member of Clubs or Organizations:   . Attends Archivist Meetings:   Marland Kitchen Marital Status:     Hospital Course: Patient admitted to the psychiatric unit.  Patient continued to be depressed down negative blunted.  She denied suicidal ideation or plan but still remained hopeless.  Medications were adjusted during her hospital stay and  the patient was daily engaged in individual and group therapy and assessment.  Gradually mood showed some improvement although at the time of discharge she was still somewhat blunted and dysphoric.  She was however able to articulate a better positive plan for self-care at home.  No sign of psychosis at discharge.  She agreed to follow-up treatment after leaving the hospital and to work on controlling alcohol and drug use.  Physical Findings: AIMS:  , ,  ,  ,    CIWA:    COWS:     Musculoskeletal: Strength & Muscle Tone: within normal limits Gait & Station: normal Patient leans: N/A  Psychiatric Specialty Exam: Physical Exam  Nursing note and vitals reviewed. Constitutional: She appears well-developed  and well-nourished.  HENT:  Head: Normocephalic and atraumatic.  Eyes: Pupils are equal, round, and reactive to light. Conjunctivae are normal.  Cardiovascular: Regular rhythm and normal heart sounds.  Respiratory: Effort normal.  GI: Soft.  Musculoskeletal:        General: Normal range of motion.     Cervical back: Normal range of motion.  Neurological: She is alert.  Skin: Skin is warm and dry.  Psychiatric: Judgment normal. Her affect is blunt. Her speech is delayed. She is slowed. Cognition and memory are normal. She expresses no homicidal and no suicidal ideation.    Review of Systems  Constitutional: Negative.   HENT: Negative.   Eyes: Negative.   Respiratory: Negative.   Cardiovascular: Negative.   Gastrointestinal: Negative.   Musculoskeletal: Negative.   Skin: Negative.   Neurological: Negative.   Psychiatric/Behavioral: Positive for dysphoric mood. Negative for suicidal ideas.    Blood pressure 138/90, pulse 88, temperature 98.6 F (37 C), temperature source Oral, resp. rate 18, height 5\' 9"  (1.753 m), weight 94.8 kg, SpO2 100 %.Body mass index is 30.86 kg/m.  General Appearance: Casual  Eye Contact:  Good  Speech:  Slow  Volume:  Decreased  Mood:  Depressed  Affect:  Congruent  Thought Process:  Goal Directed  Orientation:  Full (Time, Place, and Person)  Thought Content:  Logical  Suicidal Thoughts:  No  Homicidal Thoughts:  No  Memory:  Immediate;   Fair Recent;   Fair Remote;   Fair  Judgement:  Fair  Insight:  Fair  Psychomotor Activity:  Normal  Concentration:  Concentration: Fair  Recall:  [place] of Knowledge:  Fair  Language:  Fair  Akathisia:  No  Handed:  Right  AIMS (if indicated):     Assets:  Desire for Improvement Housing Resilience  ADL's:  Intact  Cognition:  WNL  Sleep:  Number of Hours: 6.5     Have you used any form of tobacco in the last 30 days? (Cigarettes, Smokeless Tobacco, Cigars, and/or Pipes): Yes  Has this  patient used any form of tobacco in the last 30 days? (Cigarettes, Smokeless Tobacco, Cigars, and/or Pipes) Yes, Yes, A prescription for an FDA-approved tobacco cessation medication was offered at discharge and the patient refused  Blood Alcohol level:  Lab Results  Component Value Date   ETH 270 (H) [id number]    Metabolic Disorder Labs:  Lab Results  Component Value Date   HGBA1C 4.8 11/30/2018   MPG 91.06 11/30/2018   No results found for: PROLACTIN Lab Results  Component Value Date   CHOL 121 12/29/2019   TRIG 94 12/29/2019   HDL 53 12/29/2019   CHOLHDL 2.3 12/29/2019   VLDL 19 12/29/2019   LDLCALC 49 12/29/2019  See Psychiatric Specialty Exam and Suicide Risk Assessment completed by Attending Physician prior to discharge.  Discharge destination:  Home  Is patient on multiple antipsychotic therapies at discharge:  No   Has Patient had three or more failed trials of antipsychotic monotherapy by history:  No  Recommended Plan for Multiple Antipsychotic Therapies: NA  Discharge Instructions    Diet - low sodium heart healthy   Complete by: As directed    Increase activity slowly   Complete by: As directed      Allergies as of 01/02/2020   No Known Allergies     Medication List    STOP taking these medications   acetaminophen 325 MG tablet Commonly known as: TYLENOL   ketorolac 10 MG tablet Commonly known as: TORADOL     TAKE these medications     Indication  albuterol 108 (90 Base) MCG/ACT inhaler Commonly known as: VENTOLIN HFA Inhale 1-2 puffs into the lungs every 4 (four) hours as needed for wheezing or shortness of breath. What changed: how much to take  Indication: Asthma, Chronic Obstructive Lung Disease   hydrOXYzine 50 MG tablet Commonly known as: ATARAX/VISTARIL Take 1 tablet (50 mg total) by mouth 3 (three) times daily as needed for anxiety.  Indication: Feeling Anxious   Oxycodone HCl 10 MG Tabs Take 1 tablet (10 mg total) by mouth  every 6 (six) hours as needed (moderate to severe pain).  Indication: Chronic Pain   rivaroxaban 20 MG Tabs tablet Commonly known as: XARELTO Take 1 tablet (20 mg total) by mouth daily with supper.  Indication: Blockage of Blood Vessel to Lung by a Particle   traZODone 100 MG tablet Commonly known as: DESYREL Take 2 tablets (200 mg total) by mouth at bedtime.  Indication: Trouble Sleeping   venlafaxine XR 75 MG 24 hr capsule Commonly known as: EFFEXOR-XR Take 1 capsule (75 mg total) by mouth daily with breakfast.  Indication: Major Depressive Disorder      Follow-up Information    [place] Follow up.   Why: You are scheduled to meet with Sherrian Divers, peer support specialist on Monday, April 19th at 7 am. Thank you. Contact information: [place] [address] [phone]           Follow-up recommendations:  Activity:  Activity as tolerated Diet:  Regular diet Other:  Follow-up with [facility]  Comments: Patient continues to agree to outpatient treatment.  Prescriptions given at discharge  Signed: Alethia Berthold, MD 01/02/2020, 6:43 PM

## 2020-01-02 NOTE — Progress Notes (Signed)
Recreation Therapy Notes  INPATIENT RECREATION TR PLAN  Patient Details Name: Katrina Page MRN: [id number] DOB: Jan 01, 1981 Today's Date: 01/02/2020  Rec Therapy Plan Is patient appropriate for Therapeutic Recreation?: Yes Treatment times per week: At least 3 Estimated Length of Stay: 5-7 days TR Treatment/Interventions: Group participation (Comment)  Discharge Criteria Pt will be discharged from therapy if:: Discharged Treatment plan/goals/alternatives discussed and agreed upon by:: Patient/family  Discharge Summary Short term goals set: Patient will identify 3 positive coping skills strategies to use for SI post d/c within 5 recreation therapy group sessions Short term goals met: Adequate for discharge Progress toward goals comments: Groups attended Which groups?: Communication Reason goals not met: N/A Therapeutic equipment acquired: N/A Reason patient discharged from therapy: Discharge from hospital Pt/family agrees with progress & goals achieved: Yes Date patient discharged from therapy: 01/02/20   Tayari Yankee 01/02/2020, 2:53 PM

## 2020-01-02 NOTE — Progress Notes (Signed)
  [facility] Adult Case Management Discharge Plan :  Will you be returning to the same living situation after discharge:  Yes,  lives with family member At discharge, do you have transportation home?: Yes,  safe transport Do you have the ability to pay for your medications: No.  Release of information consent forms completed and in the chart;  Patient's signature needed at discharge.  Patient to Follow up at: Follow-up Information    [place] Follow up.   Why: You are scheduled to meet with Sherrian Divers, peer support specialist on Monday, April 19th at 7 am. Thank you. Contact information: [place] [address] [phone]           Next level of care provider has access to [place] and Suicide Prevention discussed: Yes,  with pt; declined family contact  Have you used any form of tobacco in the last 30 days? (Cigarettes, Smokeless Tobacco, Cigars, and/or Pipes): Yes  Has patient been referred to the Quitline?: Patient refused referral  Patient has been referred for addiction treatment: N/A  Yvette Rack, LCSW 01/02/2020, 10:40 AM

## 2020-01-14 NOTE — Progress Notes (Unsigned)
[place]  [phone] [phone]) [phone]  ID: Katrina Page OB: 1981-05-29  MR#: [id number]  [id number]  Patient Care Team: Patient, No Pcp Per as PCP - General (General Practice)  I connected with [name] on 01/14/20 at  2:30 PM EDT by {Blank single:19197::"video enabled telemedicine visit","telephone visit"} and verified that I am speaking with the correct person using two identifiers.   I discussed the limitations, risks, security and privacy concerns of performing an evaluation and management service by telemedicine and the availability of in-person appointments. I also discussed with the patient that there may be a patient responsible charge related to this service. The patient expressed understanding and agreed to proceed.   Other persons participating in the visit and their role in the encounter: Patient, MD.  Patient's location: Home. Provider's location: Clinic.  CHIEF COMPLAINT: Bilateral pulmonary embolism, lung nodule.  INTERVAL HISTORY: Patient agreed to video enabled telemedicine visit for further evaluation and discussion of her PET scan results.  She continues to complain of pain, but otherwise feels well.  She has no neurologic complaints.  She denies any recent fevers or illnesses.  She has a good appetite and denies weight loss.  She denies any chest pain, shortness of breath, cough, or hemoptysis. She has no nausea, vomiting, constipation, or diarrhea.  She has no urinary complaints.  Patient offers no further specific complaints today.  REVIEW OF SYSTEMS:   Review of Systems  Constitutional: Negative.  Negative for fever, malaise/fatigue and weight loss.  Respiratory: Negative.  Negative for cough, hemoptysis and shortness of breath.   Cardiovascular: Negative.  Negative for chest pain and leg swelling.  Gastrointestinal: Negative.  Negative for abdominal pain.  Genitourinary: Positive for flank pain. Negative for  dysuria and hematuria.  Musculoskeletal: Positive for back pain, joint pain and myalgias.  Skin: Negative.  Negative for rash.  Neurological: Negative.  Negative for dizziness, focal weakness, weakness and headaches.  Psychiatric/Behavioral: Negative.  The patient is not nervous/anxious.     As per HPI. Otherwise, a complete review of systems is negative.  PAST MEDICAL HISTORY: Past Medical History:  Diagnosis Date  . Migraines     PAST SURGICAL HISTORY: Past Surgical History:  Procedure Laterality Date  . CHOLECYSTECTOMY    . TUBAL LIGATION      FAMILY HISTORY: Family History  Problem Relation Age of Onset  . Hypertension Mother   . Diabetes Mother   . Stroke Mother   . Breast cancer Mother     ADVANCED DIRECTIVES (Y/N):  N  HEALTH MAINTENANCE: Social History   Tobacco Use  . Smoking status: Current Every Day Smoker    Packs/day: 0.50    Years: 20.00    Pack years: 10.00    Types: Cigarettes  . Smokeless tobacco: Never Used  Substance Use Topics  . Alcohol use: Yes    Comment: 2-3 week.    . Drug use: No     Colonoscopy:  PAP:  Bone density:  Lipid panel:  No Known Allergies  Current Outpatient Medications  Medication Sig Dispense Refill  . albuterol (VENTOLIN HFA) 108 (90 Base) MCG/ACT inhaler Inhale 1-2 puffs into the lungs every 4 (four) hours as needed for wheezing or shortness of breath. 6.7 g 1  . hydrOXYzine (ATARAX/VISTARIL) 50 MG tablet Take 1 tablet (50 mg total) by mouth 3 (three) times daily as needed for anxiety. 60 tablet 1  . Oxycodone HCl 10 MG TABS Take 1 tablet (10 mg total)  by mouth every 6 (six) hours as needed (moderate to severe pain). 12 tablet 0  . rivaroxaban (XARELTO) 20 MG TABS tablet Take 1 tablet (20 mg total) by mouth daily with supper. 30 tablet 1  . traZODone (DESYREL) 100 MG tablet Take 2 tablets (200 mg total) by mouth at bedtime. 60 tablet 1  . venlafaxine XR (EFFEXOR-XR) 75 MG 24 hr capsule Take 1 capsule (75 mg  total) by mouth daily with breakfast. 30 capsule 1   No current facility-administered medications for this visit.    OBJECTIVE: There were no vitals filed for this visit.   There is no height or weight on file to calculate BMI.    ECOG FS:0 - Asymptomatic  General: Well-developed, well-nourished, no acute distress. HEENT: Normocephalic. Neuro: Alert, answering all questions appropriately. Cranial nerves grossly intact. Psych: Normal affect.   LAB RESULTS:  Lab Results  Component Value Date   NA 143 12/24/2019   K 2.9 (L) 12/24/2019   CL 104 12/24/2019   CO2 27 12/24/2019   GLUCOSE 135 (H) 12/24/2019   BUN <5 (L) 12/24/2019   CREATININE 0.94 01/02/2020   CALCIUM 8.7 (L) 12/24/2019   PROT 7.1 12/29/2019   ALBUMIN 3.6 12/29/2019   AST 11 (L) 12/29/2019   ALT 9 12/29/2019   ALKPHOS 48 12/29/2019   BILITOT 0.6 12/29/2019   GFRNONAA >60 01/02/2020   GFRAA >60 01/02/2020    Lab Results  Component Value Date   WBC 8.1 01/02/2020   NEUTROABS 8.2 (H) 07/23/2019   HGB 7.7 (L) 01/02/2020   HCT 27.8 (L) 01/02/2020   MCV 80.3 01/02/2020   PLT 295 01/02/2020     STUDIES: DG Chest 2 View  Result Date: 12/24/2019 CLINICAL DATA:  Chest pain EXAM: CHEST - 2 VIEW COMPARISON:  July 23, 2019 FINDINGS: There is atelectatic change in the right mid lung and right base regions. Lungs elsewhere are clear. Heart size and pulmonary vascularity are normal. No adenopathy. No bone lesions. IMPRESSION: Right mid lung and right base atelectatic change. Lungs elsewhere clear. Cardiac silhouette normal. Electronically Signed   By: Lowella Grip III M.D.   On: 12/24/2019 16:11   DG Sternum  Result Date: 12/31/2019 CLINICAL DATA:  Midsternal chest pain radiating to back and left shoulder 1 week. Minor trauma. EXAM: STERNUM - 2+ VIEW COMPARISON:  12/24/2019 FINDINGS: Lungs are adequately inflated without consolidation or effusion. No pneumothorax. Cardiomediastinal silhouette is normal. No  evidence of fracture. IMPRESSION: No acute findings. Electronically Signed   By: Marin Olp M.D.   On: 12/31/2019 16:40    ASSESSMENT: Bilateral pulmonary embolism, lung nodule.  PLAN:    1.  Bilateral pulmonary embolism: Patient did not appear to have any transient risk factors, although obesity, tobacco use, and sedentary lifestyle do increase her risk.  Full hypercoagulable work-up is negative.  Patient is tolerating Xarelto well without significant side effects.  Continue treatment for a total of 6 months completing in April 2020.  Patient will follow up at the end of April for further evaluation and likely discontinuation of treatment. 2. Right lung nodule: CT scan results from July 23, 2019 reviewed independently with a 1.8 cm lesion in the right anterior lung that was not present on imaging in March 2020.  PET scan results from August 23, 2019 reviewed independently and report as above with complete resolution of nodule and no other suspicious lesions.  Left adrenal nodule is not FDG avid and likely a benign adrenal adenoma.  No  further imaging is necessary at this time. 3.  Kidney stones: Continue follow-up with urology as indicated.   I provided *** minutes of {Blank single:19197::"face-to-face video visit time","non face-to-face telephone visit time"} during this encounter which included chart review, counseling, and coordination of care as documented above.   Patient expressed understanding and was in agreement with this plan. She also understands that She can call clinic at any time with any questions, concerns, or complaints.    Lloyd Huger, MD   01/14/2020 7:06 AM

## 2020-01-16 ENCOUNTER — Telehealth: Payer: Self-pay | Admitting: Emergency Medicine

## 2020-01-16 ENCOUNTER — Inpatient Hospital Stay: Payer: Self-pay | Attending: Oncology | Admitting: Oncology

## 2020-01-16 ENCOUNTER — Other Ambulatory Visit: Payer: Self-pay

## 2020-01-16 DIAGNOSIS — I2694 Multiple subsegmental pulmonary emboli without acute cor pulmonale: Secondary | ICD-10-CM

## 2020-01-16 NOTE — Telephone Encounter (Signed)
Called patient to go over chart prior to virtual visit. No answer, left message.

## 2020-02-08 ENCOUNTER — Encounter: Payer: Self-pay | Admitting: Emergency Medicine

## 2020-02-08 ENCOUNTER — Inpatient Hospital Stay
Admission: EM | Admit: 2020-02-08 | Discharge: 2020-02-12 | DRG: 660 | Disposition: A | Discharge status: Home / Self Care | Payer: Self-pay | Attending: Internal Medicine | Admitting: Internal Medicine

## 2020-02-08 ENCOUNTER — Other Ambulatory Visit: Payer: Self-pay

## 2020-02-08 DIAGNOSIS — Z79899 Other long term (current) drug therapy: Secondary | ICD-10-CM

## 2020-02-08 DIAGNOSIS — D638 Anemia in other chronic diseases classified elsewhere: Secondary | ICD-10-CM | POA: Diagnosis present

## 2020-02-08 DIAGNOSIS — F17210 Nicotine dependence, cigarettes, uncomplicated: Secondary | ICD-10-CM | POA: Diagnosis present

## 2020-02-08 DIAGNOSIS — Z915 Personal history of self-harm: Secondary | ICD-10-CM

## 2020-02-08 DIAGNOSIS — R112 Nausea with vomiting, unspecified: Secondary | ICD-10-CM | POA: Diagnosis present

## 2020-02-08 DIAGNOSIS — D696 Thrombocytopenia, unspecified: Secondary | ICD-10-CM

## 2020-02-08 DIAGNOSIS — N139 Obstructive and reflux uropathy, unspecified: Secondary | ICD-10-CM

## 2020-02-08 DIAGNOSIS — D62 Acute posthemorrhagic anemia: Secondary | ICD-10-CM | POA: Diagnosis not present

## 2020-02-08 DIAGNOSIS — E876 Hypokalemia: Secondary | ICD-10-CM

## 2020-02-08 DIAGNOSIS — F419 Anxiety disorder, unspecified: Secondary | ICD-10-CM | POA: Diagnosis present

## 2020-02-08 DIAGNOSIS — F1010 Alcohol abuse, uncomplicated: Secondary | ICD-10-CM | POA: Diagnosis present

## 2020-02-08 DIAGNOSIS — Z7901 Long term (current) use of anticoagulants: Secondary | ICD-10-CM

## 2020-02-08 DIAGNOSIS — Z79891 Long term (current) use of opiate analgesic: Secondary | ICD-10-CM

## 2020-02-08 DIAGNOSIS — Z86711 Personal history of pulmonary embolism: Secondary | ICD-10-CM

## 2020-02-08 DIAGNOSIS — E278 Other specified disorders of adrenal gland: Secondary | ICD-10-CM

## 2020-02-08 DIAGNOSIS — Z8249 Family history of ischemic heart disease and other diseases of the circulatory system: Secondary | ICD-10-CM

## 2020-02-08 DIAGNOSIS — Z20822 Contact with and (suspected) exposure to covid-19: Secondary | ICD-10-CM | POA: Diagnosis present

## 2020-02-08 DIAGNOSIS — I10 Essential (primary) hypertension: Secondary | ICD-10-CM | POA: Diagnosis present

## 2020-02-08 DIAGNOSIS — B951 Streptococcus, group B, as the cause of diseases classified elsewhere: Secondary | ICD-10-CM | POA: Diagnosis present

## 2020-02-08 DIAGNOSIS — N202 Calculus of kidney with calculus of ureter: Principal | ICD-10-CM | POA: Diagnosis present

## 2020-02-08 DIAGNOSIS — Z7289 Other problems related to lifestyle: Secondary | ICD-10-CM

## 2020-02-08 DIAGNOSIS — N200 Calculus of kidney: Secondary | ICD-10-CM

## 2020-02-08 DIAGNOSIS — I160 Hypertensive urgency: Secondary | ICD-10-CM | POA: Diagnosis present

## 2020-02-08 DIAGNOSIS — Z87442 Personal history of urinary calculi: Secondary | ICD-10-CM

## 2020-02-08 DIAGNOSIS — N3090 Cystitis, unspecified without hematuria: Secondary | ICD-10-CM | POA: Diagnosis present

## 2020-02-08 DIAGNOSIS — F332 Major depressive disorder, recurrent severe without psychotic features: Secondary | ICD-10-CM | POA: Diagnosis present

## 2020-02-08 DIAGNOSIS — Z9049 Acquired absence of other specified parts of digestive tract: Secondary | ICD-10-CM

## 2020-02-08 DIAGNOSIS — E538 Deficiency of other specified B group vitamins: Secondary | ICD-10-CM | POA: Diagnosis present

## 2020-02-08 LAB — COMPREHENSIVE METABOLIC PANEL
ALT: 14 U/L (ref 0–44)
AST: 24 U/L (ref 15–41)
Albumin: 4.2 g/dL (ref 3.5–5.0)
Alkaline Phosphatase: 68 U/L (ref 38–126)
Anion gap: 16 — ABNORMAL HIGH (ref 5–15)
BUN: 10 mg/dL (ref 6–20)
CO2: 32 mmol/L (ref 22–32)
Calcium: 9.3 mg/dL (ref 8.9–10.3)
Chloride: 96 mmol/L — ABNORMAL LOW (ref 98–111)
Creatinine, Ser: 1.24 mg/dL — ABNORMAL HIGH (ref 0.44–1.00)
GFR calc Af Amer: >60 mL/min (ref >60)
GFR calc non Af Amer: 54 mL/min — ABNORMAL LOW (ref >60)
Glucose, Bld: 144 mg/dL — ABNORMAL HIGH (ref 70–99)
Potassium: 2.4 mmol/L — CL (ref 3.5–5.1)
Sodium: 144 mmol/L (ref 135–145)
Total Bilirubin: 2 mg/dL — ABNORMAL HIGH (ref 0.3–1.2)
Total Protein: 8.2 g/dL — ABNORMAL HIGH (ref 6.5–8.1)

## 2020-02-08 LAB — CBC
HCT: 30 % — ABNORMAL LOW (ref 36.0–46.0)
Hemoglobin: 8.6 g/dL — ABNORMAL LOW (ref 12.0–15.0)
MCH: 21.2 pg — ABNORMAL LOW (ref 26.0–34.0)
MCHC: 28.7 g/dL — ABNORMAL LOW (ref 30.0–36.0)
MCV: 74.1 fL — ABNORMAL LOW (ref 80.0–100.0)
Platelets: 108 10*3/uL — ABNORMAL LOW (ref 150–400)
RBC: 4.05 MIL/uL (ref 3.87–5.11)
RDW: 24.4 % — ABNORMAL HIGH (ref 11.5–15.5)
WBC: 13.9 10*3/uL — ABNORMAL HIGH (ref 4.0–10.5)
nRBC: 0 % (ref 0.0–0.2)

## 2020-02-08 LAB — LIPASE, BLOOD: Lipase: 49 U/L (ref 11–51)

## 2020-02-08 MED ORDER — ONDANSETRON 4 MG PO TBDP
4.0000 mg | ORAL_TABLET | Freq: Once | ORAL | Status: AC | PRN
  Administered 2020-02-08: 4 mg via ORAL
  Filled 2020-02-08 (×2): qty 1

## 2020-02-08 MED ORDER — PROMETHAZINE HCL 25 MG/ML IJ SOLN
12.5000 mg | Freq: Once | INTRAMUSCULAR | Status: AC
  Administered 2020-02-08: 12.5 mg via INTRAMUSCULAR
  Filled 2020-02-08: qty 1

## 2020-02-08 MED ORDER — SODIUM CHLORIDE 0.9% FLUSH
3.0000 mL | Freq: Once | INTRAVENOUS | Status: AC
  Administered 2020-02-09: 3 mL via INTRAVENOUS

## 2020-02-08 NOTE — ED Notes (Signed)
Pt asking for nausea meds. Pt declined zofran.

## 2020-02-08 NOTE — ED Notes (Signed)
Pt standing up again and not following directions by staff. Pt placed back in chair and told not to stand up as she states she feels as if she is going to pass out.

## 2020-02-08 NOTE — ED Notes (Signed)
Pt encouraged to stop going to the bathroom and chugging water as that her stomach is irritated. Pt now states she will take zofran.

## 2020-02-08 NOTE — ED Triage Notes (Signed)
Pt to ED from home c/o nausea and vomiting for 2 days, states emesis >20 times today.  Denies pain or diarrhea or fevers.  Patient states unable to keep anything down but requesting something to drink in triage.

## 2020-02-08 NOTE — ED Notes (Signed)
Pt requesting Ice chips again. Pt has not had anymore emesis since PO intake ceased, Advised pt again that she is NPO. Pt given a wet rag for her forehead.

## 2020-02-08 NOTE — ED Notes (Signed)
Pt states she feels like she is going to pass out. Pt has been ambulating in lobby. Water taken from pt and pt instructed to stay in the chair and not to get up. Pt asking for ice chips. Told pt she is NPO.

## 2020-02-08 NOTE — ED Notes (Signed)
Pt given ice chips to tolerate.

## 2020-02-08 NOTE — ED Notes (Signed)
First nurse note:pt to the er via ems for nausea and vomiting x 2days. Pt has a tingling sensation all over, no stroke symptoms. Pt does not tolerate BP well. Pt told EMS she was also vomiting blood. No abd pain, 12 lead unremarkable, NSR, 99% on room air, CBG 180, 97.6 oral, 148/98. Heart rate 93. Pt given zofran 4mg  IM.

## 2020-02-09 ENCOUNTER — Emergency Department: Payer: Self-pay

## 2020-02-09 ENCOUNTER — Other Ambulatory Visit: Payer: Self-pay

## 2020-02-09 ENCOUNTER — Inpatient Hospital Stay: Payer: Self-pay

## 2020-02-09 ENCOUNTER — Inpatient Hospital Stay: Payer: Self-pay | Admitting: Anesthesiology

## 2020-02-09 ENCOUNTER — Encounter
Admission: EM | Disposition: A | Discharge status: Home / Self Care | Payer: Self-pay | Source: Home / Self Care | Attending: Internal Medicine

## 2020-02-09 DIAGNOSIS — E876 Hypokalemia: Secondary | ICD-10-CM

## 2020-02-09 DIAGNOSIS — N139 Obstructive and reflux uropathy, unspecified: Secondary | ICD-10-CM

## 2020-02-09 DIAGNOSIS — N201 Calculus of ureter: Secondary | ICD-10-CM

## 2020-02-09 DIAGNOSIS — R112 Nausea with vomiting, unspecified: Secondary | ICD-10-CM | POA: Diagnosis present

## 2020-02-09 DIAGNOSIS — N390 Urinary tract infection, site not specified: Secondary | ICD-10-CM

## 2020-02-09 DIAGNOSIS — E278 Other specified disorders of adrenal gland: Secondary | ICD-10-CM

## 2020-02-09 DIAGNOSIS — N200 Calculus of kidney: Secondary | ICD-10-CM

## 2020-02-09 HISTORY — PX: CYSTOSCOPY WITH STENT PLACEMENT: SHX5790

## 2020-02-09 LAB — URINALYSIS, COMPLETE (UACMP) WITH MICROSCOPIC
Bacteria, UA: NONE SEEN
Bilirubin Urine: NEGATIVE
Glucose, UA: 50 mg/dL — AB
Ketones, ur: NEGATIVE mg/dL
Nitrite: NEGATIVE
Protein, ur: 100 mg/dL — AB
Specific Gravity, Urine: >1.046 — ABNORMAL HIGH (ref 1.005–1.030)
WBC, UA: >50 WBC/hpf — ABNORMAL HIGH (ref 0–5)
pH: 6 (ref 5.0–8.0)

## 2020-02-09 LAB — CBC
HCT: 25.5 % — ABNORMAL LOW (ref 36.0–46.0)
Hemoglobin: 7.1 g/dL — ABNORMAL LOW (ref 12.0–15.0)
MCH: 21.3 pg — ABNORMAL LOW (ref 26.0–34.0)
MCHC: 27.8 g/dL — ABNORMAL LOW (ref 30.0–36.0)
MCV: 76.6 fL — ABNORMAL LOW (ref 80.0–100.0)
Platelets: 74 10*3/uL — ABNORMAL LOW (ref 150–400)
RBC: 3.33 MIL/uL — ABNORMAL LOW (ref 3.87–5.11)
RDW: 24.8 % — ABNORMAL HIGH (ref 11.5–15.5)
WBC: 8.7 10*3/uL (ref 4.0–10.5)
nRBC: 0 % (ref 0.0–0.2)

## 2020-02-09 LAB — BASIC METABOLIC PANEL
Anion gap: 14 (ref 5–15)
BUN: 11 mg/dL (ref 6–20)
CO2: 29 mmol/L (ref 22–32)
Calcium: 8.3 mg/dL — ABNORMAL LOW (ref 8.9–10.3)
Chloride: 98 mmol/L (ref 98–111)
Creatinine, Ser: 0.99 mg/dL (ref 0.44–1.00)
GFR calc Af Amer: >60 mL/min (ref >60)
GFR calc non Af Amer: >60 mL/min (ref >60)
Glucose, Bld: 99 mg/dL (ref 70–99)
Potassium: 3 mmol/L — ABNORMAL LOW (ref 3.5–5.1)
Sodium: 141 mmol/L (ref 135–145)

## 2020-02-09 LAB — COMPREHENSIVE METABOLIC PANEL
ALT: 11 U/L (ref 0–44)
AST: 17 U/L (ref 15–41)
Albumin: 3.4 g/dL — ABNORMAL LOW (ref 3.5–5.0)
Alkaline Phosphatase: 54 U/L (ref 38–126)
Anion gap: 13 (ref 5–15)
BUN: 10 mg/dL (ref 6–20)
CO2: 30 mmol/L (ref 22–32)
Calcium: 8.3 mg/dL — ABNORMAL LOW (ref 8.9–10.3)
Chloride: 99 mmol/L (ref 98–111)
Creatinine, Ser: 1.01 mg/dL — ABNORMAL HIGH (ref 0.44–1.00)
GFR calc Af Amer: >60 mL/min (ref >60)
GFR calc non Af Amer: >60 mL/min (ref >60)
Glucose, Bld: 100 mg/dL — ABNORMAL HIGH (ref 70–99)
Potassium: 3 mmol/L — ABNORMAL LOW (ref 3.5–5.1)
Sodium: 142 mmol/L (ref 135–145)
Total Bilirubin: 1.6 mg/dL — ABNORMAL HIGH (ref 0.3–1.2)
Total Protein: 6.5 g/dL (ref 6.5–8.1)

## 2020-02-09 LAB — PROTIME-INR
INR: 1.2 (ref 0.8–1.2)
Prothrombin Time: 14.4 seconds (ref 11.4–15.2)

## 2020-02-09 LAB — HCG, QUANTITATIVE, PREGNANCY: hCG, Beta Chain, Quant, S: 1 m[IU]/mL (ref <5)

## 2020-02-09 LAB — HIV ANTIBODY (ROUTINE TESTING W REFLEX): HIV Screen 4th Generation wRfx: NONREACTIVE

## 2020-02-09 LAB — MAGNESIUM
Magnesium: 1.2 mg/dL — ABNORMAL LOW (ref 1.7–2.4)
Magnesium: 2.2 mg/dL (ref 1.7–2.4)

## 2020-02-09 LAB — APTT: aPTT: 28 seconds (ref 24–36)

## 2020-02-09 LAB — HEPARIN LEVEL (UNFRACTIONATED)
Heparin Unfractionated: <0.1 IU/mL — ABNORMAL LOW (ref 0.30–0.70)
Heparin Unfractionated: 0.18 IU/mL — ABNORMAL LOW (ref 0.30–0.70)

## 2020-02-09 LAB — SARS CORONAVIRUS 2 BY RT PCR (HOSPITAL ORDER, PERFORMED IN ~~LOC~~ HOSPITAL LAB): SARS Coronavirus 2: NEGATIVE

## 2020-02-09 SURGERY — CYSTOSCOPY, WITH STENT INSERTION
Anesthesia: General | Site: Ureter | Laterality: Left

## 2020-02-09 MED ORDER — ONDANSETRON HCL 4 MG PO TABS
4.0000 mg | ORAL_TABLET | Freq: Four times a day (QID) | ORAL | Status: DC | PRN
  Administered 2020-02-10 – 2020-02-11 (×2): 4 mg via ORAL
  Filled 2020-02-09: qty 1

## 2020-02-09 MED ORDER — LIDOCAINE HCL (CARDIAC) PF 100 MG/5ML IV SOSY
PREFILLED_SYRINGE | INTRAVENOUS | Status: DC | PRN
  Administered 2020-02-09 (×2): 100 mg via INTRAVENOUS

## 2020-02-09 MED ORDER — MORPHINE SULFATE (PF) 2 MG/ML IV SOLN
2.0000 mg | INTRAVENOUS | Status: DC | PRN
  Administered 2020-02-09 – 2020-02-12 (×10): 2 mg via INTRAVENOUS
  Filled 2020-02-09 (×9): qty 1

## 2020-02-09 MED ORDER — METOCLOPRAMIDE HCL 5 MG/ML IJ SOLN
INTRAMUSCULAR | Status: AC
  Administered 2020-02-09: 10 mg via INTRAVENOUS
  Filled 2020-02-09: qty 2

## 2020-02-09 MED ORDER — MAGNESIUM SULFATE 2 GM/50ML IV SOLN
2.0000 g | Freq: Once | INTRAVENOUS | Status: AC
  Administered 2020-02-09: 2 g via INTRAVENOUS
  Filled 2020-02-09: qty 50

## 2020-02-09 MED ORDER — ACETAMINOPHEN 10 MG/ML IV SOLN: 1000.0000 mg | Freq: Once | INTRAVENOUS | Status: AC

## 2020-02-09 MED ORDER — POTASSIUM CHLORIDE 10 MEQ/100ML IV SOLN
10.0000 meq | INTRAVENOUS | Status: AC
  Administered 2020-02-09 (×2): 10 meq via INTRAVENOUS
  Filled 2020-02-09 (×2): qty 100

## 2020-02-09 MED ORDER — DEXAMETHASONE SODIUM PHOSPHATE 10 MG/ML IJ SOLN
INTRAMUSCULAR | Status: DC | PRN
  Administered 2020-02-09: 10 mg via INTRAVENOUS

## 2020-02-09 MED ORDER — KETOROLAC TROMETHAMINE 30 MG/ML IJ SOLN: 30.0000 mg | Freq: Once | INTRAMUSCULAR | Status: AC

## 2020-02-09 MED ORDER — OXYCODONE HCL 5 MG PO TABS
10.0000 mg | ORAL_TABLET | Freq: Four times a day (QID) | ORAL | Status: DC | PRN
  Administered 2020-02-10 – 2020-02-12 (×7): 10 mg via ORAL
  Filled 2020-02-09 (×7): qty 2

## 2020-02-09 MED ORDER — MORPHINE SULFATE (PF) 4 MG/ML IV SOLN: 1.0000 mg | Freq: Once | INTRAVENOUS | Status: AC

## 2020-02-09 MED ORDER — MAGNESIUM HYDROXIDE 400 MG/5ML PO SUSP
30.0000 mL | Freq: Every day | ORAL | Status: DC | PRN
  Filled 2020-02-09: qty 30

## 2020-02-09 MED ORDER — SUGAMMADEX SODIUM 200 MG/2ML IV SOLN
INTRAVENOUS | Status: DC | PRN
  Administered 2020-02-09: 400 mg via INTRAVENOUS

## 2020-02-09 MED ORDER — SODIUM CHLORIDE 0.9 % IV SOLN: INTRAVENOUS | Status: DC

## 2020-02-09 MED ORDER — HEPARIN (PORCINE) 25000 UT/250ML-% IV SOLN
1550.0000 [IU]/h | INTRAVENOUS | Status: DC
  Administered 2020-02-09: 1400 [IU]/h via INTRAVENOUS
  Administered 2020-02-10: 05:00:00 1550 [IU]/h via INTRAVENOUS
  Filled 2020-02-09 (×2): qty 250

## 2020-02-09 MED ORDER — ONDANSETRON HCL 4 MG/2ML IJ SOLN
4.0000 mg | Freq: Four times a day (QID) | INTRAMUSCULAR | Status: DC | PRN
  Administered 2020-02-09: 4 mg via INTRAVENOUS
  Filled 2020-02-09 (×2): qty 2

## 2020-02-09 MED ORDER — TRAZODONE HCL 50 MG PO TABS: 25.0000 mg | ORAL_TABLET | Freq: Every evening | ORAL | Status: DC | PRN

## 2020-02-09 MED ORDER — ROCURONIUM BROMIDE 100 MG/10ML IV SOLN
INTRAVENOUS | Status: DC | PRN
  Administered 2020-02-09: 40 mg via INTRAVENOUS
  Administered 2020-02-09: 5 mg via INTRAVENOUS

## 2020-02-09 MED ORDER — ALBUTEROL SULFATE (2.5 MG/3ML) 0.083% IN NEBU: 2.5000 mg | INHALATION_SOLUTION | RESPIRATORY_TRACT | Status: DC | PRN

## 2020-02-09 MED ORDER — KETOROLAC TROMETHAMINE 30 MG/ML IJ SOLN
15.0000 mg | Freq: Once | INTRAMUSCULAR | Status: AC
  Administered 2020-02-09: 15 mg via INTRAVENOUS
  Filled 2020-02-09: qty 1

## 2020-02-09 MED ORDER — POTASSIUM CHLORIDE 20 MEQ PO PACK
40.0000 meq | PACK | Freq: Once | ORAL | Status: DC
  Filled 2020-02-09: qty 2

## 2020-02-09 MED ORDER — SODIUM CHLORIDE 0.9 % IV BOLUS
1000.0000 mL | Freq: Once | INTRAVENOUS | Status: AC
  Administered 2020-02-09: 1000 mL via INTRAVENOUS

## 2020-02-09 MED ORDER — SODIUM CHLORIDE 0.9 % IV SOLN: Freq: Once | INTRAVENOUS | Status: AC

## 2020-02-09 MED ORDER — MIDAZOLAM HCL 2 MG/2ML IJ SOLN
INTRAMUSCULAR | Status: DC | PRN
  Administered 2020-02-09: 2 mg via INTRAVENOUS

## 2020-02-09 MED ORDER — ACETAMINOPHEN 10 MG/ML IV SOLN
INTRAVENOUS | Status: AC
  Filled 2020-02-09: qty 100

## 2020-02-09 MED ORDER — POTASSIUM CHLORIDE CRYS ER 20 MEQ PO TBCR
40.0000 meq | EXTENDED_RELEASE_TABLET | Freq: Once | ORAL | Status: DC
  Filled 2020-02-09: qty 2

## 2020-02-09 MED ORDER — MORPHINE SULFATE (PF) 4 MG/ML IV SOLN
INTRAVENOUS | Status: AC
  Administered 2020-02-09: 1 mg via INTRAVENOUS
  Filled 2020-02-09: qty 1

## 2020-02-09 MED ORDER — ESMOLOL HCL 100 MG/10ML IV SOLN
INTRAVENOUS | Status: DC | PRN
Start: 2020-02-09 — End: 2020-02-09
  Administered 2020-02-09: 10 mg via INTRAVENOUS

## 2020-02-09 MED ORDER — ONDANSETRON HCL 4 MG/2ML IJ SOLN: 4.0000 mg | Freq: Once | INTRAMUSCULAR | Status: DC | PRN

## 2020-02-09 MED ORDER — ACETAMINOPHEN 10 MG/ML IV SOLN
INTRAVENOUS | Status: AC
  Administered 2020-02-09: 1000 mg via INTRAVENOUS
  Filled 2020-02-09: qty 100

## 2020-02-09 MED ORDER — DROPERIDOL 2.5 MG/ML IJ SOLN
2.5000 mg | Freq: Once | INTRAMUSCULAR | Status: AC
  Administered 2020-02-09: 2.5 mg via INTRAVENOUS
  Filled 2020-02-09: qty 2

## 2020-02-09 MED ORDER — IOHEXOL 300 MG/ML  SOLN
100.0000 mL | Freq: Once | INTRAMUSCULAR | Status: AC | PRN
  Administered 2020-02-09: 100 mL via INTRAVENOUS

## 2020-02-09 MED ORDER — ACETAMINOPHEN 650 MG RE SUPP: 650.0000 mg | Freq: Four times a day (QID) | RECTAL | Status: DC | PRN

## 2020-02-09 MED ORDER — POTASSIUM CHLORIDE 10 MEQ/100ML IV SOLN
10.0000 meq | INTRAVENOUS | Status: AC
  Administered 2020-02-09 (×3): 10 meq via INTRAVENOUS
  Filled 2020-02-09 (×3): qty 100

## 2020-02-09 MED ORDER — MIDAZOLAM HCL 2 MG/2ML IJ SOLN
INTRAMUSCULAR | Status: AC
  Filled 2020-02-09: qty 2

## 2020-02-09 MED ORDER — FENTANYL CITRATE (PF) 100 MCG/2ML IJ SOLN
25.0000 ug | INTRAMUSCULAR | Status: DC | PRN
  Administered 2020-02-09 (×4): 25 ug via INTRAVENOUS

## 2020-02-09 MED ORDER — FENTANYL CITRATE (PF) 100 MCG/2ML IJ SOLN
INTRAMUSCULAR | Status: AC
  Filled 2020-02-09: qty 2

## 2020-02-09 MED ORDER — ONDANSETRON HCL 4 MG/2ML IJ SOLN
INTRAMUSCULAR | Status: DC | PRN
  Administered 2020-02-09: 4 mg via INTRAVENOUS

## 2020-02-09 MED ORDER — PHENOL 1.4 % MT LIQD
1.0000 | OROMUCOSAL | Status: DC | PRN
  Administered 2020-02-09: 1 via OROMUCOSAL
  Filled 2020-02-09: qty 177

## 2020-02-09 MED ORDER — POTASSIUM CHLORIDE 20 MEQ PO PACK
40.0000 meq | PACK | Freq: Once | ORAL | Status: AC
  Administered 2020-02-09: 40 meq via ORAL

## 2020-02-09 MED ORDER — HYDROXYZINE HCL 50 MG PO TABS
50.0000 mg | ORAL_TABLET | Freq: Three times a day (TID) | ORAL | Status: DC | PRN
  Filled 2020-02-09 (×2): qty 1

## 2020-02-09 MED ORDER — KETOROLAC TROMETHAMINE 30 MG/ML IJ SOLN
15.0000 mg | Freq: Four times a day (QID) | INTRAMUSCULAR | Status: DC | PRN
  Administered 2020-02-09 – 2020-02-12 (×4): 15 mg via INTRAVENOUS
  Filled 2020-02-09 (×5): qty 1

## 2020-02-09 MED ORDER — ACETAMINOPHEN 325 MG PO TABS: 650.0000 mg | ORAL_TABLET | Freq: Four times a day (QID) | ORAL | Status: DC | PRN

## 2020-02-09 MED ORDER — IOHEXOL 180 MG/ML  SOLN
INTRAMUSCULAR | Status: DC | PRN
  Administered 2020-02-09: 20 mL

## 2020-02-09 MED ORDER — MORPHINE SULFATE (PF) 4 MG/ML IV SOLN
INTRAVENOUS | Status: AC
  Filled 2020-02-09: qty 1

## 2020-02-09 MED ORDER — KETOROLAC TROMETHAMINE 30 MG/ML IJ SOLN
INTRAMUSCULAR | Status: AC
  Administered 2020-02-09: 30 mg via INTRAVENOUS
  Filled 2020-02-09: qty 1

## 2020-02-09 MED ORDER — PROPOFOL 10 MG/ML IV BOLUS
INTRAVENOUS | Status: DC | PRN
  Administered 2020-02-09: 180 mg via INTRAVENOUS

## 2020-02-09 MED ORDER — FENTANYL CITRATE (PF) 100 MCG/2ML IJ SOLN
INTRAMUSCULAR | Status: AC
  Administered 2020-02-09: 25 ug via INTRAVENOUS
  Filled 2020-02-09: qty 2

## 2020-02-09 MED ORDER — VENLAFAXINE HCL ER 75 MG PO CP24: 75.0000 mg | ORAL_CAPSULE | Freq: Every day | ORAL | Status: DC

## 2020-02-09 MED ORDER — FENTANYL CITRATE (PF) 100 MCG/2ML IJ SOLN
INTRAMUSCULAR | Status: DC | PRN
  Administered 2020-02-09: 50 ug via INTRAVENOUS

## 2020-02-09 MED ORDER — METOCLOPRAMIDE HCL 5 MG/ML IJ SOLN: 10.0000 mg | Freq: Once | INTRAMUSCULAR | Status: AC

## 2020-02-09 MED ORDER — LABETALOL HCL 5 MG/ML IV SOLN: 20.0000 mg | INTRAVENOUS | Status: DC | PRN

## 2020-02-09 MED ORDER — ONDANSETRON HCL 4 MG/2ML IJ SOLN
INTRAMUSCULAR | Status: AC
  Administered 2020-02-09: 4 mg via INTRAVENOUS
  Filled 2020-02-09: qty 2

## 2020-02-09 MED ORDER — HEPARIN (PORCINE) 25000 UT/250ML-% IV SOLN: 10.0000 [IU]/kg/h | INTRAVENOUS | Status: DC

## 2020-02-09 MED ORDER — SUCCINYLCHOLINE CHLORIDE 20 MG/ML IJ SOLN
INTRAMUSCULAR | Status: DC | PRN
  Administered 2020-02-09: 120 mg via INTRAVENOUS

## 2020-02-09 MED ORDER — TRAZODONE HCL 50 MG PO TABS
200.0000 mg | ORAL_TABLET | Freq: Every day | ORAL | Status: DC
  Administered 2020-02-09 – 2020-02-11 (×3): 200 mg via ORAL
  Filled 2020-02-09 (×3): qty 4

## 2020-02-09 MED ORDER — SODIUM CHLORIDE 0.9 % IV SOLN
2.0000 g | INTRAVENOUS | Status: DC
  Administered 2020-02-09 – 2020-02-11 (×3): 2 g via INTRAVENOUS
  Filled 2020-02-09 (×2): qty 2
  Filled 2020-02-09 (×2): qty 20

## 2020-02-09 SURGICAL SUPPLY — 18 items
BAG DRAIN CYSTO-URO LG1000N (MISCELLANEOUS) ×3 IMPLANT
BRUSH SCRUB EZ 1% IODOPHOR (MISCELLANEOUS) ×3 IMPLANT
CATH URETL 5X70 OPEN END (CATHETERS) ×3 IMPLANT
GLOVE BIO SURGEON STRL SZ 6.5 (GLOVE) ×2 IMPLANT
GLOVE BIO SURGEONS STRL SZ 6.5 (GLOVE) ×1
GOWN STRL REUS W/ TWL LRG LVL3 (GOWN DISPOSABLE) ×2 IMPLANT
GOWN STRL REUS W/TWL LRG LVL3 (GOWN DISPOSABLE) ×4
GUIDEWIRE STR DUAL SENSOR (WIRE) ×3 IMPLANT
KIT TURNOVER CYSTO (KITS) ×3 IMPLANT
PACK CYSTO AR (MISCELLANEOUS) ×3 IMPLANT
SET CYSTO W/LG BORE CLAMP LF (SET/KITS/TRAYS/PACK) ×3 IMPLANT
SOL .9 NS 3000ML IRR  AL (IV SOLUTION) ×2
SOL .9 NS 3000ML IRR UROMATIC (IV SOLUTION) ×1 IMPLANT
STENT URET 6FRX24 CONTOUR (STENTS) ×3 IMPLANT
STENT URET 6FRX26 CONTOUR (STENTS) IMPLANT
SURGILUBE 2OZ TUBE FLIPTOP (MISCELLANEOUS) ×3 IMPLANT
SYRINGE IRR TOOMEY STRL 70CC (SYRINGE) ×3 IMPLANT
WATER STERILE IRR 1000ML POUR (IV SOLUTION) ×3 IMPLANT

## 2020-02-09 NOTE — Transfer of Care (Signed)
Immediate Anesthesia Transfer of Care Note  Patient: Katrina Page  Procedure(s) Performed: CYSTOSCOPY WITH STENT PLACEMENT (Left Ureter)  Patient Location: PACU  Anesthesia Type:General  Level of Consciousness: awake and alert   Airway & Oxygen Therapy: Patient Spontanous Breathing and Patient connected to face mask oxygen  Post-op Assessment: Report given to RN and Post -op Vital signs reviewed and stable  Post vital signs: Reviewed and stable  Last Vitals:  Vitals Value Taken Time  BP 123/51 02/09/20 1845  Temp 36.2 C 02/09/20 1845  Pulse 98 02/09/20 1852  Resp 13 02/09/20 1852  SpO2 100 % 02/09/20 1852  Vitals shown include unvalidated device data.  Last Pain:  Vitals:   02/09/20 1845  TempSrc:   PainSc: 0-No pain         Complications: No apparent anesthesia complications

## 2020-02-09 NOTE — OR Nursing (Addendum)
Per Dr. Erlene Quan will not stop heparin prior to surgery

## 2020-02-09 NOTE — ED Provider Notes (Signed)
[facility] Emergency Department Provider Note  ____________________________________________   First MD Initiated Contact with Patient 02/08/20 2333     (approximate)  I have reviewed the triage vital signs and the nursing notes.   HISTORY  Chief Complaint Nausea and Emesis    HPI Katrina Page is a 39 y.o. female with medical and psychiatric history as listed below who presents for evaluation  of 3 days of persistent and severe nausea and vomiting.  She says she cannot keep down anything.  She is not having pain except for some generalized soreness as result of all the vomiting.  No diarrhea.  She denies fever, sore throat, chest pain.  She denies any recent drug use or medication changes.  She occasionally has some aching abdominal pain but again she thinks it might be due to all the vomiting.  She has never had symptoms like this in the past and nothing in particular makes it better or worse.        Past Medical History:  Diagnosis Date  . Migraines     Patient Active Problem List   Diagnosis Date Noted  . Severe recurrent major depression without psychotic features ([place]) 12/29/2019  . Suicide attempt by drug overdose ([place]) 12/29/2019  . Chronic back pain 12/29/2019  . Nicotine abuse 12/29/2019  . MDD (major depressive disorder) 12/28/2019  . MDD (major depressive disorder), severe ([place]) 12/24/2019  . Alcohol abuse 12/24/2019  . Nephrolithiasis 08/25/2019  . Chest pain 07/24/2019  . Dyspnea 07/24/2019  . Tobacco use 07/24/2019  . Pulmonary embolism ([place]) 07/23/2019  . Sepsis ([place]) 11/29/2018    Past Surgical History:  Procedure Laterality Date  . CHOLECYSTECTOMY    . TUBAL LIGATION      Prior to Admission medications   Medication Sig Start Date End Date Taking? Authorizing Provider  albuterol (VENTOLIN HFA) 108 (90 Base) MCG/ACT inhaler Inhale 1-2 puffs into the lungs every 4 (four) hours as needed for wheezing or shortness of  breath. 01/02/20   Clapacs, Madie Reno, MD  hydrOXYzine (ATARAX/VISTARIL) 50 MG tablet Take 1 tablet (50 mg total) by mouth 3 (three) times daily as needed for anxiety. 01/02/20   Clapacs, Madie Reno, MD  Oxycodone HCl 10 MG TABS Take 1 tablet (10 mg total) by mouth every 6 (six) hours as needed (moderate to severe pain). 01/02/20   Clapacs, Madie Reno, MD  rivaroxaban (XARELTO) 20 MG TABS tablet Take 1 tablet (20 mg total) by mouth daily with supper. 01/02/20   Clapacs, Madie Reno, MD  traZODone (DESYREL) 100 MG tablet Take 2 tablets (200 mg total) by mouth at bedtime. 01/02/20   Clapacs, Madie Reno, MD  venlafaxine XR (EFFEXOR-XR) 75 MG 24 hr capsule Take 1 capsule (75 mg total) by mouth daily with breakfast. 01/02/20   Clapacs, Madie Reno, MD    Allergies Patient has no known allergies.  Family History  Problem Relation Age of Onset  . Hypertension Mother   . Diabetes Mother   . Stroke Mother   . Breast cancer Mother     Social History Social History   Tobacco Use  . Smoking status: Current Every Day Smoker    Packs/day: 0.50    Years: 20.00    Pack years: 10.00    Types: Cigarettes  . Smokeless tobacco: Never Used  Substance Use Topics  . Alcohol use: Yes    Comment: 2-3 week.    . Drug use: No    Review of Systems  Constitutional: No fever/chills Eyes: No visual changes. ENT: No sore throat. Cardiovascular: Denies chest pain. Respiratory: Denies shortness of breath. Gastrointestinal: Intractable nausea and vomiting over the last 3 days.  No significant pain although some intermittent abdominal aching. Genitourinary: Negative for dysuria. Musculoskeletal: Negative for neck pain.  Negative for back pain. Integumentary: Negative for rash. Neurological: Negative for headaches, focal weakness or numbness.   ____________________________________________   PHYSICAL EXAM:  VITAL SIGNS: ED Triage Vitals [02/08/20 1829]  Enc Vitals Group     BP (!) 143/73     Pulse Rate 77     Resp 16     Temp  98.2 F (36.8 C)     Temp Source Oral     SpO2 96 %     Weight 97.5 kg (215 lb)     Height 1.753 m (5\' 9" )     Head Circumference      Peak Flow      Pain Score 0     Pain Loc      Pain Edu?      Excl. in [place]?     Constitutional: Alert and oriented.  Eyes: Conjunctivae are normal.  Head: Atraumatic. Nose: No congestion/rhinnorhea. Mouth/Throat: Patient is wearing a mask. Neck: No stridor.  No meningeal signs.   Cardiovascular: Normal rate, regular rhythm. Good peripheral circulation. Grossly normal heart sounds. Respiratory: Normal respiratory effort.  No retractions. Gastrointestinal: Obese.  Soft and nontender.  No rebound or guarding. Musculoskeletal: No lower extremity tenderness nor edema. No gross deformities of extremities. Neurologic:  Normal speech and language. No gross focal neurologic deficits are appreciated.  Skin:  Skin is warm, dry and intact. Psychiatric: Mood and affect are somewhat unusual but may be understandable under the circumstances.  ____________________________________________   LABS (all labs ordered are listed, but only abnormal results are displayed)  Labs Reviewed  COMPREHENSIVE METABOLIC PANEL - Abnormal; Notable for the following components:      Result Value   Potassium 2.4 (*)    Chloride 96 (*)    Glucose, Bld 144 (*)    Creatinine, Ser 1.24 (*)    Total Protein 8.2 (*)    Total Bilirubin 2.0 (*)    GFR calc non Af Amer 54 (*)    Anion gap 16 (*)    All other components within normal limits  CBC - Abnormal; Notable for the following components:   WBC 13.9 (*)    Hemoglobin 8.6 (*)    HCT 30.0 (*)    MCV 74.1 (*)    MCH 21.2 (*)    MCHC 28.7 (*)    RDW 24.4 (*)    Platelets 108 (*)    All other components within normal limits  MAGNESIUM - Abnormal; Notable for the following components:   Magnesium 1.2 (*)    All other components within normal limits  URINE CULTURE  SARS CORONAVIRUS 2 BY RT PCR ([facility])  LIPASE, BLOOD  HCG, QUANTITATIVE, PREGNANCY  URINALYSIS, COMPLETE (UACMP) WITH MICROSCOPIC  PROTIME-INR  APTT  POC URINE PREG, ED   ____________________________________________  EKG  ED ECG REPORT I, Hinda Kehr, the attending physician, personally viewed and interpreted this ECG.  Date: 02/09/2020 EKG Time: 3:42 AM Rate: 94 Rhythm: normal sinus rhythm QRS Axis: normal Intervals: Slightly prolonged QTC of 533 ms. ST/T Wave abnormalities: Non-specific ST segment / T-wave changes, but no clear evidence of acute ischemia.  No U waves. Narrative Interpretation: no definitive evidence  of acute ischemia; does not meet STEMI criteria.   ____________________________________________  RADIOLOGY I, Hinda Kehr, personally viewed and evaluated these images (plain radiographs) as part of my medical decision making, as well as reviewing the written report by the radiologist.  ED MD interpretation: X-rays demonstrate a large renal calculus and some dilated bowel loops suggestive of partial SBO or SBO.  CT scan shows no sign of obstruction but demonstrates a large left renal stone with some obstructive uropathy as well as a left adrenal mass that is increased in size from the last scan.  Official radiology report(s): CT ABDOMEN PELVIS W CONTRAST  Result Date: 02/09/2020 CLINICAL DATA:  Nausea and vomiting EXAM: CT ABDOMEN AND PELVIS WITH CONTRAST TECHNIQUE: Multidetector CT imaging of the abdomen and pelvis was performed using the standard protocol following bolus administration of intravenous contrast. CONTRAST:  163mL OMNIPAQUE IOHEXOL 300 MG/ML  SOLN COMPARISON:  11/27/2007, PET CT 08/23/2019, radiograph 12/10/2019 FINDINGS: Lower chest: Lung bases demonstrate hazy dependent atelectasis. Hepatobiliary: Hepatic steatosis. Status post cholecystectomy. No biliary dilatation Pancreas: Unremarkable. No pancreatic ductal dilatation or surrounding inflammatory changes.  Spleen: Normal in size without focal abnormality. Adrenals/Urinary Tract: Stable 1.4 cm right adrenal gland nodule suggestive of adenoma. Increased size of left adrenal mass measuring 6 x 3.9 cm, previously 5.6 x 3.8 cm. Multiple intrarenal stones bilaterally. 13 mm left renal collecting stone with obstructive changes. Soft tissue stranding about the left renal pelvis. Mild urothelial enhancement of left ureter. The bladder is unremarkable. Small cyst in the lower pole of the right kidney. Stomach/Bowel: Stomach is nonenlarged. No dilated small bowel. No bowel wall thickening. Negative appendix. Sigmoid colon diverticula without acute inflammatory change Vascular/Lymphatic: No significant vascular findings are present. No enlarged abdominal or pelvic lymph nodes. Reproductive: Uterus and bilateral adnexa are unremarkable. Other: Negative for free air or free fluid. Musculoskeletal: No acute or significant osseous findings. IMPRESSION: 1. Negative for bowel obstruction bowel wall thickening. 2. 13 mm left renal collecting system stone with obstructive changes. There is inflammatory change about the left renal pelvis and mild urothelial enhancement of left ureter. There are multiple additional stones within the bilateral kidneys. 3. Stable 14 mm right adrenal nodule likely adenoma. Suspected interval enlargement of left adrenal mass, now measuring 6 cm. Given growth, consider surgical consultation. Electronically Signed   By: Donavan Foil M.D.   On: 02/09/2020 03:32   DG Abdomen Acute W/Chest  Result Date: 02/09/2020 CLINICAL DATA:  Persistent vomiting EXAM: DG ABDOMEN ACUTE W/ 1V CHEST COMPARISON:  04/03/2016, PET CT 08/23/2019 FINDINGS: Single-view chest demonstrates focal atelectasis or small infiltrate at the right base. Ovoid opacities in the left mid and lower thorax possibly related to healing rib fractures. Normal heart size. No pneumothorax. Supine and upright views of the abdomen demonstrate no free air  beneath the diaphragm. Mildly dilated small bowel in the left mid abdominal region with relative absence of distal gas. 19 mm calcification in the left mid abdomen corresponds to a renal pelvis kidney stone IMPRESSION: 1. Small focus of atelectasis or infiltrate at the right base. Possible multiple healing left rib fractures 2. Dilated loops of small bowel in the left abdomen with relative absence of distal gas; findings could be secondary to bowel obstruction though focal or localized ileus could also produce this appearance. 3. 19 mm left kidney stone Electronically Signed   By: Donavan Foil M.D.   On: 02/09/2020 02:27    ____________________________________________   PROCEDURES   Procedure(s) performed (including Critical Care):  Procedures   ____________________________________________   INITIAL IMPRESSION / MDM / ASSESSMENT AND PLAN / ED COURSE  As part of my medical decision making, I reviewed the following data within the [place] notes reviewed and incorporated, Labs reviewed , EKG interpreted  Old chart reviewed, Radiograph reviewed , Discussed with admitting physician , reviewed Notes from prior ED visits and [place] Controlled Substance Database   Differential diagnosis includes, but is not limited to, viral gastritis, alcoholic gastritis, nonspecific viral or bacterial infection, SBO/ileus, cyclic vomiting syndrome, diabetic gastroparesis, cannabinoid hyperemesis syndrome.  Of note the patient has no tenderness to palpation of the abdomen and is not reporting any significant amount of pain.  She started to report pain when she got to her exam room but she said that is in her back and is from sitting in the lobby for about 7 hours and she has a history of chronic back pain.  She is not experiencing acute pain is results of her abdominal issues.  She has no other significant symptoms.  Patient's lab work is notable for hypokalemia at 2.4.  She has been  drinking water and ice even though she was told to remain n.p.o., but given that she is tolerating small amounts I have ordered potassium 40 mEq by mouth to begin repletion as well as magnesium 2 g IV to help with absorption.  I ordered plain films of the abdomen (acute abdomen series) to evaluate for the possibility of dilated bowel loops.  She is getting 1 L normal saline for rehydration and given that the Zofran 4 mg she received earlier is not helping I have ordered droperidol 2.5 mg IV for her intractable nausea and vomiting.  I will reassess once the imaging is back.     Clinical Course as of Feb 09 416  Thu Feb 09, 2020  0040 There is some evidence of obstruction on the patient's radiographs.  Given her presentation and the abnormal radiographs, I am proceeding with CT scan of the abdomen and pelvis to further evaluate for possible bowel obstruction.  DG Abdomen Acute W/Chest [CF]  [id number] Patient has a large renal calculus with some obstructive changes, worse than prior.  She also has an adrenal mass that is worse and larger than prior and radiology is recommending surgical consultation.  No sign of bowel obstruction.I discussed these findings with the patient.  She has been able to tolerate a little bit of oral fluid but not much of the potassium repletion.  She continues to be nauseated.Given the constellation of symptoms including a potassium which meets inpatient criteria for treatment, intractable nausea and vomiting, and multiple abnormalities which would benefit from both urological and surgical consultation, I will admit the patient to the hospitalist for consultation in the morning by the specialists and more acutely for rehydration and potassium repletion as well as treatment of her hypokalemia.  Patient agrees with the plan.   [CF]  0400 Of note, the patient says she was aware of the renal calculus and the mass on her adrenal gland but she never followed up.   [CF]  0400 I am ordering  potassium 10 mEq IV x6 doses to help improve her potassium.  I have also ordered normal saline infusion at 100 mL/h to help with the potassium ministration.  I have consulted the hospitalist for admission.   [CF]  [phone] I discussed the case by phone with Dr. Sidney Ace with the hospitalist service who will admit.   [CF]  Clinical Course User Index [CF] Hinda Kehr, MD     ____________________________________________  FINAL CLINICAL IMPRESSION(S) / ED DIAGNOSES  Final diagnoses:  Intractable nausea and vomiting  Hypokalemia due to excessive gastrointestinal loss of potassium  Hypomagnesemia  Left adrenal mass (HCC)  Left renal stone  Obstructive uropathy     MEDICATIONS GIVEN DURING THIS VISIT:  Medications  potassium chloride SA (KLOR-CON) CR tablet 40 mEq (40 mEq Oral Not Given 02/09/20 0157)  potassium chloride 10 mEq in 100 mL IVPB (has no administration in time range)  0.9 %  sodium chloride infusion (has no administration in time range)  sodium chloride flush (NS) 0.9 % injection 3 mL (3 mLs Intravenous Given 02/09/20 0158)  ondansetron (ZOFRAN-ODT) disintegrating tablet 4 mg (4 mg Oral Given by Other 02/08/20 2010)  promethazine (PHENERGAN) injection 12.5 mg (12.5 mg Intramuscular Given 02/08/20 2357)  sodium chloride 0.9 % bolus 1,000 mL (0 mLs Intravenous Stopped 02/09/20 0239)  droperidol (INAPSINE) 2.5 MG/ML injection 2.5 mg (2.5 mg Intravenous Given 02/09/20 0119)  magnesium sulfate IVPB 2 g 50 mL (0 g Intravenous Stopped 02/09/20 0238)  ketorolac (TORADOL) 30 MG/ML injection 15 mg (15 mg Intravenous Given 02/09/20 0157)  potassium chloride (KLOR-CON) packet 40 mEq (40 mEq Oral Given 02/09/20 0238)  iohexol (OMNIPAQUE) 300 MG/ML solution 100 mL (100 mLs Intravenous Contrast Given 02/09/20 0248)     ED Discharge Orders    None      *Please note:  Katrina Page was evaluated in Emergency Department on 02/09/2020 for the symptoms described in the history of present illness.  She was evaluated in the context of the global COVID-19 pandemic, which necessitated consideration that the patient might be at risk for infection with the SARS-CoV-2 virus that causes COVID-19. Institutional protocols and algorithms that pertain to the evaluation of patients at risk for COVID-19 are in a state of rapid change based on information released by regulatory bodies including the CDC and federal and state organizations. These policies and algorithms were followed during the patient's care in the ED.  Some ED evaluations and interventions may be delayed as a result of limited staffing during the pandemic.*  Note:  This document was prepared using Dragon voice recognition software and may include unintentional dictation errors.   Hinda Kehr, MD 02/09/20 [phone]

## 2020-02-09 NOTE — ED Notes (Signed)
Pt wondering in the hallway at this time, stating I need nausea medication. Pt directed back to room at this time.

## 2020-02-09 NOTE — ED Notes (Signed)
Report given to Maudie Mercury, RN- per Maudie Mercury pt room is being covid cleaned at this time

## 2020-02-09 NOTE — OR Nursing (Signed)
Report given to [employer]

## 2020-02-09 NOTE — Progress Notes (Signed)
This patient had significant hypokalemia on initial labs 2.4 which had not been repleted since yesterday at 6 pm.  She has received  K  repletion  however repeat potassium in the preoperative holding area was still very low,  2.0.  Urinalysis does show fairly significant WBC concerning for finection with obstructive changes in the kidney.  As such, there is some urgency to ureteral stent placement but this is not a true emergency at this point given her hemodynamic stability.  As such, after discussion with our anesthesia colleagues, we have agreed to replete her potassium for an additional 3 hours, then go ahead and proceed with stent placement both to optimize source control as well as the safety of an anesthetic.  Hollice Espy, MD

## 2020-02-09 NOTE — Progress Notes (Signed)
PROGRESS NOTE    Katrina Page  [id number] DOB: [birth year] DOA: 02/08/2020 PCP: Patient, No Pcp Per   Brief Narrative:  Katrina Page  is a 39 y.o. Caucasian female with a known history of PE, urolithiasis, depression and anxiety as well as migraine, who presented to the emergency room with acute onset of intractable nausea and vomiting of days with associated flank pain, more on the right than the left without dysuria or urinary frequency or urgency or hematuria.  CT abdomen and pelvis was negative for any small bowel obstruction  or bowel thickening.  It did show an obstructing left renal stone with some inflammatory changes.  There were multiple stones seen bilateral kidneys.  It was also positive for a stable 14 mm right adrenal nodule likely adenoma with another left-sided adrenal mass with increase in size, now measuring 6 mm which needs surgical evaluation. UA with pyuria, urine cultures pending. Urology and general surgery was consulted from ED.  Subjective: Patient continued to experience bilateral flank pain, right more than left.  She continued to experience some nausea, no vomiting since in hospital.  Assessment & Plan:   Active Problems:   Intractable nausea and vomiting  Left-sided obstructive uropathy with 13 mm stone.  Most likely the cause of her nausea and vomiting.  Urology was consulted and patient will go for stent placement today after repleting potassium. UA with pyuria, urine cultures pending. -Continue with IV hydration. -Start her on ceftriaxone-we will de-escalate once more culture results available.  Bilateral adrenal nodules, enlarging on the left. No prior diagnosis.  General surgery was consulted-we will appreciate their recommendations.  Hypokalemia/hypomagnesemia.  Patient with hypokalemia and hypomagnesemia most likely secondary to GI losses. -Repleat electrolytes and monitor.  History of PE.  Patient was on Xarelto at home, which was held due to  upcoming procedure. -Continue with heparin for now.  Hypertension.  Blood pressure mildly elevated.  Most likely secondary to pain.  Patient is not on any antihypertensives at home. -Continue to monitor. -Continue with IV labetalol as needed.  History of anxiety/depression. -Holding home dose of venlafaxine as patient is n.p.o.  Objective: Vitals:   02/09/20 0600 02/09/20 0630 02/09/20 1003 02/09/20 1330  BP: (!) 145/86 (!) 147/84 137/78 (!) 159/108  Pulse: 95 99 86 88  Resp: 18 18 16 20   Temp:   98.2 F (36.8 C) 98.7 F (37.1 C)  TempSrc:   Oral Temporal  SpO2: 97% 96% 100% 100%  Weight:      Height:        Intake/Output Summary (Last 24 hours) at 02/09/2020 1448 Last data filed at 02/09/2020 1200 Gross per 24 hour  Intake 2463.15 ml  Output 500 ml  Net 1963.15 ml   Filed Weights   02/08/20 1829  Weight: 97.5 kg    Examination:  General exam: Appears calm and comfortable  Respiratory system: Clear to auscultation. Respiratory effort normal. Cardiovascular system: S1 & S2 heard, RRR. No JVD, murmurs, rubs, gallops or clicks. Gastrointestinal system: Soft, bilateral CVA tenderness, right more than left, nondistended, bowel sounds positive. Central nervous system: Alert and oriented. No focal neurological deficits.Symmetric 5 x 5 power. Extremities: No edema, no cyanosis, pulses intact and symmetrical. Skin: No rashes, lesions or ulcers Psychiatry: Judgement and insight appear normal. Mood & affect appropriate.    DVT prophylaxis: Heparin Code Status: Full Family Communication: Discussed with patient Disposition Plan:  Status is: Inpatient  Remains inpatient appropriate because:Inpatient level of care appropriate due to severity of  illness   Dispo: The patient is from: Home              Anticipated d/c is to: Home              Anticipated d/c date is: 1 day              Patient currently is not medically stable to d/c.  Patient is undergoing stent placement  later today.  Still needs surgical evaluation.  Consultants:   Urology  Surgery  Procedures:  Antimicrobials:  Ceftriaxone  Data Reviewed: I have personally reviewed following labs and imaging studies  CBC: Recent Labs  Lab 02/08/20 1834  WBC 13.9*  HGB 8.6*  HCT 30.0*  MCV 74.1*  PLT 123XX123*   Basic Metabolic Panel: Recent Labs  Lab 02/08/20 1834  NA 144  K 2.4*  CL 96*  CO2 32  GLUCOSE 144*  BUN 10  CREATININE 1.24*  CALCIUM 9.3  MG 1.2*   GFR: Estimated Creatinine Clearance: 74.9 mL/min (A) (by C-G formula based on SCr of 1.24 mg/dL (H)). Liver Function Tests: Recent Labs  Lab 02/08/20 1834  AST 24  ALT 14  ALKPHOS 68  BILITOT 2.0*  PROT 8.2*  ALBUMIN 4.2   Recent Labs  Lab 02/08/20 1834  LIPASE 49   No results for input(s): AMMONIA in the last 168 hours. Coagulation Profile: Recent Labs  Lab 02/09/20 0644  INR 1.2   Cardiac Enzymes: No results for input(s): CKTOTAL, CKMB, CKMBINDEX, TROPONINI in the last 168 hours. BNP (last 3 results) No results for input(s): PROBNP in the last 8760 hours. HbA1C: No results for input(s): HGBA1C in the last 72 hours. CBG: No results for input(s): GLUCAP in the last 168 hours. Lipid Profile: No results for input(s): CHOL, HDL, LDLCALC, TRIG, CHOLHDL, LDLDIRECT in the last 72 hours. Thyroid Function Tests: No results for input(s): TSH, T4TOTAL, FREET4, T3FREE, THYROIDAB in the last 72 hours. Anemia Panel: No results for input(s): VITAMINB12, FOLATE, FERRITIN, TIBC, IRON, RETICCTPCT in the last 72 hours. Sepsis Labs: No results for input(s): PROCALCITON, LATICACIDVEN in the last 168 hours.  Recent Results (from the past 240 hour(s))  SARS Coronavirus 2 by RT PCR (hospital order, performed in [facility] hospital lab) Nasopharyngeal Nasopharyngeal Swab     Status: None   Collection Time: 02/09/20  4:40 AM   Specimen: Nasopharyngeal Swab  Result Value Ref Range Status   SARS Coronavirus 2 NEGATIVE  NEGATIVE Final    Comment: (NOTE) SARS-CoV-2 target nucleic acids are NOT DETECTED. The SARS-CoV-2 RNA is generally detectable in upper and lower respiratory specimens during the acute phase of infection. The lowest concentration of SARS-CoV-2 viral copies this assay can detect is 250 copies / mL. A negative result does not preclude SARS-CoV-2 infection and should not be used as the sole basis for treatment or other patient management decisions.  A negative result may occur with improper specimen collection / handling, submission of specimen other than nasopharyngeal swab, presence of viral mutation(s) within the areas targeted by this assay, and inadequate number of viral copies (<250 copies / mL). A negative result must be combined with clinical observations, patient history, and epidemiological information. Fact Sheet for Patients:   StrictlyIdeas.no Fact Sheet for Healthcare Providers: BankingDealers.co.za This test is not yet approved or cleared  by the [place] FDA and has been authorized for detection and/or diagnosis of SARS-CoV-2 by FDA under an Emergency Use Authorization (EUA).  This EUA will remain in effect (  meaning this test can be used) for the duration of the COVID-19 declaration under Section 564(b)(1) of the Act, 21 U.S.C. section 360bbb-3(b)(1), unless the authorization is terminated or revoked sooner. Performed at [facility], [address] [place], [place] [address]      Radiology Studies: CT ABDOMEN PELVIS W CONTRAST  Result Date: 02/09/2020 CLINICAL DATA:  Nausea and vomiting EXAM: CT ABDOMEN AND PELVIS WITH CONTRAST TECHNIQUE: Multidetector CT imaging of the abdomen and pelvis was performed using the standard protocol following bolus administration of intravenous contrast. CONTRAST:  152mL OMNIPAQUE IOHEXOL 300 MG/ML  SOLN COMPARISON:  11/27/2007, PET CT 08/23/2019, radiograph 12/10/2019 FINDINGS:  Lower chest: Lung bases demonstrate hazy dependent atelectasis. Hepatobiliary: Hepatic steatosis. Status post cholecystectomy. No biliary dilatation Pancreas: Unremarkable. No pancreatic ductal dilatation or surrounding inflammatory changes. Spleen: Normal in size without focal abnormality. Adrenals/Urinary Tract: Stable 1.4 cm right adrenal gland nodule suggestive of adenoma. Increased size of left adrenal mass measuring 6 x 3.9 cm, previously 5.6 x 3.8 cm. Multiple intrarenal stones bilaterally. 13 mm left renal collecting stone with obstructive changes. Soft tissue stranding about the left renal pelvis. Mild urothelial enhancement of left ureter. The bladder is unremarkable. Small cyst in the lower pole of the right kidney. Stomach/Bowel: Stomach is nonenlarged. No dilated small bowel. No bowel wall thickening. Negative appendix. Sigmoid colon diverticula without acute inflammatory change Vascular/Lymphatic: No significant vascular findings are present. No enlarged abdominal or pelvic lymph nodes. Reproductive: Uterus and bilateral adnexa are unremarkable. Other: Negative for free air or free fluid. Musculoskeletal: No acute or significant osseous findings. IMPRESSION: 1. Negative for bowel obstruction bowel wall thickening. 2. 13 mm left renal collecting system stone with obstructive changes. There is inflammatory change about the left renal pelvis and mild urothelial enhancement of left ureter. There are multiple additional stones within the bilateral kidneys. 3. Stable 14 mm right adrenal nodule likely adenoma. Suspected interval enlargement of left adrenal mass, now measuring 6 cm. Given growth, consider surgical consultation. Electronically Signed   By: Donavan Foil M.D.   On: 02/09/2020 03:32   DG Abdomen Acute W/Chest  Result Date: 02/09/2020 CLINICAL DATA:  Persistent vomiting EXAM: DG ABDOMEN ACUTE W/ 1V CHEST COMPARISON:  04/03/2016, PET CT 08/23/2019 FINDINGS: Single-view chest demonstrates focal  atelectasis or small infiltrate at the right base. Ovoid opacities in the left mid and lower thorax possibly related to healing rib fractures. Normal heart size. No pneumothorax. Supine and upright views of the abdomen demonstrate no free air beneath the diaphragm. Mildly dilated small bowel in the left mid abdominal region with relative absence of distal gas. 19 mm calcification in the left mid abdomen corresponds to a renal pelvis kidney stone IMPRESSION: 1. Small focus of atelectasis or infiltrate at the right base. Possible multiple healing left rib fractures 2. Dilated loops of small bowel in the left abdomen with relative absence of distal gas; findings could be secondary to bowel obstruction though focal or localized ileus could also produce this appearance. 3. 19 mm left kidney stone Electronically Signed   By: Donavan Foil M.D.   On: 02/09/2020 02:27   DG OR UROLOGY CYSTO IMAGE ([facility] ONLY)  Result Date: 02/09/2020 There is no interpretation for this exam.  This order is for images obtained during a surgical procedure.  Please See "Surgeries" Tab for more information regarding the procedure.    Scheduled Meds: . morphine      . [MAR Hold] potassium chloride  40 mEq Oral Once  . [  MAR Hold] traZODone  200 mg Oral QHS   Continuous Infusions: . sodium chloride    . heparin 1,400 Units/hr (02/09/20 0649)  . potassium chloride 10 mEq (02/09/20 1420)     LOS: 0 days   Time spent: 40 minutes.  Lorella Nimrod, MD [facility]  If 7PM-7AM, please contact night-coverage [email or web address]  02/09/2020, 2:48 PM   This record has been created using Systems analyst. Errors have been sought and corrected,but may not always be located. Such creation errors do not reflect on the standard of care.

## 2020-02-09 NOTE — Anesthesia Preprocedure Evaluation (Addendum)
Anesthesia Evaluation  Patient identified by MRN, date of birth, ID band Patient awake    Reviewed: Allergy & Precautions, NPO status , Patient's Chart, lab work & pertinent test results  History of Anesthesia Complications Negative for: history of anesthetic complications  Airway Mallampati: III  TM Distance: >3 FB Neck ROM: Full    Dental no notable dental hx. (+) Teeth Intact, Dental Advisory Given, Poor Dentition   Pulmonary shortness of breath, neg sleep apnea, neg COPD, Current Smoker and Patient abstained from smoking.,  PE in October on xarelto, now on heparin gtt in hospital   Pulmonary exam normal breath sounds clear to auscultation       Cardiovascular Exercise Tolerance: Good METS(-) hypertension(-) CAD and (-) Past MI negative cardio ROS  (-) dysrhythmias  Rhythm:Regular Rate:Normal - Systolic murmurs    Neuro/Psych  Headaches, PSYCHIATRIC DISORDERS Depression negative neurological ROS  negative psych ROS   GI/Hepatic neg GERD  ,(+)     (-) substance abuse  ,   Endo/Other  neg diabetes  Renal/GU Renal diseasenegative Renal ROS     Musculoskeletal   Abdominal   Peds  Hematology   Anesthesia Other Findings Past Medical History: No date: Migraines  Reproductive/Obstetrics                             Anesthesia Physical Anesthesia Plan  ASA: III  Anesthesia Plan: General   Post-op Pain Management:    Induction: Intravenous, Rapid sequence and Cricoid pressure planned  PONV Risk Score and Plan: 4 or greater and Ondansetron and Dexamethasone  Airway Management Planned: Oral ETT and Video Laryngoscope Planned  Additional Equipment: None  Intra-op Plan:   Post-operative Plan: Extubation in OR  Informed Consent: I have reviewed the patients History and Physical, chart, labs and discussed the procedure including the risks, benefits and alternatives for the proposed  anesthesia with the patient or authorized representative who has indicated his/her understanding and acceptance.     Dental advisory given  Plan Discussed with: CRNA and Surgeon  Anesthesia Plan Comments: (Patient with continued nausea today; last vomiting last night. Patient on heparin gtt for bridging from [place]. Patient hypokalemic, receiving repletion. Discussed with Dr Erlene Quan who deems this procedure urgent; risks of waiting for full potassium repletion outweigh risk of proceeding with case. Discussed risks of anesthesia with patient, including PONV, sore throat, aspiration, lip/dental damage. Rare risks discussed as well, such as cardiorespiratory and neurological sequelae. Patient understands.)       Anesthesia Quick Evaluation

## 2020-02-09 NOTE — Progress Notes (Addendum)
ANTICOAGULATION CONSULT NOTE  Pharmacy Consult for heparin Indication: VTE prophylaxis  No Known Allergies  Patient Measurements: Height: 5\' 9"  (175.3 cm) Weight: 97.5 kg (215 lb) IBW/kg (Calculated) : 66.2 Heparin Dosing Weight: 78 kg  Vital Signs: Temp: 97.1 F (36.2 C) (05/20 1845) Temp Source: Temporal (05/20 1330) BP: 140/74 (05/20 2000) Pulse Rate: 78 (05/20 2000)  Labs: Recent Labs    02/08/20 1834 02/09/20 0644 02/09/20 1622  HGB 8.6*  --  7.1*  HCT 30.0*  --  25.5*  PLT 108*  --  74*  APTT  --  28  --   LABPROT  --  14.4  --   INR  --  1.2  --   HEPARINUNFRC  --  <0.10* 0.18*  CREATININE 1.24*  --  1.01*  0.99    Estimated Creatinine Clearance: 93.8 mL/min (by C-G formula based on SCr of 0.99 mg/dL).   Medical History: Past Medical History:  Diagnosis Date  . Migraines     Medications:  Scheduled:  . morphine      . [MAR Hold] potassium chloride  40 mEq Oral Once  . [MAR Hold] traZODone  200 mg Oral QHS    Assessment: Patient admitted for N/V s/t obstructive uropathy w/ h/o PE anticoagulated w/ xarelto 20 mg daily PTA. Baseline CBC low but stable for patient. Baseline stat aPTT/INR/anti-Xa pending. Patient's xarelto is being held for possible nephrostomy stent placement and being started on heparin drip for h/o PE's/VTE prophylaxis.  Goal of Therapy:  Heparin level 0.3-0.7 units/ml Monitor platelets by anticoagulation protocol: Yes   Plan:  5/20 1622 HL 0.18 subtherapeutic. Patient has been in procedural area most of day. Procedure end approximately [id number]. Will omit heparin bolus since patient is just out of procedure and increase heparin drip to 1550 units/hr. CBC has trended down. Patient with h/o low Hg ~ 7-8, platelets lower than usual trend. HL at 0300. Will f/u CBC with next HL and consider discontinuation if CBC continues to trend down.  Dorena Bodo, PharmD Clinical Pharmacist 02/09/2020,8:15 PM

## 2020-02-09 NOTE — Op Note (Signed)
Date of procedure: 02/09/20  Preoperative diagnosis:  1. Left obstructing UPJ calculus 2. UTI  Postoperative diagnosis:  1. Same as above  Procedure: 1. Left retrograde pyelogram 2. Left ureteral stent placement  Surgeon: Hollice Espy, MD  Anesthesia: General  Complications: None  Intraoperative findings: Copious amounts of bladder debris and cloudy urine consistent with cystitis.  Stone easily visible on scout.  Mild left renal pelvic fullness.  Stent placed without difficulty.  EBL: Minimal  Drains: 6 or 24 French double-J ureteral stent on left  Indication: Katrina Page is a 39 y.o. patient with large left renal pelvic stone with obstructive changes.  After reviewing the management options for treatment, she elected to proceed with the above surgical procedure(s). We have discussed the potential benefits and risks of the procedure, side effects of the proposed treatment, the likelihood of the patient achieving the goals of the procedure, and any potential problems that might occur during the procedure or recuperation. Informed consent has been obtained.  Description of procedure:  The patient was taken to the operating room and general anesthesia was induced.  The patient was placed in the dorsal lithotomy position, prepped and draped in the usual sterile fashion, and preoperative antibiotics (given in preoperative holding area, ceftriaxone) were administered. A preoperative time-out was performed.   A 21 French scope was advanced per urethra into the bladder.  Notably, the bladder was fairly erythematous diffusely consistent with cystitis.  The urine was cloudy with a copious amount of debris.  Attention was turned to the left ureteral orifice.  On scout imaging, the stone in the renal pelvis could be seen easily.  The UO was intubated using a 5 [place] open-ended ureteral catheter gentle retrograde pyelogram was performed to outline the collecting system.  A wire was in place  up to the level of the kidney beyond the stone.  A 6 x 24 French double-J ureteral stent was then advanced along the wire up to level the renal pelvis.  At this time, the wire was withdrawn and full coils noted within the renal pelvis as well as within the bladder.  The bladder was then drained.  The patient was then cleaned and dried, repositioned in supine position, reversed from anesthesia , and taken to PACU in stable condition.  Plan: Continue IV antibiotics.  Follow-up urine culture.  We will plan for staged outpatient procedure to treat her stone as well as metabolic evaluation for adrenal mass followed by adrenalectomy.  Hollice Espy, M.D.

## 2020-02-09 NOTE — Progress Notes (Signed)
ANTICOAGULATION CONSULT NOTE - Initial Consult  Pharmacy Consult for heparin Indication: VTE prophylaxis  No Known Allergies  Patient Measurements: Height: 5\' 9"  (175.3 cm) Weight: 97.5 kg (215 lb) IBW/kg (Calculated) : 66.2 Heparin Dosing Weight: 78 kg  Vital Signs: Temp: 98.2 F (36.8 C) (05/19 1829) Temp Source: Oral (05/19 1829) BP: 154/106 (05/20 0430) Pulse Rate: 92 (05/20 0430)  Labs: Recent Labs    02/08/20 1834  HGB 8.6*  HCT 30.0*  PLT 108*  CREATININE 1.24*    Estimated Creatinine Clearance: 74.9 mL/min (A) (by C-G formula based on SCr of 1.24 mg/dL (H)).   Medical History: Past Medical History:  Diagnosis Date  . Migraines     Medications:  Scheduled:  . potassium chloride  40 mEq Oral Once  . traZODone  200 mg Oral QHS    Assessment: Patient admitted for N/V s/t obstructive uropathy w/ h/o PE anticoagulated w/ xarelto 20 mg daily PTA. Baseline CBC low but stable for patient. Baseline stat aPTT/INR/anti-Xa pending. Patient's xarelto is being held for possible nephrostomy stent placement and being started on heparin drip for h/o PE's/VTE prophylaxis.  Goal of Therapy:  Heparin level 0.3-0.7 units/ml Monitor platelets by anticoagulation protocol: Yes   Plan:  Will omit bolus and start heparin drip at 1400 units/hr. Will check aPTT at 1300 considering baseline anti-Xa is expected to be elevated. Will dose per aPTT's and will revert to anti-Xa level dosing once both correlating. Will monitor CBC's conservatively considering it's baseline low and continue to monitor.  Tobie Lords, PharmD, BCPS Clinical Pharmacist 02/09/2020,6:27 AM

## 2020-02-09 NOTE — Consult Note (Signed)
Urology Consult  I have been asked to see the patient by Dr. Sidney Ace, for evaluation and management of left renal pelvic stone/ enlarging left adrenal mass.  Chief Complaint: nausea/ vomiting/ back pain  History of Present Illness: Katrina Page is a 39 y.o. year old female known to our practice (Dr. Bernardo Heater) for 13 mm left renal pelvic stone as well as an enlarging left adrenal mass.  She presents to the emergency room today with 3 days of worsening back pain with inability to tolerate p.o. associated with severe nausea and vomiting.  No fevers or chills.  No dysuria or gross hematuria.  She localizes the pain across her whole back that does not lateralize in particular.  Labs show leukocytosis, WBC 13.9 as well as elevated creatinine to 1.24, previous baseline 0.7.  Urinalysis has still not been collected.  CT abdomen pelvis shows the known 13 mm left renal pelvic stone associated with some renal pelvic dilation and soft tissue stranding around the renal pelvis with as well as urothelial enhancement concerning for obstruction, inflammation and possible concomitant infection.  In addition to this, she has been previously seen and evaluated for a 5.6 x 3.8 left adrenal mass.  This is subsequently enlarged to 6 x 3.9 cm on subsequent imaging based on PET scan from 08/2019.  Notably, the left adrenal nodule at that time showed no suspicious metabolic activity on PET.  She was seen and evaluated by Dr. Bernardo Heater and was subsequently referred to [facility] endocrinology for metabolic evaluation with plans for robotic adrenalectomy by me based on the size of the lesion after functional evaluation was completed.  She reports that she never followed up with [facility] endocrine because she is been feeling so poorly in general.    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  Past Medical History:  Diagnosis Date  . Migraines   PE Depression  Past  Surgical History:  Procedure Laterality Date  . CHOLECYSTECTOMY    . TUBAL LIGATION      Home Medications:  Current Meds  Medication Sig  . albuterol (VENTOLIN HFA) 108 (90 Base) MCG/ACT inhaler Inhale 1-2 puffs into the lungs every 4 (four) hours as needed for wheezing or shortness of breath.  . hydrOXYzine (ATARAX/VISTARIL) 50 MG tablet Take 1 tablet (50 mg total) by mouth 3 (three) times daily as needed for anxiety.  . Oxycodone HCl 10 MG TABS Take 1 tablet (10 mg total) by mouth every 6 (six) hours as needed (moderate to severe pain).  . rivaroxaban (XARELTO) 20 MG TABS tablet Take 1 tablet (20 mg total) by mouth daily with supper.  . traZODone (DESYREL) 100 MG tablet Take 2 tablets (200 mg total) by mouth at bedtime.  Marland Kitchen venlafaxine XR (EFFEXOR-XR) 75 MG 24 hr capsule Take 1 capsule (75 mg total) by mouth daily with breakfast.    Allergies: No Known Allergies  Family History  Problem Relation Age of Onset  . Hypertension Mother   . Diabetes Mother   . Stroke Mother   . Breast cancer Mother     Social History:  reports that she has been smoking cigarettes. She has a 10.00 pack-year smoking history. She has never used smokeless tobacco. She reports current alcohol use. She reports that she does not use drugs.  ROS: A complete review of systems was performed.  All systems are negative except for pertinent findings as noted.  Physical Exam:  Vital signs in last 24 hours: Temp:  [98.2 F (36.8 C)] 98.2 F (36.8 C) (05/19 1829) Pulse Rate:  [77-99] 99 (05/20 0630) Resp:  [16-19] 18 (05/20 0630) BP: (135-154)/(73-120) 147/84 (05/20 0630) SpO2:  [94 %-100 %] 96 % (05/20 0630) Weight:  [97.5 kg] 97.5 kg (05/19 1829) Constitutional:  Alert and oriented, appears uncomfortable HEENT: [place] AT, moist mucus membranes.  Trachea midline, no masses Cardiovascular: Regular rate and rhythm, no clubbing, cyanosis, or edema. Respiratory: Normal respiratory effort, lungs clear  bilaterally GI: Abdomen is soft, nontender, nondistended, no abdominal masses GU: Mild left CVA tenderness along with tenderness across entire back  Neurologic: Grossly intact, no focal deficits, moving all 4 extremities Psychiatric: Normal mood and affect   Laboratory Data:  Recent Labs    02/08/20 1834  WBC 13.9*  HGB 8.6*  HCT 30.0*   Recent Labs    02/08/20 1834  NA 144  K 2.4*  CL 96*  CO2 32  GLUCOSE 144*  BUN 10  CREATININE 1.24*  CALCIUM 9.3   Recent Labs    02/09/20 0644  INR 1.2   No results for input(s): LABURIN in the last 72 hours. Results for orders placed or performed during the hospital encounter of 02/08/20  SARS Coronavirus 2 by RT PCR (hospital order, performed in [facility] hospital lab) Nasopharyngeal Nasopharyngeal Swab     Status: None   Collection Time: 02/09/20  4:40 AM   Specimen: Nasopharyngeal Swab  Result Value Ref Range Status   SARS Coronavirus 2 NEGATIVE NEGATIVE Final    Comment: (NOTE) SARS-CoV-2 target nucleic acids are NOT DETECTED. The SARS-CoV-2 RNA is generally detectable in upper and lower respiratory specimens during the acute phase of infection. The lowest concentration of SARS-CoV-2 viral copies this assay can detect is 250 copies / mL. A negative result does not preclude SARS-CoV-2 infection and should not be used as the sole basis for treatment or other patient management decisions.  A negative result may occur with improper specimen collection / handling, submission of specimen other than nasopharyngeal swab, presence of viral mutation(s) within the areas targeted by this assay, and inadequate number of viral copies (<250 copies / mL). A negative result must be combined with clinical observations, patient history, and epidemiological information. Fact Sheet for Patients:   StrictlyIdeas.no Fact Sheet for Healthcare Providers: BankingDealers.co.za This test is not yet  approved or cleared  by the [place] FDA and has been authorized for detection and/or diagnosis of SARS-CoV-2 by FDA under an Emergency Use Authorization (EUA).  This EUA will remain in effect (meaning this test can be used) for the duration of the COVID-19 declaration under Section 564(b)(1) of the Act, 21 U.S.C. section 360bbb-3(b)(1), unless the authorization is terminated or revoked sooner. Performed at [facility], [address] [place],  [address]      Radiologic Imaging: CT ABDOMEN PELVIS W CONTRAST  Result Date: 02/09/2020 CLINICAL DATA:  Nausea and vomiting EXAM: CT ABDOMEN AND PELVIS WITH CONTRAST TECHNIQUE: Multidetector CT imaging of the abdomen and pelvis was performed using the standard protocol following bolus administration of intravenous contrast. CONTRAST:  129mL OMNIPAQUE IOHEXOL 300 MG/ML  SOLN COMPARISON:  11/27/2007, PET CT 08/23/2019, radiograph 12/10/2019 FINDINGS: Lower chest: Lung bases demonstrate hazy dependent atelectasis. Hepatobiliary: Hepatic steatosis. Status post cholecystectomy. No biliary dilatation Pancreas: Unremarkable. No pancreatic ductal dilatation or surrounding inflammatory changes. Spleen: Normal in size without focal abnormality. Adrenals/Urinary Tract: Stable 1.4 cm right adrenal gland nodule  suggestive of adenoma. Increased size of left adrenal mass measuring 6 x 3.9 cm, previously 5.6 x 3.8 cm. Multiple intrarenal stones bilaterally. 13 mm left renal collecting stone with obstructive changes. Soft tissue stranding about the left renal pelvis. Mild urothelial enhancement of left ureter. The bladder is unremarkable. Small cyst in the lower pole of the right kidney. Stomach/Bowel: Stomach is nonenlarged. No dilated small bowel. No bowel wall thickening. Negative appendix. Sigmoid colon diverticula without acute inflammatory change Vascular/Lymphatic: No significant vascular findings are present. No enlarged abdominal or pelvic  lymph nodes. Reproductive: Uterus and bilateral adnexa are unremarkable. Other: Negative for free air or free fluid. Musculoskeletal: No acute or significant osseous findings. IMPRESSION: 1. Negative for bowel obstruction bowel wall thickening. 2. 13 mm left renal collecting system stone with obstructive changes. There is inflammatory change about the left renal pelvis and mild urothelial enhancement of left ureter. There are multiple additional stones within the bilateral kidneys. 3. Stable 14 mm right adrenal nodule likely adenoma. Suspected interval enlargement of left adrenal mass, now measuring 6 cm. Given growth, consider surgical consultation. Electronically Signed   By: Donavan Foil M.D.   On: 02/09/2020 03:32   DG Abdomen Acute W/Chest  Result Date: 02/09/2020 CLINICAL DATA:  Persistent vomiting EXAM: DG ABDOMEN ACUTE W/ 1V CHEST COMPARISON:  04/03/2016, PET CT 08/23/2019 FINDINGS: Single-view chest demonstrates focal atelectasis or small infiltrate at the right base. Ovoid opacities in the left mid and lower thorax possibly related to healing rib fractures. Normal heart size. No pneumothorax. Supine and upright views of the abdomen demonstrate no free air beneath the diaphragm. Mildly dilated small bowel in the left mid abdominal region with relative absence of distal gas. 19 mm calcification in the left mid abdomen corresponds to a renal pelvis kidney stone IMPRESSION: 1. Small focus of atelectasis or infiltrate at the right base. Possible multiple healing left rib fractures 2. Dilated loops of small bowel in the left abdomen with relative absence of distal gas; findings could be secondary to bowel obstruction though focal or localized ileus could also produce this appearance. 3. 19 mm left kidney stone Electronically Signed   By: Donavan Foil M.D.   On: 02/09/2020 02:27   CT scan personally reviewed.  Agree with radiologic interpretation.  This was also compared to previous imaging with  interpretation as above per HPI.  Impression/Plan:   1.  Left renal pelvic stone- based on the Page of perinephric stranding and obstruction in the setting of leukocytosis and poorly controlled symptoms, I recommended urinary decompression in the form of ureteral stent.  It appears there may be also an infectious component although UA still pending.  If she is unable to void prior to the emergency room, we will collect a UA/urine culture intraoperatively.  She understands that she will need to return for staged procedure.  Given her history of PE on Xarelto as well as Toradol, she not a candidate for ESWL.  2.  Acute renal insufficiency- likely secondary to obstruction along with prerenal.  Receiving IV fluids.  Urinary decompression as above.  3.  Enlarging left adrenal mass-given the size over 5 cm as well as interval enlargement, would strongly recommend robotic left adrenalectomy due to concern for possible malignant component or potential.  No metabolic activity on PET scan which is somewhat reassuring.  We will plan to start metabolic evaluation possibly as an inpatient, to be completed by me as an outpatient and subsequent adrenalectomy following treatment of her stones as  above.  She understands and is agreeable this plan.  02/09/2020, 9:06 AM  Hollice Espy,  MD

## 2020-02-09 NOTE — H&P (Addendum)
[place] [facility] [place] NAME: Katrina Page    MR#:  [id number]  DATE OF BIRTH:  1981/03/01  DATE OF ADMISSION:  02/08/2020  PRIMARY CARE PHYSICIAN: Patient, No Pcp Per   REQUESTING/REFERRING PHYSICIAN: Hinda Kehr, MD CHIEF COMPLAINT:   Chief Complaint  Patient presents with  . Nausea  . Emesis    HISTORY OF PRESENT ILLNESS:  Katrina Page  is a 39 y.o. Caucasian female with a known history of PE, urolithiasis, depression and anxiety as well as migraine, who presented to the emergency room with acute onset of intractable nausea and vomiting of days with associated flank pain, more on the right than the left without dysuria or urinary frequency or urgency or hematuria.  She denied any dyspnea or cough or wheezing or chest pain.  No diarrhea or constipation.  No bilious vomitus or hematemesis.  Upon presentation to the emergency room, blood pressure was 143/73 and later was up to 154/106 with otherwise normal vital signs.  Labs revealed hypokalemia of 2.4 and creatinine 1.24 with a BUN of 10 magnesium 4.2 with CBC showing leukocytosis 13.9 compared to 8.1 on 01/02/2020 and anemia better than previous levels with hemoglobin of 8.6 hematocrit of 30 compared to 7.7/27.8 on 01/02/2020.  Serum pregnancy test came back negative.  Two-view abdomen x-ray showed new focus of atelectasis or infiltrate in the right base and possible multiple healing of left rib fractures.  It also showed dilated loops of small bowel in the left abdomen with relative absence of gas could be secondary to bowel obstruction or ileus.  It also showed 19 mm left kidney stone.  CT of the abdomen and pelvis showed: 1. Negative for bowel obstruction bowel wall thickening. 2. 13 mm left renal collecting system stone with obstructive changes. There is inflammatory change about the left renal pelvis and mild urothelial enhancement of left ureter. There are multiple additional stones within the bilateral  kidneys. 3. Stable 14 mm right adrenal nodule likely adenoma. Suspected interval enlargement of left adrenal mass, now measuring 6 cm. Given growth, consider surgical consultation.  The patient was given 50 mg of IV Toradol, 2.5 mg IV Inapsine, IV and p.o. potassium chloride replacement as well as 2 g of IV magnesium sulfate, 4 mg of IV Zofran and 12.5 mg IV Phenergan and 1 L bolus of IV normal saline.  She will be admitted to a medical monitored bed for further evaluation and management. PAST MEDICAL HISTORY:   Past Medical History:  Diagnosis Date  . Migraines   Urolithiasis, depression and anxiety and PE  PAST SURGICAL HISTORY:   Past Surgical History:  Procedure Laterality Date  . CHOLECYSTECTOMY    . TUBAL LIGATION      SOCIAL HISTORY:   Social History   Tobacco Use  . Smoking status: Current Every Day Smoker    Packs/day: 0.50    Years: 20.00    Pack years: 10.00    Types: Cigarettes  . Smokeless tobacco: Never Used  Substance Use Topics  . Alcohol use: Yes    Comment: 2-3 week.      FAMILY HISTORY:   Family History  Problem Relation Age of Onset  . Hypertension Mother   . Diabetes Mother   . Stroke Mother   . Breast cancer Mother     DRUG ALLERGIES:  No Known Allergies  REVIEW OF SYSTEMS:   ROS As per history of present illness. All pertinent systems were reviewed above. Constitutional,  HEENT, cardiovascular, respiratory, GI, GU, musculoskeletal, neuro, psychiatric, endocrine,  integumentary and hematologic systems were reviewed and are otherwise  negative/unremarkable except for positive findings mentioned above in the HPI.   MEDICATIONS AT HOME:   Prior to Admission medications   Medication Sig Start Date End Date Taking? Authorizing Provider  albuterol (VENTOLIN HFA) 108 (90 Base) MCG/ACT inhaler Inhale 1-2 puffs into the lungs every 4 (four) hours as needed for wheezing or shortness of breath. 01/02/20  Yes Clapacs, Madie Reno, MD  hydrOXYzine  (ATARAX/VISTARIL) 50 MG tablet Take 1 tablet (50 mg total) by mouth 3 (three) times daily as needed for anxiety. 01/02/20  Yes Clapacs, Madie Reno, MD  Oxycodone HCl 10 MG TABS Take 1 tablet (10 mg total) by mouth every 6 (six) hours as needed (moderate to severe pain). 01/02/20  Yes Clapacs, Madie Reno, MD  rivaroxaban (XARELTO) 20 MG TABS tablet Take 1 tablet (20 mg total) by mouth daily with supper. 01/02/20  Yes Clapacs, Madie Reno, MD  traZODone (DESYREL) 100 MG tablet Take 2 tablets (200 mg total) by mouth at bedtime. 01/02/20  Yes Clapacs, Madie Reno, MD  venlafaxine XR (EFFEXOR-XR) 75 MG 24 hr capsule Take 1 capsule (75 mg total) by mouth daily with breakfast. 01/02/20  Yes Clapacs, Madie Reno, MD      VITAL SIGNS:  Blood pressure (!) 154/106, pulse 92, temperature 98.2 F (36.8 C), temperature source Oral, resp. rate 19, height 5\' 9"  (1.753 m), weight 97.5 kg, last menstrual period 01/09/2020, SpO2 97 %.  PHYSICAL EXAMINATION:  Physical Exam  GENERAL:  39 y.o.-year-old Caucasian female patient lying in the bed with no acute distress.  EYES: Pupils equal, round, reactive to light and accommodation. No scleral icterus. Extraocular muscles intact.  HEENT: Head atraumatic, normocephalic. Oropharynx and nasopharynx clear.  NECK:  Supple, no jugular venous distention. No thyroid enlargement, no tenderness.  LUNGS: Normal breath sounds bilaterally, no wheezing, rales,rhonchi or crepitation. No use of accessory muscles of respiration.  CARDIOVASCULAR: Regular rate and rhythm, S1, S2 normal. No murmurs, rubs, or gallops.  ABDOMEN: Soft, nondistended, nontender. Bowel sounds present. No organomegaly or mass.  She had bilateral CVA tenderness more on the right than the left. EXTREMITIES: No pedal edema, cyanosis, or clubbing.  NEUROLOGIC: Cranial nerves II through XII are intact. Muscle strength 5/5 in all extremities. Sensation intact. Gait not checked.  PSYCHIATRIC: The patient is alert and oriented x 3.  Normal  affect and good eye contact. SKIN: No obvious rash, lesion, or ulcer.   LABORATORY PANEL:   CBC Recent Labs  Lab 02/08/20 1834  WBC 13.9*  HGB 8.6*  HCT 30.0*  PLT 108*   ------------------------------------------------------------------------------------------------------------------  Chemistries  Recent Labs  Lab 02/08/20 1834  NA 144  K 2.4*  CL 96*  CO2 32  GLUCOSE 144*  BUN 10  CREATININE 1.24*  CALCIUM 9.3  MG 1.2*  AST 24  ALT 14  ALKPHOS 68  BILITOT 2.0*   ------------------------------------------------------------------------------------------------------------------  Cardiac Enzymes No results for input(s): TROPONINI in the last 168 hours. ------------------------------------------------------------------------------------------------------------------  RADIOLOGY:  CT ABDOMEN PELVIS W CONTRAST  Result Date: 02/09/2020 CLINICAL DATA:  Nausea and vomiting EXAM: CT ABDOMEN AND PELVIS WITH CONTRAST TECHNIQUE: Multidetector CT imaging of the abdomen and pelvis was performed using the standard protocol following bolus administration of intravenous contrast. CONTRAST:  14mL OMNIPAQUE IOHEXOL 300 MG/ML  SOLN COMPARISON:  11/27/2007, PET CT 08/23/2019, radiograph 12/10/2019 FINDINGS: Lower chest: Lung bases demonstrate hazy dependent atelectasis. Hepatobiliary: Hepatic steatosis. Status  post cholecystectomy. No biliary dilatation Pancreas: Unremarkable. No pancreatic ductal dilatation or surrounding inflammatory changes. Spleen: Normal in size without focal abnormality. Adrenals/Urinary Tract: Stable 1.4 cm right adrenal gland nodule suggestive of adenoma. Increased size of left adrenal mass measuring 6 x 3.9 cm, previously 5.6 x 3.8 cm. Multiple intrarenal stones bilaterally. 13 mm left renal collecting stone with obstructive changes. Soft tissue stranding about the left renal pelvis. Mild urothelial enhancement of left ureter. The bladder is unremarkable. Small cyst in  the lower pole of the right kidney. Stomach/Bowel: Stomach is nonenlarged. No dilated small bowel. No bowel wall thickening. Negative appendix. Sigmoid colon diverticula without acute inflammatory change Vascular/Lymphatic: No significant vascular findings are present. No enlarged abdominal or pelvic lymph nodes. Reproductive: Uterus and bilateral adnexa are unremarkable. Other: Negative for free air or free fluid. Musculoskeletal: No acute or significant osseous findings. IMPRESSION: 1. Negative for bowel obstruction bowel wall thickening. 2. 13 mm left renal collecting system stone with obstructive changes. There is inflammatory change about the left renal pelvis and mild urothelial enhancement of left ureter. There are multiple additional stones within the bilateral kidneys. 3. Stable 14 mm right adrenal nodule likely adenoma. Suspected interval enlargement of left adrenal mass, now measuring 6 cm. Given growth, consider surgical consultation. Electronically Signed   By: Donavan Foil M.D.   On: 02/09/2020 03:32   DG Abdomen Acute W/Chest  Result Date: 02/09/2020 CLINICAL DATA:  Persistent vomiting EXAM: DG ABDOMEN ACUTE W/ 1V CHEST COMPARISON:  04/03/2016, PET CT 08/23/2019 FINDINGS: Single-view chest demonstrates focal atelectasis or small infiltrate at the right base. Ovoid opacities in the left mid and lower thorax possibly related to healing rib fractures. Normal heart size. No pneumothorax. Supine and upright views of the abdomen demonstrate no free air beneath the diaphragm. Mildly dilated small bowel in the left mid abdominal region with relative absence of distal gas. 19 mm calcification in the left mid abdomen corresponds to a renal pelvis kidney stone IMPRESSION: 1. Small focus of atelectasis or infiltrate at the right base. Possible multiple healing left rib fractures 2. Dilated loops of small bowel in the left abdomen with relative absence of distal gas; findings could be secondary to bowel  obstruction though focal or localized ileus could also produce this appearance. 3. 19 mm left kidney stone Electronically Signed   By: Donavan Foil M.D.   On: 02/09/2020 02:27      IMPRESSION AND PLAN:   1.  Left-sided obstructive uropathy with 13 mm stone.  The pain is likely having secondary intractable nausea and vomiting. -The patient will be admitted to medical monitored bed. -Pain management will be provided. -Urology consultation will be obtained this a.m. I notified Dr. Eliberto Ivory and Dr. Bernardo Heater that the patient. -The patient will be hydrated with IV normal saline. -She will be kept n.p.o. for now.  2.  Bilateral adrenal nodules, enlarging on the left.  This could be contributing to her intractable nausea and vomiting. -General surgery consultation will be obtained.  I notified Dr. Celine Ahr about the patient. -This could be contributing to the patient's flank pain in addition to her urolithiasis. -We will hold off Venlafaxine XR.  3.  Hypokalemia. -Potassium will be replaced.  4.  Hypomagnesemia. -Magnesium will be optimized.  5.  Hypertensive urgency. -The patient will be placed on as needed IV labetalol.  6.  History of pulmonary embolism. -We will place the patient for now on IV heparin on hold off her Xarelto.  7.  DVT  prophylaxis. -The patient will be on IV heparin.  All the records are reviewed and case discussed with ED provider. The plan of care was discussed in details with the patient (and family). I answered all questions. The patient agreed to proceed with the above mentioned plan. Further management will depend upon hospital course.   CODE STATUS: Full code  Status is: Inpatient  Remains inpatient appropriate because:Persistent severe electrolyte disturbances, Ongoing active pain requiring inpatient pain management, Ongoing diagnostic testing needed not appropriate for outpatient work up, Unsafe d/c plan, IV treatments appropriate due to intensity of illness  or inability to take PO and Inpatient level of care appropriate due to severity of illness   Dispo: The patient is from: Home              Anticipated d/c is to: Home              Anticipated d/c date is: 2 days              Patient currently is not medically stable to d/c.    TOTAL TIME TAKING CARE OF THIS PATIENT: 55 minutes.    Christel Mormon M.D on 02/09/2020 at 5:42 AM  [facility]   From 7 PM-7 AM, contact night-coverage [email or web address]  CC: Primary care physician; Patient, No Pcp Per   Note: This dictation was prepared with Dragon dictation along with smaller phrase technology. Any transcriptional errors that result from this process are unintentional.

## 2020-02-09 NOTE — OR Nursing (Signed)
Urine sample sent for UA per Dr. Erlene Quan at this time, lab called and made aware surgery is pending results.

## 2020-02-09 NOTE — Anesthesia Procedure Notes (Addendum)
Procedure Name: Intubation Date/Time: 02/09/2020 6:16 PM Performed by: Esaw Grandchild, CRNA Pre-anesthesia Checklist: Patient identified, Emergency Drugs available, Suction available and Patient being monitored Patient Re-evaluated:Patient Re-evaluated prior to induction Oxygen Delivery Method: Circle system utilized Preoxygenation: Pre-oxygenation with 100% oxygen Induction Type: IV induction, Rapid sequence and Cricoid Pressure applied Ventilation: Mask ventilation without difficulty Laryngoscope Size: McGraph and 3 Grade View: Grade I Tube type: Oral Tube size: 7.0 mm Number of attempts: 1 Airway Equipment and Method: Stylet and Oral airway Placement Confirmation: ETT inserted through vocal cords under direct vision,  positive ETCO2 and breath sounds checked- equal and bilateral Secured at: 21 cm Tube secured with: Tape Dental Injury: Teeth and Oropharynx as per pre-operative assessment

## 2020-02-09 NOTE — OR Nursing (Signed)
Dr. Bertell Maria made aware of K of 2

## 2020-02-09 NOTE — OR Nursing (Signed)
Dr. Bertell Maria aware of pt low K on 5/19. Surgery will proceed.

## 2020-02-09 NOTE — ED Notes (Signed)
Pt requesting pain medication at this time, MD made aware.

## 2020-02-09 NOTE — Anesthesia Postprocedure Evaluation (Signed)
Anesthesia Post Note  Patient: Katrina Page  Procedure(s) Performed: CYSTOSCOPY WITH STENT PLACEMENT (Left Ureter)  Patient location during evaluation: PACU Anesthesia Type: General Level of consciousness: awake and alert Pain management: pain level controlled Vital Signs Assessment: post-procedure vital signs reviewed and stable Respiratory status: spontaneous breathing and respiratory function stable Cardiovascular status: stable Anesthetic complications: no     Last Vitals:  Vitals:   02/09/20 1845 02/09/20 1900  BP: (!) 123/51 129/74  Pulse: (!) 104 99  Resp: (!) 7 19  Temp: (!) 36.2 C   SpO2: 100% 96%    Last Pain:  Vitals:   02/09/20 1845  TempSrc:   PainSc: 0-No pain                 Berl Bonfanti K

## 2020-02-10 DIAGNOSIS — D696 Thrombocytopenia, unspecified: Secondary | ICD-10-CM

## 2020-02-10 LAB — CBC
HCT: 26 % — ABNORMAL LOW (ref 36.0–46.0)
Hemoglobin: 7.4 g/dL — ABNORMAL LOW (ref 12.0–15.0)
MCH: 21.3 pg — ABNORMAL LOW (ref 26.0–34.0)
MCHC: 28.5 g/dL — ABNORMAL LOW (ref 30.0–36.0)
MCV: 74.9 fL — ABNORMAL LOW (ref 80.0–100.0)
Platelets: 70 10*3/uL — ABNORMAL LOW (ref 150–400)
RBC: 3.47 MIL/uL — ABNORMAL LOW (ref 3.87–5.11)
RDW: 24.9 % — ABNORMAL HIGH (ref 11.5–15.5)
WBC: 11.1 10*3/uL — ABNORMAL HIGH (ref 4.0–10.5)
nRBC: 0.2 % (ref 0.0–0.2)

## 2020-02-10 LAB — BASIC METABOLIC PANEL
Anion gap: 9 (ref 5–15)
BUN: 10 mg/dL (ref 6–20)
CO2: 31 mmol/L (ref 22–32)
Calcium: 8.6 mg/dL — ABNORMAL LOW (ref 8.9–10.3)
Chloride: 102 mmol/L (ref 98–111)
Creatinine, Ser: 0.86 mg/dL (ref 0.44–1.00)
GFR calc Af Amer: >60 mL/min (ref >60)
GFR calc non Af Amer: >60 mL/min (ref >60)
Glucose, Bld: 173 mg/dL — ABNORMAL HIGH (ref 70–99)
Potassium: 3 mmol/L — ABNORMAL LOW (ref 3.5–5.1)
Sodium: 142 mmol/L (ref 135–145)

## 2020-02-10 LAB — RETICULOCYTES
Immature Retic Fract: 8.9 % (ref 2.3–15.9)
RBC.: 3.49 MIL/uL — ABNORMAL LOW (ref 3.87–5.11)
Retic Count, Absolute: 9.4 10*3/uL — ABNORMAL LOW (ref 19.0–186.0)
Retic Ct Pct: <0.4 % — ABNORMAL LOW (ref 0.4–3.1)

## 2020-02-10 LAB — FERRITIN: Ferritin: 25 ng/mL (ref 11–307)

## 2020-02-10 LAB — VITAMIN B12: Vitamin B-12: 176 pg/mL — ABNORMAL LOW (ref 180–914)

## 2020-02-10 LAB — HEPARIN LEVEL (UNFRACTIONATED): Heparin Unfractionated: 0.31 IU/mL (ref 0.30–0.70)

## 2020-02-10 LAB — MAGNESIUM: Magnesium: 2.1 mg/dL (ref 1.7–2.4)

## 2020-02-10 LAB — FOLATE: Folate: 2.9 ng/mL — ABNORMAL LOW

## 2020-02-10 LAB — IRON AND TIBC
Iron: 188 ug/dL — ABNORMAL HIGH (ref 28–170)
Saturation Ratios: 46 % — ABNORMAL HIGH (ref 10.4–31.8)
TIBC: 412 ug/dL (ref 250–450)
UIBC: 224 ug/dL

## 2020-02-10 LAB — URINE CULTURE
Culture: >=100000 — AB
Special Requests: NORMAL

## 2020-02-10 MED ORDER — FOLIC ACID 1 MG PO TABS
1.0000 mg | ORAL_TABLET | Freq: Every day | ORAL | Status: DC
  Administered 2020-02-10 – 2020-02-12 (×3): 1 mg via ORAL
  Filled 2020-02-10 (×3): qty 1

## 2020-02-10 MED ORDER — BELLADONNA ALKALOIDS-OPIUM 16.2-60 MG RE SUPP: 1.0000 | Freq: Three times a day (TID) | RECTAL | Status: DC | PRN

## 2020-02-10 MED ORDER — OXYBUTYNIN CHLORIDE 5 MG PO TABS: 5.0000 mg | ORAL_TABLET | Freq: Three times a day (TID) | ORAL | Status: DC | PRN

## 2020-02-10 MED ORDER — RIVAROXABAN 20 MG PO TABS
20.0000 mg | ORAL_TABLET | Freq: Every day | ORAL | Status: DC
  Administered 2020-02-10: 20:00:00 20 mg via ORAL
  Filled 2020-02-10 (×2): qty 1

## 2020-02-10 MED ORDER — BELLADONNA ALKALOIDS-OPIUM 16.2-60 MG RE SUPP
1.0000 | Freq: Two times a day (BID) | RECTAL | Status: DC | PRN
  Filled 2020-02-10: qty 1

## 2020-02-10 MED ORDER — POTASSIUM CHLORIDE CRYS ER 20 MEQ PO TBCR
40.0000 meq | EXTENDED_RELEASE_TABLET | ORAL | Status: AC
  Filled 2020-02-10: qty 2

## 2020-02-10 NOTE — Progress Notes (Signed)
Urology Inpatient Progress Note  Subjective: Katrina Page is a 39 y.o. female admitted on 02/08/2020 with worsening back pain, nausea, and vomiting and subsequently found to have renal pelvic dilation, soft tissue stranding, and urothelial enhancement concerning for obstruction/inflammation/infection around a previously known 13 mm left renal pelvic stone as well as an enlarging left adrenal mass.  UA concerning for infection.  She is POD 1 from left ureteral stent placement with Dr. Erlene Quan.  WBC count up today, 11.1.  Hemoglobin up today, 7.4.  Creatinine down today, 0.86.  Urine culture pending, on antibiotics as below.  Today, patient reports continued back pain following stent placement.  She has been voiding spontaneously without difficulty.  Anti-infectives: Anti-infectives (From admission, onward)   Start     Dose/Rate Route Frequency Ordered Stop   02/09/20 1515  cefTRIAXone (ROCEPHIN) 2 g in sodium chloride 0.9 % 100 mL IVPB     2 g 200 mL/hr over 30 Minutes Intravenous Every 24 hours 02/09/20 1455        Current Facility-Administered Medications  Medication Dose Route Frequency Provider Last Rate Last Admin  . 0.9 %  sodium chloride infusion   Intravenous Continuous Mansy, Arvella Merles, MD 100 mL/hr at 02/10/20 0442 New Bag at 02/10/20 0442  . acetaminophen (TYLENOL) tablet 650 mg  650 mg Oral Q6H PRN Mansy, Jan A, MD       Or  . acetaminophen (TYLENOL) suppository 650 mg  650 mg Rectal Q6H PRN Mansy, Jan A, MD      . albuterol (PROVENTIL) (2.5 MG/3ML) 0.083% nebulizer solution 2.5-5 mg  2.5-5 mg Inhalation Q4H PRN Mansy, Jan A, MD      . cefTRIAXone (ROCEPHIN) 2 g in sodium chloride 0.9 % 100 mL IVPB  2 g Intravenous Q24H Lorella Nimrod, MD   Stopped at 02/09/20 1714  . hydrOXYzine (ATARAX/VISTARIL) tablet 50 mg  50 mg Oral TID PRN Mansy, Jan A, MD      . ketorolac (TORADOL) 30 MG/ML injection 15 mg  15 mg Intravenous Q6H PRN Mansy, Jan A, MD   15 mg at 02/10/20 0903  . labetalol  (NORMODYNE) injection 20 mg  20 mg Intravenous Q3H PRN Mansy, Jan A, MD      . magnesium hydroxide (MILK OF MAGNESIA) suspension 30 mL  30 mL Oral Daily PRN Mansy, Jan A, MD      . morphine 2 MG/ML injection 2 mg  2 mg Intravenous Q4H PRN Mansy, Jan A, MD   2 mg at 02/10/20 1132  . ondansetron (ZOFRAN) tablet 4 mg  4 mg Oral Q6H PRN Mansy, Jan A, MD   4 mg at 02/10/20 1022   Or  . ondansetron (ZOFRAN) injection 4 mg  4 mg Intravenous Q6H PRN Mansy, Jan A, MD   4 mg at 02/09/20 1333  . oxyCODONE (Oxy IR/ROXICODONE) immediate release tablet 10 mg  10 mg Oral Q6H PRN Mansy, Jan A, MD   10 mg at 02/10/20 0117  . potassium chloride SA (KLOR-CON) CR tablet 40 mEq  40 mEq Oral Q4H Amin, Soundra Pilon, MD      . rivaroxaban (XARELTO) tablet 20 mg  20 mg Oral Q supper Lorella Nimrod, MD      . traZODone (DESYREL) tablet 200 mg  200 mg Oral QHS Mansy, Jan A, MD   200 mg at 02/09/20 2145   Objective: Vital signs in last 24 hours: Temp:  [97.1 F (36.2 C)-98.7 F (37.1 C)] 98.1 F (36.7 C) (05/21 0745)  Pulse Rate:  [73-104] 73 (05/21 0745) Resp:  [7-23] 14 (05/21 0745) BP: (123-164)/(51-108) 143/75 (05/21 0745) SpO2:  [94 %-100 %] 96 % (05/21 0745)  Intake/Output from previous day: 05/20 0701 - 05/21 0700 In: 2419.6 [P.O.:240; I.V.:2079.6; IV Piggyback:100] Out: 500 [Urine:500] Intake/Output this shift: No intake/output data recorded.  Physical Exam Vitals and nursing note reviewed.  Constitutional:      General: She is not in acute distress.    Appearance: She is not ill-appearing, toxic-appearing or diaphoretic.     Comments: Uncomfortable appearing  HENT:     Head: Normocephalic and atraumatic.  Pulmonary:     Effort: Pulmonary effort is normal. No respiratory distress.  Skin:    General: Skin is warm and dry.  Neurological:     Mental Status: She is alert and oriented to person, place, and time.  Psychiatric:        Mood and Affect: Mood normal.        Behavior: Behavior normal.     Lab Results:  Recent Labs    02/09/20 1622 02/10/20 0338  WBC 8.7 11.1*  HGB 7.1* 7.4*  HCT 25.5* 26.0*  PLT 74* 70*   BMET Recent Labs    02/09/20 1622 02/10/20 0338  NA 142  141 142  K 3.0*  3.0* 3.0*  CL 99  98 102  CO2 30  29 31   GLUCOSE 100*  99 173*  BUN 10  11 10   CREATININE 1.01*  0.99 0.86  CALCIUM 8.3*  8.3* 8.6*   PT/INR Recent Labs    02/09/20 0644  LABPROT 14.4  INR 1.2   Assessment & Plan: 39 year old female with a previously known 13 mm left renal pelvic stone admitted with overlying infectious process and obstruction, s/p left ureteral stent placement, as well as enlarging left adrenal mass.  Urine culture pending, creatinine normalized.  Experiencing stent discomfort today.  Recommendations: -Continue antibiotics and follow urine cultures -Recommend oxybutynin and B&O suppositories for management of stent discomfort -We will arrange outpatient follow-up in 1 week to discuss definitive stone management and to begin metabolic work-up for enlarging adrenal mass  Debroah Loop, PA-C 02/10/2020

## 2020-02-10 NOTE — Progress Notes (Addendum)
PROGRESS NOTE    Katrina Page  [id number] DOB: 01-10-1981 DOA: 02/08/2020 PCP: Patient, No Pcp Per   Brief Narrative:  Katrina Page  is a 39 y.o. Caucasian female with a known history of PE, urolithiasis, depression and anxiety as well as migraine, who presented to the emergency room with acute onset of intractable nausea and vomiting of days with associated flank pain, more on the right than the left without dysuria or urinary frequency or urgency or hematuria.  CT abdomen and pelvis was negative for any small bowel obstruction  or bowel thickening.  It did show an obstructing left renal stone with some inflammatory changes.  There were multiple stones seen bilateral kidneys.  It was also positive for a stable 14 mm right adrenal nodule likely adenoma with another left-sided adrenal mass with increase in size, now measuring 6 mm which needs surgical evaluation. UA with pyuria, urine cultures pending. Urology and general surgery was consulted from ED.  Subjective: Patient continued to experience bilateral flank pain along with nausea and vomiting.  She seems in pain when seen today.  Per patient there is no change in her pain after the procedure rather it get worse.  Assessment & Plan:   Active Problems:   Left renal stone   Intractable nausea and vomiting   Hypokalemia due to excessive gastrointestinal loss of potassium   Hypomagnesemia   Left adrenal mass (HCC)   Obstructive uropathy  Left-sided obstructive uropathy with 13 mm stone.  Most likely the cause of her nausea and vomiting.  Urology was consulted and patient underwent stent placement. Continue to have worsening flank pain along with nausea and vomiting, might be stent pain. UA with pyuria, urine cultures pending. -Continue with IV hydration. -Start her on ceftriaxone-we will de-escalate once more culture results available. -Oxybutynin and B&O suppositories were added by urology.  Bilateral adrenal nodules, enlarging  on the left. No prior diagnosis.  Urology is planning to do outpatient work-up.  Might have some contribution to her current symptoms of flank pain with nausea and vomiting.  Thrombocytopenia.  Patient with worsening thrombocytopenia and anemia without any obvious bleeding.  Her platelet count was 295 1 month ago, 74 on admission and decreased to 78 this morning. Patient was started on heparin infusion yesterday for procedure as she was on Xarelto.  4T score of 1-2 points with low probability of HIT. -Hematology was consulted and they are advising checking HIT antibodies despite being low probability, which were ordered. -Anemia panel-low folate and inappropriately low reticulocyte count. -Pathologic smear review. -Start her on folic acid supplement.  Hypokalemia/hypomagnesemia.  Patient with hypokalemia and hypomagnesemia most likely secondary to GI losses. -Repleat electrolytes and monitor.  History of PE.  Patient was on Xarelto at home, which was held due to  procedure. -Discontinue heparin and restart home dose of Xarelto.  Hypertension.  Blood pressure mildly elevated.  Most likely secondary to pain.  Patient is not on any antihypertensives at home. -Continue to monitor. -Continue with IV labetalol as needed.  Elevated CBG.  No prior diagnosis of diabetes. -Check A1c  History of anxiety/depression. -Keep holding home dose of venlafaxine due to persistent nausea and vomiting.  Objective: Vitals:   02/09/20 2030 02/09/20 2144 02/10/20 0001 02/10/20 0745  BP: (!) 151/90 (!) 164/76 (!) 148/89 (!) 143/75  Pulse: 81   73  Resp: 12   14  Temp:  97.8 F (36.6 C) 98.4 F (36.9 C) 98.1 F (36.7 C)  TempSrc:  Oral  Oral Oral  SpO2: 98%   96%  Weight:      Height:        Intake/Output Summary (Last 24 hours) at 02/10/2020 1312 Last data filed at 02/10/2020 0442 Gross per 24 hour  Intake 2319.64 ml  Output 0 ml  Net 2319.64 ml   Filed Weights   02/08/20 1829  Weight: 97.5 kg      Examination:  General exam: Appears uncomfortable due to pain. Respiratory system: Clear to auscultation. Respiratory effort normal. Cardiovascular system: S1 & S2 heard, RRR.  Gastrointestinal system: Soft, bilateral CVA tenderness,nondistended, bowel sounds positive. Central nervous system: Alert and oriented. No focal neurological deficits.Symmetric 5 x 5 power. Extremities: No edema, no cyanosis, pulses intact and symmetrical. Skin: No rashes, lesions or ulcers Psychiatry: Judgement and insight appear normal. Mood & affect appropriate.    DVT prophylaxis: Xarelto Code Status: Full Family Communication: Discussed with patient Disposition Plan:  Status is: Inpatient  Remains inpatient appropriate because:Inpatient level of care appropriate due to severity of illness   Dispo: The patient is from: Home              Anticipated d/c is to: Home              Anticipated d/c date is: 1 day              Patient currently is not medically stable to d/c.  Patient continue to have pain requiring IV pain meds.  Hematology/oncology consult pending.  Consultants:   Urology  Hematology  Procedures:  Antimicrobials:  Ceftriaxone  Data Reviewed: I have personally reviewed following labs and imaging studies  CBC: Recent Labs  Lab 02/08/20 1834 02/09/20 1622 02/10/20 0338  WBC 13.9* 8.7 11.1*  HGB 8.6* 7.1* 7.4*  HCT 30.0* 25.5* 26.0*  MCV 74.1* 76.6* 74.9*  PLT 108* 74* 70*   Basic Metabolic Panel: Recent Labs  Lab 02/08/20 1834 02/09/20 1622 02/10/20 0338 02/10/20 0946  NA 144 142  141 142  --   K 2.4* 3.0*  3.0* 3.0*  --   CL 96* 99  98 102  --   CO2 32 30  29 31   --   GLUCOSE 144* 100*  99 173*  --   BUN 10 10  11 10   --   CREATININE 1.24* 1.01*  0.99 0.86  --   CALCIUM 9.3 8.3*  8.3* 8.6*  --   MG 1.2* 2.2  --  2.1   GFR: Estimated Creatinine Clearance: 108 mL/min (by C-G formula based on SCr of 0.86 mg/dL). Liver Function Tests: Recent Labs   Lab 02/08/20 1834 02/09/20 1622  AST 24 17  ALT 14 11  ALKPHOS 68 54  BILITOT 2.0* 1.6*  PROT 8.2* 6.5  ALBUMIN 4.2 3.4*   Recent Labs  Lab 02/08/20 1834  LIPASE 49   No results for input(s): AMMONIA in the last 168 hours. Coagulation Profile: Recent Labs  Lab 02/09/20 0644  INR 1.2   Cardiac Enzymes: No results for input(s): CKTOTAL, CKMB, CKMBINDEX, TROPONINI in the last 168 hours. BNP (last 3 results) No results for input(s): PROBNP in the last 8760 hours. HbA1C: No results for input(s): HGBA1C in the last 72 hours. CBG: No results for input(s): GLUCAP in the last 168 hours. Lipid Profile: No results for input(s): CHOL, HDL, LDLCALC, TRIG, CHOLHDL, LDLDIRECT in the last 72 hours. Thyroid Function Tests: No results for input(s): TSH, T4TOTAL, FREET4, T3FREE, THYROIDAB in the last 72 hours. Anemia Panel:  Recent Labs    02/10/20 0946  FOLATE 2.9*  FERRITIN 25  TIBC 412  IRON 188*  RETICCTPCT <0.4*   Sepsis Labs: No results for input(s): PROCALCITON, LATICACIDVEN in the last 168 hours.  Recent Results (from the past 240 hour(s))  SARS Coronavirus 2 by RT PCR (hospital order, performed in [facility] hospital lab) Nasopharyngeal Nasopharyngeal Swab     Status: None   Collection Time: 02/09/20  4:40 AM   Specimen: Nasopharyngeal Swab  Result Value Ref Range Status   SARS Coronavirus 2 NEGATIVE NEGATIVE Final    Comment: (NOTE) SARS-CoV-2 target nucleic acids are NOT DETECTED. The SARS-CoV-2 RNA is generally detectable in upper and lower respiratory specimens during the acute phase of infection. The lowest concentration of SARS-CoV-2 viral copies this assay can detect is 250 copies / mL. A negative result does not preclude SARS-CoV-2 infection and should not be used as the sole basis for treatment or other patient management decisions.  A negative result may occur with improper specimen collection / handling, submission of specimen other than nasopharyngeal  swab, presence of viral mutation(s) within the areas targeted by this assay, and inadequate number of viral copies (<250 copies / mL). A negative result must be combined with clinical observations, patient history, and epidemiological information. Fact Sheet for Patients:   StrictlyIdeas.no Fact Sheet for Healthcare Providers: BankingDealers.co.za This test is not yet approved or cleared  by the [place] FDA and has been authorized for detection and/or diagnosis of SARS-CoV-2 by FDA under an Emergency Use Authorization (EUA).  This EUA will remain in effect (meaning this test can be used) for the duration of the COVID-19 declaration under Section 564(b)(1) of the Act, 21 U.S.C. section 360bbb-3(b)(1), unless the authorization is terminated or revoked sooner. Performed at [facility], [address] [place], [place] [address]      Radiology Studies: CT ABDOMEN PELVIS W CONTRAST  Result Date: 02/09/2020 CLINICAL DATA:  Nausea and vomiting EXAM: CT ABDOMEN AND PELVIS WITH CONTRAST TECHNIQUE: Multidetector CT imaging of the abdomen and pelvis was performed using the standard protocol following bolus administration of intravenous contrast. CONTRAST:  128mL OMNIPAQUE IOHEXOL 300 MG/ML  SOLN COMPARISON:  11/27/2007, PET CT 08/23/2019, radiograph 12/10/2019 FINDINGS: Lower chest: Lung bases demonstrate hazy dependent atelectasis. Hepatobiliary: Hepatic steatosis. Status post cholecystectomy. No biliary dilatation Pancreas: Unremarkable. No pancreatic ductal dilatation or surrounding inflammatory changes. Spleen: Normal in size without focal abnormality. Adrenals/Urinary Tract: Stable 1.4 cm right adrenal gland nodule suggestive of adenoma. Increased size of left adrenal mass measuring 6 x 3.9 cm, previously 5.6 x 3.8 cm. Multiple intrarenal stones bilaterally. 13 mm left renal collecting stone with obstructive changes. Soft tissue  stranding about the left renal pelvis. Mild urothelial enhancement of left ureter. The bladder is unremarkable. Small cyst in the lower pole of the right kidney. Stomach/Bowel: Stomach is nonenlarged. No dilated small bowel. No bowel wall thickening. Negative appendix. Sigmoid colon diverticula without acute inflammatory change Vascular/Lymphatic: No significant vascular findings are present. No enlarged abdominal or pelvic lymph nodes. Reproductive: Uterus and bilateral adnexa are unremarkable. Other: Negative for free air or free fluid. Musculoskeletal: No acute or significant osseous findings. IMPRESSION: 1. Negative for bowel obstruction bowel wall thickening. 2. 13 mm left renal collecting system stone with obstructive changes. There is inflammatory change about the left renal pelvis and mild urothelial enhancement of left ureter. There are multiple additional stones within the bilateral kidneys. 3. Stable 14 mm right adrenal nodule likely adenoma. Suspected  interval enlargement of left adrenal mass, now measuring 6 cm. Given growth, consider surgical consultation. Electronically Signed   By: Donavan Foil M.D.   On: 02/09/2020 03:32   DG Abdomen Acute W/Chest  Result Date: 02/09/2020 CLINICAL DATA:  Persistent vomiting EXAM: DG ABDOMEN ACUTE W/ 1V CHEST COMPARISON:  04/03/2016, PET CT 08/23/2019 FINDINGS: Single-view chest demonstrates focal atelectasis or small infiltrate at the right base. Ovoid opacities in the left mid and lower thorax possibly related to healing rib fractures. Normal heart size. No pneumothorax. Supine and upright views of the abdomen demonstrate no free air beneath the diaphragm. Mildly dilated small bowel in the left mid abdominal region with relative absence of distal gas. 19 mm calcification in the left mid abdomen corresponds to a renal pelvis kidney stone IMPRESSION: 1. Small focus of atelectasis or infiltrate at the right base. Possible multiple healing left rib fractures 2.  Dilated loops of small bowel in the left abdomen with relative absence of distal gas; findings could be secondary to bowel obstruction though focal or localized ileus could also produce this appearance. 3. 19 mm left kidney stone Electronically Signed   By: Donavan Foil M.D.   On: 02/09/2020 02:27   DG OR UROLOGY CYSTO IMAGE ([facility] ONLY)  Result Date: 02/09/2020 There is no interpretation for this exam.  This order is for images obtained during a surgical procedure.  Please See "Surgeries" Tab for more information regarding the procedure.    Scheduled Meds: . potassium chloride  40 mEq Oral Q4H  . rivaroxaban  20 mg Oral Q supper  . traZODone  200 mg Oral QHS   Continuous Infusions: . sodium chloride 100 mL/hr at 02/10/20 0442  . cefTRIAXone (ROCEPHIN)  IV Stopped (02/09/20 1714)     LOS: 1 day   Time spent: 40 minutes.  Lorella Nimrod, MD [facility]  If 7PM-7AM, please contact night-coverage [email or web address]  02/10/2020, 1:12 PM   This record has been created using Systems analyst. Errors have been sought and corrected,but may not always be located. Such creation errors do not reflect on the standard of care.

## 2020-02-10 NOTE — Progress Notes (Signed)
ANTICOAGULATION CONSULT NOTE  Pharmacy Consult for heparin Indication: VTE prophylaxis  No Known Allergies  Patient Measurements: Height: 5\' 9"  (175.3 cm) Weight: 97.5 kg (215 lb) IBW/kg (Calculated) : 66.2 Heparin Dosing Weight: 78 kg  Vital Signs: Temp: 98.4 F (36.9 C) (05/21 0001) Temp Source: Oral (05/21 0001) BP: 148/89 (05/21 0001) Pulse Rate: 81 (05/20 2030)  Labs: Recent Labs    02/08/20 1834 02/08/20 1834 02/09/20 0644 02/09/20 1622 02/10/20 0338  HGB 8.6*   < >  --  7.1* 7.4*  HCT 30.0*  --   --  25.5* 26.0*  PLT 108*  --   --  74* 70*  APTT  --   --  28  --   --   LABPROT  --   --  14.4  --   --   INR  --   --  1.2  --   --   HEPARINUNFRC  --   --  <0.10* 0.18* 0.31  CREATININE 1.24*  --   --  1.01*  0.99 0.86   < > = values in this interval not displayed.    Estimated Creatinine Clearance: 108 mL/min (by C-G formula based on SCr of 0.86 mg/dL).   Medical History: Past Medical History:  Diagnosis Date  . Migraines     Medications:  Scheduled:  . fentaNYL      . potassium chloride  40 mEq Oral Once  . traZODone  200 mg Oral QHS    Assessment: Patient admitted for N/V s/t obstructive uropathy w/ h/o PE anticoagulated w/ xarelto 20 mg daily PTA. Baseline CBC low but stable for patient. Baseline stat aPTT/INR/anti-Xa pending. Patient's xarelto is being held for possible nephrostomy stent placement and being started on heparin drip for h/o PE's/VTE prophylaxis.  Goal of Therapy:  Heparin level 0.3-0.7 units/ml Monitor platelets by anticoagulation protocol: Yes   Plan:  05/21 @ 0330 HL 0.31 therapeutic. Will continue current rate and will recheck HL at 0900, CBC low, but trending stable, will continue to monitor.  Tobie Lords, PharmD, BCPS Clinical Pharmacist 02/10/2020,6:33 AM

## 2020-02-10 NOTE — Assessment & Plan Note (Addendum)
#   39 year old female patient currently in the hospital for UTI-noted to have worsening thrombocytopenia/also adrenal mass.  #Acute thrombocytopenia-Baseline platelets 240s; on admission 74-currently in the 50s; likely secondary sepsis/acute infection.  HIT antibodies negative.   #History of PE-October 2020 continue Xarelto for now.   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  Will need outpatient follow-up with endocrinology for metabolic work-up.   #UTI-sepsis/status post ureteral stent placement-improvement noted.

## 2020-02-10 NOTE — Progress Notes (Signed)
Upon entering pt room around 1630, pt stated that "No one has been in my room all day to take me to the bathroom." I reminded pt that I had been in her room multiple times today and have asked her if I could do anything for her before I left and with the exception of lunch time she stated she did not need anything. At approx 1200 when I was leaving pt room and I asked if there was anything I could do for her before I left, she stated that she needed to use the bathroom soon. I told her I could take her now, but she stated that she wanted to wait a little while and she would call. Pt never called out for restroom, but unhooked herself from wall and went to restroom without assistance. I spoke with pt about how unsafe that was, especially since she is taking pain medication so frequently. Pt educated on how to use the call bell and asked to call out if she needed to go to restroom. Call bell within reach, phone within reach, chair alarm on.

## 2020-02-10 NOTE — Consult Note (Signed)
[facility] CONSULT NOTE  Patient Care Team: Patient, No Pcp Per as PCP - General (General Practice)  CHIEF COMPLAINTS/PURPOSE OF CONSULTATION:   HISTORY OF PRESENTING ILLNESS:  Katrina Page 39 y.o.  female prior history of kidney stone; also history of renal mass is currently admitted to hospital for poor p.o. intake secondary nausea vomiting.  Also worsening abdominal pain.   Patient had a CT scan that showed enlarging left sided renal mass which 6 cm.  This was previously worked up by urology Dr. Bernardo Heater; PET scan no metabolic activity in December 2020.  Patient did not have work-up for pheochromocytoma as recommended with endocrinology.   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   On admission to hospital-patient noted to have platelet count of 74; and 7.1 MCV 76. ;  Renal function normal.    Patient underwent left retrograde pyelogram/left ureteral stent placement-currently on antibiotics symptomatically improved.   Review of Systems  Constitutional: Positive for malaise/fatigue. Negative for chills, diaphoresis and weight loss.  HENT: Negative for nosebleeds and sore throat.   Eyes: Negative for double vision.  Respiratory: Negative for cough, hemoptysis, sputum production, shortness of breath and wheezing.   Cardiovascular: Negative for chest pain, palpitations, orthopnea and leg swelling.  Gastrointestinal: Positive for abdominal pain, nausea and vomiting. Negative for blood in stool, constipation, diarrhea, heartburn and melena.  Genitourinary: Negative for dysuria, frequency and urgency.  Musculoskeletal: Positive for back pain and joint pain.  Skin: Negative.  Negative for itching and rash.  Neurological: Negative for dizziness, tingling, focal weakness, weakness and headaches.  Endo/Heme/Allergies: Does not bruise/bleed easily.  Psychiatric/Behavioral: Negative for depression. The patient is not nervous/anxious and does not have insomnia.       MEDICAL HISTORY:  Past Medical History:  Diagnosis Date  . Migraines     SURGICAL HISTORY: Past Surgical History:  Procedure Laterality Date  . CHOLECYSTECTOMY    . CYSTOSCOPY WITH STENT PLACEMENT Left 02/09/2020   Procedure: CYSTOSCOPY WITH STENT PLACEMENT;  Surgeon: Hollice Espy, MD;  Location: [facility] ORS;  Service: Urology;  Laterality: Left;  . TUBAL LIGATION      SOCIAL HISTORY: Social History   Socioeconomic History  . Marital status: Divorced    Spouse name: Not on file  . Number of children: Not on file  . Years of education: Not on file  . Highest education level: Not on file  Occupational History  . Not on file  Tobacco Use  . Smoking status: Current Every Day Smoker    Packs/day: 0.50    Years: 20.00    Pack years: 10.00    Types: Cigarettes  . Smokeless tobacco: Never Used  Substance and Sexual Activity  . Alcohol use: Yes    Alcohol/week: 2.0 - 3.0 standard drinks    Types: 2 - 3 Shots of liquor per week    Comment: 2-3 week.    . Drug use: No  . Sexual activity: Not on file  Other Topics Concern  . Not on file  Social History Narrative  . Not on file   Social Determinants of Health   Financial Resource Strain:   . Difficulty of Paying Living Expenses:   Food Insecurity:   . Worried About Charity fundraiser in the Last Year:   . Arboriculturist in the Last Year:   Transportation Needs:   . Film/video editor (Medical):   Marland Kitchen Lack of Transportation (Non-Medical):   Physical Activity:   .  Days of Exercise per Week:   . Minutes of Exercise per Session:   Stress:   . Feeling of Stress :   Social Connections:   . Frequency of Communication with Friends and Family:   . Frequency of Social Gatherings with Friends and Family:   . Attends Religious Services:   . Active Member of Clubs or Organizations:   . Attends Archivist Meetings:   Marland Kitchen Marital Status:   Intimate Partner Violence:   . Fear of Current or Ex-Partner:   .  Emotionally Abused:   Marland Kitchen Physically Abused:   . Sexually Abused:     FAMILY HISTORY: Family History  Problem Relation Age of Onset  . Hypertension Mother   . Diabetes Mother   . Stroke Mother   . Breast cancer Mother     ALLERGIES:  has No Known Allergies.  MEDICATIONS:  Current Facility-Administered Medications  Medication Dose Route Frequency Provider Last Rate Last Admin  . 0.9 %  sodium chloride infusion   Intravenous Continuous Mansy, Arvella Merles, MD 100 mL/hr at 02/10/20 0442 New Bag at 02/10/20 0442  . acetaminophen (TYLENOL) tablet 650 mg  650 mg Oral Q6H PRN Mansy, Jan A, MD       Or  . acetaminophen (TYLENOL) suppository 650 mg  650 mg Rectal Q6H PRN Mansy, Jan A, MD      . albuterol (PROVENTIL) (2.5 MG/3ML) 0.083% nebulizer solution 2.5-5 mg  2.5-5 mg Inhalation Q4H PRN Mansy, Jan A, MD      . cefTRIAXone (ROCEPHIN) 2 g in sodium chloride 0.9 % 100 mL IVPB  2 g Intravenous Q24H Lorella Nimrod, MD 200 mL/hr at 02/10/20 1640 2 g at 02/10/20 1640  . folic acid (FOLVITE) tablet 1 mg  1 mg Oral Daily Lorella Nimrod, MD   1 mg at 02/10/20 1348  . hydrOXYzine (ATARAX/VISTARIL) tablet 50 mg  50 mg Oral TID PRN Mansy, Jan A, MD      . ketorolac (TORADOL) 30 MG/ML injection 15 mg  15 mg Intravenous Q6H PRN Mansy, Jan A, MD   15 mg at 02/10/20 1639  . labetalol (NORMODYNE) injection 20 mg  20 mg Intravenous Q3H PRN Mansy, Jan A, MD      . magnesium hydroxide (MILK OF MAGNESIA) suspension 30 mL  30 mL Oral Daily PRN Mansy, Jan A, MD      . morphine 2 MG/ML injection 2 mg  2 mg Intravenous Q4H PRN Mansy, Jan A, MD   2 mg at 02/10/20 1639  . ondansetron (ZOFRAN) tablet 4 mg  4 mg Oral Q6H PRN Mansy, Jan A, MD   4 mg at 02/10/20 1022   Or  . ondansetron (ZOFRAN) injection 4 mg  4 mg Intravenous Q6H PRN Mansy, Jan A, MD   4 mg at 02/09/20 1333  . opium-belladonna (B&O) suppository 16.2-60mg   1 suppository Rectal Q12H PRN Vaillancourt, Samantha, PA-C      . oxybutynin (DITROPAN) tablet 5 mg  5 mg  Oral Q8H PRN Vaillancourt, Samantha, PA-C      . oxyCODONE (Oxy IR/ROXICODONE) immediate release tablet 10 mg  10 mg Oral Q6H PRN Mansy, Jan A, MD   10 mg at 02/10/20 1349  . rivaroxaban (XARELTO) tablet 20 mg  20 mg Oral Q supper Lorella Nimrod, MD      . traZODone (DESYREL) tablet 200 mg  200 mg Oral QHS Mansy, Jan A, MD   200 mg at 02/09/20 2145      .  PHYSICAL EXAMINATION:  Vitals:   02/10/20 0001 02/10/20 0745  BP: (!) 148/89 (!) 143/75  Pulse:  73  Resp:  14  Temp: 98.4 F (36.9 C) 98.1 F (36.7 C)  SpO2:  96%   Filed Weights   02/08/20 1829  Weight: 215 lb (97.5 kg)    Physical Exam  Constitutional: She is oriented to person, place, and time and well-developed, well-nourished, and in no distress.  Tenderness left flank.  HENT:  Head: Normocephalic and atraumatic.  Mouth/Throat: Oropharynx is clear and moist. No oropharyngeal exudate.  Eyes: Pupils are equal, round, and reactive to light.  Cardiovascular: Normal rate and regular rhythm.  Pulmonary/Chest: Effort normal and breath sounds normal. No respiratory distress. She has no wheezes.  Abdominal: Soft. Bowel sounds are normal. She exhibits no distension and no mass. There is no abdominal tenderness. There is no rebound and no guarding.  Musculoskeletal:        General: No tenderness or edema. Normal range of motion.     Cervical back: Normal range of motion and neck supple.  Neurological: She is alert and oriented to person, place, and time.  Skin: Skin is warm.  Psychiatric: Affect normal.     LABORATORY DATA:  I have reviewed the data as listed Lab Results  Component Value Date   WBC 11.1 (H) 02/10/2020   HGB 7.4 (L) 02/10/2020   HCT 26.0 (L) 02/10/2020   MCV 74.9 (L) 02/10/2020   PLT 70 (L) 02/10/2020   Recent Labs    12/29/19 0645 12/30/19 0709 02/08/20 1834 02/09/20 1622 02/10/20 0338  NA  --   --  144 142  141 142  K  --   --  2.4* 3.0*  3.0* 3.0*  CL  --   --  96* 99  98 102  CO2  --    --  32 30  29 31   GLUCOSE  --   --  144* 100*  99 173*  BUN  --   --  10 10  11 10   CREATININE  --    < > 1.24* 1.01*  0.99 0.86  CALCIUM  --   --  9.3 8.3*  8.3* 8.6*  GFRNONAA  --    < > 54* >60  >60 >60  GFRAA  --    < > >60 >60  >60 >60  PROT 7.1  --  8.2* 6.5  --   ALBUMIN 3.6  --  4.2 3.4*  --   AST 11*  --  24 17  --   ALT 9  --  14 11  --   ALKPHOS 48  --  68 54  --   BILITOT 0.6  --  2.0* 1.6*  --   BILIDIR 0.1  --   --   --   --   IBILI 0.5  --   --   --   --    < > = values in this interval not displayed.    RADIOGRAPHIC STUDIES: I have personally reviewed the radiological images as listed and agreed with the findings in the report. CT ABDOMEN PELVIS W CONTRAST  Result Date: 02/09/2020 CLINICAL DATA:  Nausea and vomiting EXAM: CT ABDOMEN AND PELVIS WITH CONTRAST TECHNIQUE: Multidetector CT imaging of the abdomen and pelvis was performed using the standard protocol following bolus administration of intravenous contrast. CONTRAST:  174mL OMNIPAQUE IOHEXOL 300 MG/ML  SOLN COMPARISON:  11/27/2007, PET CT 08/23/2019, radiograph 12/10/2019 FINDINGS: Lower chest: Lung bases  demonstrate hazy dependent atelectasis. Hepatobiliary: Hepatic steatosis. Status post cholecystectomy. No biliary dilatation Pancreas: Unremarkable. No pancreatic ductal dilatation or surrounding inflammatory changes. Spleen: Normal in size without focal abnormality. Adrenals/Urinary Tract: Stable 1.4 cm right adrenal gland nodule suggestive of adenoma. Increased size of left adrenal mass measuring 6 x 3.9 cm, previously 5.6 x 3.8 cm. Multiple intrarenal stones bilaterally. 13 mm left renal collecting stone with obstructive changes. Soft tissue stranding about the left renal pelvis. Mild urothelial enhancement of left ureter. The bladder is unremarkable. Small cyst in the lower pole of the right kidney. Stomach/Bowel: Stomach is nonenlarged. No dilated small bowel. No bowel wall thickening. Negative appendix.  Sigmoid colon diverticula without acute inflammatory change Vascular/Lymphatic: No significant vascular findings are present. No enlarged abdominal or pelvic lymph nodes. Reproductive: Uterus and bilateral adnexa are unremarkable. Other: Negative for free air or free fluid. Musculoskeletal: No acute or significant osseous findings. IMPRESSION: 1. Negative for bowel obstruction bowel wall thickening. 2. 13 mm left renal collecting system stone with obstructive changes. There is inflammatory change about the left renal pelvis and mild urothelial enhancement of left ureter. There are multiple additional stones within the bilateral kidneys. 3. Stable 14 mm right adrenal nodule likely adenoma. Suspected interval enlargement of left adrenal mass, now measuring 6 cm. Given growth, consider surgical consultation. Electronically Signed   By: Donavan Foil M.D.   On: 02/09/2020 03:32   DG Abdomen Acute W/Chest  Result Date: 02/09/2020 CLINICAL DATA:  Persistent vomiting EXAM: DG ABDOMEN ACUTE W/ 1V CHEST COMPARISON:  04/03/2016, PET CT 08/23/2019 FINDINGS: Single-view chest demonstrates focal atelectasis or small infiltrate at the right base. Ovoid opacities in the left mid and lower thorax possibly related to healing rib fractures. Normal heart size. No pneumothorax. Supine and upright views of the abdomen demonstrate no free air beneath the diaphragm. Mildly dilated small bowel in the left mid abdominal region with relative absence of distal gas. 19 mm calcification in the left mid abdomen corresponds to a renal pelvis kidney stone IMPRESSION: 1. Small focus of atelectasis or infiltrate at the right base. Possible multiple healing left rib fractures 2. Dilated loops of small bowel in the left abdomen with relative absence of distal gas; findings could be secondary to bowel obstruction though focal or localized ileus could also produce this appearance. 3. 19 mm left kidney stone Electronically Signed   By: Donavan Foil  M.D.   On: 02/09/2020 02:27   DG OR UROLOGY CYSTO IMAGE ([facility] ONLY)  Result Date: 02/09/2020 There is no interpretation for this exam.  This order is for images obtained during a surgical procedure.  Please See "Surgeries" Tab for more information regarding the procedure.    Thrombocytopenia [facility]) # 40 year old female patient currently in the hospital for UTI-noted to have worsening thrombocytopenia/also adrenal mass.  #Acute thrombocytopenia-Baseline platelets 240s; on admission 74; today 70.  Patient on IV heparin yesterday.  The etiology is unclear-include infection/medications-less likely heparin.  However recent admission to the hospital month ago/with possible exposure to heparin-recommend HIT antibodies.   #History of PE-October 2020 continue Xarelto for now.   #Anemia chronic hemoglobin 7.6 microcytic-iron deficiency versus anemia of chronic disease.  Await iron studies.  #Left adrenal mass-increasing approximately 6 in size.  Etiology is unclear. Dec 2020-negative for any lung malignancy.  Given the increasing size/malignant potential-patient will need work-up for pheochromocytoma; defer to urology/endocrinology.  Thank you for allowing me to participate in the care of your pleasant patient. Please do not hesitate to  contact me with questions or concerns in the interim.   All questions were answered. The patient knows to call the clinic with any problems, questions or concerns.    Cammie Sickle, MD 02/10/2020 5:23 PM

## 2020-02-11 ENCOUNTER — Inpatient Hospital Stay: Payer: Self-pay

## 2020-02-11 LAB — PREPARE RBC (CROSSMATCH)

## 2020-02-11 LAB — HEPATIC FUNCTION PANEL
ALT: 10 U/L (ref 0–44)
AST: 18 U/L (ref 15–41)
Albumin: 3.2 g/dL — ABNORMAL LOW (ref 3.5–5.0)
Alkaline Phosphatase: 42 U/L (ref 38–126)
Bilirubin, Direct: 0.1 mg/dL (ref 0.0–0.2)
Indirect Bilirubin: 0.6 mg/dL (ref 0.3–0.9)
Total Bilirubin: 0.7 mg/dL (ref 0.3–1.2)
Total Protein: 6.1 g/dL — ABNORMAL LOW (ref 6.5–8.1)

## 2020-02-11 LAB — BASIC METABOLIC PANEL
Anion gap: 9 (ref 5–15)
BUN: 10 mg/dL (ref 6–20)
CO2: 28 mmol/L (ref 22–32)
Calcium: 8.1 mg/dL — ABNORMAL LOW (ref 8.9–10.3)
Chloride: 106 mmol/L (ref 98–111)
Creatinine, Ser: 1.09 mg/dL — ABNORMAL HIGH (ref 0.44–1.00)
GFR calc Af Amer: >60 mL/min (ref >60)
GFR calc non Af Amer: >60 mL/min (ref >60)
Glucose, Bld: 90 mg/dL (ref 70–99)
Potassium: 2.7 mmol/L — CL (ref 3.5–5.1)
Sodium: 143 mmol/L (ref 135–145)

## 2020-02-11 LAB — CBC
HCT: 22.5 % — ABNORMAL LOW (ref 36.0–46.0)
Hemoglobin: 6.4 g/dL — ABNORMAL LOW (ref 12.0–15.0)
MCH: 21.5 pg — ABNORMAL LOW (ref 26.0–34.0)
MCHC: 28.4 g/dL — ABNORMAL LOW (ref 30.0–36.0)
MCV: 75.8 fL — ABNORMAL LOW (ref 80.0–100.0)
Platelets: 57 10*3/uL — ABNORMAL LOW (ref 150–400)
RBC: 2.97 MIL/uL — ABNORMAL LOW (ref 3.87–5.11)
RDW: 24.7 % — ABNORMAL HIGH (ref 11.5–15.5)
WBC: 7.6 10*3/uL (ref 4.0–10.5)
nRBC: 0.3 % — ABNORMAL HIGH (ref 0.0–0.2)

## 2020-02-11 LAB — MAGNESIUM: Magnesium: 1.7 mg/dL (ref 1.7–2.4)

## 2020-02-11 LAB — HEMOGLOBIN AND HEMATOCRIT, BLOOD
HCT: 26.7 % — ABNORMAL LOW (ref 36.0–46.0)
Hemoglobin: 7.9 g/dL — ABNORMAL LOW (ref 12.0–15.0)

## 2020-02-11 LAB — FIBRIN DERIVATIVES D-DIMER (ARMC ONLY): Fibrin derivatives D-dimer (ARMC): 193.65 ng/mL (FEU) (ref 0.00–499.00)

## 2020-02-11 LAB — LACTATE DEHYDROGENASE: LDH: 194 U/L — ABNORMAL HIGH (ref 98–192)

## 2020-02-11 LAB — ABO/RH: ABO/RH(D): A NEG

## 2020-02-11 LAB — FIBRINOGEN: Fibrinogen: 197 mg/dL — ABNORMAL LOW (ref 210–475)

## 2020-02-11 LAB — HEPARIN INDUCED PLATELET AB (HIT ANTIBODY): Heparin Induced Plt Ab: 0.086 OD (ref 0.000–0.400)

## 2020-02-11 MED ORDER — CYANOCOBALAMIN 1000 MCG/ML IJ SOLN
1000.0000 ug | Freq: Once | INTRAMUSCULAR | Status: AC
  Administered 2020-02-11: 1000 ug via INTRAMUSCULAR
  Filled 2020-02-11: qty 1

## 2020-02-11 MED ORDER — POTASSIUM CHLORIDE CRYS ER 20 MEQ PO TBCR
40.0000 meq | EXTENDED_RELEASE_TABLET | ORAL | Status: AC
  Administered 2020-02-11 (×2): 40 meq via ORAL
  Filled 2020-02-11 (×2): qty 2

## 2020-02-11 MED ORDER — VITAMIN B-12 1000 MCG PO TABS
1000.0000 ug | ORAL_TABLET | Freq: Every day | ORAL | Status: DC
  Administered 2020-02-12: 10:00:00 1000 ug via ORAL
  Filled 2020-02-11: qty 1

## 2020-02-11 MED ORDER — SODIUM CHLORIDE 0.9% IV SOLUTION: Freq: Once | INTRAVENOUS | Status: AC

## 2020-02-11 NOTE — Progress Notes (Signed)
PROGRESS NOTE    Katrina Page  [id number] DOB: 02/05/1981 DOA: 02/08/2020 PCP: Patient, No Pcp Per   Brief Narrative:  Katrina Page  is a 39 y.o. Caucasian female with a known history of PE, urolithiasis, depression and anxiety as well as migraine, who presented to the emergency room with acute onset of intractable nausea and vomiting of days with associated flank pain, more on the right than the left without dysuria or urinary frequency or urgency or hematuria.  CT abdomen and pelvis was negative for any small bowel obstruction  or bowel thickening.  It did show an obstructing left renal stone with some inflammatory changes.  There were multiple stones seen bilateral kidneys.  It was also positive for a stable 14 mm right adrenal nodule likely adenoma with another left-sided adrenal mass with increase in size, now measuring 6 mm which needs surgical evaluation. UA with pyuria, urine cultures pending. Urology and general surgery was consulted from ED.  Subjective: Patient continued to have bilateral flank pain.  No nausea or vomiting today.  She denies any hematemesis, melena or hematuria.  Had a small BM this morning which was normal in color.  Assessment & Plan:   Active Problems:   Left renal stone   Intractable nausea and vomiting   Hypokalemia due to excessive gastrointestinal loss of potassium   Hypomagnesemia   Left adrenal mass (HCC)   Obstructive uropathy   Thrombocytopenia (HCC)  Left-sided obstructive uropathy with 13 mm stone.  Urology was consulted and patient underwent stent placement. Continue to have bilateral flank pain , might be stent pain. UA with pyuria, urine cultures with strep agalactia. -Continue with IV hydration. -Continue ceftriaxone for now. -Oxybutynin and B&O suppositories were added by urology.  Acute anemia.  Hemoglobin 8.6 on admission. >.7.1 after stent placement,>>7.4 yesterday and dropped to 6.4 today without any obvious bleeding. Anemia  panel with low 123456 and folic acid. -Abdominal CT to rule out any retroperitoneal bleed. -Hold Xarelto. -1 unit PRBC. -Check hepatic function-if T bili is going up we will check for hemolysis. -Replace 123456 and folic acid. -Continue to monitor.  Bilateral adrenal nodules, enlarging on the left. No prior diagnosis.  Urology is planning to do outpatient work-up.  Might have some contribution to her current symptoms of flank pain with nausea and vomiting.  Thrombocytopenia.  Patient with worsening thrombocytopenia and anemia without any obvious bleeding.  Her platelet count was 295 1 month ago, 74 on admission and decreased to 78>.70>>57 this morning. Patient was started on heparin infusion  for procedure as she was on Xarelto.  4T score of 1-2 points with low probability of HIT. -Hematology was consulted and they are advising checking HIT antibodies despite being low probability, which were ordered-pending results -Anemia panel-low folate and inappropriately low reticulocyte count. -Low B12 -Pathologic smear review-pending. -Start her on folic acid supplement.  Hypokalemia/hypomagnesemia.  Patient with hypokalemia and hypomagnesemia most likely secondary to GI losses.  Potassium dropped to 2.7 again today.  Patient with adrenal mass, might be playing a role. -Repleat electrolytes and monitor.  History of PE.  Patient was on Xarelto at home, which was held due to  procedure. -Holding Xarelto due to decreased in hemoglobin today.  Hypertension.  Blood pressure mildly elevated.  Most likely secondary to pain.  Patient is not on any antihypertensives at home. -Continue to monitor. -Continue with IV labetalol as needed.  Elevated CBG.  No prior diagnosis of diabetes. -Check A1c  History of anxiety/depression. -Keep  holding home dose of venlafaxine due to persistent nausea and vomiting.  Objective: Vitals:   02/10/20 0001 02/10/20 0745 02/10/20 2322 02/11/20 0735  BP: (!) 148/89 (!)  143/75  (!) 158/78  Pulse:  73  66  Resp:  14  18  Temp: 98.4 F (36.9 C) 98.1 F (36.7 C) 98 F (36.7 C) 98.1 F (36.7 C)  TempSrc: Oral Oral Oral Oral  SpO2:  96%  100%  Weight:      Height:        Intake/Output Summary (Last 24 hours) at 02/11/2020 1152 Last data filed at 02/11/2020 1015 Gross per 24 hour  Intake 2222.17 ml  Output --  Net 2222.17 ml   Filed Weights   02/08/20 1829  Weight: 97.5 kg    Examination:  General exam: Appears uncomfortable due to pain. Respiratory system: Clear to auscultation. Respiratory effort normal. Cardiovascular system: S1 & S2 heard, RRR.  Gastrointestinal system: Soft, bilateral CVA tenderness,nondistended, bowel sounds positive. Central nervous system: Alert and oriented. No focal neurological deficits.Symmetric 5 x 5 power. Extremities: No edema, no cyanosis, pulses intact and symmetrical. Skin: No rashes, lesions or ulcers Psychiatry: Judgement and insight appear normal. Mood & affect appropriate.    DVT prophylaxis: Xarelto Code Status: Full Family Communication: Discussed with patient Disposition Plan:  Status is: Inpatient  Remains inpatient appropriate because:Inpatient level of care appropriate due to severity of illness   Dispo: The patient is from: Home              Anticipated d/c is to: Home              Anticipated d/c date is: 1 day              Patient currently is not medically stable to d/c.  Patient continue to have pain requiring IV pain meds.    Consultants:   Urology  Hematology  Procedures:  Antimicrobials:  Ceftriaxone  Data Reviewed: I have personally reviewed following labs and imaging studies  CBC: Recent Labs  Lab 02/08/20 1834 02/09/20 1622 02/10/20 0338 02/11/20 0402  WBC 13.9* 8.7 11.1* 7.6  HGB 8.6* 7.1* 7.4* 6.4*  HCT 30.0* 25.5* 26.0* 22.5*  MCV 74.1* 76.6* 74.9* 75.8*  PLT 108* 74* 70* 57*   Basic Metabolic Panel: Recent Labs  Lab 02/08/20 1834 02/09/20 1622  02/10/20 0338 02/10/20 0946 02/11/20 0402  NA 144 142  141 142  --  143  K 2.4* 3.0*  3.0* 3.0*  --  2.7*  CL 96* 99  98 102  --  106  CO2 32 30  29 31   --  28  GLUCOSE 144* 100*  99 173*  --  90  BUN 10 10  11 10   --  10  CREATININE 1.24* 1.01*  0.99 0.86  --  1.09*  CALCIUM 9.3 8.3*  8.3* 8.6*  --  8.1*  MG 1.2* 2.2  --  2.1  --    GFR: Estimated Creatinine Clearance: 85.2 mL/min (A) (by C-G formula based on SCr of 1.09 mg/dL (H)). Liver Function Tests: Recent Labs  Lab 02/08/20 1834 02/09/20 1622  AST 24 17  ALT 14 11  ALKPHOS 68 54  BILITOT 2.0* 1.6*  PROT 8.2* 6.5  ALBUMIN 4.2 3.4*   Recent Labs  Lab 02/08/20 1834  LIPASE 49   No results for input(s): AMMONIA in the last 168 hours. Coagulation Profile: Recent Labs  Lab 02/09/20 0644  INR 1.2   Cardiac Enzymes:  No results for input(s): CKTOTAL, CKMB, CKMBINDEX, TROPONINI in the last 168 hours. BNP (last 3 results) No results for input(s): PROBNP in the last 8760 hours. HbA1C: No results for input(s): HGBA1C in the last 72 hours. CBG: No results for input(s): GLUCAP in the last 168 hours. Lipid Profile: No results for input(s): CHOL, HDL, LDLCALC, TRIG, CHOLHDL, LDLDIRECT in the last 72 hours. Thyroid Function Tests: No results for input(s): TSH, T4TOTAL, FREET4, T3FREE, THYROIDAB in the last 72 hours. Anemia Panel: Recent Labs    02/10/20 0946 02/10/20 1003  VITAMINB12  --  176*  FOLATE 2.9*  --   FERRITIN 25  --   TIBC 412  --   IRON 188*  --   RETICCTPCT <0.4*  --    Sepsis Labs: No results for input(s): PROCALCITON, LATICACIDVEN in the last 168 hours.  Recent Results (from the past 240 hour(s))  SARS Coronavirus 2 by RT PCR (hospital order, performed in [facility] hospital lab) Nasopharyngeal Nasopharyngeal Swab     Status: None   Collection Time: 02/09/20  4:40 AM   Specimen: Nasopharyngeal Swab  Result Value Ref Range Status   SARS Coronavirus 2 NEGATIVE NEGATIVE Final     Comment: (NOTE) SARS-CoV-2 target nucleic acids are NOT DETECTED. The SARS-CoV-2 RNA is generally detectable in upper and lower respiratory specimens during the acute phase of infection. The lowest concentration of SARS-CoV-2 viral copies this assay can detect is 250 copies / mL. A negative result does not preclude SARS-CoV-2 infection and should not be used as the sole basis for treatment or other patient management decisions.  A negative result may occur with improper specimen collection / handling, submission of specimen other than nasopharyngeal swab, presence of viral mutation(s) within the areas targeted by this assay, and inadequate number of viral copies (<250 copies / mL). A negative result must be combined with clinical observations, patient history, and epidemiological information. Fact Sheet for Patients:   StrictlyIdeas.no Fact Sheet for Healthcare Providers: BankingDealers.co.za This test is not yet approved or cleared  by the [place] FDA and has been authorized for detection and/or diagnosis of SARS-CoV-2 by FDA under an Emergency Use Authorization (EUA).  This EUA will remain in effect (meaning this test can be used) for the duration of the COVID-19 declaration under Section 564(b)(1) of the Act, 21 U.S.C. section 360bbb-3(b)(1), unless the authorization is terminated or revoked sooner. Performed at [facility], [address], [place], [place] [address]   Urine Culture     Status: Abnormal   Collection Time: 02/09/20 12:19 PM   Specimen: Urine, Clean Catch  Result Value Ref Range Status   Specimen Description   Final    URINE, CLEAN CATCH Performed at [facility], [address], [place], [place] [address]    Special Requests   Final    Normal Performed at [facility], [address] [place], [place] [address]    Culture (A)  Final    >=100,000 COLONIES/mL GROUP B  STREP(S.AGALACTIAE)ISOLATED TESTING AGAINST S. AGALACTIAE NOT ROUTINELY PERFORMED DUE TO PREDICTABILITY OF AMP/PEN/VAN SUSCEPTIBILITY. Performed at [place] [facility], [place] [address] [place], [place] [address]    Report Status 02/10/2020 FINAL  Final     Radiology Studies: DG OR UROLOGY CYSTO IMAGE [facility] ONLY)  Result Date: 02/09/2020 There is no interpretation for this exam.  This order is for images obtained during a surgical procedure.  Please See "Surgeries" Tab for more information regarding the procedure.  Scheduled Meds: . sodium chloride   Intravenous Once  . cyanocobalamin  1,000 mcg Intramuscular Once  . folic acid  1 mg Oral Daily  . potassium chloride  40 mEq Oral Q4H  . traZODone  200 mg Oral QHS  . [START ON 02/12/2020] vitamin B-12  1,000 mcg Oral Daily   Continuous Infusions: . sodium chloride 100 mL/hr at 02/11/20 0300  . cefTRIAXone (ROCEPHIN)  IV 2 g (02/10/20 1640)     LOS: 2 days   Time spent: 40 minutes.  Lorella Nimrod, MD [facility]  If 7PM-7AM, please contact night-coverage [email or web address]  02/11/2020, 11:52 AM   This record has been created using Systems analyst. Errors have been sought and corrected,but may not always be located. Such creation errors do not reflect on the standard of care.

## 2020-02-11 NOTE — Progress Notes (Signed)
CRITICAL VALUE ALERT  Critical Value:  K+ 2.7  Date & Time Notied: 02/11/2020 [id number]  Provider Notified: NP, Rachael Fee  Orders Received/Actions taken:Orders placed. See orders. See mar.

## 2020-02-11 NOTE — Progress Notes (Signed)
Pt refuses bed alarm and chair alarm. Educated on fall risk.

## 2020-02-11 NOTE — Progress Notes (Signed)
UNMATCHED BLOOD PRODUCT NOTE  Compare the patient ID on the blood tag to the patient ID on the hospital armband and Blood Bank armband. Then confirm the unit number on the blood tag matches the unit number on the blood product.  If a discrepancy is discovered return the product to blood bank immediately.   Blood Product Type: Packed Red Blood Cells  Unit #: (Found on blood product bag, begins with [id number]  Product Code #: (Found on blood product bag, begins with E) [id number]   Start Time: 1605  Starting Rate: 120 ml/hr  Rate increase/decreased  (if applicable):      ml/hr  Rate changed time (if applicable): n/a   Stop Time: [id number]   All Other Documentation should be documented within the Blood Admin Flowsheet per policy.

## 2020-02-12 DIAGNOSIS — D696 Thrombocytopenia, unspecified: Secondary | ICD-10-CM

## 2020-02-12 LAB — PREPARE RBC (CROSSMATCH)

## 2020-02-12 LAB — BASIC METABOLIC PANEL
Anion gap: 7 (ref 5–15)
BUN: 6 mg/dL (ref 6–20)
CO2: 27 mmol/L (ref 22–32)
Calcium: 8.4 mg/dL — ABNORMAL LOW (ref 8.9–10.3)
Chloride: 109 mmol/L (ref 98–111)
Creatinine, Ser: 0.84 mg/dL (ref 0.44–1.00)
GFR calc Af Amer: >60 mL/min (ref >60)
GFR calc non Af Amer: >60 mL/min (ref >60)
Glucose, Bld: 94 mg/dL (ref 70–99)
Potassium: 3.1 mmol/L — ABNORMAL LOW (ref 3.5–5.1)
Sodium: 143 mmol/L (ref 135–145)

## 2020-02-12 LAB — MAGNESIUM: Magnesium: 1.6 mg/dL — ABNORMAL LOW (ref 1.7–2.4)

## 2020-02-12 LAB — HEMOGLOBIN AND HEMATOCRIT, BLOOD
HCT: 30 % — ABNORMAL LOW (ref 36.0–46.0)
Hemoglobin: 8.7 g/dL — ABNORMAL LOW (ref 12.0–15.0)

## 2020-02-12 LAB — CBC
HCT: 25 % — ABNORMAL LOW (ref 36.0–46.0)
Hemoglobin: 7 g/dL — ABNORMAL LOW (ref 12.0–15.0)
MCH: 21.7 pg — ABNORMAL LOW (ref 26.0–34.0)
MCHC: 28 g/dL — ABNORMAL LOW (ref 30.0–36.0)
MCV: 77.6 fL — ABNORMAL LOW (ref 80.0–100.0)
Platelets: 56 10*3/uL — ABNORMAL LOW (ref 150–400)
RBC: 3.22 MIL/uL — ABNORMAL LOW (ref 3.87–5.11)
RDW: 22.6 % — ABNORMAL HIGH (ref 11.5–15.5)
WBC: 6.3 10*3/uL (ref 4.0–10.5)
nRBC: 1.6 % — ABNORMAL HIGH (ref 0.0–0.2)

## 2020-02-12 LAB — FOLATE RBC
Folate, Hemolysate: 223 ng/mL
Folate, RBC: 826 ng/mL (ref >498)
Hematocrit: 27 % — ABNORMAL LOW (ref 34.0–46.6)

## 2020-02-12 LAB — HAPTOGLOBIN: Haptoglobin: 98 mg/dL (ref 33–278)

## 2020-02-12 MED ORDER — FOLIC ACID 1 MG PO TABS: 1.0000 mg | ORAL_TABLET | Freq: Every day | ORAL | 1 refills | Status: DC

## 2020-02-12 MED ORDER — BELLADONNA ALKALOIDS-OPIUM 16.2-60 MG RE SUPP: 1.0000 | Freq: Two times a day (BID) | RECTAL | 0 refills | Status: DC | PRN

## 2020-02-12 MED ORDER — OXYCODONE HCL 10 MG PO TABS: 10.0000 mg | ORAL_TABLET | Freq: Four times a day (QID) | ORAL | 0 refills | Status: DC | PRN

## 2020-02-12 MED ORDER — ONDANSETRON HCL 4 MG PO TABS: 4.0000 mg | ORAL_TABLET | Freq: Four times a day (QID) | ORAL | 0 refills | Status: DC | PRN

## 2020-02-12 MED ORDER — POTASSIUM CHLORIDE CRYS ER 20 MEQ PO TBCR: 20.0000 meq | EXTENDED_RELEASE_TABLET | Freq: Every day | ORAL | Status: DC

## 2020-02-12 MED ORDER — OXYBUTYNIN CHLORIDE 5 MG PO TABS: 5.0000 mg | ORAL_TABLET | Freq: Three times a day (TID) | ORAL | 0 refills | Status: DC | PRN

## 2020-02-12 MED ORDER — POTASSIUM CHLORIDE CRYS ER 20 MEQ PO TBCR: 20.0000 meq | EXTENDED_RELEASE_TABLET | Freq: Every day | ORAL | 0 refills | Status: DC

## 2020-02-12 MED ORDER — SODIUM CHLORIDE 0.9% IV SOLUTION: Freq: Once | INTRAVENOUS | Status: DC

## 2020-02-12 MED ORDER — CEPHALEXIN 500 MG PO CAPS: 500.0000 mg | ORAL_CAPSULE | Freq: Four times a day (QID) | ORAL | 0 refills | Status: AC

## 2020-02-12 MED ORDER — CYANOCOBALAMIN 1000 MCG PO TABS: 1000.0000 ug | ORAL_TABLET | Freq: Every day | ORAL | 1 refills | Status: DC

## 2020-02-12 MED ORDER — MAGNESIUM SULFATE 4 GM/100ML IV SOLN
4.0000 g | Freq: Once | INTRAVENOUS | Status: AC
  Administered 2020-02-12: 09:00:00 4 g via INTRAVENOUS
  Filled 2020-02-12: qty 100

## 2020-02-12 MED ORDER — POTASSIUM CHLORIDE 10 MEQ/100ML IV SOLN
10.0000 meq | INTRAVENOUS | Status: AC
  Administered 2020-02-12 (×2): 10 meq via INTRAVENOUS
  Filled 2020-02-12 (×2): qty 100

## 2020-02-12 NOTE — Progress Notes (Signed)
Katrina Page   DOB:December 15, 1980   [id number]    Subjective: Patient denies any fevers chills.  Denies any nausea vomiting.  Appetite is improving.  She will be walking the bathroom.  Independently.  Objective:  Vitals:   02/12/20 1512 02/12/20 1613  BP: (!) 158/77 (!) 168/99  Pulse: 72 71  Resp: 11 19  Temp: 98.1 F (36.7 C) 98.1 F (36.7 C)  SpO2: 100% 100%     Intake/Output Summary (Last 24 hours) at 02/12/2020 1724 Last data filed at 02/12/2020 1510 Gross per 24 hour  Intake 2916.95 ml  Output --  Net 2916.95 ml    Physical Exam  Constitutional: She is oriented to person, place, and time and well-developed, well-nourished, and in no distress.  HENT:  Head: Normocephalic and atraumatic.  Mouth/Throat: Oropharynx is clear and moist. No oropharyngeal exudate.  Eyes: Pupils are equal, round, and reactive to light.  Cardiovascular: Normal rate and regular rhythm.  Pulmonary/Chest: Effort normal and breath sounds normal. No respiratory distress. She has no wheezes.  Abdominal: Soft. Bowel sounds are normal. She exhibits no distension and no mass. There is no abdominal tenderness. There is no rebound and no guarding.  Musculoskeletal:        General: Tenderness present. No edema. Normal range of motion.     Cervical back: Normal range of motion and neck supple.     Comments: Tenderness noted in bilateral flank left more than right  Neurological: She is alert and oriented to person, place, and time.  Skin: Skin is warm.  Psychiatric: Affect normal.     Labs:  Lab Results  Component Value Date   WBC 6.3 02/12/2020   HGB 8.7 (L) 02/12/2020   HCT 30.0 (L) 02/12/2020   MCV 77.6 (L) 02/12/2020   PLT 56 (L) 02/12/2020   NEUTROABS 8.2 (H) 07/23/2019    Lab Results  Component Value Date   NA 143 02/12/2020   K 3.1 (L) 02/12/2020   CL 109 02/12/2020   CO2 27 02/12/2020    Studies:  CT ABDOMEN PELVIS WO CONTRAST  Result Date: 02/11/2020 CLINICAL DATA:  Anemia, recent  renal stent placement, declining hemoglobin, anticoagulation, rule out hemorrhage EXAM: CT ABDOMEN AND PELVIS WITHOUT CONTRAST TECHNIQUE: Multidetector CT imaging of the abdomen and pelvis was performed following the standard protocol without IV contrast. COMPARISON:  02/09/2020 FINDINGS: Lower chest: No acute abnormality. Hepatobiliary: No focal liver abnormality is seen. Hepatic steatosis. Status post cholecystectomy. No biliary dilatation. Pancreas: Unremarkable. No pancreatic ductal dilatation or surrounding inflammatory changes. Spleen: Normal in size without significant abnormality. Adrenals/Urinary Tract: Adrenal glands are unremarkable. Interval placement of left-sided double-J ureteral stent with formed pigtails in the renal pelvis and bladder. There remains a large calculus in the left renal pelvis. There are multiple additional tiny nonobstructive calculi bilaterally. No hydronephrosis. Bladder is unremarkable. Stomach/Bowel: Stomach is within normal limits. Appendix appears normal. No evidence of bowel wall thickening, distention, or inflammatory changes. Vascular/Lymphatic: No significant vascular findings are present. No enlarged abdominal or pelvic lymph nodes. Reproductive: No mass or other significant abnormality. Other: No abdominal wall hernia or abnormality. Trace simple attenuation free fluid in the low pelvis. Musculoskeletal: No acute or significant osseous findings. IMPRESSION: 1. Interval placement of left-sided double-J ureteral stent with formed pigtails in the renal pelvis and bladder. There remains a large calculus in the left renal pelvis. No hydronephrosis. 2. There are multiple additional tiny nonobstructive calculi bilaterally. 3. Hepatic steatosis. 4. Trace simple attenuation free fluid in the  low pelvis, either reactive or functional in the reproductive age setting, not suspicious for hemoperitoneum given attenuation. Electronically Signed   By: Eddie Candle M.D.   On: 02/11/2020  12:20    Thrombocytopenia [facility]) # 39 year old female patient currently in the hospital for UTI-noted to have worsening thrombocytopenia/also adrenal mass.  #Acute thrombocytopenia-Baseline platelets 240s; on admission 74-currently in the 50s; likely secondary sepsis/acute infection.  HIT antibodies negative.   #History of PE-October 2020 continue Xarelto for now.   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  Will need outpatient follow-up with endocrinology for metabolic work-up.   #UTI-sepsis/status post ureteral stent placement-improvement noted.   Cammie Sickle, MD 02/12/2020  5:24 PM

## 2020-02-12 NOTE — Discharge Summary (Signed)
Physician Discharge Summary  Katrina Page [id number] DOB: Mar 28, 1981 DOA: 02/08/2020  PCP: Patient, No Pcp Per  Admit date: 02/08/2020 Discharge date: 02/12/2020  Admitted From: Home Disposition:  Home  Recommendations for Outpatient Follow-up:  1. Follow up with PCP in 1-2 weeks 2. Please obtain BMP/CBC in one week 3. Please follow up on the following pending results: Haptoglobin, pathology review of smear, A1c  Home Health:No Equipment/Devices: None Discharge Condition: Stable CODE STATUS: Full Diet recommendation: Heart Healthy   Brief/Interim Summary: Katrina Page, Katrina Page, Katrina Page, who presented to the emergency room with acute onset of intractable nausea and vomiting of days with associated flank pain, more on the right than the left without dysuria or urinary frequency or urgency or hematuria.  CT abdomen and pelvis was negative for any small bowel obstruction  or bowel thickening.  It did show an obstructing left renal stone with some inflammatory changes.  There were multiple stones seen bilateral kidneys.  It was also positive for a stable 14 mm right adrenal nodule likely adenoma with another left-sided adrenal mass with increase in size, now measuring 6 mm which needs surgical evaluation. UA with pyuria, urine cultures pending. Urology was consulted and patient underwent stent placement. Continue to have bilateral flank pain , might be stent pain. UA with pyuria, urine cultures with strep agalactia.  She was treated with ceftriaxone while in the hospital and discharged on Keflex.  She will have a close follow-up with urology for further management of her renal stones.  She was also given a prescription of oxybutynin and BNO suppositories for bladder spasms to be used as needed.  She was also found to have bilateral adrenal nodules, enlarging on left.  No prior diagnosis.   Urology is planning outpatient work-up.  That might be contributing to her current symptoms of flank pain.  Patient with worsening thrombocytopenia and anemia without any obvious bleeding.  Her platelet count was 295 1 month ago, 74 on admission and decreased to 78>.70>>57>>59.  Patient was placed on Heparin infusion for procedure as he was on Xarelto before for PE.  HIT antibodies were negative.  Hematology was consulted. She will follow-up with them as an outpatient.  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  It can be restarted if needed by her hematologist are PCP.  She also developed acute on chronic anemia.  Hemoglobin was 8.6 on admission.  Dropped to 6.4.  No obvious bleeding.  CT abdomen was negative for any retroperitoneal bleed.  Per hematology most likely a sequelae of sepsis.  She was given 2 units of PRBC and will need a close monitoring of her hemoglobin.  Anemia panel with [id number] and folic acid deficiency and she was started on replacement.  Patient found to have hypokalemia and hypomagnesemia.  Continue to get low potassium despite replacement.  She was sent home on potassium supplement and will need a close monitoring of her electrolytes.  She was also found to have elevated CBG.  No prior diagnosis of diabetes.  A1c was checked pending results.  PCP should be able to follow-up.  Her blood pressure was elevated at times.  She was not on any antihypertensives at home.  Pain might be contributory.  She needs work-up for pheochromocytoma.  Patient will continue her venlafaxine.  Discharge Diagnoses:  Active Problems:   Left renal stone  Intractable nausea and vomiting   Hypokalemia due to excessive gastrointestinal loss of potassium   Hypomagnesemia   Left adrenal mass (HCC)   Obstructive uropathy   Thrombocytopenia (HCC)  Discharge Instructions  Discharge Instructions    Diet - low sodium heart healthy    Complete by: As directed    Discharge instructions   Complete by: As directed    It was pleasure taking care of you. Please take your antibiotics as directed and follow-up closely with urologist. Follow-up with your hematologist next week. I am stopping your Xarelto-discussed with your hematologist or primary care physician who can restart it if needed.  I am concerned about your low platelet and hemoglobin.   Increase activity slowly   Complete by: As directed      Allergies as of 02/12/2020   No Known Allergies     Medication List    STOP taking these medications   rivaroxaban 20 MG Tabs tablet Commonly known as: XARELTO     TAKE these medications   albuterol 108 (90 Base) MCG/ACT inhaler Commonly known as: VENTOLIN HFA Inhale 1-2 puffs into the lungs every 4 (four) hours as needed for wheezing or shortness of breath.   cephALEXin 500 MG capsule Commonly known as: KEFLEX Take 1 capsule (500 mg total) by mouth 4 (four) times daily for 10 days.   cyanocobalamin 1000 MCG tablet Take 1 tablet (1,000 mcg total) by mouth daily. Start taking on: May 24, [id number]   folic acid 1 MG tablet Commonly known as: FOLVITE Take 1 tablet (1 mg total) by mouth daily. Start taking on: Feb 13, 2020   hydrOXYzine 50 MG tablet Commonly known as: ATARAX/VISTARIL Take 1 tablet (50 mg total) by mouth 3 (three) times daily as needed for anxiety.   ondansetron 4 MG tablet Commonly known as: ZOFRAN Take 1 tablet (4 mg total) by mouth every 6 (six) hours as needed for nausea.   opium-belladonna 16.2-60 MG suppository Commonly known as: B&O SUPPRETTES Place 1 suppository rectally every 12 (twelve) hours as needed for bladder spasms.   oxybutynin 5 MG tablet Commonly known as: DITROPAN Take 1 tablet (5 mg total) by mouth every 8 (eight) hours as needed for bladder spasms.   Oxycodone HCl 10 MG Tabs Take 1 tablet (10 mg total) by mouth every 6 (six) hours as needed (moderate to severe pain).    potassium chloride SA 20 MEQ tablet Commonly known as: KLOR-CON Take 1 tablet (20 mEq total) by mouth daily. Start taking on: Feb 13, 2020   traZODone 100 MG tablet Commonly known as: DESYREL Take 2 tablets (200 mg total) by mouth at bedtime.   venlafaxine XR 75 MG 24 hr capsule Commonly known as: EFFEXOR-XR Take 1 capsule (75 mg total) by mouth daily with breakfast.      Follow-up Information    Hollice Espy, MD. Go on 02/15/2020.   Specialty: Urology Why: @ 9:15am Contact information: [place] [address] [phone]        Cammie Sickle, MD. Schedule an appointment as soon as possible for a visit.   Specialties: Internal Medicine, Oncology Contact information: [place] [address] [phone]          No Known Allergies  Consultations:  Urology  Hematology.  Procedures/Studies: CT ABDOMEN PELVIS WO CONTRAST  Result Date: 02/11/2020 CLINICAL DATA:  Anemia, recent renal stent placement, declining hemoglobin, anticoagulation, rule out hemorrhage EXAM: CT ABDOMEN AND PELVIS WITHOUT CONTRAST  TECHNIQUE: Multidetector CT imaging of the abdomen and pelvis was performed following the standard protocol without IV contrast. COMPARISON:  02/09/2020 FINDINGS: Lower chest: No acute abnormality. Hepatobiliary: No focal liver abnormality is seen. Hepatic steatosis. Status post cholecystectomy. No biliary dilatation. Pancreas: Unremarkable. No pancreatic ductal dilatation or surrounding inflammatory changes. Spleen: Normal in size without significant abnormality. Adrenals/Urinary Tract: Adrenal glands are unremarkable. Interval placement of left-sided double-J ureteral stent with formed pigtails in the renal pelvis and bladder. There remains a large calculus in the left renal pelvis. There are multiple additional tiny nonobstructive calculi bilaterally. No hydronephrosis. Bladder is unremarkable. Stomach/Bowel:  Stomach is within normal limits. Appendix appears normal. No evidence of bowel wall thickening, distention, or inflammatory changes. Vascular/Lymphatic: No significant vascular findings are present. No enlarged abdominal or pelvic lymph nodes. Reproductive: No mass or other significant abnormality. Other: No abdominal wall hernia or abnormality. Trace simple attenuation free fluid in the low pelvis. Musculoskeletal: No acute or significant osseous findings. IMPRESSION: 1. Interval placement of left-sided double-J ureteral stent with formed pigtails in the renal pelvis and bladder. There remains a large calculus in the left renal pelvis. No hydronephrosis. 2. There are multiple additional tiny nonobstructive calculi bilaterally. 3. Hepatic steatosis. 4. Trace simple attenuation free fluid in the low pelvis, either reactive or functional in the reproductive age setting, not suspicious for hemoperitoneum given attenuation. Electronically Signed   By: Eddie Candle M.D.   On: 02/11/2020 12:20   CT ABDOMEN PELVIS W CONTRAST  Result Date: 02/09/2020 CLINICAL DATA:  Nausea and vomiting EXAM: CT ABDOMEN AND PELVIS WITH CONTRAST TECHNIQUE: Multidetector CT imaging of the abdomen and pelvis was performed using the standard protocol following bolus administration of intravenous contrast. CONTRAST:  18mL OMNIPAQUE IOHEXOL 300 MG/ML  SOLN COMPARISON:  11/27/2007, PET CT 08/23/2019, radiograph 12/10/2019 FINDINGS: Lower chest: Lung bases demonstrate hazy dependent atelectasis. Hepatobiliary: Hepatic steatosis. Status post cholecystectomy. No biliary dilatation Pancreas: Unremarkable. No pancreatic ductal dilatation or surrounding inflammatory changes. Spleen: Normal in size without focal abnormality. Adrenals/Urinary Tract: Stable 1.4 cm right adrenal gland nodule suggestive of adenoma. Increased size of left adrenal mass measuring 6 x 3.9 cm, previously 5.6 x 3.8 cm. Multiple intrarenal stones bilaterally. 13 mm left renal  collecting stone with obstructive changes. Soft tissue stranding about the left renal pelvis. Mild urothelial enhancement of left ureter. The bladder is unremarkable. Small cyst in the lower pole of the right kidney. Stomach/Bowel: Stomach is nonenlarged. No dilated small bowel. No bowel wall thickening. Negative appendix. Sigmoid colon diverticula without acute inflammatory change Vascular/Lymphatic: No significant vascular findings are present. No enlarged abdominal or pelvic lymph nodes. Reproductive: Uterus and bilateral adnexa are unremarkable. Other: Negative for free air or free fluid. Musculoskeletal: No acute or significant osseous findings. IMPRESSION: 1. Negative for bowel obstruction bowel wall thickening. 2. 13 mm left renal collecting system stone with obstructive changes. There is inflammatory change about the left renal pelvis and mild urothelial enhancement of left ureter. There are multiple additional stones within the bilateral kidneys. 3. Stable 14 mm right adrenal nodule likely adenoma. Suspected interval enlargement of left adrenal mass, now measuring 6 cm. Given growth, consider surgical consultation. Electronically Signed   By: Donavan Foil M.D.   On: 02/09/2020 03:32   DG Abdomen Acute W/Chest  Result Date: 02/09/2020 CLINICAL DATA:  Persistent vomiting EXAM: DG ABDOMEN ACUTE W/ 1V CHEST COMPARISON:  04/03/2016, PET CT 08/23/2019 FINDINGS: Single-view chest demonstrates focal atelectasis or small infiltrate at the right base. Ovoid opacities  in the left mid and lower thorax possibly related to healing rib fractures. Normal heart size. No pneumothorax. Supine and upright views of the abdomen demonstrate no free air beneath the diaphragm. Mildly dilated small bowel in the left mid abdominal region with relative absence of distal gas. 19 mm calcification in the left mid abdomen corresponds to a renal pelvis kidney stone IMPRESSION: 1. Small focus of atelectasis or infiltrate at the right  base. Possible multiple healing left rib fractures 2. Dilated loops of small bowel in the left abdomen with relative absence of distal gas; findings could be secondary to bowel obstruction though focal or localized ileus could also produce this appearance. 3. 19 mm left kidney stone Electronically Signed   By: Donavan Foil M.D.   On: 02/09/2020 02:27   DG OR UROLOGY CYSTO IMAGE ([facility] ONLY)  Result Date: 02/09/2020 There is no interpretation for this exam.  This order is for images obtained during a surgical procedure.  Please See "Surgeries" Tab for more information regarding the procedure.    Subjective: Patient was feeling little better when seen today.  No nausea or vomiting.  She continued to have some bilateral flank pain, stating that it is better than before.  She would like to go back home today.  Discharge Exam: Vitals:   02/12/20 1400 02/12/20 1512  BP:  (!) 158/77  Pulse:  72  Resp:  11  Temp: 98.3 F (36.8 C) 98.1 F (36.7 C)  SpO2:  100%   Vitals:   02/12/20 1216 02/12/20 1250 02/12/20 1400 02/12/20 1512  BP: (!) 157/85 (!) 159/82  (!) 158/77  Pulse: 79 81  72  Resp: 10 14  11   Temp: 97.9 F (36.6 C) 98.2 F (36.8 C) 98.3 F (36.8 C) 98.1 F (36.7 C)  TempSrc: Oral Oral Oral Oral  SpO2: 100% 100%  100%  Weight:      Height:        General: Pt is alert, awake, not in acute distress Cardiovascular: RRR, S1/S2 +, no rubs, no gallops Respiratory: CTA bilaterally, no wheezing, no rhonchi Abdominal: Soft, NT, ND, bilateral flank tenderness, bowel sounds + Extremities: no edema, no cyanosis   The results of significant diagnostics from this hospitalization (including imaging, microbiology, ancillary and laboratory) are listed below for reference.    Microbiology: Recent Results (from the past 240 hour(s))  SARS Coronavirus 2 by RT PCR (hospital order, performed in [facility] hospital lab) Nasopharyngeal Nasopharyngeal Swab     Status: None   Collection Time:  02/09/20  4:40 AM   Specimen: Nasopharyngeal Swab  Result Value Ref Range Status   SARS Coronavirus 2 NEGATIVE NEGATIVE Final    Comment: (NOTE) SARS-CoV-2 target nucleic acids are NOT DETECTED. The SARS-CoV-2 RNA is generally detectable in upper and lower respiratory specimens during the acute phase of infection. The lowest concentration of SARS-CoV-2 viral copies this assay can detect is 250 copies / mL. A negative result does not preclude SARS-CoV-2 infection and should not be used as the sole basis for treatment or other patient management decisions.  A negative result may occur with improper specimen collection / handling, submission of specimen other than nasopharyngeal swab, presence of viral mutation(s) within the areas targeted by this assay, and inadequate number of viral copies (<250 copies / mL). A negative result must be combined with clinical observations, patient history, and epidemiological information. Fact Sheet for Patients:   StrictlyIdeas.no Fact Sheet for Healthcare Providers: BankingDealers.co.za This test is not yet  approved or cleared  by the [place] and has been authorized for detection and/or diagnosis of SARS-CoV-2 by FDA under an Emergency Use Authorization (EUA).  This EUA will remain in effect (meaning this test can be used) for the duration of the COVID-19 declaration under Section 564(b)(1) of the Act, 21 U.S.C. section 360bbb-3(b)(1), unless the authorization is terminated or revoked sooner. Performed at [facility], [address] [place], [place] [address]   Urine Culture     Status: Abnormal   Collection Time: 02/09/20 12:19 PM   Specimen: Urine, Clean Catch  Result Value Ref Range Status   Specimen Description   Final    URINE, CLEAN CATCH Performed at [facility], [address], [place], [place] [address]    Special Requests   Final    Normal Performed at  [facility], [address] [place], [place] [address]    Culture (A)  Final    >=100,000 COLONIES/mL GROUP B STREP(S.AGALACTIAE)ISOLATED TESTING AGAINST S. AGALACTIAE NOT ROUTINELY PERFORMED DUE TO PREDICTABILITY OF AMP/PEN/VAN SUSCEPTIBILITY. Performed at [facility], [place] [address], [place], [place] [address]    Report Status 02/10/2020 FINAL  Final     Labs: BNP (last 3 results) No results for input(s): BNP in the last 8760 hours. Basic Metabolic Panel: Recent Labs  Lab 02/08/20 1834 02/09/20 1622 02/10/20 0338 02/10/20 0946 02/11/20 0402 02/11/20 1119 02/12/20 0453  NA 144 142  141 142  --  143  --  143  K 2.4* 3.0*  3.0* 3.0*  --  2.7*  --  3.1*  CL 96* 99  98 102  --  106  --  109  CO2 32 30  29 31   --  28  --  27  GLUCOSE 144* 100*  99 173*  --  90  --  94  BUN 10 10  11 10   --  10  --  6  CREATININE 1.24* 1.01*  0.99 0.86  --  1.09*  --  0.84  CALCIUM 9.3 8.3*  8.3* 8.6*  --  8.1*  --  8.4*  MG 1.2* 2.2  --  2.1  --  1.7 1.6*   Liver Function Tests: Recent Labs  Lab 02/08/20 1834 02/09/20 1622 02/11/20 1119  AST 24 17 18   ALT 14 11 10   ALKPHOS 68 54 42  BILITOT 2.0* 1.6* 0.7  PROT 8.2* 6.5 6.1*  ALBUMIN 4.2 3.4* 3.2*   Recent Labs  Lab 02/08/20 1834  LIPASE 49   No results for input(s): AMMONIA in the last 168 hours. CBC: Recent Labs  Lab 02/08/20 1834 02/08/20 1834 02/09/20 1622 02/09/20 1622 02/10/20 0338 02/10/20 0946 02/11/20 0402 02/11/20 2154 02/12/20 0453  WBC 13.9*  --  8.7  --  11.1*  --  7.6  --  6.3  HGB 8.6*   < > 7.1*  --  7.4*  --  6.4* 7.9* 7.0*  HCT 30.0*   < > 25.5*   < > 26.0* 27.0* 22.5* 26.7* 25.0*  MCV 74.1*  --  76.6*  --  74.9*  --  75.8*  --  77.6*  PLT 108*  --  74*  --  70*  --  57*  --  56*   < > = values in this interval not displayed.   Cardiac Enzymes: No results for input(s): CKTOTAL, CKMB, CKMBINDEX, TROPONINI in the last 168 hours. BNP: Invalid input(s): POCBNP CBG: No  results for input(s):  GLUCAP in the last 168 hours. D-Dimer No results for input(s): DDIMER in the last 72 hours. Hgb A1c No results for input(s): HGBA1C in the last 72 hours. Lipid Profile No results for input(s): CHOL, HDL, LDLCALC, TRIG, CHOLHDL, LDLDIRECT in the last 72 hours. Thyroid function studies No results for input(s): TSH, T4TOTAL, T3FREE, THYROIDAB in the last 72 hours.  Invalid input(s): FREET3 Anemia work up Recent Labs    02/10/20 0946 02/10/20 1003  VITAMINB12  --  176*  FOLATE 2.9*  --   FERRITIN 25  --   TIBC 412  --   IRON 188*  --   RETICCTPCT <0.4*  --    Urinalysis    Component Value Date/Time   COLORURINE AMBER (A) 02/09/2020 1219   APPEARANCEUR CLOUDY (A) 02/09/2020 1219   APPEARANCEUR Hazy 01/03/2013 1856   LABSPEC >1.046 (H) 02/09/2020 1219   LABSPEC 1.024 01/03/2013 1856   PHURINE 6.0 02/09/2020 1219   GLUCOSEU 50 (A) 02/09/2020 1219   GLUCOSEU Negative 01/03/2013 1856   HGBUR SMALL (A) 02/09/2020 1219   BILIRUBINUR NEGATIVE 02/09/2020 1219   BILIRUBINUR Negative 01/03/2013 1856   KETONESUR NEGATIVE 02/09/2020 1219   PROTEINUR 100 (A) 02/09/2020 1219   NITRITE NEGATIVE 02/09/2020 1219   LEUKOCYTESUR LARGE (A) 02/09/2020 1219   LEUKOCYTESUR 1+ 01/03/2013 1856   Sepsis Labs Invalid input(s): PROCALCITONIN,  WBC,  LACTICIDVEN Microbiology Recent Results (from the past 240 hour(s))  SARS Coronavirus 2 by RT PCR (hospital order, performed in [place] hospital lab) Nasopharyngeal Nasopharyngeal Swab     Status: None   Collection Time: 02/09/20  4:40 AM   Specimen: Nasopharyngeal Swab  Result Value Ref Range Status   SARS Coronavirus 2 NEGATIVE NEGATIVE Final    Comment: (NOTE) SARS-CoV-2 target nucleic acids are NOT DETECTED. The SARS-CoV-2 RNA is generally detectable in upper and lower respiratory specimens during the acute phase of infection. The lowest concentration of SARS-CoV-2 viral copies this assay can detect is 250 copies /  mL. A negative result does not preclude SARS-CoV-2 infection and should not be used as the sole basis for treatment or other patient management decisions.  A negative result may occur with improper specimen collection / handling, submission of specimen other than nasopharyngeal swab, presence of viral mutation(s) within the areas targeted by this assay, and inadequate number of viral copies (<250 copies / mL). A negative result must be combined with clinical observations, patient history, and epidemiological information. Fact Sheet for Patients:   StrictlyIdeas.no Fact Sheet for Healthcare Providers: BankingDealers.co.za This test is not yet approved or cleared  by the [place] FDA and has been authorized for detection and/or diagnosis of SARS-CoV-2 by FDA under an Emergency Use Authorization (EUA).  This EUA will remain in effect (meaning this test can be used) for the duration of the COVID-19 declaration under Section 564(b)(1) of the Act, 21 U.S.C. section 360bbb-3(b)(1), unless the authorization is terminated or revoked sooner. Performed at [facility], [address] [place], [place] [address]   Urine Culture     Status: Abnormal   Collection Time: 02/09/20 12:19 PM   Specimen: Urine, Clean Catch  Result Value Ref Range Status   Specimen Description   Final    URINE, CLEAN CATCH Performed at [facility], [address], [place], [place] [address]    Special Requests   Final    Normal Performed at [facility], [address], [place], [place] [address]    Culture (A)  Final    >=100,000 COLONIES/mL GROUP B STREP(S.AGALACTIAE)ISOLATED TESTING AGAINST S. AGALACTIAE NOT ROUTINELY PERFORMED DUE TO PREDICTABILITY OF AMP/PEN/VAN SUSCEPTIBILITY. Performed at [facility], [place] [address] [place], [place] [address]    Report Status 02/10/2020 FINAL  Final    Time coordinating discharge:  Over 30 minutes  SIGNED:  Lorella Nimrod, MD  [facility] 02/12/2020, 3:43 PM  If 7PM-7AM, please contact night-coverage [email or web address]  This record has been created using Systems analyst. Errors have been sought and corrected,but may not always be located. Such creation errors do not reflect on the standard of care.

## 2020-02-12 NOTE — Progress Notes (Signed)
Pt adequate for discharge per Dr. Reesa Chew. AVS given, Meds returned from pharmacy.

## 2020-02-13 LAB — TYPE AND SCREEN
ABO/RH(D): A NEG
Antibody Screen: NEGATIVE
Unit division: 0
Unit division: 0

## 2020-02-13 LAB — BPAM RBC
Blood Product Expiration Date: 202105252359
Blood Product Expiration Date: 202106032359
ISSUE DATE / TIME: 202105221556
ISSUE DATE / TIME: 202105231228
Unit Type and Rh: 600
Unit Type and Rh: 9500

## 2020-02-13 LAB — PATHOLOGIST SMEAR REVIEW

## 2020-02-13 LAB — HEMOGLOBIN A1C
Hgb A1c MFr Bld: 5.2 % (ref 4.8–5.6)
Mean Plasma Glucose: 103 mg/dL

## 2020-02-14 ENCOUNTER — Other Ambulatory Visit: Payer: Self-pay

## 2020-02-14 ENCOUNTER — Inpatient Hospital Stay: Payer: Self-pay | Attending: Oncology

## 2020-02-14 ENCOUNTER — Other Ambulatory Visit: Payer: Self-pay | Admitting: Emergency Medicine

## 2020-02-14 ENCOUNTER — Other Ambulatory Visit: Payer: Self-pay | Admitting: Radiology

## 2020-02-14 DIAGNOSIS — Z86711 Personal history of pulmonary embolism: Secondary | ICD-10-CM | POA: Insufficient documentation

## 2020-02-14 DIAGNOSIS — D696 Thrombocytopenia, unspecified: Secondary | ICD-10-CM | POA: Insufficient documentation

## 2020-02-14 DIAGNOSIS — N200 Calculus of kidney: Secondary | ICD-10-CM

## 2020-02-14 DIAGNOSIS — I2694 Multiple subsegmental pulmonary emboli without acute cor pulmonale: Secondary | ICD-10-CM

## 2020-02-14 DIAGNOSIS — Z7901 Long term (current) use of anticoagulants: Secondary | ICD-10-CM | POA: Insufficient documentation

## 2020-02-14 LAB — CBC WITH DIFFERENTIAL/PLATELET
Abs Immature Granulocytes: 0.07 10*3/uL (ref 0.00–0.07)
Basophils Absolute: 0 10*3/uL (ref 0.0–0.1)
Basophils Relative: 0 %
Eosinophils Absolute: 0.4 10*3/uL (ref 0.0–0.5)
Eosinophils Relative: 6 %
HCT: 29.8 % — ABNORMAL LOW (ref 36.0–46.0)
Hemoglobin: 8.8 g/dL — ABNORMAL LOW (ref 12.0–15.0)
Immature Granulocytes: 1 %
Lymphocytes Relative: 11 %
Lymphs Abs: 0.8 10*3/uL (ref 0.7–4.0)
MCH: 23.3 pg — ABNORMAL LOW (ref 26.0–34.0)
MCHC: 29.5 g/dL — ABNORMAL LOW (ref 30.0–36.0)
MCV: 78.8 fL — ABNORMAL LOW (ref 80.0–100.0)
Monocytes Absolute: 0.5 10*3/uL (ref 0.1–1.0)
Monocytes Relative: 6 %
Neutro Abs: 5.5 10*3/uL (ref 1.7–7.7)
Neutrophils Relative %: 76 %
Platelets: 86 10*3/uL — ABNORMAL LOW (ref 150–400)
RBC: 3.78 MIL/uL — ABNORMAL LOW (ref 3.87–5.11)
RDW: 24.8 % — ABNORMAL HIGH (ref 11.5–15.5)
WBC: 7.3 10*3/uL (ref 4.0–10.5)
nRBC: 0 % (ref 0.0–0.2)

## 2020-02-14 LAB — COMPREHENSIVE METABOLIC PANEL
ALT: 13 U/L (ref 0–44)
AST: 16 U/L (ref 15–41)
Albumin: 3.5 g/dL (ref 3.5–5.0)
Alkaline Phosphatase: 42 U/L (ref 38–126)
Anion gap: 7 (ref 5–15)
BUN: 7 mg/dL (ref 6–20)
CO2: 27 mmol/L (ref 22–32)
Calcium: 8.8 mg/dL — ABNORMAL LOW (ref 8.9–10.3)
Chloride: 105 mmol/L (ref 98–111)
Creatinine, Ser: 0.87 mg/dL (ref 0.44–1.00)
GFR calc Af Amer: >60 mL/min (ref >60)
GFR calc non Af Amer: >60 mL/min (ref >60)
Glucose, Bld: 112 mg/dL — ABNORMAL HIGH (ref 70–99)
Potassium: 3.6 mmol/L (ref 3.5–5.1)
Sodium: 139 mmol/L (ref 135–145)
Total Bilirubin: 0.9 mg/dL (ref 0.3–1.2)
Total Protein: 6.3 g/dL — ABNORMAL LOW (ref 6.5–8.1)

## 2020-02-14 NOTE — Progress Notes (Signed)
02/15/20 1:08 PM   [name] Dec 01, 1980 [id number]  Referring provider: No referring provider defined for this encounter. Chief Complaint  Patient presents with  . recheck urine    HPI: Katrina Page is a 39 y.o. F who returns today for the evaluation and management of nephrolithiasis.  She was recently hospitalized with sepsis and urinary obstruction status post emergent left ureteral stent placement.  CT A/P wo contrast from 02/11/20 revealed multiple additional tiny nonobstructive calculi bilaterally. Also noted interval placement of left-sided double-J ureteral stent w/ formed pigtails in the renal pelvis and bladder. There remains a large calculus in the left renal pelvis. No hydronephrosis.  She was seen by hematology as an inpatient and stopped Xarelto which she took for 6 months.   She recently was discharged on Keflex for 10 days. She is still nauseous however not vomiting.   She reports of left flank pain and is requesting a refill of her narcotics.   She also has a an enlarging left adrenal mass.  Was noted initial consultation by Dr. Bernardo Heater as outpatient and had intervally enlarged on her most recent hospitalization greater than 6 cm.  She was referred to endocrinology for metabolic evaluation but never followed through with this.  PMH: Past Medical History:  Diagnosis Date  . Migraines     Surgical History: Past Surgical History:  Procedure Laterality Date  . CHOLECYSTECTOMY    . CYSTOSCOPY WITH STENT PLACEMENT Left 02/09/2020   Procedure: CYSTOSCOPY WITH STENT PLACEMENT;  Surgeon: Hollice Espy, MD;  Location: [facility] ORS;  Service: Urology;  Laterality: Left;  . TUBAL LIGATION      Home Medications:  Allergies as of 02/15/2020   No Known Allergies     Medication List       Accurate as of Feb 15, 2020  1:08 PM. If you have any questions, ask your nurse or doctor.        albuterol 108 (90 Base) MCG/ACT inhaler Commonly known as: VENTOLIN  HFA Inhale 1-2 puffs into the lungs every 4 (four) hours as needed for wheezing or shortness of breath.   cephALEXin 500 MG capsule Commonly known as: KEFLEX Take 1 capsule (500 mg total) by mouth 4 (four) times daily for 10 days.   cyanocobalamin 1000 MCG tablet Take 1 tablet (1,000 mcg total) by mouth daily.   folic acid 1 MG tablet Commonly known as: FOLVITE Take 1 tablet (1 mg total) by mouth daily.   hydrOXYzine 50 MG tablet Commonly known as: ATARAX/VISTARIL Take 1 tablet (50 mg total) by mouth 3 (three) times daily as needed for anxiety.   ondansetron 4 MG tablet Commonly known as: ZOFRAN Take 1 tablet (4 mg total) by mouth every 6 (six) hours as needed for nausea.   opium-belladonna 16.2-60 MG suppository Commonly known as: B&O SUPPRETTES Place 1 suppository rectally every 12 (twelve) hours as needed for bladder spasms.   oxybutynin 5 MG tablet Commonly known as: DITROPAN Take 1 tablet (5 mg total) by mouth every 8 (eight) hours as needed for bladder spasms.   Oxycodone HCl 10 MG Tabs Take 1 tablet (10 mg total) by mouth every 6 (six) hours as needed (moderate to severe pain).   potassium chloride SA 20 MEQ tablet Commonly known as: KLOR-CON Take 1 tablet (20 mEq total) by mouth daily.   traZODone 100 MG tablet Commonly known as: DESYREL Take 2 tablets (200 mg total) by mouth at bedtime.   venlafaxine XR 75 MG 24 hr  capsule Commonly known as: EFFEXOR-XR Take 1 capsule (75 mg total) by mouth daily with breakfast.       Allergies: No Known Allergies  Family History: Family History  Problem Relation Age of Onset  . Hypertension Mother   . Diabetes Mother   . Stroke Mother   . Breast cancer Mother     Social History:  reports that she has been smoking cigarettes. She has a 10.00 pack-year smoking history. She has never used smokeless tobacco. She reports current alcohol use of about 2.0 - 3.0 standard drinks of alcohol per week. She reports that she does  not use drugs.   Physical Exam: BP (!) 166/98   Pulse 82   Wt 212 lb (96.2 kg)   BMI 31.31 kg/m   Constitutional:  Alert and oriented, No acute distress. HEENT: [place] AT, moist mucus membranes.  Trachea midline, no masses. Cardiovascular: No clubbing, cyanosis, or edema. Respiratory: Normal respiratory effort, no increased work of breathing. Skin: No rashes, bruises or suspicious lesions. Neurologic: Grossly intact, no focal deficits, moving all 4 extremities. Psychiatric: Normal mood and affect.  Laboratory Data:  Lab Results  Component Value Date   CREATININE 0.87 02/14/2020    Lab Results  Component Value Date   HGBA1C 5.2 02/10/2020    Urinalysis Urinalysis - pending   Pertinent Imaging: CLINICAL DATA:  Anemia, recent renal stent placement, declining hemoglobin, anticoagulation, rule out hemorrhage  EXAM: CT ABDOMEN AND PELVIS WITHOUT CONTRAST  TECHNIQUE: Multidetector CT imaging of the abdomen and pelvis was performed following the standard protocol without IV contrast.  COMPARISON:  02/09/2020  FINDINGS: Lower chest: No acute abnormality.  Hepatobiliary: No focal liver abnormality is seen. Hepatic steatosis. Status post cholecystectomy. No biliary dilatation.  Pancreas: Unremarkable. No pancreatic ductal dilatation or surrounding inflammatory changes.  Spleen: Normal in size without significant abnormality.  Adrenals/Urinary Tract: Adrenal glands are unremarkable. Interval placement of left-sided double-J ureteral stent with formed pigtails in the renal pelvis and bladder. There remains a large calculus in the left renal pelvis. There are multiple additional tiny nonobstructive calculi bilaterally. No hydronephrosis. Bladder is unremarkable.  Stomach/Bowel: Stomach is within normal limits. Appendix appears normal. No evidence of bowel wall thickening, distention, or inflammatory changes.  Vascular/Lymphatic: No significant vascular  findings are present. No enlarged abdominal or pelvic lymph nodes.  Reproductive: No mass or other significant abnormality.  Other: No abdominal wall hernia or abnormality. Trace simple attenuation free fluid in the low pelvis.  Musculoskeletal: No acute or significant osseous findings.  IMPRESSION: 1. Interval placement of left-sided double-J ureteral stent with formed pigtails in the renal pelvis and bladder. There remains a large calculus in the left renal pelvis. No hydronephrosis. 2. There are multiple additional tiny nonobstructive calculi bilaterally. 3. Hepatic steatosis. 4. Trace simple attenuation free fluid in the low pelvis, either reactive or functional in the reproductive age setting, not suspicious for hemoperitoneum given attenuation.   Electronically Signed   By: Eddie Candle M.D.   On: 02/11/2020 12:20  I have personally reviewed the images and agree with radiologist interpretation.   Assessment & Plan:    1. Left ureteral calculus CT A/P revealed large calculus in left renal pelvis  We discussed ureteroscopy, laser lithotripsy, and stent. We discussed the risks and benefits of both including bleeding, infection, damage to surrounding structures, efficacy with need for possible further intervention, and need for temporary ureteral stent. Preop UCx again today, complete current abx course  2. Adrenal mass  Needs metabolic  evaluation  Given her acute illness, will hold off until treatment of stone Eventual left adrenalectomy planned  3. Left flank pain  Pt is requesting refill of narcotics Rx of Oxycodone x 10 tablets to hold her till surgery     [facility] [address] [place], [place] [address] [phone]  I, Lucas Mallow, am acting as a scribe for Dr. Hollice Espy,  I have reviewed the above documentation for accuracy and completeness, and I agree with the above.   Hollice Espy,  MD

## 2020-02-14 NOTE — H&P (View-Only) (Signed)
02/15/20 1:08 PM   [name] [birth year] [id number]  Referring provider: No referring provider defined for this encounter. Chief Complaint  Patient presents with  . recheck urine    HPI: Katrina Page is a 39 y.o. F who returns today for the evaluation and management of nephrolithiasis.  She was recently hospitalized with sepsis and urinary obstruction status post emergent left ureteral stent placement.  CT A/P wo contrast from 02/11/20 revealed multiple additional tiny nonobstructive calculi bilaterally. Also noted interval placement of left-sided double-J ureteral stent w/ formed pigtails in the renal pelvis and bladder. There remains a large calculus in the left renal pelvis. No hydronephrosis.  She was seen by hematology as an inpatient and stopped Xarelto which she took for 6 months.   She recently was discharged on Keflex for 10 days. She is still nauseous however not vomiting.   She reports of left flank pain and is requesting a refill of her narcotics.   She also has a an enlarging left adrenal mass.  Was noted initial consultation by Dr. Bernardo Heater as outpatient and had intervally enlarged on her most recent hospitalization greater than 6 cm.  She was referred to endocrinology for metabolic evaluation but never followed through with this.  PMH: Past Medical History:  Diagnosis Date  . Migraines     Surgical History: Past Surgical History:  Procedure Laterality Date  . CHOLECYSTECTOMY    . CYSTOSCOPY WITH STENT PLACEMENT Left 02/09/2020   Procedure: CYSTOSCOPY WITH STENT PLACEMENT;  Surgeon: Hollice Espy, MD;  Location: [facility] ORS;  Service: Urology;  Laterality: Left;  . TUBAL LIGATION      Home Medications:  Allergies as of 02/15/2020   No Known Allergies     Medication List       Accurate as of Feb 15, 2020  1:08 PM. If you have any questions, ask your nurse or doctor.        albuterol 108 (90 Base) MCG/ACT inhaler Commonly known as: VENTOLIN HFA  Inhale 1-2 puffs into the lungs every 4 (four) hours as needed for wheezing or shortness of breath.   cephALEXin 500 MG capsule Commonly known as: KEFLEX Take 1 capsule (500 mg total) by mouth 4 (four) times daily for 10 days.   cyanocobalamin 1000 MCG tablet Take 1 tablet (1,000 mcg total) by mouth daily.   folic acid 1 MG tablet Commonly known as: FOLVITE Take 1 tablet (1 mg total) by mouth daily.   hydrOXYzine 50 MG tablet Commonly known as: ATARAX/VISTARIL Take 1 tablet (50 mg total) by mouth 3 (three) times daily as needed for anxiety.   ondansetron 4 MG tablet Commonly known as: ZOFRAN Take 1 tablet (4 mg total) by mouth every 6 (six) hours as needed for nausea.   opium-belladonna 16.2-60 MG suppository Commonly known as: B&O SUPPRETTES Place 1 suppository rectally every 12 (twelve) hours as needed for bladder spasms.   oxybutynin 5 MG tablet Commonly known as: DITROPAN Take 1 tablet (5 mg total) by mouth every 8 (eight) hours as needed for bladder spasms.   Oxycodone HCl 10 MG Tabs Take 1 tablet (10 mg total) by mouth every 6 (six) hours as needed (moderate to severe pain).   potassium chloride SA 20 MEQ tablet Commonly known as: KLOR-CON Take 1 tablet (20 mEq total) by mouth daily.   traZODone 100 MG tablet Commonly known as: DESYREL Take 2 tablets (200 mg total) by mouth at bedtime.   venlafaxine XR 75 MG 24 hr  capsule Commonly known as: EFFEXOR-XR Take 1 capsule (75 mg total) by mouth daily with breakfast.       Allergies: No Known Allergies  Family History: Family History  Problem Relation Age of Onset  . Hypertension Mother   . Diabetes Mother   . Stroke Mother   . Breast cancer Mother     Social History:  reports that she has been smoking cigarettes. She has a 10.00 pack-year smoking history. She has never used smokeless tobacco. She reports current alcohol use of about 2.0 - 3.0 standard drinks of alcohol per week. She reports that she does not  use drugs.   Physical Exam: BP (!) 166/98   Pulse 82   Wt 212 lb (96.2 kg)   BMI 31.31 kg/m   Constitutional:  Alert and oriented, No acute distress. HEENT: [place] AT, moist mucus membranes.  Trachea midline, no masses. Cardiovascular: No clubbing, cyanosis, or edema. Respiratory: Normal respiratory effort, no increased work of breathing. Skin: No rashes, bruises or suspicious lesions. Neurologic: Grossly intact, no focal deficits, moving all 4 extremities. Psychiatric: Normal mood and affect.  Laboratory Data:  Lab Results  Component Value Date   CREATININE 0.87 02/14/2020    Lab Results  Component Value Date   HGBA1C 5.2 02/10/2020    Urinalysis Urinalysis - pending   Pertinent Imaging: CLINICAL DATA:  Anemia, recent renal stent placement, declining hemoglobin, anticoagulation, rule out hemorrhage  EXAM: CT ABDOMEN AND PELVIS WITHOUT CONTRAST  TECHNIQUE: Multidetector CT imaging of the abdomen and pelvis was performed following the standard protocol without IV contrast.  COMPARISON:  02/09/2020  FINDINGS: Lower chest: No acute abnormality.  Hepatobiliary: No focal liver abnormality is seen. Hepatic steatosis. Status post cholecystectomy. No biliary dilatation.  Pancreas: Unremarkable. No pancreatic ductal dilatation or surrounding inflammatory changes.  Spleen: Normal in size without significant abnormality.  Adrenals/Urinary Tract: Adrenal glands are unremarkable. Interval placement of left-sided double-J ureteral stent with formed pigtails in the renal pelvis and bladder. There remains a large calculus in the left renal pelvis. There are multiple additional tiny nonobstructive calculi bilaterally. No hydronephrosis. Bladder is unremarkable.  Stomach/Bowel: Stomach is within normal limits. Appendix appears normal. No evidence of bowel wall thickening, distention, or inflammatory changes.  Vascular/Lymphatic: No significant vascular findings  are present. No enlarged abdominal or pelvic lymph nodes.  Reproductive: No mass or other significant abnormality.  Other: No abdominal wall hernia or abnormality. Trace simple attenuation free fluid in the low pelvis.  Musculoskeletal: No acute or significant osseous findings.  IMPRESSION: 1. Interval placement of left-sided double-J ureteral stent with formed pigtails in the renal pelvis and bladder. There remains a large calculus in the left renal pelvis. No hydronephrosis. 2. There are multiple additional tiny nonobstructive calculi bilaterally. 3. Hepatic steatosis. 4. Trace simple attenuation free fluid in the low pelvis, either reactive or functional in the reproductive age setting, not suspicious for hemoperitoneum given attenuation.   Electronically Signed   By: Eddie Candle M.D.   On: 02/11/2020 12:20  I have personally reviewed the images and agree with radiologist interpretation.   Assessment & Plan:    1. Left ureteral calculus CT A/P revealed large calculus in left renal pelvis  We discussed ureteroscopy, laser lithotripsy, and stent. We discussed the risks and benefits of both including bleeding, infection, damage to surrounding structures, efficacy with need for possible further intervention, and need for temporary ureteral stent. Preop UCx again today, complete current abx course  2. Adrenal mass  Needs metabolic  evaluation  Given her acute illness, will hold off until treatment of stone Eventual left adrenalectomy planned  3. Left flank pain  Pt is requesting refill of narcotics Rx of Oxycodone x 10 tablets to hold her till surgery     [facility] [address], [place], [place] [address] [phone]  I, Lucas Mallow, am acting as a scribe for Dr. Hollice Espy,  I have reviewed the above documentation for accuracy and completeness, and I agree with the above.   Hollice Espy, MD

## 2020-02-15 ENCOUNTER — Ambulatory Visit (INDEPENDENT_AMBULATORY_CARE_PROVIDER_SITE_OTHER): Payer: Self-pay | Admitting: Urology

## 2020-02-15 ENCOUNTER — Encounter: Payer: Self-pay | Admitting: Urology

## 2020-02-15 ENCOUNTER — Other Ambulatory Visit: Payer: Self-pay

## 2020-02-15 VITALS — BP 166/98 | HR 82 | Wt 212.0 lb

## 2020-02-15 DIAGNOSIS — E278 Other specified disorders of adrenal gland: Secondary | ICD-10-CM

## 2020-02-15 LAB — URINALYSIS, COMPLETE
Bilirubin, UA: NEGATIVE
Glucose, UA: NEGATIVE
Ketones, UA: NEGATIVE
Nitrite, UA: NEGATIVE
Specific Gravity, UA: 1.025 (ref 1.005–1.030)
Urobilinogen, Ur: 0.2 mg/dL (ref 0.2–1.0)
pH, UA: 6.5 (ref 5.0–7.5)

## 2020-02-15 LAB — MICROSCOPIC EXAMINATION
RBC, Urine: >30 /hpf — AB (ref 0–2)
WBC, UA: NONE SEEN /hpf (ref 0–5)

## 2020-02-15 MED ORDER — OXYCODONE HCL 10 MG PO TABS: 10.0000 mg | ORAL_TABLET | Freq: Four times a day (QID) | ORAL | 0 refills | Status: DC | PRN

## 2020-02-17 NOTE — Progress Notes (Signed)
[name]  [phone] [phone]  ID: Katrina Page OB: 06-30-1981  MR#: [id number]  [id number]  Patient Care Team: Patient, No Pcp Per as PCP - General (General Practice) Lloyd Huger, MD as Consulting Physician (Oncology)  CHIEF COMPLAINT: Bilateral pulmonary embolism, lung nodule.  INTERVAL HISTORY: Patient returns to clinic today for repeat laboratory work and hospital follow-up.  She recently was admitted with kidney stones requiring stent placement.  She has had significant anemia and thrombocytopenia.  Currently, she has left flank pain but otherwise feels well.  She has no neurologic complaints.  She denies any recent fevers or illnesses.  She has a good appetite and denies weight loss.  She denies any chest pain, shortness of breath, cough, or hemoptysis. She has no nausea, vomiting, constipation, or diarrhea.  She has no urinary complaints.  Patient offers no further specific complaints today.  REVIEW OF SYSTEMS:   Review of Systems  Constitutional: Negative.  Negative for fever, malaise/fatigue and weight loss.  Respiratory: Negative.  Negative for cough, hemoptysis and shortness of breath.   Cardiovascular: Negative.  Negative for chest pain and leg swelling.  Gastrointestinal: Negative.  Negative for abdominal pain.  Genitourinary: Positive for flank pain. Negative for dysuria and hematuria.  Musculoskeletal: Positive for back pain and joint pain. Negative for myalgias.  Skin: Negative.  Negative for rash.  Neurological: Negative.  Negative for dizziness, focal weakness, weakness and headaches.  Psychiatric/Behavioral: Negative.  The patient is not nervous/anxious.     As per HPI. Otherwise, a complete review of systems is negative.  PAST MEDICAL HISTORY: Past Medical History:  Diagnosis Date  . Migraines   . Thrombocytopenia ([place])     PAST SURGICAL HISTORY: Past Surgical History:  Procedure Laterality Date  .  CHOLECYSTECTOMY    . CYSTOSCOPY WITH STENT PLACEMENT Left 02/09/2020   Procedure: CYSTOSCOPY WITH STENT PLACEMENT;  Surgeon: Hollice Espy, MD;  Location: [facility] ORS;  Service: Urology;  Laterality: Left;  . TUBAL LIGATION      FAMILY HISTORY: Family History  Problem Relation Age of Onset  . Hypertension Mother   . Diabetes Mother   . Stroke Mother   . Breast cancer Mother     ADVANCED DIRECTIVES (Y/N):  N  HEALTH MAINTENANCE: Social History   Tobacco Use  . Smoking status: Current Every Day Smoker    Packs/day: 0.50    Years: 20.00    Pack years: 10.00    Types: Cigarettes  . Smokeless tobacco: Never Used  Substance Use Topics  . Alcohol use: Yes    Alcohol/week: 2.0 - 3.0 standard drinks    Types: 2 - 3 Shots of liquor per week    Comment: 2-3 week.    . Drug use: No     Colonoscopy:  PAP:  Bone density:  Lipid panel:  No Known Allergies  Current Outpatient Medications  Medication Sig Dispense Refill  . albuterol (VENTOLIN HFA) 108 (90 Base) MCG/ACT inhaler Inhale 1-2 puffs into the lungs every 4 (four) hours as needed for wheezing or shortness of breath. 6.7 g 1  . folic acid (FOLVITE) 1 MG tablet Take 1 tablet (1 mg total) by mouth daily. 90 tablet 1  . hydrOXYzine (ATARAX/VISTARIL) 50 MG tablet Take 1 tablet (50 mg total) by mouth 3 (three) times daily as needed for anxiety. 60 tablet 1  . ondansetron (ZOFRAN) 4 MG tablet Take 1 tablet (4 mg total) by mouth every 6 (six) hours as needed for  nausea. 20 tablet 0  . opium-belladonna (B&O SUPPRETTES) 16.2-60 MG suppository Place 1 suppository rectally every 12 (twelve) hours as needed for bladder spasms. 10 suppository 0  . oxybutynin (DITROPAN) 5 MG tablet Take 1 tablet (5 mg total) by mouth every 8 (eight) hours as needed for bladder spasms. 30 tablet 0  . Oxycodone HCl 10 MG TABS Take 1 tablet (10 mg total) by mouth every 6 (six) hours as needed (moderate to severe pain). 10 tablet 0  . potassium chloride SA  (KLOR-CON) 20 MEQ tablet Take 1 tablet (20 mEq total) by mouth daily. 30 tablet 0  . traZODone (DESYREL) 100 MG tablet Take 2 tablets (200 mg total) by mouth at bedtime. 60 tablet 1  . venlafaxine XR (EFFEXOR-XR) 75 MG 24 hr capsule Take 1 capsule (75 mg total) by mouth daily with breakfast. 30 capsule 1  . vitamin B-12 1000 MCG tablet Take 1 tablet (1,000 mcg total) by mouth daily. 90 tablet 1   No current facility-administered medications for this visit.    OBJECTIVE: Vitals:   02/24/20 1415  BP: (!) 150/77  Pulse: 78  Resp: 20  Temp: 98.5 F (36.9 C)  SpO2: 100%     Body mass index is 29.89 kg/m.    ECOG FS:0 - Asymptomatic  General: Well-developed, well-nourished, no acute distress. Eyes: Pink conjunctiva, anicteric sclera. HEENT: Normocephalic, moist mucous membranes. Lungs: No audible wheezing or coughing. Heart: Regular rate and rhythm. Abdomen: Soft, nontender, no obvious distention. Musculoskeletal: No edema, cyanosis, or clubbing. Neuro: Alert, answering all questions appropriately. Cranial nerves grossly intact. Skin: No rashes or petechiae noted. Psych: Normal affect.  LAB RESULTS:  Lab Results  Component Value Date   NA 139 02/14/2020   K 3.6 02/14/2020   CL 105 02/14/2020   CO2 27 02/14/2020   GLUCOSE 112 (H) 02/14/2020   BUN 7 02/14/2020   CREATININE 0.87 02/14/2020   CALCIUM 8.8 (L) 02/14/2020   PROT 6.3 (L) 02/14/2020   ALBUMIN 3.5 02/14/2020   AST 16 02/14/2020   ALT 13 02/14/2020   ALKPHOS 42 02/14/2020   BILITOT 0.9 02/14/2020   GFRNONAA >60 02/14/2020   GFRAA >60 02/14/2020    Lab Results  Component Value Date   WBC 11.1 (H) 02/24/2020   NEUTROABS 10.2 (H) 02/24/2020   HGB 10.3 (L) 02/24/2020   HCT 35.6 (L) 02/24/2020   MCV 77.6 (L) 02/24/2020   PLT 528 (H) 02/24/2020     STUDIES: CT ABDOMEN PELVIS WO CONTRAST  Result Date: 02/11/2020 CLINICAL DATA:  Anemia, recent renal stent placement, declining hemoglobin, anticoagulation,  rule out hemorrhage EXAM: CT ABDOMEN AND PELVIS WITHOUT CONTRAST TECHNIQUE: Multidetector CT imaging of the abdomen and pelvis was performed following the standard protocol without IV contrast. COMPARISON:  02/09/2020 FINDINGS: Lower chest: No acute abnormality. Hepatobiliary: No focal liver abnormality is seen. Hepatic steatosis. Status post cholecystectomy. No biliary dilatation. Pancreas: Unremarkable. No pancreatic ductal dilatation or surrounding inflammatory changes. Spleen: Normal in size without significant abnormality. Adrenals/Urinary Tract: Adrenal glands are unremarkable. Interval placement of left-sided double-J ureteral stent with formed pigtails in the renal pelvis and bladder. There remains a large calculus in the left renal pelvis. There are multiple additional tiny nonobstructive calculi bilaterally. No hydronephrosis. Bladder is unremarkable. Stomach/Bowel: Stomach is within normal limits. Appendix appears normal. No evidence of bowel wall thickening, distention, or inflammatory changes. Vascular/Lymphatic: No significant vascular findings are present. No enlarged abdominal or pelvic lymph nodes. Reproductive: No mass or other significant abnormality. Other:  No abdominal wall hernia or abnormality. Trace simple attenuation free fluid in the low pelvis. Musculoskeletal: No acute or significant osseous findings. IMPRESSION: 1. Interval placement of left-sided double-J ureteral stent with formed pigtails in the renal pelvis and bladder. There remains a large calculus in the left renal pelvis. No hydronephrosis. 2. There are multiple additional tiny nonobstructive calculi bilaterally. 3. Hepatic steatosis. 4. Trace simple attenuation free fluid in the low pelvis, either reactive or functional in the reproductive age setting, not suspicious for hemoperitoneum given attenuation. Electronically Signed   By: Eddie Candle M.D.   On: 02/11/2020 12:20   CT ABDOMEN PELVIS W CONTRAST  Result Date:  02/09/2020 CLINICAL DATA:  Nausea and vomiting EXAM: CT ABDOMEN AND PELVIS WITH CONTRAST TECHNIQUE: Multidetector CT imaging of the abdomen and pelvis was performed using the standard protocol following bolus administration of intravenous contrast. CONTRAST:  131mL OMNIPAQUE IOHEXOL 300 MG/ML  SOLN COMPARISON:  11/27/2007, PET CT 08/23/2019, radiograph 12/10/2019 FINDINGS: Lower chest: Lung bases demonstrate hazy dependent atelectasis. Hepatobiliary: Hepatic steatosis. Status post cholecystectomy. No biliary dilatation Pancreas: Unremarkable. No pancreatic ductal dilatation or surrounding inflammatory changes. Spleen: Normal in size without focal abnormality. Adrenals/Urinary Tract: Stable 1.4 cm right adrenal gland nodule suggestive of adenoma. Increased size of left adrenal mass measuring 6 x 3.9 cm, previously 5.6 x 3.8 cm. Multiple intrarenal stones bilaterally. 13 mm left renal collecting stone with obstructive changes. Soft tissue stranding about the left renal pelvis. Mild urothelial enhancement of left ureter. The bladder is unremarkable. Small cyst in the lower pole of the right kidney. Stomach/Bowel: Stomach is nonenlarged. No dilated small bowel. No bowel wall thickening. Negative appendix. Sigmoid colon diverticula without acute inflammatory change Vascular/Lymphatic: No significant vascular findings are present. No enlarged abdominal or pelvic lymph nodes. Reproductive: Uterus and bilateral adnexa are unremarkable. Other: Negative for free air or free fluid. Musculoskeletal: No acute or significant osseous findings. IMPRESSION: 1. Negative for bowel obstruction bowel wall thickening. 2. 13 mm left renal collecting system stone with obstructive changes. There is inflammatory change about the left renal pelvis and mild urothelial enhancement of left ureter. There are multiple additional stones within the bilateral kidneys. 3. Stable 14 mm right adrenal nodule likely adenoma. Suspected interval enlargement  of left adrenal mass, now measuring 6 cm. Given growth, consider surgical consultation. Electronically Signed   By: Donavan Foil M.D.   On: 02/09/2020 03:32   DG Abdomen Acute W/Chest  Result Date: 02/09/2020 CLINICAL DATA:  Persistent vomiting EXAM: DG ABDOMEN ACUTE W/ 1V CHEST COMPARISON:  04/03/2016, PET CT 08/23/2019 FINDINGS: Single-view chest demonstrates focal atelectasis or small infiltrate at the right base. Ovoid opacities in the left mid and lower thorax possibly related to healing rib fractures. Normal heart size. No pneumothorax. Supine and upright views of the abdomen demonstrate no free air beneath the diaphragm. Mildly dilated small bowel in the left mid abdominal region with relative absence of distal gas. 19 mm calcification in the left mid abdomen corresponds to a renal pelvis kidney stone IMPRESSION: 1. Small focus of atelectasis or infiltrate at the right base. Possible multiple healing left rib fractures 2. Dilated loops of small bowel in the left abdomen with relative absence of distal gas; findings could be secondary to bowel obstruction though focal or localized ileus could also produce this appearance. 3. 19 mm left kidney stone Electronically Signed   By: Donavan Foil M.D.   On: 02/09/2020 02:27   DG OR UROLOGY CYSTO IMAGE ([facility] ONLY)  Result  Date: 02/09/2020 There is no interpretation for this exam.  This order is for images obtained during a surgical procedure.  Please See "Surgeries" Tab for more information regarding the procedure.    ASSESSMENT: Bilateral pulmonary embolism, lung nodule.  PLAN:    1.  Bilateral pulmonary embolism: Diagnosed incidentally on July 23, 2019.  Patient did not appear to have any transient risk factors, although obesity, tobacco use, and sedentary lifestyle do increase her risk.  Full hypercoagulable work-up is negative.  Patient has now completed 6 months of Xarelto and this has been discontinued.  2. Right lung nodule: CT scan results  from July 23, 2019 reviewed independently with a 1.8 cm lesion in the right anterior lung that was not present on imaging in March 2020.  PET scan results from August 23, 2019 reviewed independently with complete resolution of nodule and no other suspicious lesions.  Left adrenal nodule is not FDG avid and likely a benign adrenal adenoma.  No further imaging is necessary at this time. 3.  Kidney stones: Patient has a stent in place.  Continue follow-up with urology as indicated. 4.  Pain: Multifactorial.  Patient has been instructed to call urology clinic for stent and kidney stone management. 5.  Anemia: Patient's hemoglobin continues to trend up and is now 11.3. 6.  Thrombocytopenia: Resolved.  Patient now has thrombocytosis which is likely reactive.  Patient expressed understanding and was in agreement with this plan. She also understands that She can call clinic at any time with any questions, concerns, or complaints.    Lloyd Huger, MD   02/26/2020 10:18 AM

## 2020-02-22 ENCOUNTER — Inpatient Hospital Stay
Admission: RE | Admit: 2020-02-22 | Discharge: 2020-02-22 | Disposition: A | Discharge status: Home / Self Care | Payer: Self-pay | Source: Ambulatory Visit

## 2020-02-22 HISTORY — DX: Thrombocytopenia, unspecified: D69.6

## 2020-02-22 NOTE — Pre-Procedure Instructions (Signed)
Cammie Sickle, MD  Physician  Oncology     Consult Note     Signed     Date of Service:  02/10/2020  4:06 PM                  Signed          Expand AllCollapse All            Expand widget buttonCollapse widget button    Show:Clear all   ManualTemplateCopied  Added by:     Cammie Sickle, MD   Hover for detailscustomization button                                                                                                                                                                                                   [place]  CONSULT NOTE     Patient Care Team:  Patient, No Pcp Per as PCP - General (General Practice)     CHIEF COMPLAINTS/PURPOSE OF CONSULTATION:      HISTORY OF PRESENTING ILLNESS:   Katrina Page 39 y.o.  female prior history of kidney stone; also history of renal mass is currently admitted to hospital for poor p.o. intake secondary nausea vomiting.  Also worsening abdominal pain.      Patient had a CT scan that showed enlarging left sided renal mass which 6 cm.  This was previously worked up by urology Dr. Bernardo Heater; PET scan no metabolic activity in December 2020.  Patient did not have work-up for pheochromocytoma as recommended with endocrinology.      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      On admission to hospital-patient noted to have platelet count of 74; and 7.1 MCV 76. ;  Renal function normal.       Patient underwent left retrograde pyelogram/left ureteral stent placement-currently on antibiotics symptomatically improved.        Review of Systems   Constitutional: Positive for malaise/fatigue. Negative for chills, diaphoresis and weight loss.   HENT:  Negative for nosebleeds and sore throat.    Eyes: Negative for double vision.   Respiratory: Negative for cough, hemoptysis, sputum production, shortness of breath and wheezing.    Cardiovascular: Negative for chest pain, palpitations, orthopnea and leg swelling.   Gastrointestinal: Positive for abdominal pain, nausea and vomiting. Negative for blood in stool, constipation, diarrhea, heartburn and melena.   Genitourinary: Negative for dysuria, frequency and urgency.   Musculoskeletal: Positive for back pain and joint pain.   Skin: Negative.  Negative for itching and rash.   Neurological:  Negative for dizziness, tingling, focal weakness, weakness and headaches.   Endo/Heme/Allergies: Does not bruise/bleed easily.   Psychiatric/Behavioral: Negative for depression. The patient is not nervous/anxious and does not have insomnia.          MEDICAL HISTORY:        Past Medical History:    Diagnosis   Date    .   Migraines              SURGICAL HISTORY:        Past Surgical History:    Procedure   Laterality   Date    .   CHOLECYSTECTOMY            .   CYSTOSCOPY WITH STENT PLACEMENT   Left   02/09/2020        Procedure: CYSTOSCOPY WITH STENT PLACEMENT;  Surgeon: Hollice Espy, MD;  Location: [facility] ORS;  Service: Urology;  Laterality: Left;    .   TUBAL LIGATION                  SOCIAL HISTORY:   Social History             Socioeconomic History    .   Marital status:   Divorced            Spouse name:   Not on file    .   Number of children:   Not on file    .   Years of education:   Not on file    .   Highest education level:   Not on file    Occupational History    .   Not on file    Tobacco Use    .   Smoking status:   Current Every Day Smoker            Packs/day:   0.50            Years:   20.00            Pack years:   10.00             Types:   Cigarettes    .   Smokeless tobacco:   Never Used    Substance and Sexual Activity    .   Alcohol use:   Yes            Alcohol/week:   2.0 - 3.0 standard drinks            Types:   2 - 3 Shots of liquor per week            Comment: 2-3 week.      .   Drug use:   No    .   Sexual activity:   Not on file    Other Topics   Concern    .   Not on file    Social History Narrative    .   Not on file        Social Determinants of Health           Financial Resource Strain:     .   Difficulty of Paying Living Expenses:     Food Insecurity:     .   Worried About Charity fundraiser in the Last Year:     .   Arboriculturist in the Last Year:     Transportation Needs:     .  Lack of Transportation (Medical):     Marland Kitchen   Lack of Transportation (Non-Medical):     Physical Activity:     .   Days of Exercise per Week:     .   Minutes of Exercise per Session:     Stress:     .   Feeling of Stress :     Social Connections:     .   Frequency of Communication with Friends and Family:     .   Frequency of Social Gatherings with Friends and Family:     .   Attends Religious Services:     .   Active Member of Clubs or Organizations:     .   Attends Archivist Meetings:     Marland Kitchen   Marital Status:     Intimate Partner Violence:     .   Fear of Current or Ex-Partner:     .   Emotionally Abused:     Marland Kitchen   Physically Abused:     .   Sexually Abused:           FAMILY HISTORY:        Family History    Problem   Relation   Age of Onset    .   Hypertension   Mother        .   Diabetes   Mother        .   Stroke   Mother        .   Breast cancer   Mother              ALLERGIES:  has No Known Allergies.     MEDICATIONS:             Current Facility-Administered  Medications    Medication   Dose   Route   Frequency   Provider   Last Rate   Last Admin    .   0.9 %  sodium chloride infusion       Intravenous   Continuous   Mansy, Arvella Merles, MD   100 mL/hr at 02/10/20 0442   New Bag at 02/10/20 0442    .   acetaminophen (TYLENOL) tablet 650 mg    650 mg   Oral   Q6H PRN   Mansy, Jan A, MD                Or    .   acetaminophen (TYLENOL) suppository 650 mg    650 mg   Rectal   Q6H PRN   Mansy, Jan A, MD            .   albuterol (PROVENTIL) (2.5 MG/3ML) 0.083% nebulizer solution 2.5-5 mg    2.5-5 mg   Inhalation   Q4H PRN   Mansy, Jan A, MD            .   cefTRIAXone (ROCEPHIN) 2 g in sodium chloride 0.9 % 100 mL IVPB    2 g   Intravenous   Q24H   Lorella Nimrod, MD   200 mL/hr at 02/10/20 1640   2 g at 02/10/20 1640    .   folic acid (FOLVITE) tablet 1 mg    1 mg   Oral   Daily   Lorella Nimrod, MD       1 mg at 02/10/20 1348    .   hydrOXYzine (ATARAX/VISTARIL) tablet 50 mg  50 mg   Oral   TID PRN   Mansy, Jan A, MD            .   ketorolac (TORADOL) 30 MG/ML injection 15 mg    15 mg   Intravenous   Q6H PRN   Mansy, Jan A, MD       15 mg at 02/10/20 1639    .   labetalol (NORMODYNE) injection 20 mg    20 mg   Intravenous   Q3H PRN   Mansy, Jan A, MD            .   magnesium hydroxide (MILK OF MAGNESIA) suspension 30 mL    30 mL   Oral   Daily PRN   Mansy, Jan A, MD            .   morphine 2 MG/ML injection 2 mg    2 mg   Intravenous   Q4H PRN   Mansy, Jan A, MD       2 mg at 02/10/20 1639    .   ondansetron (ZOFRAN) tablet 4 mg    4 mg   Oral   Q6H PRN   Mansy, Jan A, MD       4 mg at 02/10/20 1022        Or    .   ondansetron (ZOFRAN) injection 4 mg    4 mg   Intravenous   Q6H PRN   Mansy, Jan A, MD       4 mg at 02/09/20 1333    .    opium-belladonna (B&O) suppository 16.2-60mg     1 suppository   Rectal   Q12H PRN   Vaillancourt, Samantha, PA-C            .   oxybutynin (DITROPAN) tablet 5 mg    5 mg   Oral   Q8H PRN   Vaillancourt, Samantha, PA-C            .   oxyCODONE (Oxy IR/ROXICODONE) immediate release tablet 10 mg    10 mg   Oral   Q6H PRN   Mansy, Jan A, MD       10 mg at 02/10/20 1349    .   rivaroxaban (XARELTO) tablet 20 mg    20 mg   Oral   Q supper   Lorella Nimrod, MD            .   traZODone (DESYREL) tablet 200 mg    200 mg   Oral   QHS   Mansy, Jan A, MD       200 mg at 02/09/20 2145             .     PHYSICAL EXAMINATION:          Vitals:        02/10/20 0001   02/10/20 0745    BP:   (!) 148/89   (!) 143/75    Pulse:       73    Resp:       14    Temp:   98.4 F (36.9 C)   98.1 F (36.7 C)    SpO2:       96%           Filed Weights        02/08/20 1829    Weight:   215 lb (97.5 kg)  Physical Exam   Constitutional: She is oriented to person, place, and time and well-developed, well-nourished, and in no distress.  Tenderness left flank.   HENT:   Head: Normocephalic and atraumatic.   Mouth/Throat: Oropharynx is clear and moist. No oropharyngeal exudate.   Eyes: Pupils are equal, round, and reactive to light.   Cardiovascular: Normal rate and regular rhythm.   Pulmonary/Chest: Effort normal and breath sounds normal. No respiratory distress. She has no wheezes.   Abdominal: Soft. Bowel sounds are normal. She exhibits no distension and no mass. There is no abdominal tenderness. There is no rebound and no guarding.   Musculoskeletal:         General: No tenderness or edema. Normal range of motion.     Cervical back: Normal range of motion and neck supple.  Neurological: She is alert and oriented to person, place, and time.   Skin:  Skin is warm.  Psychiatric: Affect normal.            LABORATORY DATA:   I have reviewed the data as listed  Recent Labs                                                                                              Recent Labs (within last 365 days)  RADIOGRAPHIC STUDIES:  I have personally reviewed the radiological images as listed and agreed with the findings in the report.    Imaging Results                                        Thrombocytopenia [facility])  # 39 year old female patient currently in the hospital for UTI-noted to have worsening thrombocytopenia/also adrenal mass.     #Acute thrombocytopenia-Baseline platelets 240s; on admission 74; today 70.  Patient on IV heparin yesterday.  The etiology is unclear-include infection/medications-less likely heparin.  However recent admission to the hospital month ago/with possible exposure to heparin-recommend HIT antibodies.      #History of PE-October 2020 continue Xarelto for now.      #Anemia chronic hemoglobin 7.6 microcytic-iron deficiency versus anemia of chronic disease.  Await iron studies.     #Left adrenal mass-increasing approximately 6 in size.  Etiology is unclear.  Dec 2020-negative for any lung malignancy.  Given the increasing size/malignant potential-patient will need work-up for pheochromocytoma; defer to urology/endocrinology.     Thank you for allowing me to participate in the care of your pleasant patient. Please do not hesitate to contact me with questions or concerns in the interim.        All questions were answered. The patient knows to call the clinic with any problems, questions or concerns.      Cammie Sickle, MD  02/10/2020 5:23 PM                         Electronically signed by Cammie Sickle, MD at 02/10/2020  5:23 PM             ED to Hosp-Admission (Discharged) on 02/08/2020               Routing History               Detailed Report             Note shared with patient

## 2020-02-24 ENCOUNTER — Other Ambulatory Visit: Payer: Self-pay

## 2020-02-24 ENCOUNTER — Encounter: Payer: Self-pay | Admitting: Oncology

## 2020-02-24 ENCOUNTER — Other Ambulatory Visit: Payer: Self-pay | Admitting: Family Medicine

## 2020-02-24 ENCOUNTER — Inpatient Hospital Stay: Admission: RE | Admit: 2020-02-24 | Payer: Self-pay | Source: Ambulatory Visit

## 2020-02-24 ENCOUNTER — Inpatient Hospital Stay: Payer: Self-pay

## 2020-02-24 ENCOUNTER — Inpatient Hospital Stay: Payer: Self-pay | Attending: Oncology | Admitting: Oncology

## 2020-02-24 VITALS — BP 150/77 | HR 78 | Temp 98.5°F | Resp 20 | Wt 202.4 lb

## 2020-02-24 DIAGNOSIS — Z79899 Other long term (current) drug therapy: Secondary | ICD-10-CM | POA: Insufficient documentation

## 2020-02-24 DIAGNOSIS — I2694 Multiple subsegmental pulmonary emboli without acute cor pulmonale: Secondary | ICD-10-CM

## 2020-02-24 DIAGNOSIS — R911 Solitary pulmonary nodule: Secondary | ICD-10-CM | POA: Insufficient documentation

## 2020-02-24 DIAGNOSIS — Z86711 Personal history of pulmonary embolism: Secondary | ICD-10-CM | POA: Insufficient documentation

## 2020-02-24 DIAGNOSIS — D696 Thrombocytopenia, unspecified: Secondary | ICD-10-CM | POA: Insufficient documentation

## 2020-02-24 DIAGNOSIS — Z87442 Personal history of urinary calculi: Secondary | ICD-10-CM | POA: Insufficient documentation

## 2020-02-24 DIAGNOSIS — D649 Anemia, unspecified: Secondary | ICD-10-CM | POA: Insufficient documentation

## 2020-02-24 DIAGNOSIS — F17210 Nicotine dependence, cigarettes, uncomplicated: Secondary | ICD-10-CM | POA: Insufficient documentation

## 2020-02-24 DIAGNOSIS — N200 Calculus of kidney: Secondary | ICD-10-CM

## 2020-02-24 LAB — CBC WITH DIFFERENTIAL/PLATELET
Abs Immature Granulocytes: 0.03 10*3/uL (ref 0.00–0.07)
Basophils Absolute: 0.1 10*3/uL (ref 0.0–0.1)
Basophils Relative: 1 %
Eosinophils Absolute: 0 10*3/uL (ref 0.0–0.5)
Eosinophils Relative: 0 %
HCT: 35.6 % — ABNORMAL LOW (ref 36.0–46.0)
Hemoglobin: 10.3 g/dL — ABNORMAL LOW (ref 12.0–15.0)
Immature Granulocytes: 0 %
Lymphocytes Relative: 3 %
Lymphs Abs: 0.4 10*3/uL — ABNORMAL LOW (ref 0.7–4.0)
MCH: 22.4 pg — ABNORMAL LOW (ref 26.0–34.0)
MCHC: 28.9 g/dL — ABNORMAL LOW (ref 30.0–36.0)
MCV: 77.6 fL — ABNORMAL LOW (ref 80.0–100.0)
Monocytes Absolute: 0.4 10*3/uL (ref 0.1–1.0)
Monocytes Relative: 4 %
Neutro Abs: 10.2 10*3/uL — ABNORMAL HIGH (ref 1.7–7.7)
Neutrophils Relative %: 92 %
Platelets: 528 10*3/uL — ABNORMAL HIGH (ref 150–400)
RBC: 4.59 MIL/uL (ref 3.87–5.11)
RDW: 23.9 % — ABNORMAL HIGH (ref 11.5–15.5)
WBC: 11.1 10*3/uL — ABNORMAL HIGH (ref 4.0–10.5)
nRBC: 0 % (ref 0.0–0.2)

## 2020-02-24 NOTE — Progress Notes (Signed)
Patient reports pain in back and rate pain at 11. She also states she had oxycodone for pain but currently has no pain medication for back. She reports no sleep due to pain in back. She reports some urinary frequency and a small amount of pain. She states she has a urinary stent and is not sure if pain could be coming from that. She has never had one before.

## 2020-02-27 ENCOUNTER — Other Ambulatory Visit: Payer: Self-pay

## 2020-02-27 ENCOUNTER — Telehealth: Payer: Self-pay | Admitting: Urology

## 2020-02-27 DIAGNOSIS — N200 Calculus of kidney: Secondary | ICD-10-CM

## 2020-02-27 LAB — URINALYSIS, COMPLETE
Bilirubin, UA: NEGATIVE
Glucose, UA: NEGATIVE
Ketones, UA: NEGATIVE
Nitrite, UA: NEGATIVE
Specific Gravity, UA: 1.02 (ref 1.005–1.030)
Urobilinogen, Ur: 0.2 mg/dL (ref 0.2–1.0)
pH, UA: 6 (ref 5.0–7.5)

## 2020-02-27 LAB — MICROSCOPIC EXAMINATION: WBC, UA: >30 /hpf — AB (ref 0–5)

## 2020-02-27 MED ORDER — TAMSULOSIN HCL 0.4 MG PO CAPS
0.4000 mg | ORAL_CAPSULE | Freq: Every day | ORAL | 0 refills | Status: DC
Start: 2020-02-27 — End: 2020-03-24

## 2020-02-27 MED ORDER — OXYCODONE HCL 10 MG PO TABS: 10.0000 mg | ORAL_TABLET | Freq: Four times a day (QID) | ORAL | 0 refills | Status: DC | PRN

## 2020-02-27 NOTE — Telephone Encounter (Signed)
Please ensure she has scripts for flomax/ oxybutynin.  Refilled oxycodone #10 without refills as well.  Stent has to stay until surgery.  Hollice Espy, MD

## 2020-02-27 NOTE — Telephone Encounter (Signed)
Patient has taken oxybutynin for the pain, she has been taking Toradol from a previous prescription. Pain has not worsened, states about the same, but unbearable at times.

## 2020-02-27 NOTE — Telephone Encounter (Signed)
Patient aware-sent in Flomax. Voiced understanding.

## 2020-02-27 NOTE — Telephone Encounter (Signed)
Pt called office and said she spoke with Dr Grayland Ormond on Friday and he was supposed to get in touch with Dr Erlene Quan about either getting her surgery moved up or getting her some pain meds to last until surgery.  I didn't see any notes in pt's chart, but she states she's in a lot of pain.

## 2020-02-28 ENCOUNTER — Encounter
Admission: RE | Admit: 2020-02-28 | Discharge: 2020-02-28 | Disposition: A | Discharge status: Home / Self Care | Payer: Self-pay | Source: Ambulatory Visit | Attending: Urology | Admitting: Urology

## 2020-02-28 HISTORY — DX: Depression, unspecified: F32.A

## 2020-02-28 HISTORY — DX: Personal history of urinary calculi: Z87.442

## 2020-02-28 HISTORY — DX: Bronchitis, not specified as acute or chronic: J40

## 2020-02-28 HISTORY — DX: Anemia, unspecified: D64.9

## 2020-02-28 NOTE — Patient Instructions (Addendum)
COVID TESTING Date: Thursday, June 10 Testing site:  [facility] [address] Thru Hours:  1:58 am - 1:00 pm Once you are tested, you are asked to stay quarantined (avoiding public places) until after your surgery.   Your procedure is scheduled on: Monday, June 14 Report to Day Surgery on the 2nd floor of the [facility]. To find out your arrival time, please call [phone] between 1PM - 3PM on: Friday, June 11  REMEMBER: Instructions that are not followed completely may result in serious medical risk, up to and including death; or upon the discretion of your surgeon and anesthesiologist your surgery may need to be rescheduled.  Do not eat food after midnight the night before surgery.  No gum chewing, lozengers or hard candies.  You may however, drink CLEAR liquids up to 2 hours before you are scheduled to arrive for your surgery. Do not drink anything within 2 hours of your scheduled arrival time.  Clear liquids include: - water  - apple juice without pulp - gatorade (not RED) - black coffee or tea (Do NOT add milk or creamers to the coffee or tea) Do NOT drink anything that is not on this list.  TAKE THESE MEDICATIONS THE MORNING OF SURGERY WITH A SIP OF WATER:  1.  Albuterol inhaler 2.  Tamsulosin (Flomax) 3.  Venlafaxine (Effexor) 4.  Oxycodone if needed for pain 5.  Ondansetron if needed for nausea  Use inhalers on the day of surgery and bring to the hospital.  Stop Anti-inflammatories (NSAIDS) such as Advil, Aleve, Ibuprofen, Motrin, Naproxen, Naprosyn and Aspirin based products such as Excedrin, Goodys Powder, BC Powder. (May take Tylenol or Acetaminophen if needed.)  Stop ANY OVER THE COUNTER supplements until after surgery. (May continue Vitamin B.)  No Alcohol for 24 hours before or after surgery.  No Smoking including e-cigarettes for 24 hours prior to surgery.  No chewable tobacco products for at least 6 hours prior to  surgery.  No nicotine patches on the day of surgery.  On the morning of surgery brush your teeth with toothpaste and water, you may rinse your mouth with mouthwash if you wish. Do not swallow any toothpaste or mouthwash.  Do not wear jewelry, make-up, hairpins, clips or nail polish.  Do not wear lotions, powders, or perfumes.   Do not bring valuables to the hospital. [facility] is not responsible for any missing/lost belongings or valuables.   Notify your doctor if there is any change in your medical condition (cold, fever, infection).  Wear comfortable clothing (specific to your surgery type) to the hospital.  Plan for stool softeners for home use; pain medications have a tendency to cause constipation. You can also help prevent constipation by eating foods high in fiber such as fruits and vegetables and drinking plenty of fluids as your diet allows.  After surgery, you can help prevent lung complications by doing breathing exercises.  Take deep breaths and cough every 1-2 hours. Your doctor may order a device called an Incentive Spirometer to help you take deep breaths.  If you are being discharged the day of surgery, you will not be allowed to drive home. You will need a responsible adult (18 years or older) to drive you home and stay with you that night.   If you are taking public transportation, you will need to have a responsible adult (18 years or older) with you. Please confirm with your physician that it is acceptable to use  public transportation.   Please call the [facility] at [phone] if you have any questions about these instructions.  Visitation Policy:  Patients undergoing a surgery or procedure may have one family member or support person with them as long as that person is not COVID-19 positive or experiencing its symptoms.  That person may remain in the waiting area during the procedure.  As a reminder, masks are still required for all  [facility] team members, patients and visitors in all [facility] facilities.   Systemwide, no visitors 17 or younger.

## 2020-03-01 ENCOUNTER — Other Ambulatory Visit: Payer: Self-pay

## 2020-03-01 ENCOUNTER — Other Ambulatory Visit
Admission: RE | Admit: 2020-03-01 | Discharge: 2020-03-01 | Disposition: A | Discharge status: Home / Self Care | Payer: Self-pay | Source: Ambulatory Visit | Attending: Urology | Admitting: Urology

## 2020-03-01 ENCOUNTER — Encounter: Payer: Self-pay | Admitting: *Deleted

## 2020-03-01 ENCOUNTER — Telehealth: Payer: Self-pay | Admitting: Urology

## 2020-03-01 DIAGNOSIS — Z20822 Contact with and (suspected) exposure to covid-19: Secondary | ICD-10-CM | POA: Insufficient documentation

## 2020-03-01 DIAGNOSIS — Z01812 Encounter for preprocedural laboratory examination: Secondary | ICD-10-CM | POA: Insufficient documentation

## 2020-03-01 LAB — CBC
HCT: 34.6 % — ABNORMAL LOW (ref 36.0–46.0)
Hemoglobin: 9.9 g/dL — ABNORMAL LOW (ref 12.0–15.0)
MCH: 22.3 pg — ABNORMAL LOW (ref 26.0–34.0)
MCHC: 28.6 g/dL — ABNORMAL LOW (ref 30.0–36.0)
MCV: 78.1 fL — ABNORMAL LOW (ref 80.0–100.0)
Platelets: 313 10*3/uL (ref 150–400)
RBC: 4.43 MIL/uL (ref 3.87–5.11)
RDW: 22.9 % — ABNORMAL HIGH (ref 11.5–15.5)
WBC: 8.2 10*3/uL (ref 4.0–10.5)
nRBC: 0 % (ref 0.0–0.2)

## 2020-03-01 LAB — POTASSIUM: Potassium: 3.6 mmol/L (ref 3.5–5.1)

## 2020-03-01 MED ORDER — OXYCODONE HCL 10 MG PO TABS: 10.0000 mg | ORAL_TABLET | Freq: Four times a day (QID) | ORAL | 0 refills | Status: DC | PRN

## 2020-03-01 NOTE — Telephone Encounter (Signed)
She can have #10 more tabs to hold her until Monday.  I'd prefer to try to keep her out of the ED if possible.    Script sent.  Hollice Espy, MD

## 2020-03-01 NOTE — Pre-Procedure Instructions (Addendum)
Left message on voice mail that patient needed to arrive for labs and covid testing.Called home number and made contact with her mother.  Mother said that patient was at home, sick and in pain.  I explained that if she did not arrive today for her covid, then she would not be able to have her upcoming procedure.  Mother said that she would call patient and tell her to come to [facility] covid testing.

## 2020-03-01 NOTE — Telephone Encounter (Signed)
Spoke with patient, has been taking Flomax/Oxybuytin. Aware per note on 02/27/20 she was to take Oxycodone as needed every 6-8 for severe pain with no refills till surgery. Advised if in severe pain to go to ER, patient declined-stated she did not want to wait.  Phone line was disconnected in the middle of our conversation, called back and left VM to ensure patient verbalized conversation.

## 2020-03-01 NOTE — Telephone Encounter (Signed)
Pt was over for COVID test and came in to office wanting Oxycodone for 10 tablets.  Her surgery is 6/14th and she would like some more Oxycodone sent to [facility] in [place].  [phone] or [phone]

## 2020-03-01 NOTE — Telephone Encounter (Signed)
Left patient a VM and sent patient a MyChart message.

## 2020-03-02 ENCOUNTER — Telehealth: Payer: Self-pay

## 2020-03-02 DIAGNOSIS — N760 Acute vaginitis: Secondary | ICD-10-CM

## 2020-03-02 LAB — SARS CORONAVIRUS 2 (TAT 6-24 HRS): SARS Coronavirus 2: NEGATIVE

## 2020-03-02 LAB — CULTURE, URINE COMPREHENSIVE

## 2020-03-02 MED ORDER — SULFAMETHOXAZOLE-TRIMETHOPRIM 800-160 MG PO TABS: 1.0000 | ORAL_TABLET | Freq: Two times a day (BID) | ORAL | 0 refills | Status: AC

## 2020-03-02 NOTE — Telephone Encounter (Signed)
Called pt informed her of the information below. Pt gave verbal understanding. RX sent.  

## 2020-03-02 NOTE — Telephone Encounter (Signed)
-----   Message from Hollice Espy, MD sent at 03/02/2020 12:01 PM EDT ----- Please start bactrim DS bid x  5 days starting today as preop precaution  Hollice Espy, MD

## 2020-03-05 ENCOUNTER — Other Ambulatory Visit: Payer: Self-pay

## 2020-03-05 ENCOUNTER — Ambulatory Visit: Payer: Self-pay

## 2020-03-05 ENCOUNTER — Encounter
Admission: RE | Disposition: A | Discharge status: Home / Self Care | Payer: Self-pay | Source: Home / Self Care | Attending: Urology

## 2020-03-05 ENCOUNTER — Ambulatory Visit: Payer: Self-pay | Admitting: Anesthesiology

## 2020-03-05 ENCOUNTER — Encounter: Payer: Self-pay | Admitting: Urology

## 2020-03-05 ENCOUNTER — Ambulatory Visit
Admission: RE | Admit: 2020-03-05 | Discharge: 2020-03-05 | Disposition: A | Discharge status: Home / Self Care | Payer: Self-pay | Attending: Urology | Admitting: Urology

## 2020-03-05 DIAGNOSIS — Z79899 Other long term (current) drug therapy: Secondary | ICD-10-CM | POA: Insufficient documentation

## 2020-03-05 DIAGNOSIS — Z6829 Body mass index (BMI) 29.0-29.9, adult: Secondary | ICD-10-CM | POA: Insufficient documentation

## 2020-03-05 DIAGNOSIS — Z86711 Personal history of pulmonary embolism: Secondary | ICD-10-CM | POA: Insufficient documentation

## 2020-03-05 DIAGNOSIS — F17210 Nicotine dependence, cigarettes, uncomplicated: Secondary | ICD-10-CM | POA: Insufficient documentation

## 2020-03-05 DIAGNOSIS — E669 Obesity, unspecified: Secondary | ICD-10-CM | POA: Insufficient documentation

## 2020-03-05 DIAGNOSIS — N200 Calculus of kidney: Secondary | ICD-10-CM | POA: Insufficient documentation

## 2020-03-05 DIAGNOSIS — F329 Major depressive disorder, single episode, unspecified: Secondary | ICD-10-CM | POA: Insufficient documentation

## 2020-03-05 DIAGNOSIS — E278 Other specified disorders of adrenal gland: Secondary | ICD-10-CM | POA: Insufficient documentation

## 2020-03-05 HISTORY — PX: CYSTOSCOPY W/ RETROGRADES: SHX1426

## 2020-03-05 HISTORY — PX: CYSTOSCOPY/URETEROSCOPY/HOLMIUM LASER/STENT PLACEMENT: SHX6546

## 2020-03-05 LAB — POCT PREGNANCY, URINE: Preg Test, Ur: NEGATIVE

## 2020-03-05 SURGERY — CYSTOSCOPY/URETEROSCOPY/HOLMIUM LASER/STENT PLACEMENT
Anesthesia: General | Site: Ureter | Laterality: Left

## 2020-03-05 MED ORDER — PROMETHAZINE HCL 25 MG/ML IJ SOLN
INTRAMUSCULAR | Status: AC
  Administered 2020-03-05: 6.25 mg via INTRAVENOUS
  Filled 2020-03-05: qty 1

## 2020-03-05 MED ORDER — FAMOTIDINE 20 MG PO TABS
ORAL_TABLET | ORAL | Status: AC
  Administered 2020-03-05: 20 mg via ORAL
  Filled 2020-03-05: qty 1

## 2020-03-05 MED ORDER — SUCCINYLCHOLINE CHLORIDE 20 MG/ML IJ SOLN
INTRAMUSCULAR | Status: DC | PRN
  Administered 2020-03-05: 120 mg via INTRAVENOUS

## 2020-03-05 MED ORDER — FENTANYL CITRATE (PF) 100 MCG/2ML IJ SOLN
INTRAMUSCULAR | Status: DC | PRN
  Administered 2020-03-05 (×3): 50 ug via INTRAVENOUS
  Administered 2020-03-05: 100 ug via INTRAVENOUS
  Administered 2020-03-05: 50 ug via INTRAVENOUS

## 2020-03-05 MED ORDER — IOHEXOL 180 MG/ML  SOLN
INTRAMUSCULAR | Status: DC | PRN
  Administered 2020-03-05: 20 mL

## 2020-03-05 MED ORDER — CHLORHEXIDINE GLUCONATE 0.12 % MT SOLN
OROMUCOSAL | Status: AC
  Filled 2020-03-05: qty 15

## 2020-03-05 MED ORDER — ORAL CARE MOUTH RINSE: 15.0000 mL | Freq: Once | OROMUCOSAL | Status: DC

## 2020-03-05 MED ORDER — KETOROLAC TROMETHAMINE 30 MG/ML IJ SOLN
INTRAMUSCULAR | Status: DC | PRN
  Administered 2020-03-05: 30 mg via INTRAVENOUS

## 2020-03-05 MED ORDER — LIDOCAINE HCL (CARDIAC) PF 100 MG/5ML IV SOSY
PREFILLED_SYRINGE | INTRAVENOUS | Status: DC | PRN
  Administered 2020-03-05: 100 mg via INTRAVENOUS

## 2020-03-05 MED ORDER — OXYCODONE HCL 5 MG/5ML PO SOLN: 5.0000 mg | Freq: Once | ORAL | Status: DC | PRN

## 2020-03-05 MED ORDER — DEXAMETHASONE SODIUM PHOSPHATE 10 MG/ML IJ SOLN
INTRAMUSCULAR | Status: DC | PRN
  Administered 2020-03-05: 10 mg via INTRAVENOUS

## 2020-03-05 MED ORDER — HYDROMORPHONE HCL 1 MG/ML IJ SOLN
INTRAMUSCULAR | Status: AC
  Administered 2020-03-05: 0.5 mg via INTRAVENOUS
  Filled 2020-03-05: qty 1

## 2020-03-05 MED ORDER — OXYCODONE HCL 5 MG PO TABS: 5.0000 mg | ORAL_TABLET | Freq: Once | ORAL | Status: DC | PRN

## 2020-03-05 MED ORDER — FENTANYL CITRATE (PF) 100 MCG/2ML IJ SOLN
INTRAMUSCULAR | Status: AC
  Filled 2020-03-05: qty 2

## 2020-03-05 MED ORDER — FENTANYL CITRATE (PF) 100 MCG/2ML IJ SOLN: 25.0000 ug | INTRAMUSCULAR | Status: DC | PRN

## 2020-03-05 MED ORDER — HYDROMORPHONE HCL 1 MG/ML IJ SOLN
0.5000 mg | INTRAMUSCULAR | Status: AC | PRN
  Administered 2020-03-05 (×2): 0.5 mg via INTRAVENOUS

## 2020-03-05 MED ORDER — LACTATED RINGERS IV SOLN: INTRAVENOUS | Status: DC

## 2020-03-05 MED ORDER — ROCURONIUM BROMIDE 100 MG/10ML IV SOLN
INTRAVENOUS | Status: DC | PRN
  Administered 2020-03-05: 30 mg via INTRAVENOUS
  Administered 2020-03-05: 40 mg via INTRAVENOUS
  Administered 2020-03-05: 10 mg via INTRAVENOUS

## 2020-03-05 MED ORDER — CEFAZOLIN SODIUM-DEXTROSE 2-4 GM/100ML-% IV SOLN
INTRAVENOUS | Status: AC
  Filled 2020-03-05: qty 100

## 2020-03-05 MED ORDER — PROMETHAZINE HCL 25 MG/ML IJ SOLN: 6.2500 mg | INTRAMUSCULAR | Status: DC | PRN

## 2020-03-05 MED ORDER — ACETAMINOPHEN 10 MG/ML IV SOLN
INTRAVENOUS | Status: AC
  Filled 2020-03-05: qty 100

## 2020-03-05 MED ORDER — MIDAZOLAM HCL 2 MG/2ML IJ SOLN
INTRAMUSCULAR | Status: AC
  Filled 2020-03-05: qty 2

## 2020-03-05 MED ORDER — OXYCODONE HCL 10 MG PO TABS: 10.0000 mg | ORAL_TABLET | Freq: Four times a day (QID) | ORAL | 0 refills | Status: DC | PRN

## 2020-03-05 MED ORDER — ACETAMINOPHEN 10 MG/ML IV SOLN
INTRAVENOUS | Status: DC | PRN
  Administered 2020-03-05: 1000 mg via INTRAVENOUS

## 2020-03-05 MED ORDER — MIDAZOLAM HCL 2 MG/2ML IJ SOLN
INTRAMUSCULAR | Status: DC | PRN
  Administered 2020-03-05: 2 mg via INTRAVENOUS

## 2020-03-05 MED ORDER — ONDANSETRON HCL 4 MG PO TABS: 4.0000 mg | ORAL_TABLET | Freq: Once | ORAL | Status: DC

## 2020-03-05 MED ORDER — PROPOFOL 10 MG/ML IV BOLUS
INTRAVENOUS | Status: DC | PRN
  Administered 2020-03-05: 180 mg via INTRAVENOUS

## 2020-03-05 MED ORDER — CHLORHEXIDINE GLUCONATE 0.12 % MT SOLN: 15.0000 mL | Freq: Once | OROMUCOSAL | Status: DC

## 2020-03-05 MED ORDER — CEFAZOLIN SODIUM-DEXTROSE 2-4 GM/100ML-% IV SOLN
2.0000 g | INTRAVENOUS | Status: AC
  Administered 2020-03-05: 2 g via INTRAVENOUS

## 2020-03-05 MED ORDER — PHENYLEPHRINE HCL (PRESSORS) 10 MG/ML IV SOLN
INTRAVENOUS | Status: DC | PRN
  Administered 2020-03-05 (×2): 100 ug via INTRAVENOUS

## 2020-03-05 MED ORDER — FAMOTIDINE 20 MG PO TABS: 20.0000 mg | ORAL_TABLET | Freq: Once | ORAL | Status: AC

## 2020-03-05 MED ORDER — SUGAMMADEX SODIUM 200 MG/2ML IV SOLN
INTRAVENOUS | Status: DC | PRN
  Administered 2020-03-05: 182 mg via INTRAVENOUS

## 2020-03-05 MED ORDER — ONDANSETRON 4 MG PO TBDP
ORAL_TABLET | ORAL | Status: AC
  Administered 2020-03-05: 4 mg
  Filled 2020-03-05: qty 1

## 2020-03-05 MED ORDER — ONDANSETRON HCL 4 MG/2ML IJ SOLN
INTRAMUSCULAR | Status: DC | PRN
  Administered 2020-03-05: 4 mg via INTRAVENOUS

## 2020-03-05 MED ORDER — SODIUM CHLORIDE FLUSH 0.9 % IV SOLN
INTRAVENOUS | Status: AC
  Filled 2020-03-05: qty 10

## 2020-03-05 SURGICAL SUPPLY — 28 items
BAG DRAIN CYSTO-URO LG1000N (MISCELLANEOUS) ×3 IMPLANT
BASKET ZERO TIP 1.9FR (BASKET) ×3 IMPLANT
BRUSH SCRUB EZ 1% IODOPHOR (MISCELLANEOUS) ×3 IMPLANT
CATH URETL 5X70 OPEN END (CATHETERS) ×3 IMPLANT
CNTNR SPEC 2.5X3XGRAD LEK (MISCELLANEOUS) ×1
CONT SPEC 4OZ STER OR WHT (MISCELLANEOUS) ×2
CONTAINER SPEC 2.5X3XGRAD LEK (MISCELLANEOUS) ×1 IMPLANT
DRAPE UTILITY 15X26 TOWEL STRL (DRAPES) ×3 IMPLANT
FIBER LASER TRACTIP 200 (UROLOGICAL SUPPLIES) ×3 IMPLANT
GLOVE BIO SURGEON STRL SZ 6.5 (GLOVE) ×2 IMPLANT
GLOVE BIO SURGEONS STRL SZ 6.5 (GLOVE) ×1
GOWN STRL REUS W/ TWL LRG LVL3 (GOWN DISPOSABLE) ×2 IMPLANT
GOWN STRL REUS W/TWL LRG LVL3 (GOWN DISPOSABLE) ×4
GUIDEWIRE GREEN .038 145CM (MISCELLANEOUS) IMPLANT
GUIDEWIRE STR DUAL SENSOR (WIRE) ×3 IMPLANT
INFUSOR MANOMETER BAG 3000ML (MISCELLANEOUS) ×3 IMPLANT
INTRODUCER DILATOR DOUBLE (INTRODUCER) ×3 IMPLANT
KIT TURNOVER CYSTO (KITS) ×3 IMPLANT
PACK CYSTO AR (MISCELLANEOUS) ×3 IMPLANT
SET CYSTO W/LG BORE CLAMP LF (SET/KITS/TRAYS/PACK) ×3 IMPLANT
SHEATH URETERAL 12FRX35CM (MISCELLANEOUS) ×3 IMPLANT
SOL .9 NS 3000ML IRR  AL (IV SOLUTION) ×2
SOL .9 NS 3000ML IRR UROMATIC (IV SOLUTION) ×1 IMPLANT
STENT URET 6FRX24 CONTOUR (STENTS) ×3 IMPLANT
STENT URET 6FRX26 CONTOUR (STENTS) IMPLANT
SURGILUBE 2OZ TUBE FLIPTOP (MISCELLANEOUS) ×3 IMPLANT
WATER STERILE IRR 1000ML POUR (IV SOLUTION) ×3 IMPLANT
WIRE AMPLATZ SSTIFF .035X260CM (WIRE) ×3 IMPLANT

## 2020-03-05 NOTE — Anesthesia Preprocedure Evaluation (Addendum)
Anesthesia Evaluation  Patient identified by MRN, date of birth, ID band Patient awake    Reviewed: Allergy & Precautions, H&P , NPO status , Patient's Chart, lab work & pertinent test results  Airway Mallampati: II  TM Distance: >3 FB     Dental  (+) Missing   Pulmonary neg COPD, Current Smoker and Patient abstained from smoking.,    breath sounds clear to auscultation       Cardiovascular (-) angina(-) Past MI and (-) Cardiac Stents (-) dysrhythmias  Rhythm:regular Rate:Normal  H/o PE   Neuro/Psych  Headaches, PSYCHIATRIC DISORDERS Depression    GI/Hepatic negative GI ROS, (+)     substance abuse  alcohol use,   Endo/Other  negative endocrine ROS  Renal/GU Renal stone     Musculoskeletal   Abdominal   Peds  Hematology  (+) Blood dyscrasia, anemia ,   Anesthesia Other Findings Obese  Past Medical History: 06/2019: Adrenal nodule ([place])     Comment:  left No date: Anemia No date: Bronchitis No date: Depression No date: History of kidney stones No date: Migraines 06/2019: Nodule of right lung 07/23/2019: Pulmonary emboli ([facility])     Comment:  bilateral No date: Thrombocytopenia ([place])  Past Surgical History: No date: APPENDECTOMY No date: CHOLECYSTECTOMY 02/09/2020: CYSTOSCOPY WITH STENT PLACEMENT; Left     Comment:  Procedure: CYSTOSCOPY WITH STENT PLACEMENT;  Surgeon:               Hollice Espy, MD;  Location: [facility] ORS;  Service:               Urology;  Laterality: Left; No date: TUBAL LIGATION  BMI    Body Mass Index: 29.63 kg/m      Reproductive/Obstetrics negative OB ROS                            Anesthesia Physical Anesthesia Plan  ASA: III  Anesthesia Plan: General ETT and Modified Rapid Sequence   Post-op Pain Management:    Induction:   PONV Risk Score and Plan: Ondansetron, Dexamethasone, Midazolam and Treatment may vary due to age or medical  condition  Airway Management Planned:   Additional Equipment:   Intra-op Plan:   Post-operative Plan:   Informed Consent: I have reviewed the patients History and Physical, chart, labs and discussed the procedure including the risks, benefits and alternatives for the proposed anesthesia with the patient or authorized representative who has indicated his/her understanding and acceptance.     Dental Advisory Given  Plan Discussed with: Anesthesiologist, CRNA and Surgeon  Anesthesia Plan Comments:        Anesthesia Quick Evaluation

## 2020-03-05 NOTE — Progress Notes (Signed)
Pt voided and ambulatory prior to discharge

## 2020-03-05 NOTE — Discharge Instructions (Signed)
You have a ureteral stent in place.  This is a tube that extends from your kidney to your bladder.  This may cause urinary bleeding, burning with urination, and urinary frequency.  Please call our office or present to the ED if you develop fevers >101 or pain which is not able to be controlled with oral pain medications.  You may be given either Flomax and/ or ditropan to help with bladder spasms and stent pain in addition to pain medications.    Your stent is on a string.  It is taped to your left inner thigh.  On Thursday morning, you may untaped and pull the stent string until the entire stent is removed.  If you have any issues or difficulties, please call our office for assistance.  [facility] [address], [place], [place] [address] [phone]   AMBULATORY SURGERY  DISCHARGE INSTRUCTIONS   1) The drugs that you were given will stay in your system until tomorrow so for the next 24 hours you should not:  A) Drive an automobile B) Make any legal decisions C) Drink any alcoholic beverage   2) You may resume regular meals tomorrow.  Today it is better to start with liquids and gradually work up to solid foods.  You may eat anything you prefer, but it is better to start with liquids, then soup and crackers, and gradually work up to solid foods.   3) Please notify your doctor immediately if you have any unusual bleeding, trouble breathing, redness and pain at the surgery site, drainage, fever, or pain not relieved by medication.    4) Additional Instructions:        Please contact your physician with any problems or Same Day Surgery at [phone], Monday through Friday 6 am to 4 pm, or [place] at [facility] number at [phone].

## 2020-03-05 NOTE — Transfer of Care (Signed)
Immediate Anesthesia Transfer of Care Note  Patient: Katrina Page  Procedure(s) Performed: CYSTOSCOPY/URETEROSCOPY/HOLMIUM LASER/STENT EXCHANGE (Left Ureter) CYSTOSCOPY WITH RETROGRADE PYELOGRAM (Left Ureter)  Patient Location: PACU  Anesthesia Type:General  Level of Consciousness: awake, alert  and oriented  Airway & Oxygen Therapy: Patient Spontanous Breathing and Patient connected to face mask oxygen  Post-op Assessment: Report given to RN and Post -op Vital signs reviewed and stable  Post vital signs: Reviewed and stable  Last Vitals:  Vitals Value Taken Time  BP 144/93 03/05/20 1114  Temp 36.1 C 03/05/20 1113  Pulse 90 03/05/20 1119  Resp 15 03/05/20 1119  SpO2 100 % 03/05/20 1119  Vitals shown include unvalidated device data.  Last Pain:  Vitals:   03/05/20 1113  TempSrc:   PainSc: 10-Worst pain ever         Complications: No complications documented.

## 2020-03-05 NOTE — Anesthesia Procedure Notes (Signed)
Procedure Name: Intubation Date/Time: 03/05/2020 9:22 AM Performed by: Nelda Marseille, CRNA Pre-anesthesia Checklist: Patient identified, Patient being monitored, Timeout performed, Emergency Drugs available and Suction available Patient Re-evaluated:Patient Re-evaluated prior to induction Oxygen Delivery Method: Circle system utilized Preoxygenation: Pre-oxygenation with 100% oxygen Induction Type: IV induction Ventilation: Mask ventilation without difficulty Laryngoscope Size: Mac, 3 and McGraph Grade View: Grade I Tube type: Oral Tube size: 7.0 mm Number of attempts: 1 Airway Equipment and Method: Stylet Placement Confirmation: ETT inserted through vocal cords under direct vision,  positive ETCO2 and breath sounds checked- equal and bilateral Secured at: 21 cm Tube secured with: Tape Dental Injury: Teeth and Oropharynx as per pre-operative assessment

## 2020-03-05 NOTE — Interval H&P Note (Signed)
History and Physical Interval Note:  03/05/2020 8:42 AM  Katrina Page  has presented today for surgery, with the diagnosis of left renal pelvic stone.  The various methods of treatment have been discussed with the patient and family. After consideration of risks, benefits and other options for treatment, the patient has consented to  Procedure(s): CYSTOSCOPY/URETEROSCOPY/HOLMIUM LASER/STENT EXCHANGE (Left) as a surgical intervention.  The patient's history has been reviewed, patient examined, no change in status, stable for surgery.  I have reviewed the patient's chart and labs.  Questions were answered to the patient's satisfaction.    RRR CTAB  Hollice Espy

## 2020-03-05 NOTE — Op Note (Signed)
Date of procedure: 03/05/20  Preoperative diagnosis:  Left renal pelvic stone  Postoperative diagnosis:  Same as above  Procedure: Left ureteroscopy Laser lithotripsy Left ureteral stent exchange Left retrograde pyelogram Basket extraction of stone fragment  Surgeon: Hollice Espy, MD  Anesthesia: General  Complications: None  Intraoperative findings: Large 1.4 cm left renal pelvic stone fragmented and majority of stone fragments removed via basket.  Uncomplicated procedure.  Stent exchanged on tether.  EBL: Minimal  Specimens: Stone fragment  Drains: 6 x 24 French double-J ureteral stent on left with tether  Indication: Katrina Page is a 39 y.o. patient with .  After reviewing the management options for treatment, he elected to proceed with the above surgical procedure(s). We have discussed the potential benefits and risks of the procedure, side effects of the proposed treatment, the likelihood of the patient achieving the goals of the procedure, and any potential problems that might occur during the procedure or recuperation. Informed consent has been obtained.  Description of procedure:  The patient was taken to the operating room and general anesthesia was induced.  The patient was placed in the dorsal lithotomy position, prepped and draped in the usual sterile fashion, and preoperative antibiotics were administered. A preoperative time-out was performed.   A 21 French scope was advanced per urethra into the bladder.  Attention was turned to the left ureteral orifice from which a ureteral stent was seen emanating.  On scout imaging, the 1.4 cm stone could easily be seen within the renal pelvis.  Stent graspers were then used to grasp the stent and bring it to level the urethral meatus.  The stent was then cannulated using a sensor wire up to the level of the kidney.  The stent was removed leaving the wire in place.  A dual-lumen sheath was used to introduce a second Super  Stiff wire as a working wire.  The safety wire snapped in place.  A Cook 12/14 French ureteral access sheath was advanced to level the proximal ureter without difficulty under fluoroscopic guidance.  The inner cannula and wire were then removed.  A dual-lumen digital flexible ureteroscope was then introduced into the renal pelvis where the stone was encountered.  I initially used dusting settings of 0.2 J and 40 Hz followed by 1.2 J and 15 Hz.  The stone was fragmented into numerous smaller pieces, primarily dust.  The larger pieces were then extracted using 1.9 [place] double spinal basket.  When the renal unit was visually cleared of anything larger than the tip of the laser fiber, final retrograde pyelogram was performed which created a roadmap at the kidney did show the Isharani calyx was directly visualized.  There is no significant residual stone fragment and no contrast extravasation.  The scope was then backed down the length the ureter inspecting along the way.  There were no ureteral injuries.  A few small fragments were identified within the distal ureter which were also extracted using the basket.  The ureter was adequately cleared of all stone burden.  Finally, a 6 x 24 French double-J ureteral stent was advanced over the wire up to the level of the kidney under fluoroscopy.  The wires partially drawn till full coils noted both within the renal pelvis as well as within the bladder.  The bladder was then drained.  The stent string was left attached to the distal coil the stent which was affixed to the patient's left inner thigh using muscle and Tegaderm.  She was then  reversed from anesthesia after being cleaned and dried and taken to the PACU in stable condition.  Plan: She will remove her own stent on Thursday.  She will follow-up in 1 month with a renal ultrasound prior.  She will need to have a work-up at that point in time for her enlarging adrenal mass.  Hollice Espy, M.D.

## 2020-03-06 ENCOUNTER — Encounter: Payer: Self-pay | Admitting: Urology

## 2020-03-06 ENCOUNTER — Telehealth: Payer: Self-pay | Admitting: *Deleted

## 2020-03-06 NOTE — Anesthesia Postprocedure Evaluation (Signed)
Anesthesia Post Note  Patient: Katrina Page  Procedure(s) Performed: CYSTOSCOPY/URETEROSCOPY/HOLMIUM LASER/STENT EXCHANGE (Left Ureter) CYSTOSCOPY WITH RETROGRADE PYELOGRAM (Left Ureter)  Patient location during evaluation: PACU Anesthesia Type: General Level of consciousness: awake and alert Pain management: pain level controlled Vital Signs Assessment: post-procedure vital signs reviewed and stable Respiratory status: spontaneous breathing, nonlabored ventilation and respiratory function stable Cardiovascular status: blood pressure returned to baseline and stable Postop Assessment: no apparent nausea or vomiting Anesthetic complications: no   No complications documented.   Last Vitals:  Vitals:   03/05/20 1238 03/05/20 1246  BP: (!) 133/95 (!) 163/81  Pulse: 65 65  Resp: 14 14  Temp: 36.4 C 36.5 C  SpO2: 98% 99%    Last Pain:  Vitals:   03/05/20 1246  TempSrc: Temporal  PainSc: [place]

## 2020-03-06 NOTE — Telephone Encounter (Signed)
Patient called in this morning and states she is having low back pain and her stent came out this morning. She is a little  nauseas but no fever or chills. Patient states she has not ate or drunk anything . She is not asking for pain medication.

## 2020-03-06 NOTE — Telephone Encounter (Signed)
Patient instructed, will continue Flomax and take ibuprofen. Voiced understanding.

## 2020-03-06 NOTE — Telephone Encounter (Signed)
Please reassure her.  It is normal to have some discomfort after the stent is removed for about 24 hours.  This should improve.  Ibuprofen is the most helpful for pain control.  Continue Flomax.  Call if she develops any fevers.  Hollice Espy, MD

## 2020-03-12 LAB — CALCULI, WITH PHOTOGRAPH (CLINICAL LAB)
Calcium Oxalate Dihydrate: 40 %
Calcium Oxalate Monohydrate: 20 %
Hydroxyapatite: 40 %
Weight Calculi: 105 mg

## 2020-03-24 ENCOUNTER — Emergency Department: Payer: Self-pay

## 2020-03-24 ENCOUNTER — Inpatient Hospital Stay
Admission: EM | Admit: 2020-03-24 | Discharge: 2020-03-27 | DRG: 694 | Disposition: A | Discharge status: Home / Self Care | Payer: Self-pay | Attending: Internal Medicine | Admitting: Internal Medicine

## 2020-03-24 ENCOUNTER — Other Ambulatory Visit: Payer: Self-pay

## 2020-03-24 DIAGNOSIS — D649 Anemia, unspecified: Secondary | ICD-10-CM | POA: Diagnosis present

## 2020-03-24 DIAGNOSIS — R6510 Systemic inflammatory response syndrome (SIRS) of non-infectious origin without acute organ dysfunction: Secondary | ICD-10-CM | POA: Diagnosis present

## 2020-03-24 DIAGNOSIS — Z20822 Contact with and (suspected) exposure to covid-19: Secondary | ICD-10-CM | POA: Diagnosis present

## 2020-03-24 DIAGNOSIS — E569 Vitamin deficiency, unspecified: Secondary | ICD-10-CM | POA: Diagnosis present

## 2020-03-24 DIAGNOSIS — G894 Chronic pain syndrome: Secondary | ICD-10-CM | POA: Diagnosis present

## 2020-03-24 DIAGNOSIS — F332 Major depressive disorder, recurrent severe without psychotic features: Secondary | ICD-10-CM

## 2020-03-24 DIAGNOSIS — R110 Nausea: Secondary | ICD-10-CM

## 2020-03-24 DIAGNOSIS — D519 Vitamin B12 deficiency anemia, unspecified: Secondary | ICD-10-CM

## 2020-03-24 DIAGNOSIS — N200 Calculus of kidney: Principal | ICD-10-CM | POA: Diagnosis present

## 2020-03-24 DIAGNOSIS — F418 Other specified anxiety disorders: Secondary | ICD-10-CM | POA: Diagnosis present

## 2020-03-24 DIAGNOSIS — F1010 Alcohol abuse, uncomplicated: Secondary | ICD-10-CM | POA: Diagnosis present

## 2020-03-24 DIAGNOSIS — N179 Acute kidney failure, unspecified: Secondary | ICD-10-CM

## 2020-03-24 DIAGNOSIS — E876 Hypokalemia: Secondary | ICD-10-CM

## 2020-03-24 DIAGNOSIS — Z79899 Other long term (current) drug therapy: Secondary | ICD-10-CM

## 2020-03-24 DIAGNOSIS — Z86711 Personal history of pulmonary embolism: Secondary | ICD-10-CM

## 2020-03-24 DIAGNOSIS — R1110 Vomiting, unspecified: Secondary | ICD-10-CM

## 2020-03-24 DIAGNOSIS — Z87442 Personal history of urinary calculi: Secondary | ICD-10-CM

## 2020-03-24 DIAGNOSIS — F17210 Nicotine dependence, cigarettes, uncomplicated: Secondary | ICD-10-CM | POA: Diagnosis present

## 2020-03-24 DIAGNOSIS — R112 Nausea with vomiting, unspecified: Secondary | ICD-10-CM | POA: Diagnosis present

## 2020-03-24 DIAGNOSIS — E278 Other specified disorders of adrenal gland: Secondary | ICD-10-CM

## 2020-03-24 DIAGNOSIS — A419 Sepsis, unspecified organism: Secondary | ICD-10-CM

## 2020-03-24 DIAGNOSIS — K529 Noninfective gastroenteritis and colitis, unspecified: Secondary | ICD-10-CM | POA: Diagnosis present

## 2020-03-24 DIAGNOSIS — R109 Unspecified abdominal pain: Secondary | ICD-10-CM

## 2020-03-24 LAB — LIPASE, BLOOD: Lipase: 39 U/L (ref 11–51)

## 2020-03-24 LAB — CBC WITH DIFFERENTIAL/PLATELET
Abs Immature Granulocytes: 0.07 10*3/uL (ref 0.00–0.07)
Basophils Absolute: 0.1 10*3/uL (ref 0.0–0.1)
Basophils Relative: 0 %
Eosinophils Absolute: 0 10*3/uL (ref 0.0–0.5)
Eosinophils Relative: 0 %
HCT: 36 % (ref 36.0–46.0)
Hemoglobin: 10.6 g/dL — ABNORMAL LOW (ref 12.0–15.0)
Immature Granulocytes: 1 %
Lymphocytes Relative: 3 %
Lymphs Abs: 0.4 10*3/uL — ABNORMAL LOW (ref 0.7–4.0)
MCH: 23.5 pg — ABNORMAL LOW (ref 26.0–34.0)
MCHC: 29.4 g/dL — ABNORMAL LOW (ref 30.0–36.0)
MCV: 79.6 fL — ABNORMAL LOW (ref 80.0–100.0)
Monocytes Absolute: 0.4 10*3/uL (ref 0.1–1.0)
Monocytes Relative: 3 %
Neutro Abs: 12 10*3/uL — ABNORMAL HIGH (ref 1.7–7.7)
Neutrophils Relative %: 93 %
Platelets: 178 10*3/uL (ref 150–400)
RBC: 4.52 MIL/uL (ref 3.87–5.11)
RDW: 23.3 % — ABNORMAL HIGH (ref 11.5–15.5)
Smear Review: NORMAL
WBC: 12.9 10*3/uL — ABNORMAL HIGH (ref 4.0–10.5)
nRBC: 0 % (ref 0.0–0.2)

## 2020-03-24 LAB — URINALYSIS, COMPLETE (UACMP) WITH MICROSCOPIC
Bacteria, UA: NONE SEEN
Bilirubin Urine: NEGATIVE
Glucose, UA: NEGATIVE mg/dL
Hgb urine dipstick: NEGATIVE
Ketones, ur: 5 mg/dL — AB
Leukocytes,Ua: NEGATIVE
Nitrite: NEGATIVE
Protein, ur: 30 mg/dL — AB
Specific Gravity, Urine: 1.016 (ref 1.005–1.030)
pH: 8 (ref 5.0–8.0)

## 2020-03-24 LAB — BLOOD GAS, VENOUS
Acid-Base Excess: 9.1 mmol/L — ABNORMAL HIGH (ref 0.0–2.0)
Bicarbonate: 32.7 mmol/L — ABNORMAL HIGH (ref 20.0–28.0)
FIO2: 0.21
O2 Saturation: 88.5 %
Patient temperature: 37
pCO2, Ven: 40 mmHg — ABNORMAL LOW (ref 44.0–60.0)
pH, Ven: 7.52 — ABNORMAL HIGH (ref 7.250–7.430)
pO2, Ven: 49 mmHg — ABNORMAL HIGH (ref 32.0–45.0)

## 2020-03-24 LAB — COMPREHENSIVE METABOLIC PANEL
ALT: 14 U/L (ref 0–44)
AST: 32 U/L (ref 15–41)
Albumin: 4.2 g/dL (ref 3.5–5.0)
Alkaline Phosphatase: 92 U/L (ref 38–126)
Anion gap: 22 — ABNORMAL HIGH (ref 5–15)
BUN: 8 mg/dL (ref 6–20)
CO2: 22 mmol/L (ref 22–32)
Calcium: 9.1 mg/dL (ref 8.9–10.3)
Chloride: 100 mmol/L (ref 98–111)
Creatinine, Ser: 1.24 mg/dL — ABNORMAL HIGH (ref 0.44–1.00)
GFR calc Af Amer: >60 mL/min (ref >60)
GFR calc non Af Amer: 54 mL/min — ABNORMAL LOW (ref >60)
Glucose, Bld: 130 mg/dL — ABNORMAL HIGH (ref 70–99)
Potassium: 3.2 mmol/L — ABNORMAL LOW (ref 3.5–5.1)
Sodium: 144 mmol/L (ref 135–145)
Total Bilirubin: 1.7 mg/dL — ABNORMAL HIGH (ref 0.3–1.2)
Total Protein: 8.2 g/dL — ABNORMAL HIGH (ref 6.5–8.1)

## 2020-03-24 LAB — POCT PREGNANCY, URINE: Preg Test, Ur: NEGATIVE

## 2020-03-24 LAB — SARS CORONAVIRUS 2 BY RT PCR (HOSPITAL ORDER, PERFORMED IN ~~LOC~~ HOSPITAL LAB): SARS Coronavirus 2: NEGATIVE

## 2020-03-24 LAB — LACTIC ACID, PLASMA
Lactic Acid, Venous: 2.9 mmol/L (ref 0.5–1.9)
Lactic Acid, Venous: 3.1 mmol/L (ref 0.5–1.9)
Lactic Acid, Venous: 2.1 mmol/L (ref 0.5–1.9)

## 2020-03-24 LAB — ETHANOL: Alcohol, Ethyl (B): <10 mg/dL (ref <10)

## 2020-03-24 LAB — MAGNESIUM: Magnesium: 1.4 mg/dL — ABNORMAL LOW (ref 1.7–2.4)

## 2020-03-24 MED ORDER — MORPHINE SULFATE (PF) 4 MG/ML IV SOLN
4.0000 mg | Freq: Once | INTRAVENOUS | Status: AC
  Administered 2020-03-24: 4 mg via INTRAVENOUS
  Filled 2020-03-24: qty 1

## 2020-03-24 MED ORDER — IOHEXOL 300 MG/ML  SOLN
100.0000 mL | Freq: Once | INTRAMUSCULAR | Status: AC | PRN
  Administered 2020-03-24: 100 mL via INTRAVENOUS

## 2020-03-24 MED ORDER — PANTOPRAZOLE SODIUM 40 MG IV SOLR
40.0000 mg | INTRAVENOUS | Status: DC
  Administered 2020-03-24 – 2020-03-25 (×2): 40 mg via INTRAVENOUS
  Filled 2020-03-24 (×2): qty 40

## 2020-03-24 MED ORDER — FOLIC ACID 1 MG PO TABS
1.0000 mg | ORAL_TABLET | Freq: Every day | ORAL | Status: DC
  Administered 2020-03-25 – 2020-03-27 (×3): 1 mg via ORAL
  Filled 2020-03-24 (×3): qty 1

## 2020-03-24 MED ORDER — SODIUM CHLORIDE 0.9 % IV SOLN
1.0000 g | INTRAVENOUS | Status: DC
  Administered 2020-03-24: 1 g via INTRAVENOUS
  Filled 2020-03-24: qty 10

## 2020-03-24 MED ORDER — ONDANSETRON HCL 4 MG/2ML IJ SOLN
4.0000 mg | Freq: Four times a day (QID) | INTRAMUSCULAR | Status: DC | PRN
  Administered 2020-03-24 – 2020-03-27 (×6): 4 mg via INTRAVENOUS
  Filled 2020-03-24 (×7): qty 2

## 2020-03-24 MED ORDER — MAGNESIUM SULFATE 2 GM/50ML IV SOLN
2.0000 g | Freq: Once | INTRAVENOUS | Status: AC
  Administered 2020-03-24: 2 g via INTRAVENOUS
  Filled 2020-03-24: qty 50

## 2020-03-24 MED ORDER — HYDROMORPHONE HCL 1 MG/ML IJ SOLN
0.5000 mg | INTRAMUSCULAR | Status: DC | PRN
  Administered 2020-03-25 (×2): 0.5 mg via INTRAVENOUS
  Filled 2020-03-24 (×2): qty 0.5

## 2020-03-24 MED ORDER — MORPHINE SULFATE (PF) 2 MG/ML IV SOLN
2.0000 mg | INTRAVENOUS | Status: DC | PRN
  Administered 2020-03-24 – 2020-03-25 (×3): 2 mg via INTRAVENOUS
  Filled 2020-03-24 (×3): qty 1

## 2020-03-24 MED ORDER — ONDANSETRON HCL 4 MG/2ML IJ SOLN
4.0000 mg | Freq: Once | INTRAMUSCULAR | Status: AC
  Administered 2020-03-24: 4 mg via INTRAVENOUS
  Filled 2020-03-24: qty 2

## 2020-03-24 MED ORDER — POTASSIUM CHLORIDE 10 MEQ/100ML IV SOLN
10.0000 meq | INTRAVENOUS | Status: AC
  Administered 2020-03-24 (×2): 10 meq via INTRAVENOUS
  Filled 2020-03-24 (×2): qty 100

## 2020-03-24 MED ORDER — METOCLOPRAMIDE HCL 5 MG/ML IJ SOLN
10.0000 mg | Freq: Once | INTRAMUSCULAR | Status: AC
  Administered 2020-03-24: 10 mg via INTRAVENOUS
  Filled 2020-03-24: qty 2

## 2020-03-24 MED ORDER — SODIUM CHLORIDE 0.9 % IV BOLUS: 1000.0000 mL | Freq: Once | INTRAVENOUS | Status: DC

## 2020-03-24 MED ORDER — SODIUM CHLORIDE 0.9 % IV BOLUS (SEPSIS)
1000.0000 mL | Freq: Once | INTRAVENOUS | Status: AC
  Administered 2020-03-24: 1000 mL via INTRAVENOUS

## 2020-03-24 MED ORDER — VITAMIN B-12 1000 MCG PO TABS
1000.0000 ug | ORAL_TABLET | Freq: Every day | ORAL | Status: DC
  Administered 2020-03-25 – 2020-03-27 (×3): 1000 ug via ORAL
  Filled 2020-03-24 (×3): qty 1

## 2020-03-24 MED ORDER — PROMETHAZINE HCL 25 MG/ML IJ SOLN
12.5000 mg | Freq: Four times a day (QID) | INTRAMUSCULAR | Status: DC | PRN
  Administered 2020-03-24 – 2020-03-27 (×9): 12.5 mg via INTRAVENOUS
  Filled 2020-03-24 (×9): qty 1

## 2020-03-24 MED ORDER — SODIUM CHLORIDE 0.9 % IV SOLN: INTRAVENOUS | Status: DC

## 2020-03-24 MED ORDER — VENLAFAXINE HCL ER 75 MG PO CP24
75.0000 mg | ORAL_CAPSULE | Freq: Every day | ORAL | Status: DC
  Administered 2020-03-25 – 2020-03-27 (×3): 75 mg via ORAL
  Filled 2020-03-24 (×3): qty 1

## 2020-03-24 MED ORDER — ENOXAPARIN SODIUM 40 MG/0.4ML ~~LOC~~ SOLN
40.0000 mg | SUBCUTANEOUS | Status: DC
  Administered 2020-03-24 – 2020-03-26 (×3): 40 mg via SUBCUTANEOUS
  Filled 2020-03-24 (×3): qty 0.4

## 2020-03-24 MED ORDER — SODIUM CHLORIDE 0.9 % IV BOLUS
500.0000 mL | Freq: Once | INTRAVENOUS | Status: AC
  Administered 2020-03-24: 500 mL via INTRAVENOUS

## 2020-03-24 MED ORDER — TRAZODONE HCL 100 MG PO TABS
200.0000 mg | ORAL_TABLET | Freq: Every day | ORAL | Status: DC
  Administered 2020-03-25 – 2020-03-26 (×3): 200 mg via ORAL
  Filled 2020-03-24 (×3): qty 2

## 2020-03-24 NOTE — ED Notes (Signed)
hospitalist in to see pt.

## 2020-03-24 NOTE — ED Triage Notes (Signed)
Pt arrives via EMS from home after her started having n/v last night- per EMS pt was hospitalized for low potassium and pt states she feels similar to when that happened- pt extremely shaky and tearful- cbg per EMS 138

## 2020-03-24 NOTE — ED Notes (Signed)
Lab here for blood cultures.

## 2020-03-24 NOTE — ED Provider Notes (Signed)
[facility] Emergency Department Provider Note   ____________________________________________   First MD Initiated Contact with Patient 03/24/20 1116     (approximate)  I have reviewed the triage vital signs and the nursing notes.   HISTORY  Chief Complaint Emesis    HPI Katrina Page is a 39 y.o. female history of kidney stones and previous UTI  Patient presents today reports that she began experiencing nausea vomiting and lower back pain last night.  She is continued to have frequent nausea and vomiting.   Patient reports she has a feeling of abdominal pain, nausea and vomiting.  No chest pain or trouble breathing.  No fevers.  Symptoms started yesterday continued through last night with several episodes of vomiting yellow vomit.  Reports she feels very similar in the little shaky and fatigued to how she was when she came into the hospital about a month ago and needed to have a kidney stent for infection.  She follows with Dr. Erlene Quan and had the stent removed  Patient reports to me that she does not use heavy alcohol.  She will have perhaps 1-2 alcoholic drinks per week, has not had a drink for several days and denies heavy alcohol use on a regular basis.  Past Medical History:  Diagnosis Date  . Adrenal nodule ([place]) 06/2019   left  . Anemia   . Bronchitis   . Depression   . History of kidney stones   . Migraines   . Nodule of right lung 06/2019  . Pulmonary emboli ([place]) 07/23/2019   bilateral  . Thrombocytopenia [facility])     Patient Active Problem List   Diagnosis Date Noted  . Thrombocytopenia ([place]) 02/10/2020  . Intractable nausea and vomiting 02/09/2020  . Hypokalemia due to excessive gastrointestinal loss of potassium   . Hypomagnesemia   . Left adrenal mass ([place])   . Obstructive uropathy   . Severe recurrent major depression without psychotic features ([place]) 12/29/2019  . Suicide attempt by drug overdose ([place]) 12/29/2019  . Chronic  back pain 12/29/2019  . Nicotine abuse 12/29/2019  . MDD (major depressive disorder) 12/28/2019  . MDD (major depressive disorder), severe ([place]) 12/24/2019  . Alcohol abuse 12/24/2019  . Left renal stone 08/25/2019  . Chest pain 07/24/2019  . Dyspnea 07/24/2019  . Tobacco use 07/24/2019  . Pulmonary embolism ([place]) 07/23/2019  . Sepsis ([place]) 11/29/2018    Past Surgical History:  Procedure Laterality Date  . APPENDECTOMY    . CHOLECYSTECTOMY    . CYSTOSCOPY W/ RETROGRADES Left 03/05/2020   Procedure: CYSTOSCOPY WITH RETROGRADE PYELOGRAM;  Surgeon: Hollice Espy, MD;  Location: [facility] ORS;  Service: Urology;  Laterality: Left;  . CYSTOSCOPY WITH STENT PLACEMENT Left 02/09/2020   Procedure: CYSTOSCOPY WITH STENT PLACEMENT;  Surgeon: Hollice Espy, MD;  Location: [facility] ORS;  Service: Urology;  Laterality: Left;  . CYSTOSCOPY/URETEROSCOPY/HOLMIUM LASER/STENT PLACEMENT Left 03/05/2020   Procedure: CYSTOSCOPY/URETEROSCOPY/HOLMIUM LASER/STENT EXCHANGE;  Surgeon: Hollice Espy, MD;  Location: [facility] ORS;  Service: Urology;  Laterality: Left;  . TUBAL LIGATION      Prior to Admission medications   Medication Sig Start Date End Date Taking? Authorizing Provider  albuterol (VENTOLIN HFA) 108 (90 Base) MCG/ACT inhaler Inhale 1-2 puffs into the lungs every 4 (four) hours as needed for wheezing or shortness of breath. 01/02/20  Yes Clapacs, Madie Reno, MD  folic acid (FOLVITE) 1 MG tablet Take 1 tablet (1 mg total) by mouth daily. 02/13/20  Yes Lorella Nimrod, MD  oxybutynin (  DITROPAN) 5 MG tablet Take 1 tablet (5 mg total) by mouth every 8 (eight) hours as needed for bladder spasms. 02/12/20  Yes Lorella Nimrod, MD  vitamin B-12 1000 MCG tablet Take 1 tablet (1,000 mcg total) by mouth daily. 02/13/20  Yes Lorella Nimrod, MD  hydrOXYzine (ATARAX/VISTARIL) 50 MG tablet Take 1 tablet (50 mg total) by mouth 3 (three) times daily as needed for anxiety. 01/02/20   Clapacs, Madie Reno, MD  ondansetron (ZOFRAN) 4 MG tablet  Take 1 tablet (4 mg total) by mouth every 6 (six) hours as needed for nausea. 02/12/20   Lorella Nimrod, MD  opium-belladonna (B&O SUPPRETTES) 16.2-60 MG suppository Place 1 suppository rectally every 12 (twelve) hours as needed for bladder spasms. Patient not taking: Reported on 03/05/2020 02/12/20   Lorella Nimrod, MD  Oxycodone HCl 10 MG TABS Take 1 tablet (10 mg total) by mouth every 6 (six) hours as needed (moderate to severe pain). 03/05/20   Hollice Espy, MD  potassium chloride SA (KLOR-CON) 20 MEQ tablet Take 1 tablet (20 mEq total) by mouth daily. 02/13/20   Lorella Nimrod, MD  tamsulosin (FLOMAX) 0.4 MG CAPS capsule Take 1 capsule (0.4 mg total) by mouth daily. 02/27/20   Hollice Espy, MD  traZODone (DESYREL) 100 MG tablet Take 2 tablets (200 mg total) by mouth at bedtime. 01/02/20   Clapacs, Madie Reno, MD  venlafaxine XR (EFFEXOR-XR) 75 MG 24 hr capsule Take 1 capsule (75 mg total) by mouth daily with breakfast. 01/02/20   Clapacs, Madie Reno, MD    Allergies Patient has no known allergies.  Family History  Problem Relation Age of Onset  . Hypertension Mother   . Diabetes Mother   . Stroke Mother   . Breast cancer Mother     Social History Social History   Tobacco Use  . Smoking status: Current Every Day Smoker    Packs/day: 0.50    Years: 20.00    Pack years: 10.00    Types: Cigarettes  . Smokeless tobacco: Never Used  Vaping Use  . Vaping Use: Never used  Substance Use Topics  . Alcohol use: Yes    Alcohol/week: 2.0 - 3.0 standard drinks    Types: 2 - 3 Shots of liquor per week    Comment: 2-3 week.    . Drug use: No    Review of Systems Constitutional: No fever/chills but feels very fatigued and generally weak Eyes: No visual changes. ENT: No sore throat. Cardiovascular: Denies chest pain. Respiratory: Denies shortness of breath. Gastrointestinal: See HPI Genitourinary: Has not noticed any urinary symptoms or dark urine Musculoskeletal: Pain seems to radiate some  towards her back possibly more on the right flank Skin: Negative for rash. Neurological: Negative for headaches, areas of focal weakness or numbness.    ____________________________________________   PHYSICAL EXAM:  VITAL SIGNS: ED Triage Vitals  Enc Vitals Group     BP 03/24/20 1109 (!) 126/102     Pulse Rate 03/24/20 1109 94     Resp 03/24/20 1109 (!) 22     Temp --      Temp src --      SpO2 03/24/20 1109 100 %     Weight 03/24/20 1106 200 lb 9.9 oz (91 kg)     Height 03/24/20 1106 5\' 9"  (1.753 m)     Head Circumference --      Peak Flow --      Pain Score 03/24/20 1106 10     Pain Loc --  Pain Edu? --      Excl. in [place]? --     Constitutional: Alert and oriented.  She appears in mild distress, fatigue, laying on her side very nauseated appearance also appearing in pain. Eyes: Conjunctivae are normal. Head: Atraumatic. Nose: No congestion/rhinnorhea. Mouth/Throat: Mucous membranes are moist. Neck: No stridor.  Cardiovascular: Normal rate, regular rhythm. Grossly normal heart sounds.  Good peripheral circulation. Respiratory: Normal respiratory effort.  No retractions. Lungs CTAB. Gastrointestinal: Soft and mild tenderness, none seemingly focally except perhaps some in the right flank region and epigastrium. No distention.  Moderate right-sided CVA tenderness.  No noted CVA tenderness on the left Musculoskeletal: No lower extremity tenderness nor edema.  She has mild tremulousness in her upper extremities bilateral Neurologic:  Normal speech and language. No gross focal neurologic deficits are appreciated.  Skin:  Skin is warm, slightly diaphoretic and intact. No rash noted. Psychiatric: Mood and affect are normal. Speech and behavior are normal.  ____________________________________________   LABS (all labs ordered are listed, but only abnormal results are displayed)  Labs Reviewed  COMPREHENSIVE METABOLIC PANEL - Abnormal; Notable for the following components:        Result Value   Potassium 3.2 (*)    Glucose, Bld 130 (*)    Creatinine, Ser 1.24 (*)    Total Protein 8.2 (*)    Total Bilirubin 1.7 (*)    GFR calc non Af Amer 54 (*)    Anion gap 22 (*)    All other components within normal limits  URINALYSIS, COMPLETE (UACMP) WITH MICROSCOPIC - Abnormal; Notable for the following components:   Color, Urine YELLOW (*)    APPearance CLEAR (*)    Ketones, ur 5 (*)    Protein, ur 30 (*)    All other components within normal limits  CBC WITH DIFFERENTIAL/PLATELET - Abnormal; Notable for the following components:   WBC 12.9 (*)    Hemoglobin 10.6 (*)    MCV 79.6 (*)    MCH 23.5 (*)    MCHC 29.4 (*)    RDW 23.3 (*)    Neutro Abs 12.0 (*)    Lymphs Abs 0.4 (*)    All other components within normal limits  MAGNESIUM - Abnormal; Notable for the following components:   Magnesium 1.4 (*)    All other components within normal limits  LACTIC ACID, PLASMA - Abnormal; Notable for the following components:   Lactic Acid, Venous 2.9 (*)    All other components within normal limits  URINE CULTURE  LIPASE, BLOOD  ETHANOL  BLOOD GAS, VENOUS  POC URINE PREG, ED  POCT PREGNANCY, URINE   ____________________________________________  EKG  Reviewed inter by me at 1130 Heart rate 95 QRS 130 QTc 480 Normal sinus rhythm, significant artifact, likely as the patient has some tremulousness in her extremities.  Difficult to obtain good baseline tracing, but overall sinus rhythm without noted acute ST abnormality, but again significant limitation in some leads due to artifact ____________________________________________  RADIOLOGY  CT abdomen pelvis pending at time of signout ____________________________________________   PROCEDURES  Procedure(s) performed: None  Procedures  Critical Care performed: No  ____________________________________________   INITIAL IMPRESSION / ASSESSMENT AND PLAN / ED COURSE  Pertinent labs & imaging results that  were available during my care of the patient were reviewed by me and considered in my medical decision making (see chart for details).   Patient presents for evaluation of fatigue nausea vomiting.  Mild tremulousness in her upper extremities bilateral.  No chest pain.  No cardiac or pulmonary symptoms.  Mild leukocytosis, patient with history of recent urologic infection and stenting.  Stent has since been removed.  She does note right-sided discomfort on exam and some right CVA tenderness.  Concern for possible urologic etiology, though other acute intra-abdominal pathologies are certainly considered.  We will proceed with imaging.  Potassium and magnesium being repleted and fluid hydration being given.  Elevated anion gap, slightly elevated lactic acid.  Await imaging studies.  Await urinalysis.  Ongoing care assigned to Dr. Cherylann Banas at 4 PM.  I suspect the patient will likely require admission, but further ED work-up indicated     ____________________________________________   FINAL CLINICAL IMPRESSION(S) / ED DIAGNOSES  Final diagnoses:  Abdominal pain, unspecified abdominal location  Intractable vomiting with nausea, unspecified vomiting type        Note:  This document was prepared using Dragon voice recognition software and may include unintentional dictation errors       Delman Kitten, MD 03/24/20 1610

## 2020-03-24 NOTE — ED Notes (Signed)
Attempt to call report, rn not available. 

## 2020-03-24 NOTE — ED Notes (Signed)
Dr Jacqualine Code aware of lactic 2.9

## 2020-03-24 NOTE — ED Notes (Signed)
Lab still here drawing blood cultures.

## 2020-03-24 NOTE — ED Provider Notes (Signed)
-----------------------------------------   6:31 PM on 03/24/2020 -----------------------------------------  I took over care of this patient from Dr. Jacqualine Code.  The patient was pending a CT of the abdomen and clinical reassessment.  CT shows some remaining stranding around her left kidney but it appears improved from her most recent imaging and there is no evidence of new obstruction or other acute intra-abdominal findings.  Urinalysis shows no evidence of infection.  The patient's initial lactate was slightly elevated although it appears she only got 500 cc of fluid initially.  Repeat lactate is 3.1 and the patient continues to have intractable nausea and is not able to tolerate p.o.  Although my clinical suspicion for any acute infection/sepsis is low and I suspect that the elevated lactate is related to the persistent vomiting and dehydration, given the worsening lactate and the patient meeting SIRS criteria based on heart rate and WBC count, I have ordered empiric ceftriaxone and additional fluids per the sepsis protocol.  I discussed the case with Dr. Flossie Buffy from the hospitalist service for admission.   Arta Silence, MD 03/24/20 [phone]

## 2020-03-24 NOTE — ED Notes (Signed)
This RN attempted blood cultures without success, lab called to draw blood cultures.

## 2020-03-24 NOTE — ED Notes (Signed)
Patient transported to CT 

## 2020-03-24 NOTE — H&P (Signed)
History and Physical    Katrina Page JJK:[id number] DOB: 03/12/1981 DOA: 03/24/2020  PCP: Patient, No Pcp Per  Patient coming from: Home  I have personally briefly reviewed patient's old medical records in [facility]  Chief Complaint: nausea and vomiting  HPI: Katrina Page is a 39 y.o. female with medical history significant for  History of nephrolithiasis, pulmonary embolism previously on Xarelto, depression, alcohol abuse who presents with persistent nausea and vomiting.  Yesterday she began to have acute nausea and nonbloody, bilious vomiting up to 15 times.  Also notes diffuse burning abdominal pain and worsening lower paraspinal back pain.  Notes she has chronic back pain but it is worse since she has been vomiting.  She denies any diarrhea.  Her last bowel movement was 2 days ago.  Denies any fever but has felt hot and cold.  No sick contact.  Endorses last alcohol use being about 5 to 6 days ago where she had 3-4 shots of liquor.  Denies illicit drug use. Abdominal history includes appendectomy, cholecystectomy, urethral stent.  Of note, patient was recently hospitalized in May for sepsis for obstructing urethral stone s/p emergent stent placement.  This was subsequently removed on 03/05/2020.  In the ED, she had temperature of 99.59F, intermittently hypertensive at times up to 160s and borderline tachycardic.  Lab work notable for leukocytosis of 12.9, stable anemia with hemoglobin of 10.6.  Lactate of 2.9 and 3.1. Mild hypokalemia of 3.2, AKI with creatinine of 1.24 from a prior of 0.87 a month ago.  Magnesium 1.4.  Anion gap of 22.  UA was negative for nitrite and leukocyte with no bacteria seen.  CT abdomen and pelvis shows stranding about the left renal collection system but this appears improved from prior.  Multiple punctate nonobstructing bilateral renal stones negative for hydronephrosis.  Bilateral adrenal lesions compatible with adenoma seen on previous  exam.  She was given 3 L of IV normal saline fluid.  Potassium, magnesium.  Also given total of 20 mg of Reglan.  Total of 8 mg of morphine.  Total of 8 mg of Zofran.  She was also started on Rocephin for presumed UTI.  Review of Systems:  Constitutional: No Weight Change, No Fever ENT/Mouth: No sore throat, No Rhinorrhea Eyes: No Eye Pain, No Vision Changes Cardiovascular: No Chest Pain, no SOB Respiratory: No Cough, No Sputum, No Wheezing, no Dyspnea  Gastrointestinal: + Nausea, + Vomiting, No Diarrhea, No Constipation, + Pain Genitourinary: no Urinary Incontinence Musculoskeletal: No Arthralgias, No Myalgias Skin: No Skin Lesions, No Pruritus, Neuro: no Weakness, No Numbness Psych: No Anxiety/Panic, No Depression, + decrease appetite Heme/Lymph: No Bruising, No Bleeding  Past Medical History:  Diagnosis Date  . Adrenal nodule ([place]) 06/2019   left  . Anemia   . Bronchitis   . Depression   . History of kidney stones   . Migraines   . Nodule of right lung 06/2019  . Pulmonary emboli ([place]) 07/23/2019   bilateral  . Thrombocytopenia ([place])     Past Surgical History:  Procedure Laterality Date  . APPENDECTOMY    . CHOLECYSTECTOMY    . CYSTOSCOPY W/ RETROGRADES Left 03/05/2020   Procedure: CYSTOSCOPY WITH RETROGRADE PYELOGRAM;  Surgeon: Hollice Espy, MD;  Location: [facility];  Service: Urology;  Laterality: Left;  . CYSTOSCOPY WITH STENT PLACEMENT Left 02/09/2020   Procedure: CYSTOSCOPY WITH STENT PLACEMENT;  Surgeon: Hollice Espy, MD;  Location: [facility];  Service: Urology;  Laterality: Left;  . CYSTOSCOPY/URETEROSCOPY/HOLMIUM LASER/STENT  PLACEMENT Left 03/05/2020   Procedure: CYSTOSCOPY/URETEROSCOPY/HOLMIUM LASER/STENT EXCHANGE;  Surgeon: Hollice Espy, MD;  Location: [facility];  Service: Urology;  Laterality: Left;  . TUBAL LIGATION       reports that she has been smoking cigarettes. She has a 10.00 pack-year smoking history. She has never used smokeless tobacco. She  reports current alcohol use of about 2.0 - 3.0 standard drinks of alcohol per week. She reports that she does not use drugs.  No Known Allergies  Family History  Problem Relation Age of Onset  . Hypertension Mother   . Diabetes Mother   . Stroke Mother   . Breast cancer Mother      Prior to Admission medications   Medication Sig Start Date End Date Taking? Authorizing Provider  albuterol (VENTOLIN HFA) 108 (90 Base) MCG/ACT inhaler Inhale 1-2 puffs into the lungs every 4 (four) hours as needed for wheezing or shortness of breath. 01/02/20  Yes Clapacs, Madie Reno, MD  folic acid (FOLVITE) 1 MG tablet Take 1 tablet (1 mg total) by mouth daily. 02/13/20  Yes Lorella Nimrod, MD  hydrOXYzine (ATARAX/VISTARIL) 50 MG tablet Take 1 tablet (50 mg total) by mouth 3 (three) times daily as needed for anxiety. 01/02/20  Yes Clapacs, Madie Reno, MD  ondansetron (ZOFRAN) 4 MG tablet Take 1 tablet (4 mg total) by mouth every 6 (six) hours as needed for nausea. 02/12/20  Yes Lorella Nimrod, MD  oxybutynin (DITROPAN) 5 MG tablet Take 1 tablet (5 mg total) by mouth every 8 (eight) hours as needed for bladder spasms. 02/12/20  Yes Lorella Nimrod, MD  Oxycodone HCl 10 MG TABS Take 1 tablet (10 mg total) by mouth every 6 (six) hours as needed (moderate to severe pain). 03/05/20  Yes Hollice Espy, MD  traZODone (DESYREL) 100 MG tablet Take 2 tablets (200 mg total) by mouth at bedtime. 01/02/20  Yes Clapacs, Madie Reno, MD  venlafaxine XR (EFFEXOR-XR) 75 MG 24 hr capsule Take 1 capsule (75 mg total) by mouth daily with breakfast. 01/02/20  Yes Clapacs, Madie Reno, MD  vitamin B-12 1000 MCG tablet Take 1 tablet (1,000 mcg total) by mouth daily. 02/13/20  Ezekiel Slocumb, MD    Physical Exam: Vitals:   03/24/20 1702 03/24/20 1802 03/24/20 1830 03/24/20 1832  BP: (!) 153/69 (!) 152/87 (!) 169/85   Pulse: 85 91 (!) 101 94  Resp: 18 14 15  (!) 21  Temp:      TempSrc:      SpO2: 100% 97% 100% 100%  Weight:      Height:         Constitutional: Tremulous and in discomfort Vitals:   03/24/20 1702 03/24/20 1802 03/24/20 1830 03/24/20 1832  BP: (!) 153/69 (!) 152/87 (!) 169/85   Pulse: 85 91 (!) 101 94  Resp: 18 14 15  (!) 21  Temp:      TempSrc:      SpO2: 100% 97% 100% 100%  Weight:      Height:       Eyes: PERRL, lids and conjunctivae normal ENMT: Mucous membranes are moist.  Neck: normal, supple Respiratory: clear to auscultation bilaterally, no wheezing, no crackles. Normal respiratory effort. No accessory muscle use.  Cardiovascular: Regular rate and rhythm, no murmurs / rubs / gallops. No extremity edema.  Abdomen: Mild tenderness to epigastric region, no rebound tenderness, guarding or rigidity., no masses palpated. Bowel sounds positive.  Musculoskeletal: no clubbing / cyanosis. No joint deformity upper and lower extremities. Good ROM, no  contractures. Normal muscle tone.  Back: Tenderness to palpation of bilateral para lumbar spinal musculature, no midline spinal pain.  No obvious deformities or step-off.   Skin: no rashes, lesions, ulcers. No induration Neurologic: CN 2-12 grossly intact. Sensation intact,  Strength 5/5 in all 4.  Psychiatric: Normal judgment and insight. Alert and oriented x 3.  Appears in discomfort and somewhat tearful.  Labs on Admission: I have personally reviewed following labs and imaging studies  CBC: Recent Labs  Lab 03/24/20 1111  WBC 12.9*  NEUTROABS 12.0*  HGB 10.6*  HCT 36.0  MCV 79.6*  PLT 017   Basic Metabolic Panel: Recent Labs  Lab 03/24/20 1111  NA 144  K 3.2*  CL 100  CO2 22  GLUCOSE 130*  BUN 8  CREATININE 1.24*  CALCIUM 9.1  MG 1.4*   GFR: Estimated Creatinine Clearance: 72.5 mL/min (A) (by C-G formula based on SCr of 1.24 mg/dL (H)). Liver Function Tests: Recent Labs  Lab 03/24/20 1111  AST 32  ALT 14  ALKPHOS 92  BILITOT 1.7*  PROT 8.2*  ALBUMIN 4.2   Recent Labs  Lab 03/24/20 1111  LIPASE 39   No results for input(s):  AMMONIA in the last 168 hours. Coagulation Profile: No results for input(s): INR, PROTIME in the last 168 hours. Cardiac Enzymes: No results for input(s): CKTOTAL, CKMB, CKMBINDEX, TROPONINI in the last 168 hours. BNP (last 3 results) No results for input(s): PROBNP in the last 8760 hours. HbA1C: No results for input(s): HGBA1C in the last 72 hours. CBG: No results for input(s): GLUCAP in the last 168 hours. Lipid Profile: No results for input(s): CHOL, HDL, LDLCALC, TRIG, CHOLHDL, LDLDIRECT in the last 72 hours. Thyroid Function Tests: No results for input(s): TSH, T4TOTAL, FREET4, T3FREE, THYROIDAB in the last 72 hours. Anemia Panel: No results for input(s): VITAMINB12, FOLATE, FERRITIN, TIBC, IRON, RETICCTPCT in the last 72 hours. Urine analysis:    Component Value Date/Time   COLORURINE YELLOW (A) 03/24/2020 1525   APPEARANCEUR CLEAR (A) 03/24/2020 1525   APPEARANCEUR Cloudy (A) 02/27/2020 1406   LABSPEC 1.016 03/24/2020 1525   LABSPEC 1.024 01/03/2013 1856   PHURINE 8.0 03/24/2020 1525   GLUCOSEU NEGATIVE 03/24/2020 1525   GLUCOSEU Negative 01/03/2013 1856   HGBUR NEGATIVE 03/24/2020 1525   BILIRUBINUR NEGATIVE 03/24/2020 1525   BILIRUBINUR Negative 02/27/2020 1406   BILIRUBINUR Negative 01/03/2013 1856   KETONESUR 5 (A) 03/24/2020 1525   PROTEINUR 30 (A) 03/24/2020 1525   NITRITE NEGATIVE 03/24/2020 1525   LEUKOCYTESUR NEGATIVE 03/24/2020 1525   LEUKOCYTESUR 1+ 01/03/2013 1856    Radiological Exams on Admission: CT ABDOMEN PELVIS W CONTRAST  Result Date: 03/24/2020 CLINICAL DATA:  Onset nausea and vomiting last night. EXAM: CT ABDOMEN AND PELVIS WITH CONTRAST TECHNIQUE: Multidetector CT imaging of the abdomen and pelvis was performed using the standard protocol following bolus administration of intravenous contrast. CONTRAST:  100 mL OMNIPAQUE IOHEXOL 300 MG/ML  SOLN COMPARISON:  CT abdomen and pelvis 02/11/2020. PET CT scan 08/23/2019. FINDINGS: Lower chest: Lung bases  are clear. No pleural or pericardial effusion. Hepatobiliary: No focal liver abnormality is seen. Status post cholecystectomy. No biliary dilatation. Fatty infiltration of the liver again seen. Pancreas: Unremarkable. No pancreatic ductal dilatation or surrounding inflammatory changes. Spleen: Normal in size without focal abnormality. Adrenals/Urinary Tract: Bilateral adrenal lesions are again seen. The larger lesion is on the left and measures 5.9 x 4.3 cm in the axial plane on image 30. Right adrenal nodule measures 1.7 x  1.3 cm on image 22. These lesions have been characterized as adenomas on prior PET CT scan. The patient has multiple punctate nonobstructing renal stones in both kidneys. There is also scarring of both kidneys, more extensive on the left. Small cyst in the upper pole of the left kidney and a cyst in lower pole the right kidney are unchanged. Stranding is seen in left renal hilum and about the renal pelvis, improved since the most recent CT. Double-J ureteral stent on the left has been removed since the prior CT. The right ureter appears normal. Urinary bladder is unremarkable. Stomach/Bowel: Stomach is within normal limits. The patient is status post appendectomy. No evidence of bowel wall thickening, distention, or inflammatory changes. Mild sigmoid diverticulosis noted. Vascular/Lymphatic: No significant vascular findings are present. No enlarged abdominal or pelvic lymph nodes. Reproductive: Uterus and bilateral adnexa are unremarkable. Other: None. Musculoskeletal: Negative. IMPRESSION: Stranding about the left renal collecting system and pelvis appears improved compared to the most recent CT but could be infectious or inflammatory. Left double-J ureteral stent has been removed since the most recent exam. Multiple punctate nonobstructing bilateral renal stones. Negative for hydronephrosis. Fatty infiltration of the liver. Bilateral adrenal lesions compatible with adenomas as seen on prior  exams. Mild diverticulosis without diverticulitis. Electronically Signed   By: Inge Rise M.D.   On: 03/24/2020 16:33      Assessment/Plan Sepsis present on admission secondary to possible gastroenteritis Patient has had complicated nephrolithiasis history in the past 2 months but UA is completely negative today.  Her CT abdomen and pelvis also showed improvement in left renal stranding and there is no obstructing stones. She has received one-time dose of IV Rocephin in the ED.  Will discontinue. Continue to monitor with IV normal saline fluid. Pending blood and urine culture. PRN antiemetic and pain control IV PPI daily  AKI Creatinine elevated to 1.24 from prior of 0.87 Continuous IV fluids.  Avoid nephrotoxic agent.  Hypokalemia  Replete  Hypomagnesemia Replete  Adrenal mass This is being followed outpatient by urology.  Patient had endocrinology follow-up planned for metabolic evaluation but reportedly did not follow-up.  Will need further metabolic evaluation outpatient. There is mention of a eventual left adrenalectomy planned per neurology documentation.  Anemia secondary to vitamin deficiency Continue folate and vitamin B12  Anxiety/depression Continue trazodone and venlafaxine  DVT prophylaxis:.Lovenox Code Status: Full Family Communication: Plan discussed with patient at bedside  disposition Plan: Home with at least 2 midnight stays  Consults called:  Admission status: inpatient   Remains inpatient appropriate because:IV treatments appropriate due to intensity of illness or inability to take PO   Dispo: The patient is from: Home              Anticipated d/c is to: Home              Anticipated d/c date is: 3 days              Patient currently is not medically stable to d/c.         Orene Desanctis DO [facility]   If 7PM-7AM, please contact night-coverage [email or web address]   03/24/2020, 7:03 PM

## 2020-03-25 LAB — BASIC METABOLIC PANEL
Anion gap: 11 (ref 5–15)
BUN: 6 mg/dL (ref 6–20)
CO2: 25 mmol/L (ref 22–32)
Calcium: 8.1 mg/dL — ABNORMAL LOW (ref 8.9–10.3)
Chloride: 106 mmol/L (ref 98–111)
Creatinine, Ser: 0.93 mg/dL (ref 0.44–1.00)
GFR calc Af Amer: >60 mL/min (ref >60)
GFR calc non Af Amer: >60 mL/min (ref >60)
Glucose, Bld: 91 mg/dL (ref 70–99)
Potassium: 3.2 mmol/L — ABNORMAL LOW (ref 3.5–5.1)
Sodium: 142 mmol/L (ref 135–145)

## 2020-03-25 LAB — CBC
HCT: 30 % — ABNORMAL LOW (ref 36.0–46.0)
Hemoglobin: 8.5 g/dL — ABNORMAL LOW (ref 12.0–15.0)
MCH: 22.9 pg — ABNORMAL LOW (ref 26.0–34.0)
MCHC: 28.3 g/dL — ABNORMAL LOW (ref 30.0–36.0)
MCV: 80.9 fL (ref 80.0–100.0)
Platelets: 124 10*3/uL — ABNORMAL LOW (ref 150–400)
RBC: 3.71 MIL/uL — ABNORMAL LOW (ref 3.87–5.11)
RDW: 23.3 % — ABNORMAL HIGH (ref 11.5–15.5)
WBC: 7 10*3/uL (ref 4.0–10.5)
nRBC: 0 % (ref 0.0–0.2)

## 2020-03-25 LAB — LACTIC ACID, PLASMA: Lactic Acid, Venous: 1.6 mmol/L (ref 0.5–1.9)

## 2020-03-25 MED ORDER — OXYBUTYNIN CHLORIDE 5 MG PO TABS: 5.0000 mg | ORAL_TABLET | Freq: Three times a day (TID) | ORAL | Status: DC | PRN

## 2020-03-25 MED ORDER — MORPHINE SULFATE (PF) 2 MG/ML IV SOLN
2.0000 mg | INTRAVENOUS | Status: DC | PRN
  Administered 2020-03-25 – 2020-03-26 (×4): 2 mg via INTRAVENOUS
  Filled 2020-03-25 (×4): qty 1

## 2020-03-25 MED ORDER — ALBUTEROL SULFATE (2.5 MG/3ML) 0.083% IN NEBU: 2.5000 mg | INHALATION_SOLUTION | RESPIRATORY_TRACT | Status: DC | PRN

## 2020-03-25 MED ORDER — OXYCODONE HCL 5 MG PO TABS
10.0000 mg | ORAL_TABLET | Freq: Four times a day (QID) | ORAL | Status: DC | PRN
  Administered 2020-03-25 – 2020-03-27 (×7): 10 mg via ORAL
  Filled 2020-03-25 (×8): qty 2

## 2020-03-25 MED ORDER — HYDRALAZINE HCL 20 MG/ML IJ SOLN: 10.0000 mg | Freq: Four times a day (QID) | INTRAMUSCULAR | Status: DC | PRN

## 2020-03-25 NOTE — Progress Notes (Signed)
[facility] [place] NAME: Katrina Page    MR#:  [id number]  DATE OF BIRTH:  [birth year]  SUBJECTIVE:  Came in with vomiting started Friday evening. BM on Saturday No diarrhea Feels weak chronic pain  tolerating some grits in am  REVIEW OF SYSTEMS:   Review of Systems  Constitutional: Negative for chills, fever and weight loss.  HENT: Negative for ear discharge, ear pain and nosebleeds.   Eyes: Negative for blurred vision, pain and discharge.  Respiratory: Negative for sputum production, shortness of breath, wheezing and stridor.   Cardiovascular: Negative for chest pain, palpitations, orthopnea and PND.  Gastrointestinal: Positive for nausea. Negative for abdominal pain, diarrhea and vomiting.  Genitourinary: Negative for frequency and urgency.  Musculoskeletal: Positive for back pain and joint pain.  Neurological: Positive for weakness. Negative for sensory change, speech change and focal weakness.  Psychiatric/Behavioral: Negative for depression and hallucinations. The patient is not nervous/anxious.    Tolerating Diet:FLD Tolerating PT: pending  DRUG ALLERGIES:  No Known Allergies  VITALS:  Blood pressure (!) 144/75, pulse 89, temperature 98.7 F (37.1 C), temperature source Oral, resp. rate 15, height 5\' 9"  (1.753 m), weight 91 kg, SpO2 100 %.  PHYSICAL EXAMINATION:   Physical Exam  GENERAL:  39 y.o.-year-old patient lying in the bed with no acute distress.  EYES: Pupils equal, round, reactive to light and accommodation. No scleral icterus.   HEENT: Head atraumatic, normocephalic. Oropharynx and nasopharynx clear.  NECK:  Supple, no jugular venous distention. No thyroid enlargement, no tenderness.  LUNGS: Normal breath sounds bilaterally, no wheezing, rales, rhonchi. No use of accessory muscles of respiration.  CARDIOVASCULAR: S1, S2 normal. No murmurs, rubs, or gallops.  ABDOMEN: Soft, nontender, nondistended. Bowel  sounds present. No organomegaly or mass.  EXTREMITIES: No cyanosis, clubbing or edema b/l.    NEUROLOGIC: Cranial nerves II through XII are intact. No focal Motor or sensory deficits b/l.   PSYCHIATRIC:  patient is alert and oriented x 3.  SKIN: No obvious rash, lesion, or ulcer.   LABORATORY PANEL:  CBC Recent Labs  Lab 03/25/20 0454  WBC 7.0  HGB 8.5*  HCT 30.0*  PLT 124*    Chemistries  Recent Labs  Lab 03/24/20 1111 03/24/20 1111 03/25/20 0454  NA 144   < > 142  K 3.2*   < > 3.2*  CL 100   < > 106  CO2 22   < > 25  GLUCOSE 130*   < > 91  BUN 8   < > 6  CREATININE 1.24*   < > 0.93  CALCIUM 9.1   < > 8.1*  MG 1.4*  --   --   AST 32  --   --   ALT 14  --   --   ALKPHOS 92  --   --   BILITOT 1.7*  --   --    < > = values in this interval not displayed.   Cardiac Enzymes No results for input(s): TROPONINI in the last 168 hours. RADIOLOGY:  CT ABDOMEN PELVIS W CONTRAST  Result Date: 03/24/2020 CLINICAL DATA:  Onset nausea and vomiting last night. EXAM: CT ABDOMEN AND PELVIS WITH CONTRAST TECHNIQUE: Multidetector CT imaging of the abdomen and pelvis was performed using the standard protocol following bolus administration of intravenous contrast. CONTRAST:  100 mL OMNIPAQUE IOHEXOL 300 MG/ML  SOLN COMPARISON:  CT abdomen and pelvis 02/11/2020. PET CT scan 08/23/2019. FINDINGS: Lower chest:  Lung bases are clear. No pleural or pericardial effusion. Hepatobiliary: No focal liver abnormality is seen. Status post cholecystectomy. No biliary dilatation. Fatty infiltration of the liver again seen. Pancreas: Unremarkable. No pancreatic ductal dilatation or surrounding inflammatory changes. Spleen: Normal in size without focal abnormality. Adrenals/Urinary Tract: Bilateral adrenal lesions are again seen. The larger lesion is on the left and measures 5.9 x 4.3 cm in the axial plane on image 30. Right adrenal nodule measures 1.7 x 1.3 cm on image 22. These lesions have been characterized  as adenomas on prior PET CT scan. The patient has multiple punctate nonobstructing renal stones in both kidneys. There is also scarring of both kidneys, more extensive on the left. Small cyst in the upper pole of the left kidney and a cyst in lower pole the right kidney are unchanged. Stranding is seen in left renal hilum and about the renal pelvis, improved since the most recent CT. Double-J ureteral stent on the left has been removed since the prior CT. The right ureter appears normal. Urinary bladder is unremarkable. Stomach/Bowel: Stomach is within normal limits. The patient is status post appendectomy. No evidence of bowel wall thickening, distention, or inflammatory changes. Mild sigmoid diverticulosis noted. Vascular/Lymphatic: No significant vascular findings are present. No enlarged abdominal or pelvic lymph nodes. Reproductive: Uterus and bilateral adnexa are unremarkable. Other: None. Musculoskeletal: Negative. IMPRESSION: Stranding about the left renal collecting system and pelvis appears improved compared to the most recent CT but could be infectious or inflammatory. Left double-J ureteral stent has been removed since the most recent exam. Multiple punctate nonobstructing bilateral renal stones. Negative for hydronephrosis. Fatty infiltration of the liver. Bilateral adrenal lesions compatible with adenomas as seen on prior exams. Mild diverticulosis without diverticulitis. Electronically Signed   By: Inge Rise M.D.   On: 03/24/2020 16:33   ASSESSMENT AND PLAN:  Katrina Page is a 39 y.o. female with medical history significant for History of nephrolithiasis, pulmonary embolism previously on Xarelto, depression, alcohol abuse who presents with persistent nausea and vomiting  Sepsis with AKI due to pre-renal azotemia present on admission secondary to possible gastroenteritis likely viral -improved -Patient has had complicated nephrolithiasis history in the past 2 months bult UA is  completely negative today. - Her CT abdomen and pelvis also showed improvement in left renal stranding and there is no obstructing stones. -hold further abxs -no fever -Continue IV normal saline fluid. -Pending blood cx negative so far  and urine culture pending -PRN antiemetic and pain control -IV PPI daily -7/4>>no vomiting today-nausea +  AKI Creatinine elevated to 1.24 from prior of 0.87 Continuous IV fluids.  Avoid nephrotoxic agent. Creat normal  Hypokalemia  Replete  Hypomagnesemia Replete  Adrenal mass This is being followed outpatient by urology.  Patient had endocrinology follow-up planned for metabolic evaluation but reportedly did not follow-up.  Will need further metabolic evaluation outpatient. There is mention of a eventual left adrenalectomy planned per urology documentation.  Anemia secondary to vitamin deficiency Continue folate and vitamin B12  Anxiety/depression Continue trazodone and venlafaxine  DVT prophylaxis:.Lovenox Code Status: Full Family Communication: Plan discussed with patient at bedside  disposition Plan: Home with at least 2 midnight stays  Consults called:  Admission status: inpatient   Remains inpatient appropriate because:IV treatments appropriate due to intensity of illness or inability to take PO   Dispo: The patient is from: Home  Anticipated d/c is to: Home  Anticipated d/c date is: 1-2 days  Patient currently is not medically stable  to d/c.         TOTAL TIME TAKING CARE OF THIS PATIENT: *25* minutes.  >50% time spent on counselling and coordination of care  Note: This dictation was prepared with Dragon dictation along with smaller phrase technology. Any transcriptional errors that result from this process are unintentional.  Fritzi Mandes M.D    [facility]   CC: Primary care physician; Patient, No Pcp PerPatient ID: [name], female   DOB: [birth year],  39 y.o.   MRN: [id number]

## 2020-03-26 DIAGNOSIS — R1110 Vomiting, unspecified: Secondary | ICD-10-CM

## 2020-03-26 DIAGNOSIS — R112 Nausea with vomiting, unspecified: Secondary | ICD-10-CM

## 2020-03-26 LAB — URINE CULTURE: Culture: <10000 — AB

## 2020-03-26 MED ORDER — ENSURE MAX PROTEIN PO LIQD
11.0000 [oz_av] | Freq: Two times a day (BID) | ORAL | Status: DC
  Administered 2020-03-26: 11 [oz_av] via ORAL
  Filled 2020-03-26: qty 330

## 2020-03-26 MED ORDER — KETOROLAC TROMETHAMINE 15 MG/ML IJ SOLN
15.0000 mg | Freq: Once | INTRAMUSCULAR | Status: AC
  Administered 2020-03-26: 15 mg via INTRAVENOUS
  Filled 2020-03-26: qty 1

## 2020-03-26 MED ORDER — METOCLOPRAMIDE HCL 5 MG/ML IJ SOLN
5.0000 mg | Freq: Four times a day (QID) | INTRAMUSCULAR | Status: DC | PRN
  Administered 2020-03-26 (×2): 5 mg via INTRAVENOUS
  Filled 2020-03-26 (×2): qty 2

## 2020-03-26 MED ORDER — SODIUM CHLORIDE 0.9 % IV SOLN: INTRAVENOUS | Status: DC

## 2020-03-26 MED ORDER — PANTOPRAZOLE SODIUM 40 MG PO TBEC
40.0000 mg | DELAYED_RELEASE_TABLET | Freq: Every day | ORAL | Status: DC
  Administered 2020-03-26 – 2020-03-27 (×2): 40 mg via ORAL
  Filled 2020-03-26 (×2): qty 1

## 2020-03-26 MED ORDER — NICOTINE 21 MG/24HR TD PT24
21.0000 mg | MEDICATED_PATCH | Freq: Every day | TRANSDERMAL | Status: DC
  Administered 2020-03-26 – 2020-03-27 (×2): 21 mg via TRANSDERMAL
  Filled 2020-03-26 (×2): qty 1

## 2020-03-26 NOTE — Evaluation (Signed)
Physical Therapy Evaluation Patient Details Name: Katrina Page MRN: [id number] DOB: 02-21-1981 Today's Date: 03/26/2020   History of Present Illness  Pt is a 39 y.o. female with medical history significant for  History of nephrolithiasis, pulmonary embolism previously on Xarelto, depression, alcohol abuse who presents with persistent nausea and vomiting. Work up positive for sepsis with AKI.    Clinical Impression  Pt alert, sitting EOB at start of session with emesis bag, reported back pain is 8.5/10. Pt stated at baseline she is independent for ADLs/IADLs, did endorse several falls, but believes this is related to her nausea/vomiting and poor intake. Denies falls prior to theses episodes.  The patient demonstrated bed mobility independently, sit <> stand with and without IV pole independently, and ambulated ~53ft in room. Distance limited due to pt nausea and back pain. Pt able to ambulate without IV pole but expressed more confidence while utilizing for single UE support. no LOB noted, decreased gait velocity. Pt repositioned up in chair, all needs in reach. The patient demonstrated and reported return to baseline level of functioning, no further acute PT needs indicated. PT to sign off. Please reconsult PT if pt status changes or acute needs are identified.   PT and pt did discuss potential benefit for outpatient physical therapy due to ongoing low back pain. Pt educated that she could call her PCP to discuss a referral if she wants to move forward with some therapy in the near future, verbalized understanding.      Follow Up Recommendations No PT follow up    Equipment Recommendations  None recommended by PT    Recommendations for Other Services       Precautions / Restrictions Precautions Precautions: None Restrictions Weight Bearing Restrictions: No      Mobility  Bed Mobility Overal bed mobility: Independent                Transfers Overall transfer level:  Independent Equipment used: None (IV pole)                Ambulation/Gait Ambulation/Gait assistance: Supervision Gait Distance (Feet): 35 Feet Assistive device: IV Pole;None       General Gait Details: Pt able to ambulate without IV pole but expressed more confidence while utilizing for single UE support. no LOB noted, decreased gait velocity.  Stairs            Wheelchair Mobility    Modified Rankin (Stroke Patients Only)       Balance Overall balance assessment: Needs assistance Sitting-balance support: Feet supported Sitting balance-Leahy Scale: Normal       Standing balance-Leahy Scale: Good                               Pertinent Vitals/Pain Pain Assessment: 0-10 Pain Score: 9  Pain Location: low back pain Pain Descriptors / Indicators: Aching Pain Intervention(s): Limited activity within patient's tolerance;Monitored during session;Repositioned;Patient requesting pain meds-RN notified    Home Living Family/patient expects to be discharged to:: Private residence Living Arrangements: Children Available Help at Discharge: Family;Available PRN/intermittently Type of Home: Mobile home Home Access: Stairs to enter   Entrance Stairs-Number of Steps: 1 + 1, pt stated she normally holds on to her daughter Home Layout: One level Home Equipment: None Additional Comments: Pt reported several falls prior to most recent hospital admission, stated she believes it was related to her nausea, vomiting, and concurrent weakness. Prior to this episode pt  denied falls.    Prior Function Level of Independence: Independent               Hand Dominance        Extremity/Trunk Assessment   Upper Extremity Assessment Upper Extremity Assessment: Overall WFL for tasks assessed    Lower Extremity Assessment Lower Extremity Assessment: Generalized weakness    Cervical / Trunk Assessment Cervical / Trunk Assessment: Normal  Communication    Communication: No difficulties  Cognition Arousal/Alertness: Awake/alert Behavior During Therapy: WFL for tasks assessed/performed Overall Cognitive Status: Within Functional Limits for tasks assessed                                        General Comments      Exercises Other Exercises Other Exercises: PT and pt discussed potential discharge options from a rehab standpoint; educated on potential benefit of outpatient therapy if back pain continues to persist, pt verbalized understanding.   Assessment/Plan    PT Assessment Patent does not need any further PT services  PT Problem List         PT Treatment Interventions      PT Goals (Current goals can be found in the Care Plan section)       Frequency     Barriers to discharge        Co-evaluation               AM-PAC PT "6 Clicks" Mobility  Outcome Measure Help needed turning from your back to your side while in a flat bed without using bedrails?: None Help needed moving from lying on your back to sitting on the side of a flat bed without using bedrails?: None Help needed moving to and from a bed to a chair (including a wheelchair)?: None Help needed standing up from a chair using your arms (e.g., wheelchair or bedside chair)?: None Help needed to walk in hospital room?: None Help needed climbing 3-5 steps with a railing? : A Little 6 Click Score: 23    End of Session   Activity Tolerance: Patient tolerated treatment well;Other (comment);Patient limited by pain (limited by nausea) Patient left: in chair;with call bell/phone within reach Nurse Communication: Mobility status PT Visit Diagnosis: Other abnormalities of gait and mobility (R26.89)    Time: 5361-4431 PT Time Calculation (min) (ACUTE ONLY): 14 min   Charges:   PT Evaluation $PT Eval Low Complexity: 1 Low         Lieutenant Diego PT, DPT 10:00 AM,03/26/20

## 2020-03-26 NOTE — Consult Note (Signed)
Katrina Lame, MD [facility]  [address], [place], [place] [address] Phone: [phone] Fax : [phone]  Consultation  Referring Provider:     Dr. Posey Pronto Primary Care Physician:  Patient, No Pcp Per Primary Gastroenterologist: Althia Forts         Reason for Consultation:     Nausea and vomiting  Date of Admission:  03/24/2020 Date of Consultation:  03/26/2020         HPI:   Katrina Page is a 39 y.o. female who comes into the hospital yesterday with intractable nausea and vomiting.  The patient reports that she has had nausea and vomiting for the last few months.  She states that she has lost 50 pounds since November.  She has been diagnosed with depression pulmonary embolism and alcohol abuse.  When asked the patient only reports that she drinks 2-3 times a week and only 3-4 shots.  She reports that her nausea will come on her even without eating and has woken her up in the middle the night.  She states that the reason she came to the hospital was because she had a significant increase in her nausea and she states that she filled up a garbage tell with vomitus.  The patient had a CT scan of the abdomen that showed nonobstructing renal stones with adrenal lesions compatible with adenomas.  The patient reports that she was able to eat a couple bites of cereal today. The patient's blood work showed a hemoglobin of 8.5 down from 10.6 yesterday and 9.9 in June of this year.  The patient had an elevated lactic acid on admission that has come down to normal.  She denies any overt abdominal pain and states that she had her gallbladder and appendix the past.  Past Medical History:  Diagnosis Date  . Adrenal nodule ([place]) 06/2019   left  . Anemia   . Bronchitis   . Depression   . History of kidney stones   . Migraines   . Nodule of right lung 06/2019  . Pulmonary emboli ([place]) 07/23/2019   bilateral  . Thrombocytopenia ([place])     Past Surgical History:  Procedure Laterality Date  .  APPENDECTOMY    . CHOLECYSTECTOMY    . CYSTOSCOPY W/ RETROGRADES Left 03/05/2020   Procedure: CYSTOSCOPY WITH RETROGRADE PYELOGRAM;  Surgeon: Hollice Espy, MD;  Location: [facility] ORS;  Service: Urology;  Laterality: Left;  . CYSTOSCOPY WITH STENT PLACEMENT Left 02/09/2020   Procedure: CYSTOSCOPY WITH STENT PLACEMENT;  Surgeon: Hollice Espy, MD;  Location: [facility] ORS;  Service: Urology;  Laterality: Left;  . CYSTOSCOPY/URETEROSCOPY/HOLMIUM LASER/STENT PLACEMENT Left 03/05/2020   Procedure: CYSTOSCOPY/URETEROSCOPY/HOLMIUM LASER/STENT EXCHANGE;  Surgeon: Hollice Espy, MD;  Location: [facility] ORS;  Service: Urology;  Laterality: Left;  . TUBAL LIGATION      Prior to Admission medications   Medication Sig Start Date End Date Taking? Authorizing Provider  albuterol (VENTOLIN HFA) 108 (90 Base) MCG/ACT inhaler Inhale 1-2 puffs into the lungs every 4 (four) hours as needed for wheezing or shortness of breath. 01/02/20  Yes Clapacs, Madie Reno, MD  folic acid (FOLVITE) 1 MG tablet Take 1 tablet (1 mg total) by mouth daily. 02/13/20  Yes Lorella Nimrod, MD  hydrOXYzine (ATARAX/VISTARIL) 50 MG tablet Take 1 tablet (50 mg total) by mouth 3 (three) times daily as needed for anxiety. 01/02/20  Yes Clapacs, Madie Reno, MD  ondansetron (ZOFRAN) 4 MG tablet Take 1 tablet (4 mg total) by mouth every 6 (six) hours  as needed for nausea. 02/12/20  Yes Lorella Nimrod, MD  oxybutynin (DITROPAN) 5 MG tablet Take 1 tablet (5 mg total) by mouth every 8 (eight) hours as needed for bladder spasms. 02/12/20  Yes Lorella Nimrod, MD  Oxycodone HCl 10 MG TABS Take 1 tablet (10 mg total) by mouth every 6 (six) hours as needed (moderate to severe pain). 03/05/20  Yes Hollice Espy, MD  traZODone (DESYREL) 100 MG tablet Take 2 tablets (200 mg total) by mouth at bedtime. 01/02/20  Yes Clapacs, Madie Reno, MD  venlafaxine XR (EFFEXOR-XR) 75 MG 24 hr capsule Take 1 capsule (75 mg total) by mouth daily with breakfast. 01/02/20  Yes Clapacs, Madie Reno, MD    vitamin B-12 1000 MCG tablet Take 1 tablet (1,000 mcg total) by mouth daily. 02/13/20  Yes Lorella Nimrod, MD    Family History  Problem Relation Age of Onset  . Hypertension Mother   . Diabetes Mother   . Stroke Mother   . Breast cancer Mother      Social History   Tobacco Use  . Smoking status: Current Every Day Smoker    Packs/day: 0.50    Years: 20.00    Pack years: 10.00    Types: Cigarettes  . Smokeless tobacco: Never Used  Vaping Use  . Vaping Use: Never used  Substance Use Topics  . Alcohol use: Yes    Alcohol/week: 2.0 - 3.0 standard drinks    Types: 2 - 3 Shots of liquor per week    Comment: 2-3 week.    . Drug use: No    Allergies as of 03/24/2020  . (No Known Allergies)    Review of Systems:    All systems reviewed and negative except where noted in HPI.   Physical Exam:  Vital signs in last 24 hours: Temp:  [98.1 F (36.7 C)-98.5 F (36.9 C)] 98.4 F (36.9 C) (07/05 1203) Pulse Rate:  [78-102] 98 (07/05 1205) Resp:  [15-20] 15 (07/05 1203) BP: (150-169)/(86-102) 158/91 (07/05 1205) SpO2:  [98 %-100 %] 100 % (07/05 1205) Weight:  [91.4 kg] 91.4 kg (07/05 1523) Last BM Date: 03/23/20 General:   Pleasant, cooperative in NAD Head:  Normocephalic and atraumatic. Eyes:   No icterus.   Conjunctiva pink. PERRLA. Ears:  Normal auditory acuity. Neck:  Supple; no masses or thyroidomegaly Lungs: Respirations even and unlabored. Lungs clear to auscultation bilaterally.   No wheezes, crackles, or rhonchi.  Heart:  Regular rate and rhythm;  Without murmur, clicks, rubs or gallops Abdomen:  Soft, nondistended, nontender. Normal bowel sounds. No appreciable masses or hepatomegaly.  No rebound or guarding.  Rectal:  Not performed. Msk:  Symmetrical without gross deformities.    Extremities:  Without edema, cyanosis or clubbing. Neurologic:  Alert and oriented x3;  grossly normal neurologically. Skin:  Intact without significant lesions or rashes. Cervical  Nodes:  No significant cervical adenopathy. Psych:  Alert and cooperative. Normal affect.  LAB RESULTS: Recent Labs    03/24/20 1111 03/25/20 0454  WBC 12.9* 7.0  HGB 10.6* 8.5*  HCT 36.0 30.0*  PLT 178 124*   BMET Recent Labs    03/24/20 1111 03/25/20 0454  NA 144 142  K 3.2* 3.2*  CL 100 106  CO2 22 25  GLUCOSE 130* 91  BUN 8 6  CREATININE 1.24* 0.93  CALCIUM 9.1 8.1*   LFT Recent Labs    03/24/20 1111  PROT 8.2*  ALBUMIN 4.2  AST 32  ALT 14  ALKPHOS  92  BILITOT 1.7*   PT/INR No results for input(s): LABPROT, INR in the last 72 hours.  STUDIES: CT ABDOMEN PELVIS W CONTRAST  Result Date: 03/24/2020 CLINICAL DATA:  Onset nausea and vomiting last night. EXAM: CT ABDOMEN AND PELVIS WITH CONTRAST TECHNIQUE: Multidetector CT imaging of the abdomen and pelvis was performed using the standard protocol following bolus administration of intravenous contrast. CONTRAST:  100 mL OMNIPAQUE IOHEXOL 300 MG/ML  SOLN COMPARISON:  CT abdomen and pelvis 02/11/2020. PET CT scan 08/23/2019. FINDINGS: Lower chest: Lung bases are clear. No pleural or pericardial effusion. Hepatobiliary: No focal liver abnormality is seen. Status post cholecystectomy. No biliary dilatation. Fatty infiltration of the liver again seen. Pancreas: Unremarkable. No pancreatic ductal dilatation or surrounding inflammatory changes. Spleen: Normal in size without focal abnormality. Adrenals/Urinary Tract: Bilateral adrenal lesions are again seen. The larger lesion is on the left and measures 5.9 x 4.3 cm in the axial plane on image 30. Right adrenal nodule measures 1.7 x 1.3 cm on image 22. These lesions have been characterized as adenomas on prior PET CT scan. The patient has multiple punctate nonobstructing renal stones in both kidneys. There is also scarring of both kidneys, more extensive on the left. Small cyst in the upper pole of the left kidney and a cyst in lower pole the right kidney are unchanged. Stranding is  seen in left renal hilum and about the renal pelvis, improved since the most recent CT. Double-J ureteral stent on the left has been removed since the prior CT. The right ureter appears normal. Urinary bladder is unremarkable. Stomach/Bowel: Stomach is within normal limits. The patient is status post appendectomy. No evidence of bowel wall thickening, distention, or inflammatory changes. Mild sigmoid diverticulosis noted. Vascular/Lymphatic: No significant vascular findings are present. No enlarged abdominal or pelvic lymph nodes. Reproductive: Uterus and bilateral adnexa are unremarkable. Other: None. Musculoskeletal: Negative. IMPRESSION: Stranding about the left renal collecting system and pelvis appears improved compared to the most recent CT but could be infectious or inflammatory. Left double-J ureteral stent has been removed since the most recent exam. Multiple punctate nonobstructing bilateral renal stones. Negative for hydronephrosis. Fatty infiltration of the liver. Bilateral adrenal lesions compatible with adenomas as seen on prior exams. Mild diverticulosis without diverticulitis. Electronically Signed   By: Inge Rise M.D.   On: 03/24/2020 16:33      Impression / Plan:   Assessment: Principal Problem:   Sepsis ([place]) Active Problems:   Severe recurrent major depression without psychotic features (HCC)   Hypokalemia due to excessive gastrointestinal loss of potassium   Hypomagnesemia   Left adrenal mass (HCC)   Intractable vomiting with nausea   AKI (acute kidney injury) ([place])   Anemia   Katrina Page is a 39 y.o. y/o female with many months of nausea and vomiting that has gotten worse which made her come to the emergency department.  The patient reports that her nausea vomiting can be with any food she eats and can even have been while at work just standing there without eating or drinking.  The patient is status post cholecystectomy and denies any heartburn symptoms.  The  differential for this is gastritis, peptic ulcer disease versus reflux versus a central versus functional cause of her nausea vomiting.  Plan:  The patient has been told that since she has had a 50 pound weight loss and continued nausea vomiting she will be set up for a upper endoscopy for tomorrow.  The patient will be kept  n.p.o. after midnight.  The patient has been explained the plan agrees with it.  Thank you for involving me in the care of this patient.      LOS: 2 days   Katrina Lame, MD  03/26/2020, 3:45 PM Pager [phone] 7am-5pm  Check AMION for 5pm -7am coverage and on weekends   Note: This dictation was prepared with Dragon dictation along with smaller phrase technology. Any transcriptional errors that result from this process are unintentional.

## 2020-03-26 NOTE — Progress Notes (Signed)
[facility] [place] NAME: Katrina Page    MR#:  [id number]  DATE OF BIRTH:  12-01-1980  SUBJECTIVE:  Came in with vomiting started Friday evening. BM on Saturday No diarrhea Feels weak chronic pain  tolerating po fluids but dry heaving.  NO vomiting  REVIEW OF SYSTEMS:   Review of Systems  Constitutional: Negative for chills, fever and weight loss.  HENT: Negative for ear discharge, ear pain and nosebleeds.   Eyes: Negative for blurred vision, pain and discharge.  Respiratory: Negative for sputum production, shortness of breath, wheezing and stridor.   Cardiovascular: Negative for chest pain, palpitations, orthopnea and PND.  Gastrointestinal: Positive for nausea. Negative for abdominal pain, diarrhea and vomiting.  Genitourinary: Negative for frequency and urgency.  Musculoskeletal: Positive for back pain and joint pain.  Neurological: Positive for weakness. Negative for sensory change, speech change and focal weakness.  Psychiatric/Behavioral: Negative for depression and hallucinations. The patient is not nervous/anxious.    Tolerating Diet:very little--no appetite Tolerating PT: pending  DRUG ALLERGIES:  No Known Allergies  VITALS:  Blood pressure (!) 169/91, pulse 82, temperature 98.5 F (36.9 C), temperature source Oral, resp. rate 16, height 5\' 9"  (1.753 m), weight 91 kg, SpO2 98 %.  PHYSICAL EXAMINATION:   Physical Exam  GENERAL:  39 y.o.-year-old patient lying in the bed with no acute distress.  EYES: Pupils equal, round, reactive to light and accommodation. No scleral icterus.   HEENT: Head atraumatic, normocephalic. Oropharynx and nasopharynx clear.  NECK:  Supple, no jugular venous distention. No thyroid enlargement, no tenderness.  LUNGS: Normal breath sounds bilaterally, no wheezing, rales, rhonchi. No use of accessory muscles of respiration.  CARDIOVASCULAR: S1, S2 normal. No murmurs, rubs, or gallops.  ABDOMEN:  Soft, nontender, nondistended. Bowel sounds present. No organomegaly or mass.  EXTREMITIES: No cyanosis, clubbing or edema b/l.    NEUROLOGIC: Cranial nerves II through XII are intact. No focal Motor or sensory deficits b/l.   PSYCHIATRIC:  patient is alert and oriented x 3.  SKIN: No obvious rash, lesion, or ulcer.   LABORATORY PANEL:  CBC Recent Labs  Lab 03/25/20 0454  WBC 7.0  HGB 8.5*  HCT 30.0*  PLT 124*    Chemistries  Recent Labs  Lab 03/24/20 1111 03/24/20 1111 03/25/20 0454  NA 144   < > 142  K 3.2*   < > 3.2*  CL 100   < > 106  CO2 22   < > 25  GLUCOSE 130*   < > 91  BUN 8   < > 6  CREATININE 1.24*   < > 0.93  CALCIUM 9.1   < > 8.1*  MG 1.4*  --   --   AST 32  --   --   ALT 14  --   --   ALKPHOS 92  --   --   BILITOT 1.7*  --   --    < > = values in this interval not displayed.   Cardiac Enzymes No results for input(s): TROPONINI in the last 168 hours. RADIOLOGY:  CT ABDOMEN PELVIS W CONTRAST  Result Date: 03/24/2020 CLINICAL DATA:  Onset nausea and vomiting last night. EXAM: CT ABDOMEN AND PELVIS WITH CONTRAST TECHNIQUE: Multidetector CT imaging of the abdomen and pelvis was performed using the standard protocol following bolus administration of intravenous contrast. CONTRAST:  100 mL OMNIPAQUE IOHEXOL 300 MG/ML  SOLN COMPARISON:  CT abdomen and pelvis 02/11/2020. PET  CT scan 08/23/2019. FINDINGS: Lower chest: Lung bases are clear. No pleural or pericardial effusion. Hepatobiliary: No focal liver abnormality is seen. Status post cholecystectomy. No biliary dilatation. Fatty infiltration of the liver again seen. Pancreas: Unremarkable. No pancreatic ductal dilatation or surrounding inflammatory changes. Spleen: Normal in size without focal abnormality. Adrenals/Urinary Tract: Bilateral adrenal lesions are again seen. The larger lesion is on the left and measures 5.9 x 4.3 cm in the axial plane on image 30. Right adrenal nodule measures 1.7 x 1.3 cm on image 22.  These lesions have been characterized as adenomas on prior PET CT scan. The patient has multiple punctate nonobstructing renal stones in both kidneys. There is also scarring of both kidneys, more extensive on the left. Small cyst in the upper pole of the left kidney and a cyst in lower pole the right kidney are unchanged. Stranding is seen in left renal hilum and about the renal pelvis, improved since the most recent CT. Double-J ureteral stent on the left has been removed since the prior CT. The right ureter appears normal. Urinary bladder is unremarkable. Stomach/Bowel: Stomach is within normal limits. The patient is status post appendectomy. No evidence of bowel wall thickening, distention, or inflammatory changes. Mild sigmoid diverticulosis noted. Vascular/Lymphatic: No significant vascular findings are present. No enlarged abdominal or pelvic lymph nodes. Reproductive: Uterus and bilateral adnexa are unremarkable. Other: None. Musculoskeletal: Negative. IMPRESSION: Stranding about the left renal collecting system and pelvis appears improved compared to the most recent CT but could be infectious or inflammatory. Left double-J ureteral stent has been removed since the most recent exam. Multiple punctate nonobstructing bilateral renal stones. Negative for hydronephrosis. Fatty infiltration of the liver. Bilateral adrenal lesions compatible with adenomas as seen on prior exams. Mild diverticulosis without diverticulitis. Electronically Signed   By: Inge Rise M.D.   On: 03/24/2020 16:33   ASSESSMENT AND PLAN:  Katrina Page is a 39 y.o. female with medical history significant for History of nephrolithiasis, pulmonary embolism previously on Xarelto, depression, alcohol abuse who presents with persistent nausea and vomiting  Sepsis with AKI due to pre-renal azotemia present on admission secondary to possible gastroenteritis likely viral -improved/resolved -Patient has had complicated nephrolithiasis  history in the past 2 months bult UA is completely negative today. - Her CT abdomen and pelvis also showed improvement in left renal stranding and there is no obstructing stones. -hold further abxs -no fever -received IV normal saline fluid. -Pending blood cx negative so far  and urine culture <100K insignificant growth -PRN antiemetic and pain control -IV PPI daily -7/4>>no vomiting today-nausea + -7/5>. Cont with dry heaves. H.Pylori serology sent. GI dr Allen Norris to see pt. CT abdomen-- no hepatobiliary issue, fatty infiltration  AKI Creatinine elevated to 1.24 from prior of 0.87 recieved IV fluids.  Avoid nephrotoxic agent. Creat normal Encouraged to keep fluids in  Hypokalemia  Repleted  Hypomagnesemia Repleted  Adrenal mass This is being followed outpatient by urology.  Patient had endocrinology follow-up planned for metabolic evaluation but reportedly did not follow-up.  Will need further metabolic evaluation outpatient. There is mention of a eventual left adrenalectomy planned per urology documentation.  Anemia secondary to vitamin deficiency Continue folate and vitamin B12  Anxiety/depression Continue trazodone and venlafaxine  DVT prophylaxis:.Lovenox Code Status: Full Family Communication: Plan discussed with patient at bedside  disposition Plan: Home with at least 2 midnight stays  Consults called:  Admission status: inpatient   Remains inpatient appropriate because:IV treatments appropriate due to intensity of  illness or inability to take PO GI consulted   Dispo: The patient is from: Home  Anticipated d/c is to: Home  Anticipated d/c date is: 1-2 days  Patient currently is not medically stable to d/c.         TOTAL TIME TAKING CARE OF THIS PATIENT: *25* minutes.  >50% time spent on counselling and coordination of care  Note: This dictation was prepared with Dragon dictation along with smaller phrase  technology. Any transcriptional errors that result from this process are unintentional.  Fritzi Mandes M.D    [facility]   CC: Primary care physician; Patient, No Pcp PerPatient ID: [name], female   DOB: [birth year], 39 y.o.   MRN: [id number]

## 2020-03-26 NOTE — Progress Notes (Signed)
Initial Nutrition Assessment  DOCUMENTATION CODES:   Not applicable  INTERVENTION:   Ensure Max protein supplement BID, each supplement provides 150kcal and 30g of protein.  NUTRITION DIAGNOSIS:   Inadequate oral intake related to acute illness as evidenced by per patient/family report.  GOAL:   Patient will meet greater than or equal to 90% of their needs  MONITOR:   PO intake, Supplement acceptance, Labs, Weight trends, Skin, I & O's  REASON FOR ASSESSMENT:   Malnutrition Screening Tool    ASSESSMENT:   39 y.o. female with medical history significant for nephrolithiasis, pulmonary embolism previously on Xarelto, depression and alcohol abuse who presents with persistent nausea and vomiting  Met with pt in room today. Pt reports poor appetite and oral intake for several days pta r/t nausea and vomiting. Pt reports continued nausea today; pt was unable to eat any of her lunch today but she did eat 50% of her breakfast. Pt is willing to drink chocolate supplements while in hospital. RD will order Ensure Max protein as this is low in fat. Per chart, pt down 22lbs(10%) over the past 7 months; this is not significant.    Medications reviewed and include: lovenox, folic acid, nicotine, protonix, B12  Labs reviewed: K 3.2(L) Hgb 8.5(L), Hct 30.0(L), MCH 22.9(L), MCHC 28.3(L)  NUTRITION - FOCUSED PHYSICAL EXAM:    Most Recent Value  Orbital Region No depletion  Upper Arm Region No depletion  Thoracic and Lumbar Region No depletion  Buccal Region No depletion  Temple Region No depletion  Clavicle Bone Region No depletion  Clavicle and Acromion Bone Region No depletion  Scapular Bone Region No depletion  Dorsal Hand No depletion  Patellar Region No depletion  Anterior Thigh Region No depletion  Posterior Calf Region No depletion  Edema (RD Assessment) None  Hair Reviewed  Eyes Reviewed  Mouth Reviewed  Skin Reviewed  Nails Reviewed     Diet Order:   Diet Order             Diet NPO time specified  Diet effective midnight           DIET SOFT Room service appropriate? Yes; Fluid consistency: Thin  Diet effective now                EDUCATION NEEDS:   Education needs have been addressed  Skin:  Skin Assessment: Reviewed RN Assessment  Last BM:  7/2  Height:   Ht Readings from Last 1 Encounters:  03/24/20 5' 9" (1.753 m)    Weight:   Wt Readings from Last 1 Encounters:  03/26/20 91.4 kg    Ideal Body Weight:  65.9 kg  BMI:  Body mass index is 29.74 kg/m.  Estimated Nutritional Needs:   Kcal:  2000-2300kcal/day  Protein:  100-115g/day  Fluid:  >2L/day  Koleen Distance MS, RD, LDN Please refer to [facility] for RD and/or RD on-call/weekend/after hours pager

## 2020-03-27 ENCOUNTER — Encounter: Payer: Self-pay | Admitting: Family Medicine

## 2020-03-27 ENCOUNTER — Encounter
Admission: EM | Disposition: A | Discharge status: Home / Self Care | Payer: Self-pay | Source: Home / Self Care | Attending: Internal Medicine

## 2020-03-27 ENCOUNTER — Inpatient Hospital Stay: Payer: Self-pay | Admitting: Anesthesiology

## 2020-03-27 ENCOUNTER — Other Ambulatory Visit: Payer: Self-pay

## 2020-03-27 DIAGNOSIS — R6510 Systemic inflammatory response syndrome (SIRS) of non-infectious origin without acute organ dysfunction: Secondary | ICD-10-CM

## 2020-03-27 DIAGNOSIS — R110 Nausea: Secondary | ICD-10-CM

## 2020-03-27 HISTORY — PX: ESOPHAGOGASTRODUODENOSCOPY (EGD) WITH PROPOFOL: SHX5813

## 2020-03-27 SURGERY — ESOPHAGOGASTRODUODENOSCOPY (EGD) WITH PROPOFOL
Anesthesia: General

## 2020-03-27 MED ORDER — PROPOFOL 10 MG/ML IV BOLUS
INTRAVENOUS | Status: DC | PRN
  Administered 2020-03-27: 50 mg via INTRAVENOUS

## 2020-03-27 MED ORDER — MORPHINE SULFATE (PF) 2 MG/ML IV SOLN
1.0000 mg | Freq: Three times a day (TID) | INTRAVENOUS | Status: DC | PRN
  Administered 2020-03-27: 1 mg via INTRAVENOUS

## 2020-03-27 MED ORDER — MORPHINE SULFATE (PF) 2 MG/ML IV SOLN
1.0000 mg | Freq: Four times a day (QID) | INTRAVENOUS | Status: DC | PRN
  Filled 2020-03-27: qty 1

## 2020-03-27 MED ORDER — FENTANYL CITRATE (PF) 100 MCG/2ML IJ SOLN
INTRAMUSCULAR | Status: AC
  Filled 2020-03-27: qty 2

## 2020-03-27 MED ORDER — PROMETHAZINE HCL 25 MG PO TABS: 12.5000 mg | ORAL_TABLET | Freq: Two times a day (BID) | ORAL | Status: DC

## 2020-03-27 MED ORDER — MIDAZOLAM HCL 2 MG/2ML IJ SOLN
INTRAMUSCULAR | Status: DC | PRN
  Administered 2020-03-27 (×2): 1 mg via INTRAVENOUS

## 2020-03-27 MED ORDER — MIDAZOLAM HCL 2 MG/2ML IJ SOLN
INTRAMUSCULAR | Status: AC
  Filled 2020-03-27: qty 2

## 2020-03-27 MED ORDER — MORPHINE SULFATE (PF) 2 MG/ML IV SOLN
2.0000 mg | Freq: Once | INTRAVENOUS | Status: AC
  Administered 2020-03-27: 2 mg via INTRAVENOUS
  Filled 2020-03-27: qty 1

## 2020-03-27 MED ORDER — FENTANYL CITRATE (PF) 100 MCG/2ML IJ SOLN
INTRAMUSCULAR | Status: DC | PRN
  Administered 2020-03-27 (×2): 50 ug via INTRAVENOUS

## 2020-03-27 MED ORDER — PROPOFOL 500 MG/50ML IV EMUL
INTRAVENOUS | Status: AC
  Filled 2020-03-27: qty 50

## 2020-03-27 MED ORDER — VENLAFAXINE HCL ER 75 MG PO CP24: 75.0000 mg | ORAL_CAPSULE | Freq: Every day | ORAL | 0 refills | Status: DC

## 2020-03-27 MED ORDER — PANTOPRAZOLE SODIUM 40 MG PO TBEC: 40.0000 mg | DELAYED_RELEASE_TABLET | Freq: Every day | ORAL | 0 refills | Status: DC

## 2020-03-27 MED ORDER — HYDROXYZINE HCL 50 MG PO TABS: 50.0000 mg | ORAL_TABLET | Freq: Three times a day (TID) | ORAL | 0 refills | Status: DC | PRN

## 2020-03-27 MED ORDER — PROMETHAZINE HCL 12.5 MG PO TABS: 12.5000 mg | ORAL_TABLET | Freq: Two times a day (BID) | ORAL | 0 refills | Status: DC

## 2020-03-27 NOTE — Transfer of Care (Signed)
Immediate Anesthesia Transfer of Care Note  Patient: MAJORIE SANTEE  Procedure(s) Performed: ESOPHAGOGASTRODUODENOSCOPY (EGD) WITH PROPOFOL (N/A )  Patient Location: PACU and Endoscopy Unit  Anesthesia Type:General  Level of Consciousness: awake, alert  and oriented  Airway & Oxygen Therapy: Patient Spontanous Breathing  Post-op Assessment: Report given to RN and Post -op Vital signs reviewed and stable  Post vital signs: Reviewed and stable  Last Vitals:  Vitals Value Taken Time  BP 124/80 03/27/20 1217  Temp 35.7 C 03/27/20 1218  Pulse 101 03/27/20 1219  Resp 13 03/27/20 1219  SpO2 93 % 03/27/20 1219  Vitals shown include unvalidated device data.  Last Pain:  Vitals:   03/27/20 1218  TempSrc: Temporal  PainSc: 0-No pain      Patients Stated Pain Goal: 0 (57/49/35 5217)  Complications: No complications documented.

## 2020-03-27 NOTE — Plan of Care (Signed)
The patient has been discharged. IV removed. Education has been completed with the teach back method utilized. No falls. Discharge medication prescriptions have been provided.  Problem: Education: Goal: Knowledge of General Education information will improve Description: Including pain rating scale, medication(s)/side effects and non-pharmacologic comfort measures Outcome: Completed/Met   Problem: Health Behavior/Discharge Planning: Goal: Ability to manage health-related needs will improve Outcome: Completed/Met   Problem: Clinical Measurements: Goal: Ability to maintain clinical measurements within normal limits will improve Outcome: Completed/Met Goal: Will remain free from infection Outcome: Completed/Met Goal: Diagnostic test results will improve Outcome: Completed/Met Goal: Respiratory complications will improve Outcome: Completed/Met Goal: Cardiovascular complication will be avoided Outcome: Completed/Met   Problem: Activity: Goal: Risk for activity intolerance will decrease Outcome: Completed/Met   Problem: Nutrition: Goal: Adequate nutrition will be maintained Outcome: Completed/Met   Problem: Elimination: Goal: Will not experience complications related to bowel motility Outcome: Completed/Met Goal: Will not experience complications related to urinary retention Outcome: Completed/Met   Problem: Elimination: Goal: Will not experience complications related to urinary retention Outcome: Completed/Met   Problem: Pain Managment: Goal: General experience of comfort will improve Outcome: Completed/Met   Problem: Safety: Goal: Ability to remain free from injury will improve Outcome: Completed/Met   

## 2020-03-27 NOTE — Care Management (Signed)
Patient to discharge home today.  Patient has previously received  Medications at [facility] .  Patient states that she did not follow through on submitting applications to [facility]  Or [facility] .   RNCM confirmed that patient [facility]  Has all medications in stock at time of discharge.  Prescriptions have been sent over to [facility] .  Patient to pick up after discharge.  Patient confirms that she will complete applications to both [facility]  And [facility] .

## 2020-03-27 NOTE — Op Note (Signed)
[facility] Gastroenterology Patient Name: Katrina Page Procedure Date: 03/27/2020 11:59 AM MRN: [id number] Account #: [id number] Date of Birth: 30-Sep-1980 Admit Type: Inpatient Age: 39 Room: [facility] ENDO ROOM 4 Gender: Female Note Status: Finalized Procedure:             Upper GI endoscopy Indications:           Nausea with vomiting Providers:             Lucilla Lame MD, MD Referring MD:          No Local Md, MD (Referring MD) Medicines:             Propofol per Anesthesia Complications:         No immediate complications. Procedure:             Pre-Anesthesia Assessment:                        - Prior to the procedure, a History and Physical was                         performed, and patient medications and allergies were                         reviewed. The patient's tolerance of previous                         anesthesia was also reviewed. The risks and benefits                         of the procedure and the sedation options and risks                         were discussed with the patient. All questions were                         answered, and informed consent was obtained. Prior                         Anticoagulants: The patient has taken no previous                         anticoagulant or antiplatelet agents. ASA Grade                         Assessment: II - A patient with mild systemic disease.                         After reviewing the risks and benefits, the patient                         was deemed in satisfactory condition to undergo the                         procedure.                        After obtaining informed consent, the endoscope was  passed under direct vision. Throughout the procedure,                         the patient's blood pressure, pulse, and oxygen                         saturations were monitored continuously. The Endoscope                         was introduced through the mouth, and advanced to  the                         second part of duodenum. The upper GI endoscopy was                         accomplished without difficulty. The patient tolerated                         the procedure well. Findings:      The esophagus was normal.      The stomach was normal.      The examined duodenum was normal. Impression:            - Normal esophagus.                        - Normal stomach.                        - Normal examined duodenum.                        - No specimens collected. Recommendation:        - Return patient to hospital ward for ongoing care.                        - Resume regular diet.                        - Continue present medications. Procedure Code(s):     --- Professional ---                        [id number], Esophagogastroduodenoscopy, flexible,                         transoral; diagnostic, including collection of                         specimen(s) by brushing or washing, when performed                         (separate procedure) Diagnosis Code(s):     --- Professional ---                        R11.2, Nausea with vomiting, unspecified CPT copyright 2019 American Medical Association. All rights reserved. The codes documented in this report are preliminary and upon coder review may  be revised to meet current compliance requirements. Lucilla Lame MD, MD 03/27/2020 12:14:50 PM This report has been signed electronically. Number of Addenda: 0 Note Initiated On: 03/27/2020 11:59 AM Estimated Blood  Loss:  Estimated blood loss: none.      [facility]

## 2020-03-27 NOTE — Discharge Summary (Signed)
[place] [facility] [place] NAME: Katrina Page    MR#:  [id number]  DATE OF BIRTH:  1980-10-04  DATE OF ADMISSION:  03/24/2020 ADMITTING PHYSICIAN: Orene Desanctis, DO  DATE OF DISCHARGE: 03/27/2020  PRIMARY CARE PHYSICIAN: Patient, No Pcp Per    ADMISSION DIAGNOSIS:  Abdominal pain, unspecified abdominal location [R10.9] Intractable vomiting with nausea [R11.2] Intractable vomiting with nausea, unspecified vomiting type [R11.2]  DISCHARGE DIAGNOSIS:  Intractable Vomiting--resolved Nausea Chronic pain syndrome with narcotic use (dependence)  SECONDARY DIAGNOSIS:   Past Medical History:  Diagnosis Date   Adrenal nodule ([place]) 06/2019   left   Anemia    Bronchitis    Depression    History of kidney stones    Migraines    Nodule of right lung 06/2019   Pulmonary emboli ([place]) 07/23/2019   bilateral   Thrombocytopenia [facility])     HOSPITAL COURSE:   Katrina Page a 39 y.o.femalewith medical history significant forHistory of nephrolithiasis, pulmonary embolism previously on Xarelto, depression, alcohol abuse who presents with persistent nausea and vomiting  SIRSwith AKI due to pre-renal azotemia present on admissionsecondary topossible gastroenteritis likely viral -Patient has had complicated nephrolithiasis history in the past 2 months bult UA is completely negative today. -Her CT abdomen and pelvis also showed improvement in left renal stranding and there is no obstructing stones. -no fever -received IV normal saline fluid. -Pending blood cx negative so far  and urine culture <100K insignificant growth -PRN antiemetic and pain control -poPPI daily -7/4>>no vomiting today-nausea + -7/5>. Cont with dry heaves. H.Pylori serology sent. GI dr Allen Norris to see pt. CT abdomen-- no hepatobiliary issue, fatty infiltration -7/6>> EGD Normal  Pt is asking for phenergan and narcotics for her chronic back pain  She is advised to stop  ETOH, smoking and try to eat small frequent meals. F/u GI as needed   AKI Creatinine elevated to 1.24 from prior of 0.87 recieved IV fluids. Avoid nephrotoxic agent. Creat normal 0,93 Encouraged to drink fluids  Hypokalemia  Repleted  Adrenal mass This is being followed outpatient by urology. Patient hadendocrinology follow-up planned for metabolic evaluation but reportedly did not follow-up. Will need further metabolic evaluation outpatient--advised to f/u Urology There is mention of aeventualleft adrenalectomy planned per urology documentation.  Anemia secondary to vitamin deficiency Continue folate and vitamin B12  Anxiety/major depression Continue trazodone, atarax & venlafaxine--needs to follow out pt psych  DVT prophylaxis:.Lovenox Code Status: Full Family Communication: Plan discussed with patient at bedside  disposition Plan: Home today Consults called: GI Admission status: inpatient Dispo: The patient is from:Home Anticipated d/c is TJ:QZES Anticipated d/c date PQ:ZRAQT Patient currently is  Medically best at baseline and stable to d/c.  CONSULTS OBTAINED:    DRUG ALLERGIES:  No Known Allergies  DISCHARGE MEDICATIONS:   Allergies as of 03/27/2020   No Known Allergies     Medication List    TAKE these medications   albuterol 108 (90 Base) MCG/ACT inhaler Commonly known as: VENTOLIN HFA Inhale 1-2 puffs into the lungs every 4 (four) hours as needed for wheezing or shortness of breath.   cyanocobalamin 1000 MCG tablet Take 1 tablet (1,000 mcg total) by mouth daily.   folic acid 1 MG tablet Commonly known as: FOLVITE Take 1 tablet (1 mg total) by mouth daily.   hydrOXYzine 50 MG tablet Commonly known as: ATARAX/VISTARIL Take 1 tablet (50 mg total) by mouth 3 (three) times daily as needed for anxiety.  ondansetron 4 MG tablet Commonly known as: ZOFRAN Take 1 tablet (4 mg total) by mouth every  6 (six) hours as needed for nausea.   oxybutynin 5 MG tablet Commonly known as: DITROPAN Take 1 tablet (5 mg total) by mouth every 8 (eight) hours as needed for bladder spasms.   Oxycodone HCl 10 MG Tabs Take 1 tablet (10 mg total) by mouth every 6 (six) hours as needed (moderate to severe pain).   pantoprazole 40 MG tablet Commonly known as: PROTONIX Take 1 tablet (40 mg total) by mouth daily.   promethazine 12.5 MG tablet Commonly known as: PHENERGAN Take 1 tablet (12.5 mg total) by mouth 2 (two) times daily.   traZODone 100 MG tablet Commonly known as: DESYREL Take 2 tablets (200 mg total) by mouth at bedtime.   venlafaxine XR 75 MG 24 hr capsule Commonly known as: EFFEXOR-XR Take 1 capsule (75 mg total) by mouth daily with breakfast.       If you experience worsening of your admission symptoms, develop shortness of breath, life threatening emergency, suicidal or homicidal thoughts you must seek medical attention immediately by calling 911 or calling your MD immediately  if symptoms less severe.  You Must read complete instructions/literature along with all the possible adverse reactions/side effects for all the Medicines you take and that have been prescribed to you. Take any new Medicines after you have completely understood and accept all the possible adverse reactions/side effects.   Please note  You were cared for by a hospitalist during your hospital stay. If you have any questions about your discharge medications or the care you received while you were in the hospital after you are discharged, you can call the unit and asked to speak with the hospitalist on call if the hospitalist that took care of you is not available. Once you are discharged, your primary care physician will handle any further medical issues. Please note that NO REFILLS for any discharge medications will be authorized once you are discharged, as it is imperative that you return to your primary care  physician (or establish a relationship with a primary care physician if you do not have one) for your aftercare needs so that they can reassess your need for medications and monitor your lab values. Today   SUBJECTIVE   Wants IV pain meds  VITAL SIGNS:  Blood pressure (!) 131/92, pulse 86, temperature (!) 96.3 F (35.7 C), temperature source Temporal, resp. rate 18, height 5\' 9"  (1.753 m), weight 90.7 kg, SpO2 100 %.  I/O:    Intake/Output Summary (Last 24 hours) at 03/27/2020 1332 Last data filed at 03/27/2020 0500 Gross per 24 hour  Intake 0 ml  Output 1050 ml  Net -1050 ml    PHYSICAL EXAMINATION:  GENERAL:  39 y.o.-year-old patient lying in the bed with no acute distress.  EYES: Pupils equal, round, reactive to light and accommodation. No scleral icterus.  HEENT: Head atraumatic, normocephalic. Oropharynx and nasopharynx clear.  NECK:  Supple, no jugular venous distention. No thyroid enlargement, no tenderness.  LUNGS: Normal breath sounds bilaterally, no wheezing, rales,rhonchi or crepitation. No use of accessory muscles of respiration.  CARDIOVASCULAR: S1, S2 normal. No murmurs, rubs, or gallops.  ABDOMEN: Soft, non-tender, non-distended. Bowel sounds present. No organomegaly or mass.  EXTREMITIES: No pedal edema, cyanosis, or clubbing.  NEUROLOGIC: Cranial nerves II through XII are intact. Muscle strength 5/5 in all extremities. Sensation intact. Gait not checked.  PSYCHIATRIC: The patient is alert and oriented x  3.  SKIN: No obvious rash, lesion, or ulcer.   DATA REVIEW:   CBC  Recent Labs  Lab 03/25/20 0454  WBC 7.0  HGB 8.5*  HCT 30.0*  PLT 124*    Chemistries  Recent Labs  Lab 03/24/20 1111 03/24/20 1111 03/25/20 0454  NA 144   < > 142  K 3.2*   < > 3.2*  CL 100   < > 106  CO2 22   < > 25  GLUCOSE 130*   < > 91  BUN 8   < > 6  CREATININE 1.24*   < > 0.93  CALCIUM 9.1   < > 8.1*  MG 1.4*  --   --   AST 32  --   --   ALT 14  --   --   ALKPHOS 92   --   --   BILITOT 1.7*  --   --    < > = values in this interval not displayed.    Microbiology Results   Recent Results (from the past 240 hour(s))  Urine Culture     Status: Abnormal   Collection Time: 03/24/20  3:25 PM   Specimen: Urine, Random  Result Value Ref Range Status   Specimen Description   Final    URINE, RANDOM Performed at [facility], [address], [place], [place] [address]    Special Requests   Final    NONE Performed at [facility], [address] [place], [place] [address]    Culture (A)  Final    <10,000 COLONIES/mL INSIGNIFICANT GROWTH Performed at [facility], [place] [address], [place], [place] [address]    Report Status 03/26/2020 FINAL  Final  SARS Coronavirus 2 by RT PCR (hospital order, performed in [facility] hospital lab) Nasopharyngeal Nasopharyngeal Swab     Status: None   Collection Time: 03/24/20  6:38 PM   Specimen: Nasopharyngeal Swab  Result Value Ref Range Status   SARS Coronavirus 2 NEGATIVE NEGATIVE Final    Comment: (NOTE) SARS-CoV-2 target nucleic acids are NOT DETECTED.  The SARS-CoV-2 RNA is generally detectable in upper and lower respiratory specimens during the acute phase of infection. The lowest concentration of SARS-CoV-2 viral copies this assay can detect is 250 copies / mL. A negative result does not preclude SARS-CoV-2 infection and should not be used as the sole basis for treatment or other patient management decisions.  A negative result may occur with improper specimen collection / handling, submission of specimen other than nasopharyngeal swab, presence of viral mutation(s) within the areas targeted by this assay, and inadequate number of viral copies (<250 copies / mL). A negative result must be combined with clinical observations, patient history, and epidemiological information.  Fact Sheet for Patients:   StrictlyIdeas.no  Fact Sheet for Healthcare  Providers: BankingDealers.co.za  This test is not yet approved or  cleared by the [place] FDA and has been authorized for detection and/or diagnosis of SARS-CoV-2 by FDA under an Emergency Use Authorization (EUA).  This EUA will remain in effect (meaning this test can be used) for the duration of the COVID-19 declaration under Section 564(b)(1) of the Act, 21 U.S.C. section 360bbb-3(b)(1), unless the authorization is terminated or revoked sooner.  Performed at [facility], [address] [place], [place] [address]   Blood Culture (routine x 2)     Status: None (Preliminary result)   Collection Time: 03/24/20  7:24 PM   Specimen: BLOOD  Result Value Ref Range Status   Specimen Description BLOOD BLOOD LEFT HAND  Final   Special Requests   Final    BOTTLES DRAWN AEROBIC AND ANAEROBIC Blood Culture results may not be optimal due to an excessive volume of blood received in culture bottles   Culture   Final    NO GROWTH 3 DAYS Performed at [facility], [address] [place], [place] [address]    Report Status PENDING  Incomplete  Blood Culture (routine x 2)     Status: None (Preliminary result)   Collection Time: 03/24/20  7:30 PM   Specimen: BLOOD  Result Value Ref Range Status   Specimen Description BLOOD BLOOD RIGHT HAND  Final   Special Requests   Final    BOTTLES DRAWN AEROBIC AND ANAEROBIC Blood Culture adequate volume   Culture   Final    NO GROWTH 3 DAYS Performed at [facility], [address], [place], [place] [address]    Report Status PENDING  Incomplete    RADIOLOGY:  No results found.   CODE STATUS:     Code Status Orders  (From admission, onward)         Start     Ordered   03/24/20 1901  Full code  Continuous        03/24/20 1901        Code Status History    Date Active Date Inactive Code Status Order ID Comments User Context   02/09/2020 0540 02/12/2020 2255 Full Code [id number]  Christel Mormon, MD ED   12/28/2019 1713 01/02/2020 1720 Full Code [id number]  Dixie Dials, MD Inpatient   12/25/2019 1622 12/28/2019 1708 Full Code [id number]  Duffy Bruce, MD ED   12/24/2019 0252 12/25/2019 1622 Full Code [id number]  Carrie Mew, MD ED   07/23/2019 1812 07/26/2019 1521 Full Code [id number]  Henreitta Leber, MD ED   11/29/2018 2316 12/02/2018 1600 Full Code [id number]  Demetrios Loll, MD Inpatient   Advance Care Planning Activity       TOTAL TIME TAKING CARE OF THIS PATIENT: *40* minutes.    Fritzi Mandes M.D  [facility]    CC: Primary care physician; Patient, No Pcp Per

## 2020-03-27 NOTE — Discharge Instructions (Signed)
Pt to f/u with her psychiatry appt She also needs to f/u with her PCP as outpt She is advised to stop smoking and drinking alcohol

## 2020-03-27 NOTE — Anesthesia Preprocedure Evaluation (Signed)
Anesthesia Evaluation  Patient identified by MRN, date of birth, ID band Patient awake    Reviewed: Allergy & Precautions, H&P , NPO status , Patient's Chart, lab work & pertinent test results  Airway Mallampati: II  TM Distance: >3 FB Neck ROM: full    Dental  (+) Chipped   Pulmonary shortness of breath and with exertion, neg COPD, Current Smoker and Patient abstained from smoking.,    Pulmonary exam normal breath sounds clear to auscultation       Cardiovascular (-) angina(-) Past MI and (-) Cardiac Stents Normal cardiovascular exam(-) dysrhythmias  Rhythm:regular Rate:Normal  H/o PE   Neuro/Psych  Headaches, PSYCHIATRIC DISORDERS Depression    GI/Hepatic negative GI ROS, Neg liver ROS, (+)       alcohol use,   Endo/Other  negative endocrine ROS  Renal/GU negative Renal ROSRenal stone  negative genitourinary   Musculoskeletal   Abdominal   Peds  Hematology  (+) Blood dyscrasia, anemia ,   Anesthesia Other Findings Past Medical History: 06/2019: Adrenal nodule ([place])     Comment:  left No date: Anemia No date: Bronchitis No date: Depression No date: History of kidney stones No date: Migraines 06/2019: Nodule of right lung 07/23/2019: Pulmonary emboli ([facility])     Comment:  bilateral No date: Thrombocytopenia ([place])  Past Surgical History: No date: APPENDECTOMY No date: CHOLECYSTECTOMY 03/05/2020: CYSTOSCOPY W/ RETROGRADES; Left     Comment:  Procedure: CYSTOSCOPY WITH RETROGRADE PYELOGRAM;                Surgeon: Hollice Espy, MD;  Location: [facility] ORS;                Service: Urology;  Laterality: Left; 02/09/2020: CYSTOSCOPY WITH STENT PLACEMENT; Left     Comment:  Procedure: CYSTOSCOPY WITH STENT PLACEMENT;  Surgeon:               Hollice Espy, MD;  Location: [facility] ORS;  Service:               Urology;  Laterality: Left; 03/05/2020: CYSTOSCOPY/URETEROSCOPY/HOLMIUM LASER/STENT PLACEMENT; Left      Comment:  Procedure: CYSTOSCOPY/URETEROSCOPY/HOLMIUM LASER/STENT               EXCHANGE;  Surgeon: Hollice Espy, MD;  Location: [facility]               ORS;  Service: Urology;  Laterality: Left; No date: TUBAL LIGATION  BMI    Body Mass Index: 29.53 kg/m      Reproductive/Obstetrics negative OB ROS                             Anesthesia Physical  Anesthesia Plan  ASA: III  Anesthesia Plan: General   Post-op Pain Management:    Induction:   PONV Risk Score and Plan: Propofol infusion and TIVA  Airway Management Planned:   Additional Equipment:   Intra-op Plan:   Post-operative Plan:   Informed Consent: I have reviewed the patients History and Physical, chart, labs and discussed the procedure including the risks, benefits and alternatives for the proposed anesthesia with the patient or authorized representative who has indicated his/her understanding and acceptance.     Dental Advisory Given  Plan Discussed with: Anesthesiologist, CRNA and Surgeon  Anesthesia Plan Comments:         Anesthesia Quick Evaluation

## 2020-03-27 NOTE — Progress Notes (Signed)
The patient had a normal upper endoscopy without any source of her nausea vomiting being found.  The patient also reports that she has been feeling dizzy when she sits up or lays down which may indicate a central cause of her nausea and vomiting.  There is no GI cause seen and the patient has had her gallbladder out in the past.  No further work-up from a GI point of view.  I will sign off.  Please call if any further GI concerns or questions.  We would like to thank you for the opportunity to participate in the care of Katrina Page.

## 2020-03-28 ENCOUNTER — Encounter: Payer: Self-pay | Admitting: Gastroenterology

## 2020-03-28 LAB — H PYLORI, IGM, IGG, IGA AB
H Pylori IgG: 0.49 Index Value (ref 0.00–0.79)
H. Pylogi, Iga Abs: <9 units (ref 0.0–8.9)
H. Pylogi, Igm Abs: <9 units (ref 0.0–8.9)

## 2020-03-28 NOTE — Anesthesia Postprocedure Evaluation (Signed)
Anesthesia Post Note  Patient: Katrina Page  Procedure(s) Performed: ESOPHAGOGASTRODUODENOSCOPY (EGD) WITH PROPOFOL (N/A )  Patient location during evaluation: PACU Anesthesia Type: General Level of consciousness: awake and alert Pain management: pain level controlled Vital Signs Assessment: post-procedure vital signs reviewed and stable Respiratory status: spontaneous breathing, nonlabored ventilation and respiratory function stable Cardiovascular status: blood pressure returned to baseline and stable Postop Assessment: no apparent nausea or vomiting Anesthetic complications: no   No complications documented.   Last Vitals:  Vitals:   03/27/20 1228 03/27/20 1338  BP: (!) 131/92 (!) 153/88  Pulse:  81  Resp:    Temp:  36.9 C  SpO2:  100%    Last Pain:  Vitals:   03/27/20 1455  TempSrc:   PainSc: [place]

## 2020-03-29 LAB — CULTURE, BLOOD (ROUTINE X 2)
Culture: NO GROWTH
Culture: NO GROWTH
Special Requests: ADEQUATE

## 2020-04-02 ENCOUNTER — Inpatient Hospital Stay
Admission: EM | Admit: 2020-04-02 | Discharge: 2020-04-04 | DRG: 392 | Disposition: A | Discharge status: Home / Self Care | Payer: Self-pay | Attending: Internal Medicine | Admitting: Internal Medicine

## 2020-04-02 ENCOUNTER — Other Ambulatory Visit: Payer: Self-pay

## 2020-04-02 DIAGNOSIS — E876 Hypokalemia: Secondary | ICD-10-CM | POA: Diagnosis present

## 2020-04-02 DIAGNOSIS — F1010 Alcohol abuse, uncomplicated: Secondary | ICD-10-CM | POA: Diagnosis present

## 2020-04-02 DIAGNOSIS — Z823 Family history of stroke: Secondary | ICD-10-CM

## 2020-04-02 DIAGNOSIS — K3184 Gastroparesis: Principal | ICD-10-CM | POA: Diagnosis present

## 2020-04-02 DIAGNOSIS — R112 Nausea with vomiting, unspecified: Secondary | ICD-10-CM | POA: Diagnosis present

## 2020-04-02 DIAGNOSIS — R1115 Cyclical vomiting syndrome unrelated to migraine: Secondary | ICD-10-CM | POA: Diagnosis present

## 2020-04-02 DIAGNOSIS — G8929 Other chronic pain: Secondary | ICD-10-CM | POA: Diagnosis present

## 2020-04-02 DIAGNOSIS — Z86711 Personal history of pulmonary embolism: Secondary | ICD-10-CM

## 2020-04-02 DIAGNOSIS — Z8249 Family history of ischemic heart disease and other diseases of the circulatory system: Secondary | ICD-10-CM

## 2020-04-02 DIAGNOSIS — F17210 Nicotine dependence, cigarettes, uncomplicated: Secondary | ICD-10-CM | POA: Diagnosis present

## 2020-04-02 DIAGNOSIS — Z20822 Contact with and (suspected) exposure to covid-19: Secondary | ICD-10-CM | POA: Diagnosis present

## 2020-04-02 DIAGNOSIS — R9431 Abnormal electrocardiogram [ECG] [EKG]: Secondary | ICD-10-CM | POA: Diagnosis present

## 2020-04-02 DIAGNOSIS — K2970 Gastritis, unspecified, without bleeding: Secondary | ICD-10-CM | POA: Diagnosis present

## 2020-04-02 DIAGNOSIS — R030 Elevated blood-pressure reading, without diagnosis of hypertension: Secondary | ICD-10-CM | POA: Diagnosis present

## 2020-04-02 DIAGNOSIS — Z720 Tobacco use: Secondary | ICD-10-CM | POA: Diagnosis present

## 2020-04-02 DIAGNOSIS — I4892 Unspecified atrial flutter: Secondary | ICD-10-CM | POA: Diagnosis present

## 2020-04-02 DIAGNOSIS — Z833 Family history of diabetes mellitus: Secondary | ICD-10-CM

## 2020-04-02 DIAGNOSIS — Z9049 Acquired absence of other specified parts of digestive tract: Secondary | ICD-10-CM

## 2020-04-02 DIAGNOSIS — Z79899 Other long term (current) drug therapy: Secondary | ICD-10-CM

## 2020-04-02 DIAGNOSIS — K529 Noninfective gastroenteritis and colitis, unspecified: Secondary | ICD-10-CM | POA: Diagnosis present

## 2020-04-02 DIAGNOSIS — N200 Calculus of kidney: Secondary | ICD-10-CM | POA: Diagnosis present

## 2020-04-02 DIAGNOSIS — Z803 Family history of malignant neoplasm of breast: Secondary | ICD-10-CM

## 2020-04-02 DIAGNOSIS — Z87442 Personal history of urinary calculi: Secondary | ICD-10-CM

## 2020-04-02 DIAGNOSIS — F329 Major depressive disorder, single episode, unspecified: Secondary | ICD-10-CM | POA: Diagnosis present

## 2020-04-02 DIAGNOSIS — E860 Dehydration: Secondary | ICD-10-CM | POA: Diagnosis present

## 2020-04-02 LAB — COMPREHENSIVE METABOLIC PANEL
ALT: 10 U/L (ref 0–44)
AST: 21 U/L (ref 15–41)
Albumin: 4.3 g/dL (ref 3.5–5.0)
Alkaline Phosphatase: 53 U/L (ref 38–126)
Anion gap: 10 (ref 5–15)
BUN: 10 mg/dL (ref 6–20)
CO2: 24 mmol/L (ref 22–32)
Calcium: 9.6 mg/dL (ref 8.9–10.3)
Chloride: 106 mmol/L (ref 98–111)
Creatinine, Ser: 1 mg/dL (ref 0.44–1.00)
GFR calc Af Amer: >60 mL/min (ref >60)
GFR calc non Af Amer: >60 mL/min (ref >60)
Glucose, Bld: 145 mg/dL — ABNORMAL HIGH (ref 70–99)
Potassium: 3.1 mmol/L — ABNORMAL LOW (ref 3.5–5.1)
Sodium: 140 mmol/L (ref 135–145)
Total Bilirubin: 1.1 mg/dL (ref 0.3–1.2)
Total Protein: 7.9 g/dL (ref 6.5–8.1)

## 2020-04-02 LAB — PREGNANCY, URINE: Preg Test, Ur: NEGATIVE

## 2020-04-02 LAB — SARS CORONAVIRUS 2 BY RT PCR (HOSPITAL ORDER, PERFORMED IN ~~LOC~~ HOSPITAL LAB): SARS Coronavirus 2: NEGATIVE

## 2020-04-02 LAB — URINALYSIS, COMPLETE (UACMP) WITH MICROSCOPIC
Bacteria, UA: NONE SEEN
Bilirubin Urine: NEGATIVE
Glucose, UA: NEGATIVE mg/dL
Hgb urine dipstick: NEGATIVE
Ketones, ur: 5 mg/dL — AB
Leukocytes,Ua: NEGATIVE
Nitrite: NEGATIVE
Protein, ur: NEGATIVE mg/dL
Specific Gravity, Urine: 1.015 (ref 1.005–1.030)
pH: 7 (ref 5.0–8.0)

## 2020-04-02 LAB — CBC
HCT: 33.8 % — ABNORMAL LOW (ref 36.0–46.0)
Hemoglobin: 10.1 g/dL — ABNORMAL LOW (ref 12.0–15.0)
MCH: 23.5 pg — ABNORMAL LOW (ref 26.0–34.0)
MCHC: 29.9 g/dL — ABNORMAL LOW (ref 30.0–36.0)
MCV: 78.8 fL — ABNORMAL LOW (ref 80.0–100.0)
Platelets: 298 10*3/uL (ref 150–400)
RBC: 4.29 MIL/uL (ref 3.87–5.11)
RDW: 24.1 % — ABNORMAL HIGH (ref 11.5–15.5)
WBC: 9.5 10*3/uL (ref 4.0–10.5)
nRBC: 0 % (ref 0.0–0.2)

## 2020-04-02 LAB — LIPASE, BLOOD: Lipase: 42 U/L (ref 11–51)

## 2020-04-02 MED ORDER — NICOTINE 14 MG/24HR TD PT24
14.0000 mg | MEDICATED_PATCH | Freq: Every day | TRANSDERMAL | Status: DC
  Administered 2020-04-02 – 2020-04-04 (×3): 14 mg via TRANSDERMAL
  Filled 2020-04-02 (×3): qty 1

## 2020-04-02 MED ORDER — HALOPERIDOL LACTATE 5 MG/ML IJ SOLN
2.5000 mg | Freq: Once | INTRAMUSCULAR | Status: AC
  Administered 2020-04-02: 2.5 mg via INTRAVENOUS
  Filled 2020-04-02: qty 1

## 2020-04-02 MED ORDER — MAGNESIUM SULFATE 2 GM/50ML IV SOLN
2.0000 g | Freq: Once | INTRAVENOUS | Status: AC
  Administered 2020-04-02: 2 g via INTRAVENOUS
  Filled 2020-04-02: qty 50

## 2020-04-02 MED ORDER — NICOTINE 14 MG/24HR TD PT24: 14.0000 mg | MEDICATED_PATCH | Freq: Every day | TRANSDERMAL | Status: DC

## 2020-04-02 MED ORDER — PROCHLORPERAZINE 25 MG RE SUPP
25.0000 mg | Freq: Two times a day (BID) | RECTAL | Status: DC | PRN
  Administered 2020-04-02: 25 mg via RECTAL
  Filled 2020-04-02 (×3): qty 1

## 2020-04-02 MED ORDER — VITAMIN B-12 1000 MCG PO TABS
1000.0000 ug | ORAL_TABLET | Freq: Every day | ORAL | Status: DC
  Administered 2020-04-03 – 2020-04-04 (×2): 1000 ug via ORAL
  Filled 2020-04-02 (×2): qty 1

## 2020-04-02 MED ORDER — PANTOPRAZOLE SODIUM 40 MG IV SOLR
40.0000 mg | INTRAVENOUS | Status: DC
  Administered 2020-04-02 – 2020-04-03 (×2): 40 mg via INTRAVENOUS
  Filled 2020-04-02 (×2): qty 40

## 2020-04-02 MED ORDER — SODIUM CHLORIDE 0.9% FLUSH
3.0000 mL | Freq: Two times a day (BID) | INTRAVENOUS | Status: DC
  Administered 2020-04-03 – 2020-04-04 (×2): 3 mL via INTRAVENOUS

## 2020-04-02 MED ORDER — SODIUM CHLORIDE 0.9 % IV BOLUS
1000.0000 mL | Freq: Once | INTRAVENOUS | Status: AC
  Administered 2020-04-02: 1000 mL via INTRAVENOUS

## 2020-04-02 MED ORDER — MORPHINE SULFATE (PF) 2 MG/ML IV SOLN
2.0000 mg | Freq: Once | INTRAVENOUS | Status: AC
  Administered 2020-04-02: 2 mg via INTRAVENOUS
  Filled 2020-04-02: qty 1

## 2020-04-02 MED ORDER — OXYBUTYNIN CHLORIDE 5 MG PO TABS: 5.0000 mg | ORAL_TABLET | Freq: Three times a day (TID) | ORAL | Status: DC | PRN

## 2020-04-02 MED ORDER — PROMETHAZINE HCL 25 MG/ML IJ SOLN
12.5000 mg | Freq: Once | INTRAMUSCULAR | Status: AC
  Administered 2020-04-02: 12.5 mg via INTRAMUSCULAR

## 2020-04-02 MED ORDER — ONDANSETRON HCL 4 MG/2ML IJ SOLN
4.0000 mg | Freq: Once | INTRAMUSCULAR | Status: AC | PRN
  Administered 2020-04-02: 4 mg via INTRAVENOUS
  Filled 2020-04-02: qty 2

## 2020-04-02 MED ORDER — HALOPERIDOL LACTATE 5 MG/ML IJ SOLN
2.5000 mg | Freq: Once | INTRAMUSCULAR | Status: DC
  Filled 2020-04-02: qty 1

## 2020-04-02 MED ORDER — POTASSIUM CHLORIDE 10 MEQ/100ML IV SOLN
10.0000 meq | Freq: Once | INTRAVENOUS | Status: AC
  Administered 2020-04-02: 10 meq via INTRAVENOUS
  Filled 2020-04-02: qty 100

## 2020-04-02 MED ORDER — FE FUMARATE-B12-VIT C-FA-IFC PO CAPS
1.0000 | ORAL_CAPSULE | Freq: Every day | ORAL | Status: DC
  Administered 2020-04-03 – 2020-04-04 (×2): 1 via ORAL
  Filled 2020-04-02 (×3): qty 1

## 2020-04-02 MED ORDER — VENLAFAXINE HCL ER 75 MG PO CP24
75.0000 mg | ORAL_CAPSULE | Freq: Every day | ORAL | Status: DC
  Administered 2020-04-03 – 2020-04-04 (×2): 75 mg via ORAL
  Filled 2020-04-02 (×2): qty 1

## 2020-04-02 MED ORDER — SODIUM CHLORIDE 0.9% FLUSH: 3.0000 mL | Freq: Once | INTRAVENOUS | Status: DC

## 2020-04-02 MED ORDER — OXYCODONE HCL 5 MG PO TABS
5.0000 mg | ORAL_TABLET | Freq: Four times a day (QID) | ORAL | Status: DC | PRN
  Administered 2020-04-03 – 2020-04-04 (×3): 5 mg via ORAL
  Filled 2020-04-02 (×3): qty 1

## 2020-04-02 MED ORDER — HYDROMORPHONE HCL 1 MG/ML IJ SOLN
1.0000 mg | Freq: Once | INTRAMUSCULAR | Status: AC
  Administered 2020-04-02: 1 mg via INTRAVENOUS
  Filled 2020-04-02: qty 1

## 2020-04-02 MED ORDER — DIPHENHYDRAMINE HCL 50 MG/ML IJ SOLN
25.0000 mg | Freq: Once | INTRAMUSCULAR | Status: AC
  Administered 2020-04-02: 25 mg via INTRAVENOUS
  Filled 2020-04-02: qty 1

## 2020-04-02 MED ORDER — LORAZEPAM 2 MG/ML IJ SOLN
1.0000 mg | Freq: Once | INTRAMUSCULAR | Status: AC
  Administered 2020-04-02: 1 mg via INTRAVENOUS
  Filled 2020-04-02: qty 1

## 2020-04-02 MED ORDER — FOLIC ACID 1 MG PO TABS
1.0000 mg | ORAL_TABLET | Freq: Every day | ORAL | Status: DC
  Administered 2020-04-03 – 2020-04-04 (×2): 1 mg via ORAL
  Filled 2020-04-02 (×2): qty 1

## 2020-04-02 MED ORDER — HYDRALAZINE HCL 20 MG/ML IJ SOLN
10.0000 mg | Freq: Four times a day (QID) | INTRAMUSCULAR | Status: DC | PRN
  Administered 2020-04-03: 10 mg via INTRAVENOUS
  Filled 2020-04-02: qty 1

## 2020-04-02 MED ORDER — HYDROXYZINE HCL 25 MG PO TABS
50.0000 mg | ORAL_TABLET | Freq: Three times a day (TID) | ORAL | Status: DC | PRN
  Administered 2020-04-02 – 2020-04-03 (×2): 50 mg via ORAL
  Filled 2020-04-02 (×2): qty 2

## 2020-04-02 MED ORDER — POTASSIUM CHLORIDE IN NACL 40-0.9 MEQ/L-% IV SOLN
INTRAVENOUS | Status: DC
  Filled 2020-04-02 (×6): qty 1000

## 2020-04-02 MED ORDER — ENOXAPARIN SODIUM 40 MG/0.4ML ~~LOC~~ SOLN: 40.0000 mg | SUBCUTANEOUS | Status: DC

## 2020-04-02 MED ORDER — HYDROMORPHONE HCL 1 MG/ML IJ SOLN
0.5000 mg | Freq: Once | INTRAMUSCULAR | Status: AC
  Administered 2020-04-02: 0.5 mg via INTRAVENOUS
  Filled 2020-04-02: qty 1

## 2020-04-02 MED ORDER — NICOTINE 7 MG/24HR TD PT24
7.0000 mg | MEDICATED_PATCH | Freq: Every day | TRANSDERMAL | Status: DC
  Filled 2020-04-02: qty 1

## 2020-04-02 NOTE — ED Triage Notes (Signed)
Pt c/o back and abd pain with N/V since last night, states similar sx last week . Pt is actively vomiting in triage.

## 2020-04-02 NOTE — ED Notes (Signed)
Vomiting green bilious emesis in triage.  Refused Zofran, stating that it does not help, only makes things worse.

## 2020-04-02 NOTE — Progress Notes (Signed)
Patient requested an alternate route for nausea medication; from suppository to IV.  RN informed Rufina Falco, NP.

## 2020-04-02 NOTE — ED Notes (Signed)
Pt hitting call light asking for more medication

## 2020-04-02 NOTE — ED Notes (Signed)
Haldol held after discussing with dr Ellender Hose r/t QTC

## 2020-04-02 NOTE — ED Notes (Addendum)
Pt calling out for pain/nausea.  Informed pt need to wait for MD to order.

## 2020-04-02 NOTE — ED Notes (Signed)
Pt on cardiac monitor NSR.

## 2020-04-02 NOTE — H&P (Addendum)
History and Physical    Katrina Page WSF:[id number] DOB: 07-31-1981 DOA: 04/02/2020  PCP: Patient, No Pcp Per   Patient coming from: Home  I have personally briefly reviewed patient's old medical records in [place]  Chief Complaint: Nausea/vomiting  HPI: Katrina Page is a 39 y.o. female  with medical history significant for nephrolithiasis, pulmonary embolism previously on Xarelto, depression, alcohol abuse who presents with persistent nausea and vomiting. Patient was recently discharged from the hospital about 3 days prior to this admission for same and had an upper endoscopy which did not show any acute findings. She returns today with complaints of intractable nausea and vomiting and inability to keep any food down.  She denies any recent alcohol use and denies NSAID use.  She denies having any abdominal pain, no fever, no chills, no urinary symptoms, no dizziness, no lightheadedness or any changes in her bowel habits. She continues to have emesis in the ER despite receiving antiemetics Labs reveal potassium of 3.1 Vital signs are within normal limits EKG reviewed by me shows sinus rhythm with a prolonged QT   ED Course:  Patient is a 39 year old female recently discharged from the hospital for refractory nausea and vomiting who presents 3 days following hospital discharge with recurrence of her symptoms.  She continues to have emesis despite antiemetics, and will be referred to observation status.  Review of Systems: As per HPI otherwise 10 point review of systems negative.    Past Medical History:  Diagnosis Date  . Adrenal nodule ([place]) 06/2019   left  . Anemia   . Bronchitis   . Depression   . History of kidney stones   . Migraines   . Nodule of right lung 06/2019  . Pulmonary emboli ([place]) 07/23/2019   bilateral  . Thrombocytopenia ([place])     Past Surgical History:  Procedure Laterality Date  . APPENDECTOMY    . CHOLECYSTECTOMY    . CYSTOSCOPY W/  RETROGRADES Left 03/05/2020   Procedure: CYSTOSCOPY WITH RETROGRADE PYELOGRAM;  Surgeon: Hollice Espy, MD;  Location: [facility];  Service: Urology;  Laterality: Left;  . CYSTOSCOPY WITH STENT PLACEMENT Left 02/09/2020   Procedure: CYSTOSCOPY WITH STENT PLACEMENT;  Surgeon: Hollice Espy, MD;  Location: [facility];  Service: Urology;  Laterality: Left;  . CYSTOSCOPY/URETEROSCOPY/HOLMIUM LASER/STENT PLACEMENT Left 03/05/2020   Procedure: CYSTOSCOPY/URETEROSCOPY/HOLMIUM LASER/STENT EXCHANGE;  Surgeon: Hollice Espy, MD;  Location: [facility];  Service: Urology;  Laterality: Left;  . ESOPHAGOGASTRODUODENOSCOPY (EGD) WITH PROPOFOL N/A 03/27/2020   Procedure: ESOPHAGOGASTRODUODENOSCOPY (EGD) WITH PROPOFOL;  Surgeon: Lucilla Lame, MD;  Location: [facility];  Service: Endoscopy;  Laterality: N/A;  . TUBAL LIGATION       reports that she has been smoking cigarettes. She has a 5.00 pack-year smoking history. She has never used smokeless tobacco. She reports current alcohol use of about 6.0 standard drinks of alcohol per week. She reports that she does not use drugs.  No Known Allergies  Family History  Problem Relation Age of Onset  . Hypertension Mother   . Diabetes Mother   . Stroke Mother   . Breast cancer Mother      Prior to Admission medications   Medication Sig Start Date End Date Taking? Authorizing Provider  albuterol (VENTOLIN HFA) 108 (90 Base) MCG/ACT inhaler Inhale 1-2 puffs into the lungs every 4 (four) hours as needed for wheezing or shortness of breath. 01/02/20   Clapacs, Madie Reno, MD  folic acid (FOLVITE) 1 MG tablet Take 1 tablet (  1 mg total) by mouth daily. 02/13/20   Lorella Nimrod, MD  hydrOXYzine (ATARAX/VISTARIL) 50 MG tablet Take 1 tablet (50 mg total) by mouth 3 (three) times daily as needed for anxiety. 03/27/20   Fritzi Mandes, MD  ondansetron (ZOFRAN) 4 MG tablet Take 1 tablet (4 mg total) by mouth every 6 (six) hours as needed for nausea. 02/12/20   Lorella Nimrod, MD    oxybutynin (DITROPAN) 5 MG tablet Take 1 tablet (5 mg total) by mouth every 8 (eight) hours as needed for bladder spasms. 02/12/20   Lorella Nimrod, MD  Oxycodone HCl 10 MG TABS Take 1 tablet (10 mg total) by mouth every 6 (six) hours as needed (moderate to severe pain). 03/05/20   Hollice Espy, MD  pantoprazole (PROTONIX) 40 MG tablet Take 1 tablet (40 mg total) by mouth daily. 03/27/20   Fritzi Mandes, MD  promethazine (PHENERGAN) 12.5 MG tablet Take 1 tablet (12.5 mg total) by mouth 2 (two) times daily. 03/27/20   Fritzi Mandes, MD  traZODone (DESYREL) 100 MG tablet Take 2 tablets (200 mg total) by mouth at bedtime. 01/02/20   Clapacs, Madie Reno, MD  venlafaxine XR (EFFEXOR-XR) 75 MG 24 hr capsule Take 1 capsule (75 mg total) by mouth daily with breakfast. 03/27/20   Fritzi Mandes, MD  vitamin B-12 1000 MCG tablet Take 1 tablet (1,000 mcg total) by mouth daily. 02/13/20   Lorella Nimrod, MD    Physical Exam: Vitals:   04/02/20 0831  BP: (!) 164/94  Pulse: 68  Resp: 18  TempSrc: Oral  SpO2: 100%  Weight: 90.7 kg  Height: 5\' 9"  (1.753 m)     Vitals:   04/02/20 0831  BP: (!) 164/94  Pulse: 68  Resp: 18  TempSrc: Oral  SpO2: 100%  Weight: 90.7 kg  Height: 5\' 9"  (1.753 m)    Constitutional: NAD, alert and oriented x 3 Eyes: PERRL, lids and conjunctivae normal ENMT: Mucous membranes are dry Neck: normal, supple, no masses, no thyromegaly Respiratory: clear to auscultation bilaterally, no wheezing, no crackles. Normal respiratory effort. No accessory muscle use.  Cardiovascular: Regular rate and rhythm, no murmurs / rubs / gallops. No extremity edema. 2+ pedal pulses. No carotid bruits.  Abdomen: no tenderness, no masses palpated. No hepatosplenomegaly. Bowel sounds positive.  Central adiposity Musculoskeletal: no clubbing / cyanosis. No joint deformity upper and lower extremities.  Skin: no rashes, lesions, ulcers.  Neurologic: No gross focal neurologic deficit. Psychiatric: Normal mood and  affect.   Labs on Admission: I have personally reviewed following labs and imaging studies  CBC: Recent Labs  Lab 04/02/20 0856  WBC 9.5  HGB 10.1*  HCT 33.8*  MCV 78.8*  PLT 161   Basic Metabolic Panel: Recent Labs  Lab 04/02/20 0856  NA 140  K 3.1*  CL 106  CO2 24  GLUCOSE 145*  BUN 10  CREATININE 1.00  CALCIUM 9.6   GFR: Estimated Creatinine Clearance: 89.7 mL/min (by C-G formula based on SCr of 1 mg/dL). Liver Function Tests: Recent Labs  Lab 04/02/20 0856  AST 21  ALT 10  ALKPHOS 53  BILITOT 1.1  PROT 7.9  ALBUMIN 4.3   Recent Labs  Lab 04/02/20 0856  LIPASE 42   No results for input(s): AMMONIA in the last 168 hours. Coagulation Profile: No results for input(s): INR, PROTIME in the last 168 hours. Cardiac Enzymes: No results for input(s): CKTOTAL, CKMB, CKMBINDEX, TROPONINI in the last 168 hours. BNP (last 3 results) No results for  input(s): PROBNP in the last 8760 hours. HbA1C: No results for input(s): HGBA1C in the last 72 hours. CBG: No results for input(s): GLUCAP in the last 168 hours. Lipid Profile: No results for input(s): CHOL, HDL, LDLCALC, TRIG, CHOLHDL, LDLDIRECT in the last 72 hours. Thyroid Function Tests: No results for input(s): TSH, T4TOTAL, FREET4, T3FREE, THYROIDAB in the last 72 hours. Anemia Panel: No results for input(s): VITAMINB12, FOLATE, FERRITIN, TIBC, IRON, RETICCTPCT in the last 72 hours. Urine analysis:    Component Value Date/Time   COLORURINE YELLOW (A) 04/02/2020 0833   APPEARANCEUR CLEAR (A) 04/02/2020 0833   APPEARANCEUR Cloudy (A) 02/27/2020 1406   LABSPEC 1.015 04/02/2020 0833   LABSPEC 1.024 01/03/2013 1856   PHURINE 7.0 04/02/2020 0833   GLUCOSEU NEGATIVE 04/02/2020 0833   GLUCOSEU Negative 01/03/2013 1856   HGBUR NEGATIVE 04/02/2020 0833   BILIRUBINUR NEGATIVE 04/02/2020 0833   BILIRUBINUR Negative 02/27/2020 1406   BILIRUBINUR Negative 01/03/2013 1856   KETONESUR 5 (A) 04/02/2020 0833    PROTEINUR NEGATIVE 04/02/2020 0833   NITRITE NEGATIVE 04/02/2020 0833   LEUKOCYTESUR NEGATIVE 04/02/2020 0833   LEUKOCYTESUR 1+ 01/03/2013 1856    Radiological Exams on Admission: No results found.  EKG: Independently reviewed.  Sinus rhythm Prolonged QT  Assessment/Plan Principal Problem:   Intractable nausea and vomiting Active Problems:   Tobacco use   MDD (major depressive disorder)   Hypokalemia due to excessive gastrointestinal loss of potassium     Intractable nausea and vomiting Etiology is unclear Patient is status post recent upper endoscopy which did not show any acute findings Continue supportive care with IV fluids, IV PPI and antiemetics Patient has a prolonged QT interval twelve-lead EKG and will be placed on Compazine   Nicotine dependence Smoking cessation has been discussed with patient in detail We will place patient on a nicotine transdermal patch 7 mg daily   Depression Continue venlafaxine    Elevated blood pressure No documented history of hypertension We will place patient on IV hydralazine every 6 hours for systolic blood pressure greater than 177mmHg  DVT prophylaxis: Lovenox Code Status: Full code Family Communication: Greater than 50% of time was spent discussing plan of care with patient at the bedside.  She verbalizes understanding and agrees with the plan Disposition Plan: Back to previous home environment Consults called: None    Edilson Vital MD [facility]     04/02/2020, 3:42 PM

## 2020-04-02 NOTE — ED Provider Notes (Signed)
[facility] Emergency Department Provider Note  ____________________________________________   First MD Initiated Contact with Patient 04/02/20 0912     (approximate)  I have reviewed the triage vital signs and the nursing notes.   HISTORY  Chief Complaint Abdominal Pain    HPI Katrina Page is a 39 y.o. female h with past medical history as below including chronic, recurrent nausea and vomiting here with recurrent nausea and vomiting.  The patient states that starting yesterday, she developed recurrent severe epigastric abdominal pain that is aching, gnawing, and worse with any attempted eating.  She has had associated profuse, nonbloody emesis and has been unable to tolerate any kind of p.o. intake for the last 24 hours.  She has had associated weakness and fatigue.  She states her symptoms feel similar to her prior episodes of nausea and vomiting, for which she was just recently hospitalized in the emergency department.  Denies any known or specific food triggers.  No recent fevers or chills.  She is urinating normally.  No constipation or diarrhea.  No other complaints.  No specific alleviating factors.        Past Medical History:  Diagnosis Date   Adrenal nodule ([place]) 06/2019   left   Anemia    Bronchitis    Depression    History of kidney stones    Migraines    Nodule of right lung 06/2019   Pulmonary emboli ([place]) 07/23/2019   bilateral   Thrombocytopenia [facility])     Patient Active Problem List   Diagnosis Date Noted   SIRS (systemic inflammatory response syndrome) (HCC)    Nausea    Intractable vomiting    Intractable vomiting with nausea 03/24/2020   AKI (acute kidney injury) ([place]) 03/24/2020   Anemia 03/24/2020   Thrombocytopenia ([place]) 02/10/2020   Intractable nausea and vomiting 02/09/2020   Hypokalemia due to excessive gastrointestinal loss of potassium    Hypomagnesemia    Left adrenal mass ([place])     Obstructive uropathy    Severe recurrent major depression without psychotic features ([place]) 12/29/2019   Suicide attempt by drug overdose ([place]) 12/29/2019   Chronic back pain 12/29/2019   Nicotine abuse 12/29/2019   MDD (major depressive disorder) 12/28/2019   MDD (major depressive disorder), severe ([place]) 12/24/2019   Alcohol abuse 12/24/2019   Left renal stone 08/25/2019   Chest pain 07/24/2019   Dyspnea 07/24/2019   Tobacco use 07/24/2019   Pulmonary embolism ([place]) 07/23/2019   Sepsis ([place]) 11/29/2018    Past Surgical History:  Procedure Laterality Date   APPENDECTOMY     CHOLECYSTECTOMY     CYSTOSCOPY W/ RETROGRADES Left 03/05/2020   Procedure: CYSTOSCOPY WITH RETROGRADE PYELOGRAM;  Surgeon: Hollice Espy, MD;  Location: [facility] ORS;  Service: Urology;  Laterality: Left;   CYSTOSCOPY WITH STENT PLACEMENT Left 02/09/2020   Procedure: CYSTOSCOPY WITH STENT PLACEMENT;  Surgeon: Hollice Espy, MD;  Location: [facility] ORS;  Service: Urology;  Laterality: Left;   CYSTOSCOPY/URETEROSCOPY/HOLMIUM LASER/STENT PLACEMENT Left 03/05/2020   Procedure: CYSTOSCOPY/URETEROSCOPY/HOLMIUM LASER/STENT EXCHANGE;  Surgeon: Hollice Espy, MD;  Location: [facility] ORS;  Service: Urology;  Laterality: Left;   ESOPHAGOGASTRODUODENOSCOPY (EGD) WITH PROPOFOL N/A 03/27/2020   Procedure: ESOPHAGOGASTRODUODENOSCOPY (EGD) WITH PROPOFOL;  Surgeon: Lucilla Lame, MD;  Location: [facility] ENDOSCOPY;  Service: Endoscopy;  Laterality: N/A;   TUBAL LIGATION      Prior to Admission medications   Medication Sig Start Date End Date Taking? Authorizing Provider  albuterol (VENTOLIN HFA) 108 (90 Base) MCG/ACT inhaler  Inhale 1-2 puffs into the lungs every 4 (four) hours as needed for wheezing or shortness of breath. 01/02/20   Clapacs, Madie Reno, MD  folic acid (FOLVITE) 1 MG tablet Take 1 tablet (1 mg total) by mouth daily. 02/13/20   Lorella Nimrod, MD  hydrOXYzine (ATARAX/VISTARIL) 50 MG tablet Take 1 tablet (50 mg total)  by mouth 3 (three) times daily as needed for anxiety. 03/27/20   Fritzi Mandes, MD  ondansetron (ZOFRAN) 4 MG tablet Take 1 tablet (4 mg total) by mouth every 6 (six) hours as needed for nausea. 02/12/20   Lorella Nimrod, MD  oxybutynin (DITROPAN) 5 MG tablet Take 1 tablet (5 mg total) by mouth every 8 (eight) hours as needed for bladder spasms. 02/12/20   Lorella Nimrod, MD  Oxycodone HCl 10 MG TABS Take 1 tablet (10 mg total) by mouth every 6 (six) hours as needed (moderate to severe pain). 03/05/20   Hollice Espy, MD  pantoprazole (PROTONIX) 40 MG tablet Take 1 tablet (40 mg total) by mouth daily. 03/27/20   Fritzi Mandes, MD  promethazine (PHENERGAN) 12.5 MG tablet Take 1 tablet (12.5 mg total) by mouth 2 (two) times daily. 03/27/20   Fritzi Mandes, MD  traZODone (DESYREL) 100 MG tablet Take 2 tablets (200 mg total) by mouth at bedtime. 01/02/20   Clapacs, Madie Reno, MD  venlafaxine XR (EFFEXOR-XR) 75 MG 24 hr capsule Take 1 capsule (75 mg total) by mouth daily with breakfast. 03/27/20   Fritzi Mandes, MD  vitamin B-12 1000 MCG tablet Take 1 tablet (1,000 mcg total) by mouth daily. 02/13/20   Lorella Nimrod, MD    Allergies Patient has no known allergies.  Family History  Problem Relation Age of Onset   Hypertension Mother    Diabetes Mother    Stroke Mother    Breast cancer Mother     Social History Social History   Tobacco Use   Smoking status: Current Every Day Smoker    Packs/day: 0.25    Years: 20.00    Pack years: 5.00    Types: Cigarettes   Smokeless tobacco: Never Used  Vaping Use   Vaping Use: Never used  Substance Use Topics   Alcohol use: Yes    Alcohol/week: 6.0 standard drinks    Types: 6 Shots of liquor per week   Drug use: No    Review of Systems  Review of Systems  Constitutional: Positive for fatigue. Negative for fever.  HENT: Negative for congestion and sore throat.   Eyes: Negative for visual disturbance.  Respiratory: Negative for cough and shortness of breath.    Cardiovascular: Negative for chest pain.  Gastrointestinal: Positive for abdominal pain, nausea and vomiting. Negative for diarrhea.  Genitourinary: Negative for flank pain.  Musculoskeletal: Negative for back pain and neck pain.  Skin: Negative for rash and wound.  Neurological: Negative for weakness.  All other systems reviewed and are negative.    ____________________________________________  PHYSICAL EXAM:      VITAL SIGNS: ED Triage Vitals [04/02/20 0831]  Enc Vitals Group     BP (!) 164/94     Pulse Rate 68     Resp 18     Temp      Temp Source Oral     SpO2 100 %     Weight 200 lb (90.7 kg)     Height 5\' 9"  (1.753 m)     Head Circumference      Peak Flow      Pain  Score 10     Pain Loc      Pain Edu?      Excl. in [place]?      Physical Exam Vitals and nursing note reviewed.  Constitutional:      General: She is not in acute distress.    Appearance: She is well-developed.  HENT:     Head: Normocephalic and atraumatic.     Mouth/Throat:     Comments: Mildly dry Eyes:     Conjunctiva/sclera: Conjunctivae normal.  Cardiovascular:     Rate and Rhythm: Normal rate and regular rhythm.     Heart sounds: Normal heart sounds. No murmur heard.  No friction rub.  Pulmonary:     Effort: Pulmonary effort is normal. No respiratory distress.     Breath sounds: Normal breath sounds. No wheezing or rales.  Abdominal:     General: There is no distension.     Palpations: Abdomen is soft.     Tenderness: There is generalized abdominal tenderness.  Musculoskeletal:     Cervical back: Neck supple.  Skin:    General: Skin is warm.     Capillary Refill: Capillary refill takes less than 2 seconds.  Neurological:     Mental Status: She is alert and oriented to person, place, and time.     Motor: No abnormal muscle tone.       ____________________________________________   LABS (all labs ordered are listed, but only abnormal results are displayed)  Labs Reviewed    COMPREHENSIVE METABOLIC PANEL - Abnormal; Notable for the following components:      Result Value   Potassium 3.1 (*)    Glucose, Bld 145 (*)    All other components within normal limits  CBC - Abnormal; Notable for the following components:   Hemoglobin 10.1 (*)    HCT 33.8 (*)    MCV 78.8 (*)    MCH 23.5 (*)    MCHC 29.9 (*)    RDW 24.1 (*)    All other components within normal limits  URINALYSIS, COMPLETE (UACMP) WITH MICROSCOPIC - Abnormal; Notable for the following components:   Color, Urine YELLOW (*)    APPearance CLEAR (*)    Ketones, ur 5 (*)    All other components within normal limits  SARS CORONAVIRUS 2 BY RT PCR (HOSPITAL ORDER, [facility])  LIPASE, BLOOD  PREGNANCY, URINE    ____________________________________________  EKG: Atrial flutter, VR 76.  QRS 105, QTc 523.  Significant baseline wandering and artifact somewhat limits interpretation.  Within limitations, no obvious ST elevations ________________________________________  RADIOLOGY All imaging, including plain films, CT scans, and ultrasounds, independently reviewed by me, and interpretations confirmed via formal radiology reads.  ED MD interpretation:   None  Official radiology report(s): No results found.  ____________________________________________  PROCEDURES   Procedure(s) performed (including Critical Care):  Procedures  ____________________________________________  INITIAL IMPRESSION / MDM / [place] / ED COURSE  As part of my medical decision making, I reviewed the following data within the [place] notes reviewed and incorporated, Old chart reviewed, Notes from prior ED visits, and [place] Controlled Substance Database       *JAZZMINE KLEIMAN was evaluated in Emergency Department on 04/02/2020 for the symptoms described in the history of present illness. She was evaluated in the context of the global COVID-19 pandemic, which  necessitated consideration that the patient might be at risk for infection with the SARS-CoV-2 virus that causes COVID-19.  Institutional protocols and algorithms that pertain to the evaluation of patients at risk for COVID-19 are in a state of rapid change based on information released by regulatory bodies including the CDC and federal and state organizations. These policies and algorithms were followed during the patient's care in the ED.  Some ED evaluations and interventions may be delayed as a result of limited staffing during the pandemic.*     Medical Decision Making:  40 yo F here with recurrent nausea, vomiting, abd pain. H/o similar ED visits for same. On arrival, pt vomiting in mild distress. Exam as above. Labs show mild hypokalemia, hypomagnesemia, c/w dehydration. Renal function is at baseline. Lipase normal. UA without signs of UTI but does show ketonuria. Pt given fluids, antiemetics, ltyte replacement but remains nauseous and vomiting. Will admit. No focal abd TTP, no leukocytosis, do not feel CT imaging indicated at this time.  ____________________________________________  FINAL CLINICAL IMPRESSION(S) / ED DIAGNOSES  Final diagnoses:  Intractable nausea and vomiting  Dehydration     MEDICATIONS GIVEN DURING THIS VISIT:  Medications  sodium chloride flush (NS) 0.9 % injection 3 mL (3 mLs Intravenous Not Given 04/02/20 0906)  ondansetron (ZOFRAN) injection 4 mg (has no administration in time range)  promethazine (PHENERGAN) injection 12.5 mg (12.5 mg Intramuscular Given 04/02/20 0858)  haloperidol lactate (HALDOL) injection 2.5 mg (2.5 mg Intravenous Given 04/02/20 0928)  diphenhydrAMINE (BENADRYL) injection 25 mg (25 mg Intravenous Given 04/02/20 0928)  sodium chloride 0.9 % bolus 1,000 mL (0 mLs Intravenous Stopped 04/02/20 1134)  magnesium sulfate IVPB 2 g 50 mL (0 g Intravenous Stopped 04/02/20 1134)  potassium chloride 10 mEq in 100 mL IVPB (0 mEq Intravenous Stopped 04/02/20  1134)  LORazepam (ATIVAN) injection 1 mg (1 mg Intravenous Given 04/02/20 1034)  HYDROmorphone (DILAUDID) injection 0.5 mg (0.5 mg Intravenous Given 04/02/20 1200)     ED Discharge Orders    None       Note:  This document was prepared using Dragon voice recognition software and may include unintentional dictation errors.   Duffy Bruce, MD 04/02/20 [phone]

## 2020-04-02 NOTE — ED Notes (Signed)
Pt calling out for more medication, reports still nauseated

## 2020-04-02 NOTE — ED Notes (Signed)
Report given to Ol[place], RN. 

## 2020-04-02 NOTE — ED Notes (Signed)
Pt calling out per Network engineer. Informed will be in room as soon as can, currently with another patient.

## 2020-04-02 NOTE — ED Notes (Signed)
Pt given blanket.

## 2020-04-03 ENCOUNTER — Encounter: Payer: Self-pay | Admitting: Internal Medicine

## 2020-04-03 DIAGNOSIS — R112 Nausea with vomiting, unspecified: Secondary | ICD-10-CM | POA: Diagnosis present

## 2020-04-03 LAB — BASIC METABOLIC PANEL
Anion gap: 8 (ref 5–15)
BUN: 9 mg/dL (ref 6–20)
CO2: 25 mmol/L (ref 22–32)
Calcium: 8.6 mg/dL — ABNORMAL LOW (ref 8.9–10.3)
Chloride: 107 mmol/L (ref 98–111)
Creatinine, Ser: 0.81 mg/dL (ref 0.44–1.00)
GFR calc Af Amer: >60 mL/min (ref >60)
GFR calc non Af Amer: >60 mL/min (ref >60)
Glucose, Bld: 82 mg/dL (ref 70–99)
Potassium: 3.7 mmol/L (ref 3.5–5.1)
Sodium: 140 mmol/L (ref 135–145)

## 2020-04-03 LAB — CBC
HCT: 29.3 % — ABNORMAL LOW (ref 36.0–46.0)
Hemoglobin: 9 g/dL — ABNORMAL LOW (ref 12.0–15.0)
MCH: 24.4 pg — ABNORMAL LOW (ref 26.0–34.0)
MCHC: 30.7 g/dL (ref 30.0–36.0)
MCV: 79.4 fL — ABNORMAL LOW (ref 80.0–100.0)
Platelets: 240 10*3/uL (ref 150–400)
RBC: 3.69 MIL/uL — ABNORMAL LOW (ref 3.87–5.11)
RDW: 24.4 % — ABNORMAL HIGH (ref 11.5–15.5)
WBC: 6.9 10*3/uL (ref 4.0–10.5)
nRBC: 0 % (ref 0.0–0.2)

## 2020-04-03 LAB — MAGNESIUM: Magnesium: 2 mg/dL (ref 1.7–2.4)

## 2020-04-03 MED ORDER — PROMETHAZINE HCL 25 MG/ML IJ SOLN
12.5000 mg | Freq: Four times a day (QID) | INTRAMUSCULAR | Status: DC | PRN
  Administered 2020-04-03 – 2020-04-04 (×4): 12.5 mg via INTRAVENOUS
  Filled 2020-04-03 (×4): qty 1

## 2020-04-03 MED ORDER — METOCLOPRAMIDE HCL 5 MG PO TABS
5.0000 mg | ORAL_TABLET | Freq: Three times a day (TID) | ORAL | Status: DC
  Administered 2020-04-03 – 2020-04-04 (×5): 5 mg via ORAL
  Filled 2020-04-03 (×5): qty 1

## 2020-04-03 MED ORDER — BOOST / RESOURCE BREEZE PO LIQD CUSTOM
1.0000 | Freq: Three times a day (TID) | ORAL | Status: DC
  Administered 2020-04-03 – 2020-04-04 (×4): 1 via ORAL

## 2020-04-03 MED ORDER — MORPHINE SULFATE (PF) 2 MG/ML IV SOLN
2.0000 mg | INTRAVENOUS | Status: DC | PRN
  Administered 2020-04-03 – 2020-04-04 (×5): 2 mg via INTRAVENOUS
  Filled 2020-04-03 (×5): qty 1

## 2020-04-03 MED ORDER — METOCLOPRAMIDE HCL 5 MG PO TABS: 5.0000 mg | ORAL_TABLET | Freq: Three times a day (TID) | ORAL | Status: DC

## 2020-04-03 NOTE — Progress Notes (Addendum)
Progress Note    Katrina Page  PJA:[id number] DOB: [birth year]  DOA: 04/02/2020 PCP: Patient, No Pcp Per      Brief Narrative:    Medical records reviewed and are as summarized below:  Katrina Page is a 39 y.o. female   with medical history significant fornephrolithiasis, pulmonary embolism previously on Xarelto, depression, alcohol abuse who presented with persistent nausea and vomiting associated with abdominal pain. Patient was recently discharged from the hospital on 03/27/2020 for same and had an upper endoscopy which did not show any acute findings.        Assessment/Plan:   Principal Problem:   Intractable nausea and vomiting Active Problems:   Tobacco use   MDD (major depressive disorder)   Hypokalemia due to excessive gastrointestinal loss of potassium   Refractory nausea and vomiting   Abdominal pain, intractable nausea and vomiting: Antiemetics as needed.  IV fluids for hydration.  Analgesics as needed for pain.  Recent endoscopy prior to this admission was unremarkable.  Hypokalemia: Improved.  Continue to monitor.  Check magnesium level.  Prolonged QT interval: Improved from 503 to 418   Major depressive disorder: Patient is on venlafaxine but she said that she thinks this is not helping her.  She says she has been on venlafaxine for 2 months.  It is suspected that her GI symptoms may be related to depression.  Psychiatrist, Dr. Janese Banks, was consulted to assist with management.  He recommended consulting transition of care team to assist with outpatient follow-up.  Elevated blood pressure: Probably due to pain.  Monitor BP.  Tobacco use disorder: Counseled to quit smoking.   Nutrition Problem: Inadequate oral intake Etiology: acute illness  Signs/Symptoms: per patient/family report   Body mass index is 29.53 kg/m.  Diet Order            Diet clear liquid Room service appropriate? Yes; Fluid consistency: Thin  Diet effective now                         Medications:   . enoxaparin (LOVENOX) injection  40 mg Subcutaneous Q24H  . feeding supplement  1 Container Oral TID BM  . ferrous [id number] C-folic acid  1 capsule Oral Daily  . folic acid  1 mg Oral Daily  . metoCLOPramide  5 mg Oral TID AC  . nicotine  14 mg Transdermal Daily  . pantoprazole (PROTONIX) IV  40 mg Intravenous Q24H  . sodium chloride flush  3 mL Intravenous Once  . sodium chloride flush  3 mL Intravenous Q12H  . venlafaxine XR  75 mg Oral Q breakfast  . cyanocobalamin  1,000 mcg Oral Daily   Continuous Infusions: . 0.9 % NaCl with KCl 40 mEq / L 100 mL/hr at 04/03/20 0300     Anti-infectives (From admission, onward)   None             Family Communication/Anticipated D/C date and plan/Code Status   DVT prophylaxis: enoxaparin (LOVENOX) injection 40 mg Start: 04/02/20 2200     Code Status: Full Code  Family Communication: Plan discussed with patient Disposition Plan:    Status is: Inpatient  Remains inpatient appropriate because:IV treatments appropriate due to intensity of illness or inability to take PO and Inpatient level of care appropriate due to severity of illness   Dispo: The patient is from: Home              Anticipated d/c is  to: Home              Anticipated d/c date is: 1 day              Patient currently is not medically stable to d/c.              Subjective:   C/o nausea and vomiting, abdominal pain, chronic low back pain. She feels depressed and Venlafaxine doesn't work well  Objective:    Vitals:   04/02/20 2001 04/03/20 0522 04/03/20 0529 04/03/20 0656  BP: (!) 146/91 (!) 173/101 (!) 165/95 (!) 141/87  Pulse: 73 73 71 95  Resp: 20 20  16   Temp: 98.5 F (36.9 C) 97.8 F (36.6 C)  98 F (36.7 C)  TempSrc: Oral Oral  Oral  SpO2: 100% 100%  100%  Weight:      Height:       No data found.   Intake/Output Summary (Last 24 hours) at 04/03/2020 1028 Last data filed at  04/03/2020 0900 Gross per 24 hour  Intake 2348.03 ml  Output --  Net 2348.03 ml   Filed Weights   04/02/20 0831  Weight: 90.7 kg    Exam:  GEN: NAD SKIN: No rash EYES: EOMI ENT: MMM CV: RRR PULM: CTA B ABD: soft, obese, tenderness in epigastric and right side of abdomen, +BS CNS: AAO x 3, non focal EXT: No edema or tenderness MSK: Lumbar spinal tenderness PSYCH: Calm, cooperative, depressed    Juan, RN at the bedside   Data Reviewed:   I have personally reviewed following labs and imaging studies:  Labs: Labs show the following:   Basic Metabolic Panel: Recent Labs  Lab 04/02/20 0856 04/03/20 0454  NA 140 140  K 3.1* 3.7  CL 106 107  CO2 24 25  GLUCOSE 145* 82  BUN 10 9  CREATININE 1.00 0.81  CALCIUM 9.6 8.6*   GFR Estimated Creatinine Clearance: 110.8 mL/min (by C-G formula based on SCr of 0.81 mg/dL). Liver Function Tests: Recent Labs  Lab 04/02/20 0856  AST 21  ALT 10  ALKPHOS 53  BILITOT 1.1  PROT 7.9  ALBUMIN 4.3   Recent Labs  Lab 04/02/20 0856  LIPASE 42   No results for input(s): AMMONIA in the last 168 hours. Coagulation profile No results for input(s): INR, PROTIME in the last 168 hours.  CBC: Recent Labs  Lab 04/02/20 0856 04/03/20 0454  WBC 9.5 6.9  HGB 10.1* 9.0*  HCT 33.8* 29.3*  MCV 78.8* 79.4*  PLT 298 240   Cardiac Enzymes: No results for input(s): CKTOTAL, CKMB, CKMBINDEX, TROPONINI in the last 168 hours. BNP (last 3 results) No results for input(s): PROBNP in the last 8760 hours. CBG: No results for input(s): GLUCAP in the last 168 hours. D-Dimer: No results for input(s): DDIMER in the last 72 hours. Hgb A1c: No results for input(s): HGBA1C in the last 72 hours. Lipid Profile: No results for input(s): CHOL, HDL, LDLCALC, TRIG, CHOLHDL, LDLDIRECT in the last 72 hours. Thyroid function studies: No results for input(s): TSH, T4TOTAL, T3FREE, THYROIDAB in the last 72 hours.  Invalid input(s):  FREET3 Anemia work up: No results for input(s): VITAMINB12, FOLATE, FERRITIN, TIBC, IRON, RETICCTPCT in the last 72 hours. Sepsis Labs: Recent Labs  Lab 04/02/20 0856 04/03/20 0454  WBC 9.5 6.9    Microbiology Recent Results (from the past 240 hour(s))  Urine Culture     Status: Abnormal   Collection Time: 03/24/20  3:25 PM  Specimen: Urine, Random  Result Value Ref Range Status   Specimen Description   Final    URINE, RANDOM Performed at [facility], [address], [place], [place] [address]    Special Requests   Final    NONE Performed at [facility], [address] [place], [place] [address]    Culture (A)  Final    <10,000 COLONIES/mL INSIGNIFICANT GROWTH Performed at [facility] [address], [place], [place] [address]    Report Status 03/26/2020 FINAL  Final  SARS Coronavirus 2 by RT PCR (hospital order, performed in [facility] hospital lab) Nasopharyngeal Nasopharyngeal Swab     Status: None   Collection Time: 03/24/20  6:38 PM   Specimen: Nasopharyngeal Swab  Result Value Ref Range Status   SARS Coronavirus 2 NEGATIVE NEGATIVE Final    Comment: (NOTE) SARS-CoV-2 target nucleic acids are NOT DETECTED.  The SARS-CoV-2 RNA is generally detectable in upper and lower respiratory specimens during the acute phase of infection. The lowest concentration of SARS-CoV-2 viral copies this assay can detect is 250 copies / mL. A negative result does not preclude SARS-CoV-2 infection and should not be used as the sole basis for treatment or other patient management decisions.  A negative result may occur with improper specimen collection / handling, submission of specimen other than nasopharyngeal swab, presence of viral mutation(s) within the areas targeted by this assay, and inadequate number of viral copies (<250 copies / mL). A negative result must be combined with clinical observations, patient history, and epidemiological  information.  Fact Sheet for Patients:   StrictlyIdeas.no  Fact Sheet for Healthcare Providers: BankingDealers.co.za  This test is not yet approved or  cleared by the [place] FDA and has been authorized for detection and/or diagnosis of SARS-CoV-2 by FDA under an Emergency Use Authorization (EUA).  This EUA will remain in effect (meaning this test can be used) for the duration of the COVID-19 declaration under Section 564(b)(1) of the Act, 21 U.S.C. section 360bbb-3(b)(1), unless the authorization is terminated or revoked sooner.  Performed at [facility], [address] [place], [place] [address]   Blood Culture (routine x 2)     Status: None   Collection Time: 03/24/20  7:24 PM   Specimen: BLOOD  Result Value Ref Range Status   Specimen Description BLOOD BLOOD LEFT HAND  Final   Special Requests   Final    BOTTLES DRAWN AEROBIC AND ANAEROBIC Blood Culture results may not be optimal due to an excessive volume of blood received in culture bottles   Culture   Final    NO GROWTH 5 DAYS Performed at [facility], [address], [place], [place] [address]    Report Status 03/29/2020 FINAL  Final  Blood Culture (routine x 2)     Status: None   Collection Time: 03/24/20  7:30 PM   Specimen: BLOOD  Result Value Ref Range Status   Specimen Description BLOOD BLOOD RIGHT HAND  Final   Special Requests   Final    BOTTLES DRAWN AEROBIC AND ANAEROBIC Blood Culture adequate volume   Culture   Final    NO GROWTH 5 DAYS Performed at [facility], [address], [place], [place] [address]    Report Status 03/29/2020 FINAL  Final  SARS Coronavirus 2 by RT PCR (hospital order, performed in [place] hospital lab) Nasopharyngeal Nasopharyngeal Swab     Status: None   Collection Time: 04/02/20  2:26 PM   Specimen: Nasopharyngeal Swab  Result Value Ref Range Status   SARS Coronavirus 2 NEGATIVE NEGATIVE  Final    Comment: (NOTE) SARS-CoV-2 target nucleic acids are NOT DETECTED.  The SARS-CoV-2 RNA is generally detectable in upper and lower respiratory specimens during the acute phase of infection. The lowest concentration of SARS-CoV-2 viral copies this assay can detect is 250 copies / mL. A negative result does not preclude SARS-CoV-2 infection and should not be used as the sole basis for treatment or other patient management decisions.  A negative result may occur with improper specimen collection / handling, submission of specimen other than nasopharyngeal swab, presence of viral mutation(s) within the areas targeted by this assay, and inadequate number of viral copies (<250 copies / mL). A negative result must be combined with clinical observations, patient history, and epidemiological information.  Fact Sheet for Patients:   StrictlyIdeas.no  Fact Sheet for Healthcare Providers: BankingDealers.co.za  This test is not yet approved or  cleared by the [place] FDA and has been authorized for detection and/or diagnosis of SARS-CoV-2 by FDA under an Emergency Use Authorization (EUA).  This EUA will remain in effect (meaning this test can be used) for the duration of the COVID-19 declaration under Section 564(b)(1) of the Act, 21 U.S.C. section 360bbb-3(b)(1), unless the authorization is terminated or revoked sooner.  Performed at [facility], [address] [place], [place] [address]     Procedures and diagnostic studies:  No results found.             LOS: 0 days   Franceen Erisman  [facility]     04/03/2020, 10:28 AM

## 2020-04-03 NOTE — Progress Notes (Signed)
Initial Nutrition Assessment  DOCUMENTATION CODES:   Not applicable  INTERVENTION:   Boost Breeze po TID, each supplement provides 250 kcal and 9 grams of protein  NUTRITION DIAGNOSIS:   Inadequate oral intake related to acute illness as evidenced by per patient/family report.  GOAL:   Patient will meet greater than or equal to 90% of their needs  MONITOR:   PO intake, Supplement acceptance, Labs, Weight trends, Skin, I & O's  REASON FOR ASSESSMENT:   Malnutrition Screening Tool    ASSESSMENT:   39 y.o. female with medical history significant for MDD, nephrolithiasis, pulmonary embolism previously on Xarelto, depression, alcohol abuse and cannaboid abuse who presents with persistent nausea and vomiting  Pt is well known to this RD from multiple previous admits. Pt with poor appetite and oral intake for several weeks pta r/t nausea and vomiting. Pt with continued nausea today. Pt is generally compliant with supplements. Pt is willing to drink chocolate supplements once diet advanced. Per chart, pt down 23lbs(10%) in <7 months; RD unsure how recently weight loss occurred.     Medications reviewed and include: lovenox, ferrous fumerate, folic acid, reglan, nicotine, protonix, B12, NaCl w/ KCl @100ml /hr  Labs reviewed: Hgb 9.0(L), Hct 29.3(L), MCV 79.4(L), MCH 24.4(L)  NUTRITION - FOCUSED PHYSICAL EXAM:    Most Recent Value  Orbital Region No depletion  Upper Arm Region No depletion  Thoracic and Lumbar Region No depletion  Buccal Region No depletion  Temple Region No depletion  Clavicle Bone Region No depletion  Clavicle and Acromion Bone Region No depletion  Scapular Bone Region No depletion  Dorsal Hand No depletion  Patellar Region No depletion  Anterior Thigh Region No depletion  Posterior Calf Region No depletion  Edema (RD Assessment) None  Hair Reviewed  Eyes Reviewed  Mouth Reviewed  Skin Reviewed  Nails Reviewed     Diet Order:   Diet Order             Diet clear liquid Room service appropriate? Yes; Fluid consistency: Thin  Diet effective now                EDUCATION NEEDS:   No education needs have been identified at this time  Skin:  Skin Assessment: Reviewed RN Assessment  Last BM:  7/12  Height:   Ht Readings from Last 1 Encounters:  04/02/20 5\' 9"  (1.753 m)    Weight:   Wt Readings from Last 1 Encounters:  04/02/20 90.7 kg    Ideal Body Weight:  65.9 kg  BMI:  Body mass index is 29.53 kg/m.  Estimated Nutritional Needs:   Kcal:  2000-2300kcal/day  Protein:  100-115g/day  Fluid:  >2L/day  Koleen Distance MS, RD, LDN Please refer to [facility] for RD and/or RD on-call/weekend/after hours pager

## 2020-04-04 ENCOUNTER — Ambulatory Visit: Payer: Self-pay | Admitting: Urology

## 2020-04-04 DIAGNOSIS — E860 Dehydration: Secondary | ICD-10-CM

## 2020-04-04 MED ORDER — TRAZODONE HCL 50 MG PO TABS: 50.0000 mg | ORAL_TABLET | Freq: Every day | ORAL | 0 refills | Status: DC

## 2020-04-04 MED ORDER — METOCLOPRAMIDE HCL 5 MG PO TABS: 5.0000 mg | ORAL_TABLET | Freq: Three times a day (TID) | ORAL | 0 refills | Status: DC

## 2020-04-04 MED ORDER — TRAMADOL HCL 50 MG PO TABS: 50.0000 mg | ORAL_TABLET | Freq: Two times a day (BID) | ORAL | 0 refills | Status: AC | PRN

## 2020-04-04 NOTE — Discharge Summary (Signed)
Physician Discharge Summary  Katrina Page ZOX:[id number] DOB: 01-03-1981 DOA: 04/02/2020  PCP: Patient, No Pcp Per  Admit date: 04/02/2020 Discharge date: 04/04/2020  Admitted From: Home.  Disposition:  Home.   Recommendations for Outpatient Follow-up:  1. Follow up with PCP in 1-2 weeks 2. Please obtain BMP/CBC in one week 3. Please follow up with psychiatry/ PCP for medications     Discharge Condition:stable.  CODE STATUS:FULL CODE.  Diet recommendation: Heart Healthy/ soft diet.   Brief/Interim Summary:  Katrina Page is a 39 y.o. female with medical history significant fornephrolithiasis, pulmonary embolism previously on Xarelto, depression, alcohol abuse who presented with persistent nausea and vomiting associated with abdominal pain. Patient was recently discharged from the hospital on 03/27/2020 for same and had an upper endoscopy which did not show any acute findings.  Discharge Diagnoses:  Principal Problem:   Intractable nausea and vomiting Active Problems:   Tobacco use   MDD (major depressive disorder)   Hypokalemia due to excessive gastrointestinal loss of potassium   Refractory nausea and vomiting   Nausea and vomiting  Abdominal pain, intractable nausea and vomiting  Differentials include gastroparesis worsened by narcotic pain meds vs cyclical vomiting syndrome vs gastritis vs gastroenteritis.  onexam today, her nausea improved, vomiting resolved and abd pain has resolved.  She was able to tolerate soft diet without any symptoms.    Recent endoscopy prior to this admission was unremarkable.  Hypokalemia: replaced.   Prolonged QT interval: Improved from 503 to 418   Major depressive disorder: Patient is on venlafaxine but she said that she thinks this is not helping her.  She says she has been on venlafaxine for 2 months.  It is suspected that her GI symptoms may be related to depression.  Psychiatrist, Dr. Janese Banks, was consulted to assist with management.   He recommended consulting transition of care team to assist with outpatient follow-up.    Tobacco use disorder: Counseled to quit smoking.   Nutrition Problem: Inadequate oral intake Etiology: acute illness    Body mass index is 29.53 kg/m.   Discharge Instructions  Discharge Instructions    Diet - low sodium heart healthy   Complete by: As directed    Discharge instructions   Complete by: As directed    Please follow up with psychiatry for medication changes. Please follow up with PCP in one week.     Allergies as of 04/04/2020   No Known Allergies     Medication List    TAKE these medications   albuterol 108 (90 Base) MCG/ACT inhaler Commonly known as: VENTOLIN HFA Inhale 1-2 puffs into the lungs every 4 (four) hours as needed for wheezing or shortness of breath.   cyanocobalamin 1000 MCG tablet Take 1 tablet (1,000 mcg total) by mouth daily.   folic acid 1 MG tablet Commonly known as: FOLVITE Take 1 tablet (1 mg total) by mouth daily.   Folic Acid-Vit W1-XBJ Y78 0.4-50-0.1 MG Tabs SMARTSIG:1 Tablet(s) By Mouth Daily   hydrOXYzine 50 MG tablet Commonly known as: ATARAX/VISTARIL Take 1 tablet (50 mg total) by mouth 3 (three) times daily as needed for anxiety.   metoCLOPramide 5 MG tablet Commonly known as: REGLAN Take 1 tablet (5 mg total) by mouth 3 (three) times daily before meals.   oxybutynin 5 MG tablet Commonly known as: DITROPAN Take 1 tablet (5 mg total) by mouth every 8 (eight) hours as needed for bladder spasms.   Oxycodone HCl 10 MG Tabs Take 1 tablet (10  mg total) by mouth every 6 (six) hours as needed (moderate to severe pain).   pantoprazole 40 MG tablet Commonly known as: PROTONIX Take 1 tablet (40 mg total) by mouth daily.   potassium chloride 10 MEQ CR capsule Commonly known as: MICRO-K Take 20 mEq by mouth daily.   promethazine 12.5 MG tablet Commonly known as: PHENERGAN Take 1 tablet (12.5 mg total) by mouth 2 (two)  times daily.   traMADol 50 MG tablet Commonly known as: Ultram Take 1 tablet (50 mg total) by mouth every 12 (twelve) hours as needed for up to 3 days.   venlafaxine XR 75 MG 24 hr capsule Commonly known as: EFFEXOR-XR Take 1 capsule (75 mg total) by mouth daily with breakfast.       No Known Allergies  Consultations: None.   Procedures/Studies: CT ABDOMEN PELVIS W CONTRAST  Result Date: 03/24/2020 CLINICAL DATA:  Onset nausea and vomiting last night. EXAM: CT ABDOMEN AND PELVIS WITH CONTRAST TECHNIQUE: Multidetector CT imaging of the abdomen and pelvis was performed using the standard protocol following bolus administration of intravenous contrast. CONTRAST:  100 mL OMNIPAQUE IOHEXOL 300 MG/ML  SOLN COMPARISON:  CT abdomen and pelvis 02/11/2020. PET CT scan 08/23/2019. FINDINGS: Lower chest: Lung bases are clear. No pleural or pericardial effusion. Hepatobiliary: No focal liver abnormality is seen. Status post cholecystectomy. No biliary dilatation. Fatty infiltration of the liver again seen. Pancreas: Unremarkable. No pancreatic ductal dilatation or surrounding inflammatory changes. Spleen: Normal in size without focal abnormality. Adrenals/Urinary Tract: Bilateral adrenal lesions are again seen. The larger lesion is on the left and measures 5.9 x 4.3 cm in the axial plane on image 30. Right adrenal nodule measures 1.7 x 1.3 cm on image 22. These lesions have been characterized as adenomas on prior PET CT scan. The patient has multiple punctate nonobstructing renal stones in both kidneys. There is also scarring of both kidneys, more extensive on the left. Small cyst in the upper pole of the left kidney and a cyst in lower pole the right kidney are unchanged. Stranding is seen in left renal hilum and about the renal pelvis, improved since the most recent CT. Double-J ureteral stent on the left has been removed since the prior CT. The right ureter appears normal. Urinary bladder is unremarkable.  Stomach/Bowel: Stomach is within normal limits. The patient is status post appendectomy. No evidence of bowel wall thickening, distention, or inflammatory changes. Mild sigmoid diverticulosis noted. Vascular/Lymphatic: No significant vascular findings are present. No enlarged abdominal or pelvic lymph nodes. Reproductive: Uterus and bilateral adnexa are unremarkable. Other: None. Musculoskeletal: Negative. IMPRESSION: Stranding about the left renal collecting system and pelvis appears improved compared to the most recent CT but could be infectious or inflammatory. Left double-J ureteral stent has been removed since the most recent exam. Multiple punctate nonobstructing bilateral renal stones. Negative for hydronephrosis. Fatty infiltration of the liver. Bilateral adrenal lesions compatible with adenomas as seen on prior exams. Mild diverticulosis without diverticulitis. Electronically Signed   By: Inge Rise M.D.   On: 03/24/2020 16:33     Subjective: Nausea has improved, no vomiting. Pain has improved.   Discharge Exam: Vitals:   04/04/20 0502 04/04/20 1141  BP: (!) 155/96 (!) 154/90  Pulse: 79 84  Resp: 17 14  Temp: 98.2 F (36.8 C) 98.8 F (37.1 C)  SpO2: 95% 100%   Vitals:   04/03/20 1210 04/03/20 1952 04/04/20 0502 04/04/20 1141  BP: (!) 139/92 (!) 158/88 (!) 155/96 (!) 154/90  Pulse: 84 78 79 84  Resp:  18 17 14   Temp: 98.6 F (37 C) 98.2 F (36.8 C) 98.2 F (36.8 C) 98.8 F (37.1 C)  TempSrc: Oral Oral Oral Oral  SpO2: 100% 99% 95% 100%  Weight:      Height:        General: Pt is alert, awake, not in acute distress Cardiovascular: RRR, S1/S2 +, no rubs, no gallops Respiratory: CTA bilaterally, no wheezing, no rhonchi Abdominal: Soft, NT, ND, bowel sounds + Extremities: no edema, no cyanosis    The results of significant diagnostics from this hospitalization (including imaging, microbiology, ancillary and laboratory) are listed below for reference.      Microbiology: Recent Results (from the past 240 hour(s))  SARS Coronavirus 2 by RT PCR (hospital order, performed in [facility] hospital lab) Nasopharyngeal Nasopharyngeal Swab     Status: None   Collection Time: 04/02/20  2:26 PM   Specimen: Nasopharyngeal Swab  Result Value Ref Range Status   SARS Coronavirus 2 NEGATIVE NEGATIVE Final    Comment: (NOTE) SARS-CoV-2 target nucleic acids are NOT DETECTED.  The SARS-CoV-2 RNA is generally detectable in upper and lower respiratory specimens during the acute phase of infection. The lowest concentration of SARS-CoV-2 viral copies this assay can detect is 250 copies / mL. A negative result does not preclude SARS-CoV-2 infection and should not be used as the sole basis for treatment or other patient management decisions.  A negative result may occur with improper specimen collection / handling, submission of specimen other than nasopharyngeal swab, presence of viral mutation(s) within the areas targeted by this assay, and inadequate number of viral copies (<250 copies / mL). A negative result must be combined with clinical observations, patient history, and epidemiological information.  Fact Sheet for Patients:   StrictlyIdeas.no  Fact Sheet for Healthcare Providers: BankingDealers.co.za  This test is not yet approved or  cleared by the [place] FDA and has been authorized for detection and/or diagnosis of SARS-CoV-2 by FDA under an Emergency Use Authorization (EUA).  This EUA will remain in effect (meaning this test can be used) for the duration of the COVID-19 declaration under Section 564(b)(1) of the Act, 21 U.S.C. section 360bbb-3(b)(1), unless the authorization is terminated or revoked sooner.  Performed at [facility], [address] [place], [place] [address]      Labs: BNP (last 3 results) No results for input(s): BNP in the last 8760 hours. Basic  Metabolic Panel: Recent Labs  Lab 04/02/20 0856 04/03/20 0454  NA 140 140  K 3.1* 3.7  CL 106 107  CO2 24 25  GLUCOSE 145* 82  BUN 10 9  CREATININE 1.00 0.81  CALCIUM 9.6 8.6*  MG  --  2.0   Liver Function Tests: Recent Labs  Lab 04/02/20 0856  AST 21  ALT 10  ALKPHOS 53  BILITOT 1.1  PROT 7.9  ALBUMIN 4.3   Recent Labs  Lab 04/02/20 0856  LIPASE 42   No results for input(s): AMMONIA in the last 168 hours. CBC: Recent Labs  Lab 04/02/20 0856 04/03/20 0454  WBC 9.5 6.9  HGB 10.1* 9.0*  HCT 33.8* 29.3*  MCV 78.8* 79.4*  PLT 298 240   Cardiac Enzymes: No results for input(s): CKTOTAL, CKMB, CKMBINDEX, TROPONINI in the last 168 hours. BNP: Invalid input(s): POCBNP CBG: No results for input(s): GLUCAP in the last 168 hours. D-Dimer No results for input(s): DDIMER in the last 72 hours. Hgb A1c No results  for input(s): HGBA1C in the last 72 hours. Lipid Profile No results for input(s): CHOL, HDL, LDLCALC, TRIG, CHOLHDL, LDLDIRECT in the last 72 hours. Thyroid function studies No results for input(s): TSH, T4TOTAL, T3FREE, THYROIDAB in the last 72 hours.  Invalid input(s): FREET3 Anemia work up No results for input(s): VITAMINB12, FOLATE, FERRITIN, TIBC, IRON, RETICCTPCT in the last 72 hours. Urinalysis    Component Value Date/Time   COLORURINE YELLOW (A) 04/02/2020 0833   APPEARANCEUR CLEAR (A) 04/02/2020 0833   APPEARANCEUR Cloudy (A) 02/27/2020 1406   LABSPEC 1.015 04/02/2020 0833   LABSPEC 1.024 01/03/2013 1856   PHURINE 7.0 04/02/2020 0833   GLUCOSEU NEGATIVE 04/02/2020 0833   GLUCOSEU Negative 01/03/2013 1856   HGBUR NEGATIVE 04/02/2020 0833   BILIRUBINUR NEGATIVE 04/02/2020 0833   BILIRUBINUR Negative 02/27/2020 1406   BILIRUBINUR Negative 01/03/2013 1856   KETONESUR 5 (A) 04/02/2020 0833   PROTEINUR NEGATIVE 04/02/2020 0833   NITRITE NEGATIVE 04/02/2020 0833   LEUKOCYTESUR NEGATIVE 04/02/2020 0833   LEUKOCYTESUR 1+ 01/03/2013 1856    Sepsis Labs Invalid input(s): PROCALCITONIN,  WBC,  LACTICIDVEN Microbiology Recent Results (from the past 240 hour(s))  SARS Coronavirus 2 by RT PCR (hospital order, performed in [facility] hospital lab) Nasopharyngeal Nasopharyngeal Swab     Status: None   Collection Time: 04/02/20  2:26 PM   Specimen: Nasopharyngeal Swab  Result Value Ref Range Status   SARS Coronavirus 2 NEGATIVE NEGATIVE Final    Comment: (NOTE) SARS-CoV-2 target nucleic acids are NOT DETECTED.  The SARS-CoV-2 RNA is generally detectable in upper and lower respiratory specimens during the acute phase of infection. The lowest concentration of SARS-CoV-2 viral copies this assay can detect is 250 copies / mL. A negative result does not preclude SARS-CoV-2 infection and should not be used as the sole basis for treatment or other patient management decisions.  A negative result may occur with improper specimen collection / handling, submission of specimen other than nasopharyngeal swab, presence of viral mutation(s) within the areas targeted by this assay, and inadequate number of viral copies (<250 copies / mL). A negative result must be combined with clinical observations, patient history, and epidemiological information.  Fact Sheet for Patients:   StrictlyIdeas.no  Fact Sheet for Healthcare Providers: BankingDealers.co.za  This test is not yet approved or  cleared by the [place] FDA and has been authorized for detection and/or diagnosis of SARS-CoV-2 by FDA under an Emergency Use Authorization (EUA).  This EUA will remain in effect (meaning this test can be used) for the duration of the COVID-19 declaration under Section 564(b)(1) of the Act, 21 U.S.C. section 360bbb-3(b)(1), unless the authorization is terminated or revoked sooner.  Performed at [facility], [address] [place], [place] [address]      Time coordinating discharge:  32 minutes.   SIGNED:   Hosie Poisson, MD  [facility] 04/04/2020, 12:52 PM

## 2020-04-04 NOTE — TOC Initial Note (Signed)
Transition of Care [facility]) - Initial/Assessment Note    Patient Details  Name: Katrina Page MRN: [id number] Date of Birth: 20-Aug-1981  Transition of Care [facility]) CM/SW Contact:    Beverly Sessions, RN Phone Number: 04/04/2020, 4:02 PM  Clinical Narrative:                 Patient admitted for nausea and vomiting.   Patient states that she lives in her own home, her mother lives next door, and her children share time between both houses  Patient states that she has now established care with [facility] at [facility].  Patient states that she will be picking up her medications from their low cost pharmacy.  RNCM confirmed that all new prescriptions were electronically sent to her pharmacy.   Patient confirms that she has transportation to all of her follow up appointments.  Patient denies any concerns with her discharge, other then she states "I just want to be able to feel better"   Expected Discharge Plan: Home/Self Care Barriers to Discharge: No Barriers Identified   Patient Goals and CMS Choice        Expected Discharge Plan and Services Expected Discharge Plan: Home/Self Care   Discharge Planning Services: CM Consult   Living arrangements for the past 2 months: Single Family Home Expected Discharge Date: 04/04/20                                    Prior Living Arrangements/Services Living arrangements for the past 2 months: Single Family Home Lives with:: Minor Children   Do you feel safe going back to the place where you live?: Yes      Need for Family Participation in Patient Care: No (Comment) Care giver support system in place?: Yes (comment)   Criminal Activity/Legal Involvement Pertinent to Current Situation/Hospitalization: No - Comment as needed  Activities of Daily Living Home Assistive Devices/Equipment: None ADL Screening (condition at time of admission) Patient's cognitive ability adequate to safely complete daily activities?: Yes Is the  patient deaf or have difficulty hearing?: No Does the patient have difficulty seeing, even when wearing glasses/contacts?: No Does the patient have difficulty concentrating, remembering, or making decisions?: No Patient able to express need for assistance with ADLs?: Yes Does the patient have difficulty dressing or bathing?: No Independently performs ADLs?: Yes (appropriate for developmental age) Does the patient have difficulty walking or climbing stairs?: Yes Weakness of Legs: Both Weakness of Arms/Hands: None  Permission Sought/Granted                  Emotional Assessment       Orientation: : Oriented to Self, Oriented to Place, Oriented to  Time, Oriented to Situation      Admission diagnosis:  Dehydration [E86.0] Intractable nausea and vomiting [R11.2] Refractory nausea and vomiting [R11.2] Nausea and vomiting [R11.2] Patient Active Problem List   Diagnosis Date Noted  . Nausea and vomiting 04/03/2020  . Refractory nausea and vomiting 04/02/2020  . SIRS (systemic inflammatory response syndrome) (HCC)   . Nausea   . Intractable vomiting   . Intractable vomiting with nausea 03/24/2020  . AKI (acute kidney injury) ([place]) 03/24/2020  . Anemia 03/24/2020  . Thrombocytopenia ([place]) 02/10/2020  . Intractable nausea and vomiting 02/09/2020  . Hypokalemia due to excessive gastrointestinal loss of potassium   . Hypomagnesemia   . Left adrenal mass ([place])   . Obstructive  uropathy   . Severe recurrent major depression without psychotic features ([place]) 12/29/2019  . Suicide attempt by drug overdose ([place]) 12/29/2019  . Chronic back pain 12/29/2019  . Nicotine abuse 12/29/2019  . MDD (major depressive disorder) 12/28/2019  . MDD (major depressive disorder), severe ([place]) 12/24/2019  . Alcohol abuse 12/24/2019  . Left renal stone 08/25/2019  . Chest pain 07/24/2019  . Dyspnea 07/24/2019  . Tobacco use 07/24/2019  . Pulmonary embolism ([place]) 07/23/2019  . Sepsis ([place])  11/29/2018   PCP:  Patient, No Pcp Per Pharmacy:   [place], [address] [place] [address] Phone: [phone] Fax: [phone]  [place], [place] [address] Phone: [phone] Fax: [phone]  [facility] [place], [address] [place] [address] Phone: [phone] Fax: [place], [place] [address] Phone: [phone] Fax: [phone]     Social Determinants of Health (SDOH) Interventions    Readmission Risk Interventions No flowsheet data found.

## 2020-04-09 ENCOUNTER — Other Ambulatory Visit: Payer: Self-pay

## 2020-04-09 ENCOUNTER — Ambulatory Visit
Admission: RE | Admit: 2020-04-09 | Discharge: 2020-04-09 | Disposition: A | Discharge status: Home / Self Care | Payer: Self-pay | Source: Ambulatory Visit | Attending: Physician Assistant | Admitting: Physician Assistant

## 2020-04-09 DIAGNOSIS — N200 Calculus of kidney: Secondary | ICD-10-CM | POA: Insufficient documentation

## 2020-04-17 ENCOUNTER — Telehealth: Payer: Self-pay | Admitting: Urology

## 2020-04-17 ENCOUNTER — Ambulatory Visit: Payer: Self-pay | Admitting: Urology

## 2020-04-17 NOTE — Telephone Encounter (Signed)
This patient was a no-show today for clinic.  She did go get a renal ultrasound and it looks like her kidneys have healed up nicely since her kidney stone surgery (no residual stones or hydronephrosis).  The primary reason for visit today was to complete her metabolic evaluation for her enlarging adrenal mass which are now pended in today's note.  Please contact her and have her get these labs done.  One of the test is called a dexamethasone suppression test.  I prescribed 1 mg of dexamethasone which she should take at 11 PM and come in and have her blood drawn at 8 AM (dexamethasone level and cortisol) at the following morning.  The other labs can be at any time (but not at the suppression test time).  I would like to see her when she has these labs completed to schedule surgery to have this enlarging mass removed.    It is imperative that she follow-up.  We had discussed this same plan previously during her admission as well as at the time of her kidney stone surgery.  Hollice Espy, MD

## 2020-04-18 NOTE — Telephone Encounter (Signed)
Called patient, got disconnected-left VM to return call.

## 2020-04-20 NOTE — Telephone Encounter (Signed)
2 attempt to contact patient, unable to leave VM.

## 2020-04-30 ENCOUNTER — Encounter: Payer: Self-pay | Admitting: Radiology

## 2020-04-30 ENCOUNTER — Emergency Department
Admission: EM | Admit: 2020-04-30 | Discharge: 2020-04-30 | Disposition: A | Discharge status: Home / Self Care | Payer: Self-pay | Attending: Student in an Organized Health Care Education/Training Program | Admitting: Student in an Organized Health Care Education/Training Program

## 2020-04-30 ENCOUNTER — Other Ambulatory Visit: Payer: Self-pay

## 2020-04-30 ENCOUNTER — Emergency Department: Payer: Self-pay

## 2020-04-30 DIAGNOSIS — R109 Unspecified abdominal pain: Secondary | ICD-10-CM | POA: Insufficient documentation

## 2020-04-30 DIAGNOSIS — F17210 Nicotine dependence, cigarettes, uncomplicated: Secondary | ICD-10-CM | POA: Insufficient documentation

## 2020-04-30 DIAGNOSIS — R112 Nausea with vomiting, unspecified: Secondary | ICD-10-CM | POA: Insufficient documentation

## 2020-04-30 DIAGNOSIS — Z7951 Long term (current) use of inhaled steroids: Secondary | ICD-10-CM | POA: Insufficient documentation

## 2020-04-30 LAB — COMPREHENSIVE METABOLIC PANEL
ALT: 11 U/L (ref 0–44)
AST: 37 U/L (ref 15–41)
Albumin: 4.1 g/dL (ref 3.5–5.0)
Alkaline Phosphatase: 76 U/L (ref 38–126)
Anion gap: 21 — ABNORMAL HIGH (ref 5–15)
BUN: 7 mg/dL (ref 6–20)
CO2: 17 mmol/L — ABNORMAL LOW (ref 22–32)
Calcium: 8.1 mg/dL — ABNORMAL LOW (ref 8.9–10.3)
Chloride: 106 mmol/L (ref 98–111)
Creatinine, Ser: 1.25 mg/dL — ABNORMAL HIGH (ref 0.44–1.00)
GFR calc Af Amer: >60 mL/min (ref >60)
GFR calc non Af Amer: 54 mL/min — ABNORMAL LOW (ref >60)
Glucose, Bld: 156 mg/dL — ABNORMAL HIGH (ref 70–99)
Potassium: 3.3 mmol/L — ABNORMAL LOW (ref 3.5–5.1)
Sodium: 144 mmol/L (ref 135–145)
Total Bilirubin: 1.2 mg/dL (ref 0.3–1.2)
Total Protein: 7.4 g/dL (ref 6.5–8.1)

## 2020-04-30 LAB — URINALYSIS, COMPLETE (UACMP) WITH MICROSCOPIC
Bacteria, UA: NONE SEEN
Bilirubin Urine: NEGATIVE
Glucose, UA: NEGATIVE mg/dL
Hgb urine dipstick: NEGATIVE
Ketones, ur: NEGATIVE mg/dL
Nitrite: NEGATIVE
Protein, ur: 100 mg/dL — AB
Specific Gravity, Urine: 1.025 (ref 1.005–1.030)
pH: 6 (ref 5.0–8.0)

## 2020-04-30 LAB — CBC
HCT: 33.5 % — ABNORMAL LOW (ref 36.0–46.0)
Hemoglobin: 10.7 g/dL — ABNORMAL LOW (ref 12.0–15.0)
MCH: 23.7 pg — ABNORMAL LOW (ref 26.0–34.0)
MCHC: 31.9 g/dL (ref 30.0–36.0)
MCV: 74.3 fL — ABNORMAL LOW (ref 80.0–100.0)
Platelets: 211 10*3/uL (ref 150–400)
RBC: 4.51 MIL/uL (ref 3.87–5.11)
RDW: 21.7 % — ABNORMAL HIGH (ref 11.5–15.5)
WBC: 19.3 10*3/uL — ABNORMAL HIGH (ref 4.0–10.5)
nRBC: 0 % (ref 0.0–0.2)

## 2020-04-30 LAB — HCG, QUANTITATIVE, PREGNANCY: hCG, Beta Chain, Quant, S: <1 m[IU]/mL (ref <5)

## 2020-04-30 LAB — LIPASE, BLOOD: Lipase: 58 U/L — ABNORMAL HIGH (ref 11–51)

## 2020-04-30 LAB — POCT PREGNANCY, URINE: Preg Test, Ur: NEGATIVE

## 2020-04-30 MED ORDER — PROMETHAZINE HCL 25 MG/ML IJ SOLN: 12.5000 mg | Freq: Four times a day (QID) | INTRAMUSCULAR | Status: DC | PRN

## 2020-04-30 MED ORDER — DROPERIDOL 2.5 MG/ML IJ SOLN
2.5000 mg | Freq: Once | INTRAMUSCULAR | Status: AC
  Administered 2020-04-30: 2.5 mg via INTRAVENOUS
  Filled 2020-04-30: qty 2

## 2020-04-30 MED ORDER — IOHEXOL 300 MG/ML  SOLN
100.0000 mL | Freq: Once | INTRAMUSCULAR | Status: AC | PRN
  Administered 2020-04-30: 100 mL via INTRAVENOUS

## 2020-04-30 MED ORDER — PROMETHAZINE HCL 25 MG RE SUPP: 25.0000 mg | Freq: Four times a day (QID) | RECTAL | 1 refills | Status: DC | PRN

## 2020-04-30 MED ORDER — ONDANSETRON HCL 4 MG/2ML IJ SOLN: 4.0000 mg | Freq: Once | INTRAMUSCULAR | Status: AC | PRN

## 2020-04-30 MED ORDER — ONDANSETRON HCL 4 MG/2ML IJ SOLN
4.0000 mg | Freq: Once | INTRAMUSCULAR | Status: AC
  Administered 2020-04-30: 4 mg via INTRAVENOUS
  Filled 2020-04-30: qty 2

## 2020-04-30 MED ORDER — MORPHINE SULFATE (PF) 4 MG/ML IV SOLN
4.0000 mg | INTRAVENOUS | Status: DC | PRN
  Administered 2020-04-30: 4 mg via INTRAVENOUS
  Filled 2020-04-30: qty 1

## 2020-04-30 MED ORDER — ONDANSETRON HCL 4 MG/2ML IJ SOLN
INTRAMUSCULAR | Status: AC
  Administered 2020-04-30: 4 mg via INTRAVENOUS
  Filled 2020-04-30: qty 2

## 2020-04-30 MED ORDER — SODIUM CHLORIDE 0.9 % IV BOLUS
1000.0000 mL | Freq: Once | INTRAVENOUS | Status: AC
  Administered 2020-04-30: 1000 mL via INTRAVENOUS

## 2020-04-30 NOTE — ED Notes (Signed)
Patient up to stat desk requesting something to drink.  Explained to patient that she shouldn't have anything to drink due to emesis that has brought her to the ED.  Patient appears unsteady and placed in wheelchair.

## 2020-04-30 NOTE — ED Notes (Signed)
Patient placed in subwait for comfort, instructed she would have to stay in recliner for safety.

## 2020-04-30 NOTE — ED Notes (Signed)
Patient again observed walking around lobby against staff advise

## 2020-04-30 NOTE — ED Notes (Signed)
Patient instructed remain in wheelchair for safety.

## 2020-04-30 NOTE — ED Notes (Signed)
Patient again observed walking in lobby after being asked several times to stay seated in wheelchair and ask staff for assistance.

## 2020-04-30 NOTE — ED Triage Notes (Addendum)
Pt with nausea and lower abd pain that began today. Pt denies diarrhea. No fever noted. Ems gave 4mg  iv zofran.

## 2020-04-30 NOTE — ED Provider Notes (Signed)
[facility] Emergency Department Provider Note    First MD Initiated Contact with Patient 04/30/20 0848     (approximate)  I have reviewed the triage vital signs and the nursing notes.   HISTORY  Chief Complaint Emesis    HPI Katrina Page is a 39 y.o. female with a history of cyclic vomiting syndrome gastroparesis presents to the ER for nausea vomiting and abdominal pain for past several days.  Has been admitted to the hospital for similar symptoms in the past.  Denies any fevers.  Emesis is nonbilious nonbloody.  No chest pain or shortness of breath.  Patient actively vomiting upon arrival to the ER bed however nursing documentation shows the patient was trying to make purchase from vending machine while in the waiting room.    Past Medical History:  Diagnosis Date  . Adrenal nodule ([place]) 06/2019   left  . Anemia   . Bronchitis   . Depression   . History of kidney stones   . Migraines   . Nodule of right lung 06/2019  . Pulmonary emboli ([place]) 07/23/2019   bilateral  . Thrombocytopenia (HCC)    Family History  Problem Relation Age of Onset  . Hypertension Mother   . Diabetes Mother   . Stroke Mother   . Breast cancer Mother    Past Surgical History:  Procedure Laterality Date  . APPENDECTOMY    . CHOLECYSTECTOMY    . CYSTOSCOPY W/ RETROGRADES Left 03/05/2020   Procedure: CYSTOSCOPY WITH RETROGRADE PYELOGRAM;  Surgeon: Hollice Espy, MD;  Location: [facility] ORS;  Service: Urology;  Laterality: Left;  . CYSTOSCOPY WITH STENT PLACEMENT Left 02/09/2020   Procedure: CYSTOSCOPY WITH STENT PLACEMENT;  Surgeon: Hollice Espy, MD;  Location: [facility] ORS;  Service: Urology;  Laterality: Left;  . CYSTOSCOPY/URETEROSCOPY/HOLMIUM LASER/STENT PLACEMENT Left 03/05/2020   Procedure: CYSTOSCOPY/URETEROSCOPY/HOLMIUM LASER/STENT EXCHANGE;  Surgeon: Hollice Espy, MD;  Location: [facility] ORS;  Service: Urology;  Laterality: Left;  . ESOPHAGOGASTRODUODENOSCOPY (EGD)  WITH PROPOFOL N/A 03/27/2020   Procedure: ESOPHAGOGASTRODUODENOSCOPY (EGD) WITH PROPOFOL;  Surgeon: Lucilla Lame, MD;  Location: [facility] ENDOSCOPY;  Service: Endoscopy;  Laterality: N/A;  . TUBAL LIGATION     Patient Active Problem List   Diagnosis Date Noted  . Nausea and vomiting 04/03/2020  . Refractory nausea and vomiting 04/02/2020  . SIRS (systemic inflammatory response syndrome) (HCC)   . Nausea   . Intractable vomiting   . Intractable vomiting with nausea 03/24/2020  . AKI (acute kidney injury) ([place]) 03/24/2020  . Anemia 03/24/2020  . Thrombocytopenia ([place]) 02/10/2020  . Intractable nausea and vomiting 02/09/2020  . Hypokalemia due to excessive gastrointestinal loss of potassium   . Hypomagnesemia   . Left adrenal mass ([place])   . Obstructive uropathy   . Severe recurrent major depression without psychotic features ([place]) 12/29/2019  . Suicide attempt by drug overdose ([place]) 12/29/2019  . Chronic back pain 12/29/2019  . Nicotine abuse 12/29/2019  . MDD (major depressive disorder) 12/28/2019  . MDD (major depressive disorder), severe ([place]) 12/24/2019  . Alcohol abuse 12/24/2019  . Left renal stone 08/25/2019  . Chest pain 07/24/2019  . Dyspnea 07/24/2019  . Tobacco use 07/24/2019  . Pulmonary embolism ([place]) 07/23/2019  . Sepsis ([place]) 11/29/2018      Prior to Admission medications   Medication Sig Start Date End Date Taking? Authorizing Provider  albuterol (VENTOLIN HFA) 108 (90 Base) MCG/ACT inhaler Inhale 1-2 puffs into the lungs every 4 (four) hours as needed for wheezing or  shortness of breath. 01/02/20  Yes Clapacs, Madie Reno, MD  Folic Acid-Vit O1-HYQ M57 0.4-50-0.1 MG TABS SMARTSIG:1 Tablet(s) By Mouth Daily 03/29/20  Yes [provider]  hydrOXYzine (ATARAX/VISTARIL) 50 MG tablet Take 1 tablet (50 mg total) by mouth 3 (three) times daily as needed for anxiety. 03/27/20  Yes Fritzi Mandes, MD  metoCLOPramide (REGLAN) 5 MG tablet Take 1 tablet (5 mg total) by mouth 3  (three) times daily before meals. 04/04/20  Yes Hosie Poisson, MD  oxybutynin (DITROPAN) 5 MG tablet Take 1 tablet (5 mg total) by mouth every 8 (eight) hours as needed for bladder spasms. 02/12/20  Yes Lorella Nimrod, MD  pantoprazole (PROTONIX) 40 MG tablet Take 1 tablet (40 mg total) by mouth daily. 03/27/20  Yes Fritzi Mandes, MD  Potassium Chloride ER 20 MEQ TBCR Take 20 mEq by mouth daily.  03/29/20  Yes [provider]  venlafaxine XR (EFFEXOR-XR) 75 MG 24 hr capsule Take 1 capsule (75 mg total) by mouth daily with breakfast. Patient taking differently: Take 150 mg by mouth daily with breakfast.  03/27/20  Yes Fritzi Mandes, MD  vitamin B-12 1000 MCG tablet Take 1 tablet (1,000 mcg total) by mouth daily. 02/13/20  Yes Lorella Nimrod, MD  promethazine (PHENERGAN) 25 MG suppository Place 1 suppository (25 mg total) rectally every 6 (six) hours as needed for nausea. 04/30/20 04/30/21  Merlyn Lot, MD    Allergies Patient has no known allergies.    Social History Social History   Tobacco Use  . Smoking status: Current Every Day Smoker    Packs/day: 0.25    Years: 20.00    Pack years: 5.00    Types: Cigarettes  . Smokeless tobacco: Never Used  Vaping Use  . Vaping Use: Never used  Substance Use Topics  . Alcohol use: Yes    Alcohol/week: 6.0 standard drinks    Types: 6 Shots of liquor per week  . Drug use: No    Review of Systems Patient denies headaches, rhinorrhea, blurry vision, numbness, shortness of breath, chest pain, edema, cough, abdominal pain, nausea, vomiting, diarrhea, dysuria, fevers, rashes or hallucinations unless otherwise stated above in HPI. ____________________________________________   PHYSICAL EXAM:  VITAL SIGNS: Vitals:   04/30/20 0223 04/30/20 0805  BP: (!) 170/97 (!) 164/95  Pulse: 90 93  Resp: 20 18  Temp: 98 F (36.7 C)   SpO2: 100% 99%    Constitutional: Alert and oriented.  Eyes: Conjunctivae are normal.  Head: Atraumatic. Nose: No  congestion/rhinnorhea. Mouth/Throat: Mucous membranes are moist.   Neck: No stridor. Painless ROM.  Cardiovascular: Normal rate, regular rhythm. Grossly normal heart sounds.  Good peripheral circulation. Respiratory: Normal respiratory effort.  No retractions. Lungs CTAB. Gastrointestinal: obese, mild ttp, no rebound or guarding. No distention. No abdominal bruits. No CVA tenderness. Genitourinary:  Musculoskeletal: No lower extremity tenderness nor edema.  No joint effusions. Neurologic:  Normal speech and language. No gross focal neurologic deficits are appreciated. No facial droop Skin:  Skin is warm, dry and intact. No rash noted. Psychiatric: Mood and affect are normal. Speech and behavior are normal.  ____________________________________________   LABS (all labs ordered are listed, but only abnormal results are displayed)  Results for orders placed or performed during the hospital encounter of 04/30/20 (from the past 24 hour(s))  Comprehensive metabolic panel     Status: Abnormal   Collection Time: 04/30/20  2:29 AM  Result Value Ref Range   Sodium 144 135 - 145 mmol/L   Potassium  3.3 (L) 3.5 - 5.1 mmol/L   Chloride 106 98 - 111 mmol/L   CO2 17 (L) 22 - 32 mmol/L   Glucose, Bld 156 (H) 70 - 99 mg/dL   BUN 7 6 - 20 mg/dL   Creatinine, Ser 1.25 (H) 0.44 - 1.00 mg/dL   Calcium 8.1 (L) 8.9 - 10.3 mg/dL   Total Protein 7.4 6.5 - 8.1 g/dL   Albumin 4.1 3.5 - 5.0 g/dL   AST 37 15 - 41 U/L   ALT 11 0 - 44 U/L   Alkaline Phosphatase 76 38 - 126 U/L   Total Bilirubin 1.2 0.3 - 1.2 mg/dL   GFR calc non Af Amer 54 (L) >60 mL/min   GFR calc Af Amer >60 >60 mL/min   Anion gap 21 (H) 5 - 15  CBC     Status: Abnormal   Collection Time: 04/30/20  2:29 AM  Result Value Ref Range   WBC 19.3 (H) 4.0 - 10.5 K/uL   RBC 4.51 3.87 - 5.11 MIL/uL   Hemoglobin 10.7 (L) 12.0 - 15.0 g/dL   HCT 33.5 (L) 36 - 46 %   MCV 74.3 (L) 80.0 - 100.0 fL   MCH 23.7 (L) 26.0 - 34.0 pg   MCHC 31.9 30.0 -  36.0 g/dL   RDW 21.7 (H) 11.5 - 15.5 %   Platelets 211 150 - 400 K/uL   nRBC 0.0 0.0 - 0.2 %  Lipase, blood     Status: Abnormal   Collection Time: 04/30/20  2:32 AM  Result Value Ref Range   Lipase 58 (H) 11 - 51 U/L  hCG, quantitative, pregnancy     Status: None   Collection Time: 04/30/20  2:32 AM  Result Value Ref Range   hCG, Beta Chain, Quant, S <1 <5 mIU/mL  Pregnancy, urine POC     Status: None   Collection Time: 04/30/20 10:13 AM  Result Value Ref Range   Preg Test, Ur NEGATIVE NEGATIVE  Urinalysis, Complete w Microscopic Urine, Random     Status: Abnormal   Collection Time: 04/30/20 10:15 AM  Result Value Ref Range   Color, Urine AMBER (A) YELLOW   APPearance CLOUDY (A) CLEAR   Specific Gravity, Urine 1.025 1.005 - 1.030   pH 6.0 5.0 - 8.0   Glucose, UA NEGATIVE NEGATIVE mg/dL   Hgb urine dipstick NEGATIVE NEGATIVE   Bilirubin Urine NEGATIVE NEGATIVE   Ketones, ur NEGATIVE NEGATIVE mg/dL   Protein, ur 100 (A) NEGATIVE mg/dL   Nitrite NEGATIVE NEGATIVE   Leukocytes,Ua TRACE (A) NEGATIVE   RBC / HPF 0-5 0 - 5 RBC/hpf   WBC, UA 0-5 0 - 5 WBC/hpf   Bacteria, UA NONE SEEN NONE SEEN   Squamous Epithelial / LPF 6-10 0 - 5   Mucus PRESENT    ____________________________________________  EKG My review and personal interpretation at Time: 9:14   Indication: n/v  Rate: 80  Rhythm: sinus Axis: normal Other: normal intervals, no stemi, no depressions ____________________________________________  RADIOLOGY  I personally reviewed all radiographic images ordered to evaluate for the above acute complaints and reviewed radiology reports and findings.  These findings were personally discussed with the patient.  Please see medical record for radiology report.  ____________________________________________   PROCEDURES  Procedure(s) performed:  Procedures    Critical Care performed: no ____________________________________________   INITIAL IMPRESSION / ASSESSMENT AND  PLAN / ED COURSE  Pertinent labs & imaging results that were available during my care of the  patient were reviewed by me and considered in my medical decision making (see chart for details).   DDX: Enteritis, gastritis, appendicitis, colitis, diverticulitis, SBO, cyclic vomiting syndrome, withdrawal  Katrina Page is a 39 y.o. who presents to the ED with symptoms as described above.  Has been seen in his facility for similar symptoms in the past.  Does appear dehydrated therefore will give IV fluids as well as IV antiemetic.  Does appear anxious as well.  Does have some generalized abdominal pain no focal guarding or rebound tenderness but given her white count will order CT abdomen to evaluate for acute intra-abdominal process.  Patient complains any chest pain or shortness of breath.  The patient will be placed on continuous pulse oximetry and telemetry for monitoring.  Laboratory evaluation will be sent to evaluate for the above complaints.     Clinical Course as of Apr 30 1058  Mon Apr 30, 2020  0854 GFR, Est Non African American(!): 54 [PR]  1052 CT imaging is reassuring.  White count likely secondary to frequent vomiting.  No signs or symptoms of Boerhaave's.  She otherwise nontoxic-appearing.  Improving with IV fluids.  IV antiemetic significantly helped.  Will p.o. challenge and if she passes anticipate patient will be discharged home.   [PR]    Clinical Course User Index [PR] Merlyn Lot, MD    The patient was evaluated in Emergency Department today for the symptoms described in the history of present illness. He/she was evaluated in the context of the global COVID-19 pandemic, which necessitated consideration that the patient might be at risk for infection with the SARS-CoV-2 virus that causes COVID-19. Institutional protocols and algorithms that pertain to the evaluation of patients at risk for COVID-19 are in a state of rapid change based on information released by regulatory  bodies including the CDC and federal and state organizations. These policies and algorithms were followed during the patient's care in the ED.  As part of my medical decision making, I reviewed the following data within the [facility] notes reviewed and incorporated, Labs reviewed, notes from prior ED visits and [place] Controlled Substance Database   ____________________________________________   FINAL CLINICAL IMPRESSION(S) / ED DIAGNOSES  Final diagnoses:  Non-intractable vomiting with nausea, unspecified vomiting type      NEW MEDICATIONS STARTED DURING THIS VISIT:  New Prescriptions   PROMETHAZINE (PHENERGAN) 25 MG SUPPOSITORY    Place 1 suppository (25 mg total) rectally every 6 (six) hours as needed for nausea.     Note:  This document was prepared using Dragon voice recognition software and may include unintentional dictation errors.    Merlyn Lot, MD 04/30/20 1100

## 2020-04-30 NOTE — ED Notes (Signed)
Patient observed walking towards vending machines.

## 2020-04-30 NOTE — ED Notes (Signed)
Patient again observed walking around lobby without asking for assistance.

## 2020-04-30 NOTE — ED Notes (Signed)
Pt from home via ems with emesis and abdominal/back pain since yesterday. Pt alert & oriented, inapsine seems to be helping with nausea, retching stopped at this time.

## 2020-05-21 NOTE — Progress Notes (Signed)
05/23/2020 11:04 AM   Katrina Page [birth year] [id number]  Referring provider: [facility] [address] [place],  [place] [address] Chief Complaint  Patient presents with   Follow-up    HPI: Katrina Page is a 39 y.o. female returns for follow up post op left ureteroscopy with laser lithotripsy as well as to address enlarging left adrenal mass.    She initially presented with a large obstructing 1.4 cm left renal pelvic stone which was infected.  She underwent ureteral stent placement and return for definitive ureteroscopy on 03/05/2020.  This procedure was uncomplicated.  RUS on 04/09/2020 revealed no significant abnormality. No sonographically visible calculi or hydronephrosis.  She is failed to follow-up until now because she has been admitted several times for upper GI complaints including cyclic vomiting.  Patient was admitted to [facility] from 04/02/2020 to 04/04/2020 persistent nausea, vomiting and abdominal pain. She was seen in the ED on 04/30/2020 for emesis. Denied any fevers. Emesis ws nonbilious nonbloody. No chest pain or shortness of breath. Patient received IV fluids. The patient was placed on continuous pulse oximetry and telemetry for monitoring.  CT A/P w/ contrast on 04/30/2020 revealed no acute findings in the abdomen or pelvis. Specifically, no findings to explain the patient's history of nausea and vomiting. Bilateral nephrolithiasis. Stable right adrenal nodule and left adrenal mass, both containing macroscopic fat consistent with adenomas. No hypermetabolism on PET-CT of 08/23/2019. Uterine fibroids.  She also has a an enlarging left adrenal mass.  Was noted initial consultation by Dr. Bernardo Heater as outpatient and had intervally enlarged on her most recent hospitalization greater than 6 cm.  She was referred to endocrinology for metabolic evaluation but never followed through with this.  She reports doing much better since  admission. Denies emesis and nausea.    PMH: Past Medical History:  Diagnosis Date   Adrenal nodule ([place]) 06/2019   left   Anemia    Bronchitis    Depression    History of kidney stones    Migraines    Nodule of right lung 06/2019   Pulmonary emboli ([place]) 07/23/2019   bilateral   Thrombocytopenia ([place])     Surgical History: Past Surgical History:  Procedure Laterality Date   APPENDECTOMY     CHOLECYSTECTOMY     CYSTOSCOPY W/ RETROGRADES Left 03/05/2020   Procedure: CYSTOSCOPY WITH RETROGRADE PYELOGRAM;  Surgeon: Hollice Espy, MD;  Location: [facility] ORS;  Service: Urology;  Laterality: Left;   CYSTOSCOPY WITH STENT PLACEMENT Left 02/09/2020   Procedure: CYSTOSCOPY WITH STENT PLACEMENT;  Surgeon: Hollice Espy, MD;  Location: [facility] ORS;  Service: Urology;  Laterality: Left;   CYSTOSCOPY/URETEROSCOPY/HOLMIUM LASER/STENT PLACEMENT Left 03/05/2020   Procedure: CYSTOSCOPY/URETEROSCOPY/HOLMIUM LASER/STENT EXCHANGE;  Surgeon: Hollice Espy, MD;  Location: [facility] ORS;  Service: Urology;  Laterality: Left;   ESOPHAGOGASTRODUODENOSCOPY (EGD) WITH PROPOFOL N/A 03/27/2020   Procedure: ESOPHAGOGASTRODUODENOSCOPY (EGD) WITH PROPOFOL;  Surgeon: Lucilla Lame, MD;  Location: [facility] ENDOSCOPY;  Service: Endoscopy;  Laterality: N/A;   TUBAL LIGATION      Home Medications:  Allergies as of 05/23/2020   No Known Allergies     Medication List       Accurate as of May 23, 2020 11:59 PM. If you have any questions, ask your nurse or doctor.        STOP taking these medications   metoCLOPramide 5 MG tablet Commonly known as: REGLAN Stopped by: Hollice Espy, MD     TAKE these medications  albuterol 108 (90 Base) MCG/ACT inhaler Commonly known as: VENTOLIN HFA Inhale 1-2 puffs into the lungs every 4 (four) hours as needed for wheezing or shortness of breath.   cyanocobalamin 1000 MCG tablet Take 1 tablet (1,000 mcg total) by mouth daily.   dexamethasone 1 MG  tablet Commonly known as: DECADRON Take 1 tablet (1 mg total) by mouth once for 1 dose. Take at 11 pm then come in for blood work the next morning at 8 am Started by: Hollice Espy, MD   Folic Acid-Vit U4-QIH K74 0.4-50-0.1 MG Tabs SMARTSIG:1 Tablet(s) By Mouth Daily   hydrOXYzine 50 MG tablet Commonly known as: ATARAX/VISTARIL Take 1 tablet (50 mg total) by mouth 3 (three) times daily as needed for anxiety.   oxybutynin 5 MG tablet Commonly known as: DITROPAN Take 1 tablet (5 mg total) by mouth every 8 (eight) hours as needed for bladder spasms.   pantoprazole 40 MG tablet Commonly known as: PROTONIX Take 1 tablet (40 mg total) by mouth daily.   Potassium Chloride ER 20 MEQ Tbcr Take 20 mEq by mouth daily.   promethazine 25 MG suppository Commonly known as: Phenergan Place 1 suppository (25 mg total) rectally every 6 (six) hours as needed for nausea.   Venlafaxine HCl 150 MG Tb24 Take 1 tablet by mouth daily. What changed: Another medication with the same name was removed. Continue taking this medication, and follow the directions you see here. Changed by: Hollice Espy, MD       Allergies: No Known Allergies  Family History: Family History  Problem Relation Age of Onset   Hypertension Mother    Diabetes Mother    Stroke Mother    Breast cancer Mother     Social History:  reports that she has been smoking cigarettes. She has a 5.00 pack-year smoking history. She has never used smokeless tobacco. She reports current alcohol use of about 6.0 standard drinks of alcohol per week. She reports that she does not use drugs.   Physical Exam: BP 127/84    Pulse (!) 114    LMP  (LMP Unknown)   Constitutional:  Alert and oriented, No acute distress.  Strong cannabis odor. HEENT: [place] AT, moist mucus membranes.  Trachea midline, no masses. Cardiovascular: No clubbing, cyanosis, or edema. Respiratory: Normal respiratory effort, no increased work of breathing. Skin: No  rashes, bruises or suspicious lesions. Neurologic: Grossly intact, no focal deficits, moving all 4 extremities. Psychiatric: Normal mood and affect.  Laboratory Data:  Lab Results  Component Value Date   CREATININE 1.25 (H) 04/30/2020    Lab Results  Component Value Date   HGBA1C 5.2 02/10/2020     Pertinent Imaging:  CLINICAL DATA:  Nausea and vomiting with lower abdominal pain. Leukocytosis.  EXAM: CT ABDOMEN AND PELVIS WITH CONTRAST  TECHNIQUE: Multidetector CT imaging of the abdomen and pelvis was performed using the standard protocol following bolus administration of intravenous contrast.  CONTRAST:  187mL OMNIPAQUE IOHEXOL 300 MG/ML  SOLN  COMPARISON:  03/24/2020  FINDINGS: Lower chest: Unremarkable.  Hepatobiliary: No suspicious focal abnormality within the liver parenchyma. Gallbladder is surgically absent. No intrahepatic or extrahepatic biliary dilation.  Pancreas: No focal mass lesion. No dilatation of the main duct. No intraparenchymal cyst. No peripancreatic edema.  Spleen: No splenomegaly. No focal mass lesion.  Adrenals/Urinary Tract: 1.7 x 1.4 cm right adrenal nodule is stable and compatible with adenoma or myelolipoma. This lesion was previously evaluated as PET-CT without hypermetabolism. Left adrenal mass is stable at  5.8 x 4.2 cm today compared to 5.9 x 4.3 cm previously. This lesion was also assessed by PET-CT on 08/23/2019. Tiny nonobstructing right renal stones evident without right ureteral stone disease. No secondary changes in the right kidney or ureter. Cortical scarring noted left kidney, similar to prior study with tiny nonobstructing stones evident. Ill definition of the left renal pelvis is stable. No evidence for left ureteral stone. No bladder stone.  Stomach/Bowel: Stomach is unremarkable. No gastric wall thickening. No evidence of outlet obstruction. Duodenum is normally positioned as is the ligament of Treitz.  No small bowel wall thickening. No small bowel dilatation. The terminal ileum is normal. The appendix is normal. No gross colonic mass. No colonic wall thickening.  Vascular/Lymphatic: No abdominal aortic aneurysm. There is no gastrohepatic or hepatoduodenal ligament lymphadenopathy. No retroperitoneal or mesenteric lymphadenopathy. No pelvic sidewall lymphadenopathy.  Reproductive: Uterine fibroids evident.  There is no adnexal mass.  Other: No intraperitoneal free fluid.  Musculoskeletal: No worrisome lytic or sclerotic osseous abnormality.  IMPRESSION: 1. No acute findings in the abdomen or pelvis. Specifically, no findings to explain the patient's history of nausea and vomiting. 2. Bilateral nephrolithiasis. 3. Stable right adrenal nodule and left adrenal mass, both containing macroscopic fat consistent with adenomas. No hypermetabolism on PET-CT of 08/23/2019. 4. Uterine fibroids.   Electronically Signed   By: Misty Stanley M.D.   On: 04/30/2020 10:46  I have personally reviewed the images and agree with radiologist interpretation.     Assessment & Plan:    1. Left ureteral calculus S/p left ureteroscopy and retrograde pyelogram in 02/2020.  CT A/P w/ contrast on 04/30/2020 revealed no acute findings.  No additional stone burden Reviewed stone diet  2. Adrenal mass Recent imaging showed stable right adrenal nodule and left adrenal mass. There has been interval growth of the left adrenal mass however more recently stabilized Needs metabolic evaluation including dexamethasone suppression test Eventual left adrenalectomy.  -Will draw aldosterone + renin activity w/ ratio, cortisol, dexamethasone, DHEA-sulfate (dexamethasone suppression test)  Follow-up to review metabolic evaluation  [facility] [address], [place], [place] [address] [phone]  I, Selena Batten, am acting as a scribe for Dr. Hollice Espy.  I have reviewed the above documentation for accuracy and completeness, and I agree with the above.   Hollice Espy, MD  I have reviewed the above documentation for accuracy and completeness, and I agree with the above.   Hollice Espy, MD  I spent 30 total minutes on the day of the encounter including pre-visit review of the medical record, face-to-face time with the patient, and post visit ordering of labs/imaging/tests.

## 2020-05-23 ENCOUNTER — Ambulatory Visit (INDEPENDENT_AMBULATORY_CARE_PROVIDER_SITE_OTHER): Payer: Self-pay | Admitting: Urology

## 2020-05-23 ENCOUNTER — Other Ambulatory Visit: Payer: Self-pay

## 2020-05-23 VITALS — BP 127/84 | HR 114

## 2020-05-23 DIAGNOSIS — E278 Other specified disorders of adrenal gland: Secondary | ICD-10-CM

## 2020-05-23 DIAGNOSIS — N200 Calculus of kidney: Secondary | ICD-10-CM

## 2020-05-23 MED ORDER — DEXAMETHASONE 1 MG PO TABS
1.0000 mg | ORAL_TABLET | Freq: Once | ORAL | 0 refills | Status: AC
Start: 2020-05-23 — End: 2020-05-23

## 2020-05-23 NOTE — Patient Instructions (Signed)
Come back for Lab draw the day after you take Dexamethasone at 11pm. Labs need to be drawn at 8am next day

## 2020-05-24 ENCOUNTER — Other Ambulatory Visit: Payer: Self-pay

## 2020-05-24 ENCOUNTER — Telehealth: Payer: Self-pay | Admitting: Pharmacist

## 2020-05-24 DIAGNOSIS — E278 Other specified disorders of adrenal gland: Secondary | ICD-10-CM

## 2020-05-24 NOTE — Telephone Encounter (Signed)
Patient failed to provide requested 2021 financial documentation. No additional medication assistance will be provided by [facility] without the required proof of income documentation. Patient notified by letter Debra Cheek Administrative Assistant [facility] 

## 2020-05-30 ENCOUNTER — Ambulatory Visit: Payer: Self-pay | Admitting: Urology

## 2020-05-30 ENCOUNTER — Encounter: Payer: Self-pay | Admitting: Urology

## 2020-05-30 LAB — DEXAMETHASONE, BLOOD: Dexamethasone, Blood: 245 ng/dL

## 2020-05-30 LAB — CORTISOL: Cortisol: 2.3 ug/dL

## 2020-05-31 LAB — ALDOSTERONE + RENIN ACTIVITY W/ RATIO
ALDOS/RENIN RATIO: 4.8 (ref 0.0–30.0)
ALDOSTERONE: 4.4 ng/dL (ref 0.0–30.0)
Renin: 0.925 ng/mL/hr (ref 0.167–5.380)

## 2020-05-31 LAB — DHEA-SULFATE: DHEA-SO4: 46 ug/dL — ABNORMAL LOW (ref 57.3–279.2)

## 2020-06-03 NOTE — Progress Notes (Signed)
Katrina Page  [phone] [phone]  ID: Katrina Page OB: [birth year]  MR#: [id number]  SWN#:[id number]  Patient Care Team: Page, Katrina as PCP - General (General Practice) Lloyd Huger, MD as Consulting Physician (Oncology)  CHIEF COMPLAINT: Anemia, unspecified.  INTERVAL HISTORY: Patient returns to clinic today for repeat laboratory work and further evaluation. She continues to have chronic pain, but otherwise feels well.  She does not complain of weakness or fatigue today. She has no neurologic complaints.  She denies any recent fevers or illnesses.  She has a good appetite and denies weight loss.  She denies any chest pain, shortness of breath, cough, or hemoptysis. She has no nausea, vomiting, constipation, or diarrhea.  She has no melena or hematochezia. She has no urinary complaints.  Patient offers no further specific complaints today.   REVIEW OF SYSTEMS:   Review of Systems  Constitutional: Negative.  Negative for fever, malaise/fatigue and weight loss.  Respiratory: Negative.  Negative for cough, hemoptysis and shortness of breath.   Cardiovascular: Negative.  Negative for chest pain and leg swelling.  Gastrointestinal: Negative.  Negative for abdominal pain.  Genitourinary: Positive for flank pain. Negative for dysuria and hematuria.  Musculoskeletal: Positive for back pain and joint pain. Negative for myalgias.  Skin: Negative.  Negative for rash.  Neurological: Negative.  Negative for dizziness, focal weakness, weakness and headaches.  Psychiatric/Behavioral: Negative.  The patient is not nervous/anxious.     As per HPI. Otherwise, a complete review of systems is negative.  PAST MEDICAL HISTORY: Past Medical History:  Diagnosis Date  . Adrenal nodule ([place]) 06/2019   left  . Anemia   . Bronchitis   . Depression   . History of kidney stones   . Migraines   . Nodule of right lung 06/2019  .  Pulmonary emboli ([place]) 07/23/2019   bilateral  . Thrombocytopenia ([place])     PAST SURGICAL HISTORY: Past Surgical History:  Procedure Laterality Date  . APPENDECTOMY    . CHOLECYSTECTOMY    . CYSTOSCOPY W/ RETROGRADES Left 03/05/2020   Procedure: CYSTOSCOPY WITH RETROGRADE PYELOGRAM;  Surgeon: Hollice Espy, MD;  Location: [facility];  Service: Urology;  Laterality: Left;  . CYSTOSCOPY WITH STENT PLACEMENT Left 02/09/2020   Procedure: CYSTOSCOPY WITH STENT PLACEMENT;  Surgeon: Hollice Espy, MD;  Location: [facility];  Service: Urology;  Laterality: Left;  . CYSTOSCOPY/URETEROSCOPY/HOLMIUM LASER/STENT PLACEMENT Left 03/05/2020   Procedure: CYSTOSCOPY/URETEROSCOPY/HOLMIUM LASER/STENT EXCHANGE;  Surgeon: Hollice Espy, MD;  Location: [facility];  Service: Urology;  Laterality: Left;  . ESOPHAGOGASTRODUODENOSCOPY (EGD) WITH PROPOFOL N/A 03/27/2020   Procedure: ESOPHAGOGASTRODUODENOSCOPY (EGD) WITH PROPOFOL;  Surgeon: Lucilla Lame, MD;  Location: [facility];  Service: Endoscopy;  Laterality: N/A;  . TUBAL LIGATION      FAMILY HISTORY: Family History  Problem Relation Age of Onset  . Hypertension Mother   . Diabetes Mother   . Stroke Mother   . Breast cancer Mother     ADVANCED DIRECTIVES (Y/N):  N  HEALTH MAINTENANCE: Social History   Tobacco Use  . Smoking status: Current Every Day Smoker    Packs/day: 0.25    Years: 20.00    Pack years: 5.00    Types: Cigarettes  . Smokeless tobacco: Never Used  Vaping Use  . Vaping Use: Never used  Substance Use Topics  . Alcohol use: Yes    Alcohol/week: 6.0 standard drinks    Types: 6 Shots of liquor per week  . Drug  use: No     Colonoscopy:  PAP:  Bone density:  Lipid panel:  No Known Allergies  Current Outpatient Medications  Medication Sig Dispense Refill  . albuterol (VENTOLIN HFA) 108 (90 Base) MCG/ACT inhaler Inhale 1-2 puffs into the lungs every 4 (four) hours as needed for wheezing or shortness of breath. 6.7 g 1  .  Folic Acid-Vit Z6-[id number] X32 0.4-50-0.1 MG TABS SMARTSIG:1 Tablet(s) By Mouth Daily    . hydrOXYzine (ATARAX/VISTARIL) 50 MG tablet Take 1 tablet (50 mg total) by mouth 3 (three) times daily as needed for anxiety. 20 tablet 0  . oxybutynin (DITROPAN) 5 MG tablet Take 1 tablet (5 mg total) by mouth every 8 (eight) hours as needed for bladder spasms. 30 tablet 0  . pantoprazole (PROTONIX) 40 MG tablet Take 1 tablet (40 mg total) by mouth daily. 30 tablet 0  . Potassium Chloride ER 20 MEQ TBCR Take 20 mEq by mouth daily.     . promethazine (PHENERGAN) 25 MG suppository Place 1 suppository (25 mg total) rectally every 6 (six) hours as needed for nausea. 12 suppository 1  . Venlafaxine HCl 150 MG TB24 Take 1 tablet by mouth daily.    . vitamin B-12 1000 MCG tablet Take 1 tablet (1,000 mcg total) by mouth daily. 90 tablet 1   No current facility-administered medications for this visit.    OBJECTIVE: Vitals:   06/08/20 1353  BP: 136/83  Pulse: 77  Resp: 20  Temp: 98.6 F (37 C)  SpO2: 98%     Body mass index is 29.3 kg/m.    ECOG FS:0 - Asymptomatic  General: Well-developed, well-nourished, no acute distress. Eyes: Pink conjunctiva, anicteric sclera. HEENT: Normocephalic, moist mucous membranes. Lungs: No audible wheezing or coughing. Heart: Regular rate and rhythm. Abdomen: Soft, nontender, no obvious distention. Musculoskeletal: No edema, cyanosis, or clubbing. Neuro: Alert, answering all questions appropriately. Cranial nerves grossly intact. Skin: No rashes or petechiae noted. Psych: Normal affect.  LAB RESULTS:  Lab Results  Component Value Date   NA 144 04/30/2020   K 3.3 (L) 04/30/2020   CL 106 04/30/2020   CO2 17 (L) 04/30/2020   GLUCOSE 156 (H) 04/30/2020   BUN 7 04/30/2020   CREATININE 1.25 (H) 04/30/2020   CALCIUM 8.1 (L) 04/30/2020   PROT 7.4 04/30/2020   ALBUMIN 4.1 04/30/2020   AST 37 04/30/2020   ALT 11 04/30/2020   ALKPHOS 76 04/30/2020   BILITOT 1.2  04/30/2020   GFRNONAA 54 (L) 04/30/2020   GFRAA >60 04/30/2020    Lab Results  Component Value Date   WBC 6.9 06/08/2020   NEUTROABS 5.3 06/08/2020   HGB 10.2 (L) 06/08/2020   HCT 34.1 (L) 06/08/2020   MCV 77.9 (L) 06/08/2020   PLT 164 06/08/2020     STUDIES: No results found.  ASSESSMENT: Anemia, unspecified.  PLAN:    1.  Anemia: Patient's hemoglobin has trended down slightly to 10.2.  No intervention is needed.  Continue to monitor closely.  We will draw iron stores with next laboratory draw.  Return to clinic in 6 months with repeat laboratory work and further evaluation. 2.  Bilateral pulmonary embolism: Diagnosed incidentally on July 23, 2019.  Patient did not appear to have any transient risk factors, although obesity, tobacco use, and sedentary lifestyle do increase her risk.  Full hypercoagulable work-up is negative.  Patient has now completed 6 months of Xarelto and this has been discontinued.  3. Right lung nodule: CT scan results from July 23, 2019 reviewed independently with a 1.8 cm lesion in the right anterior lung that was not present on imaging in March 2020.  PET scan results from August 23, 2019 reviewed independently with complete resolution of nodule and no other suspicious lesions.  Left adrenal nodule is not FDG avid and likely a benign adrenal adenoma.  No further imaging is necessary at this time. 4.  Kidney stones: Patient has a stent in place.  Continue follow-up with urology as indicated. 5.  Pain: Chronic and unchanged.  Patient expressed understanding and was in agreement with this plan. She also understands that She can call clinic at any time with any questions, concerns, or complaints.    Lloyd Huger, MD   06/10/2020 7:02 AM

## 2020-06-07 ENCOUNTER — Other Ambulatory Visit: Payer: Self-pay | Admitting: *Deleted

## 2020-06-07 DIAGNOSIS — D696 Thrombocytopenia, unspecified: Secondary | ICD-10-CM

## 2020-06-08 ENCOUNTER — Other Ambulatory Visit: Payer: Self-pay

## 2020-06-08 ENCOUNTER — Inpatient Hospital Stay (HOSPITAL_BASED_OUTPATIENT_CLINIC_OR_DEPARTMENT_OTHER): Payer: Self-pay | Admitting: Oncology

## 2020-06-08 ENCOUNTER — Encounter: Payer: Self-pay | Admitting: Oncology

## 2020-06-08 ENCOUNTER — Inpatient Hospital Stay: Payer: Self-pay | Attending: Oncology

## 2020-06-08 VITALS — BP 136/83 | HR 77 | Temp 98.6°F | Resp 20 | Wt 198.4 lb

## 2020-06-08 DIAGNOSIS — Z87442 Personal history of urinary calculi: Secondary | ICD-10-CM | POA: Insufficient documentation

## 2020-06-08 DIAGNOSIS — Z803 Family history of malignant neoplasm of breast: Secondary | ICD-10-CM | POA: Insufficient documentation

## 2020-06-08 DIAGNOSIS — D649 Anemia, unspecified: Secondary | ICD-10-CM | POA: Insufficient documentation

## 2020-06-08 DIAGNOSIS — F17210 Nicotine dependence, cigarettes, uncomplicated: Secondary | ICD-10-CM | POA: Insufficient documentation

## 2020-06-08 DIAGNOSIS — Z8249 Family history of ischemic heart disease and other diseases of the circulatory system: Secondary | ICD-10-CM | POA: Insufficient documentation

## 2020-06-08 DIAGNOSIS — D696 Thrombocytopenia, unspecified: Secondary | ICD-10-CM

## 2020-06-08 DIAGNOSIS — I2694 Multiple subsegmental pulmonary emboli without acute cor pulmonale: Secondary | ICD-10-CM

## 2020-06-08 DIAGNOSIS — F329 Major depressive disorder, single episode, unspecified: Secondary | ICD-10-CM | POA: Insufficient documentation

## 2020-06-08 DIAGNOSIS — Z86711 Personal history of pulmonary embolism: Secondary | ICD-10-CM | POA: Insufficient documentation

## 2020-06-08 DIAGNOSIS — Z833 Family history of diabetes mellitus: Secondary | ICD-10-CM | POA: Insufficient documentation

## 2020-06-08 DIAGNOSIS — Z79899 Other long term (current) drug therapy: Secondary | ICD-10-CM | POA: Insufficient documentation

## 2020-06-08 LAB — CBC WITH DIFFERENTIAL/PLATELET
Abs Immature Granulocytes: 0.02 10*3/uL (ref 0.00–0.07)
Basophils Absolute: 0.1 10*3/uL (ref 0.0–0.1)
Basophils Relative: 1 %
Eosinophils Absolute: 0.3 10*3/uL (ref 0.0–0.5)
Eosinophils Relative: 4 %
HCT: 34.1 % — ABNORMAL LOW (ref 36.0–46.0)
Hemoglobin: 10.2 g/dL — ABNORMAL LOW (ref 12.0–15.0)
Immature Granulocytes: 0 %
Lymphocytes Relative: 12 %
Lymphs Abs: 0.8 10*3/uL (ref 0.7–4.0)
MCH: 23.3 pg — ABNORMAL LOW (ref 26.0–34.0)
MCHC: 29.9 g/dL — ABNORMAL LOW (ref 30.0–36.0)
MCV: 77.9 fL — ABNORMAL LOW (ref 80.0–100.0)
Monocytes Absolute: 0.5 10*3/uL (ref 0.1–1.0)
Monocytes Relative: 7 %
Neutro Abs: 5.3 10*3/uL (ref 1.7–7.7)
Neutrophils Relative %: 76 %
Platelets: 164 10*3/uL (ref 150–400)
RBC: 4.38 MIL/uL (ref 3.87–5.11)
RDW: 19.8 % — ABNORMAL HIGH (ref 11.5–15.5)
WBC: 6.9 10*3/uL (ref 4.0–10.5)
nRBC: 0 % (ref 0.0–0.2)

## 2020-08-27 ENCOUNTER — Encounter: Payer: Self-pay | Admitting: Emergency Medicine

## 2020-08-27 ENCOUNTER — Emergency Department
Admission: EM | Admit: 2020-08-27 | Discharge: 2020-08-27 | Disposition: A | Discharge status: Home / Self Care | Payer: Self-pay | Attending: Emergency Medicine | Admitting: Emergency Medicine

## 2020-08-27 ENCOUNTER — Other Ambulatory Visit: Payer: Self-pay

## 2020-08-27 DIAGNOSIS — Z79899 Other long term (current) drug therapy: Secondary | ICD-10-CM | POA: Insufficient documentation

## 2020-08-27 DIAGNOSIS — Z20822 Contact with and (suspected) exposure to covid-19: Secondary | ICD-10-CM | POA: Insufficient documentation

## 2020-08-27 DIAGNOSIS — F17210 Nicotine dependence, cigarettes, uncomplicated: Secondary | ICD-10-CM | POA: Insufficient documentation

## 2020-08-27 DIAGNOSIS — J069 Acute upper respiratory infection, unspecified: Secondary | ICD-10-CM | POA: Insufficient documentation

## 2020-08-27 LAB — RESP PANEL BY RT-PCR (FLU A&B, COVID) ARPGX2
Influenza A by PCR: NEGATIVE
Influenza B by PCR: NEGATIVE
SARS Coronavirus 2 by RT PCR: NEGATIVE

## 2020-08-27 LAB — GROUP A STREP BY PCR: Group A Strep by PCR: NOT DETECTED

## 2020-08-27 MED ORDER — GUAIFENESIN-CODEINE 100-10 MG/5ML PO SOLN: 5.0000 mL | Freq: Four times a day (QID) | ORAL | 0 refills | Status: DC | PRN

## 2020-08-27 MED ORDER — PROMETHAZINE HCL 25 MG PO TABS: 25.0000 mg | ORAL_TABLET | Freq: Four times a day (QID) | ORAL | 0 refills | Status: DC | PRN

## 2020-08-27 NOTE — Discharge Instructions (Signed)
Follow-up with your primary care provider if any continued problems or concerns.  Increase fluids to stay hydrated.  Also Tylenol or ibuprofen if needed for fever, headache or body aches.  A prescription for Phenergan was sent to your pharmacy if needed for nausea and a prescription for guaifenesin with codeine for cough and congestion.  You may also use saline nose spray to help with nasal congestion if needed.  Having a humidifier or a pan of water on your stove to help put moisture in the air will help when you are furnace is running.

## 2020-08-27 NOTE — ED Provider Notes (Signed)
[facility] Emergency Department Provider Note  ____________________________________________   First MD Initiated Contact with Patient 08/27/20 [phone]     (approximate)  I have reviewed the triage vital signs and the nursing notes.   HISTORY  Chief Complaint Sore Throat and Generalized Body Aches   HPI Katrina Page is a 39 y.o. female presents to the ED with complaint of sore throat and generalized body aches for last 2 days.  Patient reports a subjective fever along with nausea but no vomiting.  Patient denies any diarrhea.  Currently she is unaware of any members of her family being sick but patient reports that she was at the [facility] on Friday night.  Patient is a smoker and nonvaccinated.  He rates her pain as an 8 out of 10.     Past Medical History:  Diagnosis Date  . Adrenal nodule ([place]) 06/2019   left  . Anemia   . Bronchitis   . Depression   . History of kidney stones   . Migraines   . Nodule of right lung 06/2019  . Pulmonary emboli ([place]) 07/23/2019   bilateral  . Thrombocytopenia [facility])     Patient Active Problem List   Diagnosis Date Noted  . Nausea and vomiting 04/03/2020  . Refractory nausea and vomiting 04/02/2020  . SIRS (systemic inflammatory response syndrome) (HCC)   . Nausea   . Intractable vomiting   . Intractable vomiting with nausea 03/24/2020  . AKI (acute kidney injury) ([place]) 03/24/2020  . Anemia 03/24/2020  . Thrombocytopenia ([place]) 02/10/2020  . Intractable nausea and vomiting 02/09/2020  . Hypokalemia due to excessive gastrointestinal loss of potassium   . Hypomagnesemia   . Left adrenal mass ([place])   . Obstructive uropathy   . 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 Suicide attempt by drug overdose ([place]) 12/29/2019  . Chronic back pain 12/29/2019  . Nicotine abuse 12/29/2019  . MDD (major depressive disorder) 12/28/2019  . MDD (major depressive disorder), severe  ([place]) 12/24/2019  . Alcohol abuse 12/24/2019  . Left renal stone 08/25/2019  . Chest pain 07/24/2019  . Dyspnea 07/24/2019  . Tobacco use 07/24/2019  . Pulmonary embolism ([place]) 07/23/2019  . Sepsis ([place]) 11/29/2018    Past Surgical History:  Procedure Laterality Date  . APPENDECTOMY    . CHOLECYSTECTOMY    . CYSTOSCOPY W/ RETROGRADES Left 03/05/2020   Procedure: CYSTOSCOPY WITH RETROGRADE PYELOGRAM;  Surgeon: Hollice Espy, MD;  Location: [facility];  Service: Urology;  Laterality: Left;  . CYSTOSCOPY WITH STENT PLACEMENT Left 02/09/2020   Procedure: CYSTOSCOPY WITH STENT PLACEMENT;  Surgeon: Hollice Espy, MD;  Location: [facility];  Service: Urology;  Laterality: Left;  . CYSTOSCOPY/URETEROSCOPY/HOLMIUM LASER/STENT PLACEMENT Left 03/05/2020   Procedure: CYSTOSCOPY/URETEROSCOPY/HOLMIUM LASER/STENT EXCHANGE;  Surgeon: Hollice Espy, MD;  Location: [facility];  Service: Urology;  Laterality: Left;  . ESOPHAGOGASTRODUODENOSCOPY (EGD) WITH PROPOFOL N/A 03/27/2020   Procedure: ESOPHAGOGASTRODUODENOSCOPY (EGD) WITH PROPOFOL;  Surgeon: Lucilla Lame, MD;  Location: [facility];  Service: Endoscopy;  Laterality: N/A;  . TUBAL LIGATION      Prior to Admission medications   Medication Sig Start Date End Date Taking? Authorizing Provider  albuterol (VENTOLIN HFA) 108 (90 Base) MCG/ACT inhaler Inhale 1-2 puffs into the lungs every 4 (four) hours as needed for wheezing or shortness of breath. 01/02/20   Clapacs, Madie Reno, MD  Folic Acid-Vit G5-XMI W80 0.4-50-0.1 MG TABS SMARTSIG:1 Tablet(s) By Mouth Daily 03/29/20   [provider]  guaiFENesin-codeine 100-10 MG/5ML syrup Take 5 mLs by mouth every 6 (six) hours as needed for cough. 08/27/20   Johnn Hai, PA-C  hydrOXYzine (ATARAX/VISTARIL) 50 MG tablet Take 1 tablet (50 mg total) by mouth 3 (three) times daily as needed for anxiety. 03/27/20   Fritzi Mandes, MD  oxybutynin (DITROPAN) 5 MG tablet Take 1 tablet (5 mg total) by mouth every 8  (eight) hours as needed for bladder spasms. 02/12/20   Lorella Nimrod, MD  pantoprazole (PROTONIX) 40 MG tablet Take 1 tablet (40 mg total) by mouth daily. 03/27/20   Fritzi Mandes, MD  Potassium Chloride ER 20 MEQ TBCR Take 20 mEq by mouth daily.  03/29/20   [provider]  promethazine (PHENERGAN) 25 MG tablet Take 1 tablet (25 mg total) by mouth every 6 (six) hours as needed for nausea or vomiting. 08/27/20   Johnn Hai, PA-C  Venlafaxine HCl 150 MG TB24 Take 1 tablet by mouth daily. 05/07/20   [provider]  vitamin B-12 1000 MCG tablet Take 1 tablet (1,000 mcg total) by mouth daily. 02/13/20   Lorella Nimrod, MD    Allergies Patient has no known allergies.  Family History  Problem Relation Age of Onset  . Hypertension Mother   . Diabetes Mother   . Stroke Mother   . Breast cancer Mother     Social History Social History   Tobacco Use  . Smoking status: Current Every Day Smoker    Packs/day: 0.25    Years: 20.00    Pack years: 5.00    Types: Cigarettes  . Smokeless tobacco: Never Used  Vaping Use  . Vaping Use: Never used  Substance Use Topics  . Alcohol use: Yes    Alcohol/week: 6.0 standard drinks    Types: 6 Shots of liquor per week  . Drug use: No    Review of Systems Constitutional: Subjective fever/chills Eyes: No visual changes. ENT: Positive sore throat.  Positive for congestion. Cardiovascular: Denies chest pain. Respiratory: Denies shortness of breath.  Positive cough. Gastrointestinal: No abdominal pain.  Positive nausea, no vomiting.  No diarrhea.  No constipation. Genitourinary: Negative for dysuria. Musculoskeletal: Positive generalized body aches. Skin: Negative for rash. Neurological: Negative for headaches, focal weakness or numbness. ____________________________________________   PHYSICAL EXAM:  VITAL SIGNS: ED Triage Vitals  Enc Vitals Group     BP 08/27/20 0714 133/73     Pulse Rate 08/27/20 0714 (!) 55     Resp  08/27/20 0714 18     Temp 08/27/20 0714 98.6 F (37 C)     Temp Source 08/27/20 0714 Oral     SpO2 08/27/20 0714 100 %     Weight 08/27/20 0711 210 lb (95.3 kg)     Height 08/27/20 0711 5\' 9"  (1.753 m)     Head Circumference --      Peak Flow --      Pain Score 08/27/20 0710 8     Pain Loc --      Pain Edu? --      Excl. in [place]? --     Constitutional: Alert and oriented. Well appearing and in no acute distress. Eyes: Conjunctivae are normal. PERRL. EOMI. Head: Atraumatic. Nose: Mild congestion/no rhinnorhea. Mouth/Throat: Mucous membranes are moist.  Oropharynx non-erythematous.  No exudate and uvula is midline.  Moderate posterior drainage noted. Neck: No stridor.   Hematological/Lymphatic/Immunilogical: No cervical lymphadenopathy. Cardiovascular: Normal rate, regular rhythm. Grossly normal heart sounds.  Good peripheral circulation.  Respiratory: Normal respiratory effort.  No retractions. Lungs CTAB. Gastrointestinal: Soft and nontender. No distention.  Musculoskeletal: Moves upper and lower extremities with any difficulty.  Normal gait was noted. Neurologic:  Normal speech and language. No gross focal neurologic deficits are appreciated. No gait instability. Skin:  Skin is warm, dry and intact. No rash noted. Psychiatric: Mood and affect are normal. Speech and behavior are normal.  ____________________________________________   LABS (all labs ordered are listed, but only abnormal results are displayed)  Labs Reviewed  GROUP A STREP BY PCR  RESP PANEL BY RT-PCR (FLU A&B, COVID) [id number]     PROCEDURES  Procedure(s) performed (including Critical Care):  Procedures   ____________________________________________   INITIAL IMPRESSION / ASSESSMENT AND PLAN / ED COURSE  As part of my medical decision making, I reviewed the following data within the electronic MEDICAL RECORD NUMBER Notes from prior ED visits and [place] Controlled Substance Database   39 year old female presents  to the ED with complaint of sore throat and generalized body aches for the last 2 days.  Patient is a smoker and nonvaccinated.  She reports that she went [facility] Friday night before experiencing symptoms.  Physical exam was positive for nasal congestion and posterior drainage.  Nasopharynx swab was negative for Covid and influenza.  Strep test was negative.  Patient was reassured.  She was discharged with prescription for Phenergan and Robitussin-AC.  Patient is aware that this also has codeine in the medication and cannot drive or operate machinery while taking this medication. ____________________________________________   FINAL CLINICAL IMPRESSION(S) / ED DIAGNOSES  Final diagnoses:  URI with cough and congestion     ED Discharge Orders         Ordered    promethazine (PHENERGAN) 25 MG tablet  Every 6 hours PRN        08/27/20 0953    guaiFENesin-codeine 100-10 MG/5ML syrup  Every 6 hours PRN        08/27/20 0953          *Please note:  Katrina Page was evaluated in Emergency Department on 08/27/2020 for the symptoms described in the history of present illness. She was evaluated in the context of the global COVID-19 pandemic, which necessitated consideration that the patient might be at risk for infection with the SARS-CoV-2 virus that causes COVID-19. Institutional protocols and algorithms that pertain to the evaluation of patients at risk for COVID-19 are in a state of rapid change based on information released by regulatory bodies including the CDC and federal and state organizations. These policies and algorithms were followed during the patient's care in the ED.  Some ED evaluations and interventions may be delayed as a result of limited staffing during and the pandemic.*   Note:  This document was prepared using Dragon voice recognition software and may include unintentional dictation errors.    Johnn Hai, PA-C 08/27/20 1004    Blake Divine,  MD 08/27/20 1513

## 2020-08-27 NOTE — ED Notes (Signed)
Pt provided with ice chips

## 2020-08-27 NOTE — ED Triage Notes (Signed)
Patient to ER for c/o sore throat x2 days with generalized body aches. Patient states she went to [facility] on Friday night.

## 2020-09-17 ENCOUNTER — Emergency Department: Payer: Self-pay

## 2020-09-17 ENCOUNTER — Encounter: Payer: Self-pay | Admitting: Emergency Medicine

## 2020-09-17 ENCOUNTER — Other Ambulatory Visit: Payer: Self-pay

## 2020-09-17 ENCOUNTER — Emergency Department
Admission: EM | Admit: 2020-09-17 | Discharge: 2020-09-17 | Disposition: A | Discharge status: Home / Self Care | Payer: Self-pay | Attending: Emergency Medicine | Admitting: Emergency Medicine

## 2020-09-17 DIAGNOSIS — S20212A Contusion of left front wall of thorax, initial encounter: Secondary | ICD-10-CM | POA: Insufficient documentation

## 2020-09-17 DIAGNOSIS — F17210 Nicotine dependence, cigarettes, uncomplicated: Secondary | ICD-10-CM | POA: Insufficient documentation

## 2020-09-17 DIAGNOSIS — W0110XA Fall on same level from slipping, tripping and stumbling with subsequent striking against unspecified object, initial encounter: Secondary | ICD-10-CM | POA: Insufficient documentation

## 2020-09-17 DIAGNOSIS — Y92096 Garden or yard of other non-institutional residence as the place of occurrence of the external cause: Secondary | ICD-10-CM | POA: Insufficient documentation

## 2020-09-17 MED ORDER — METHOCARBAMOL 500 MG PO TABS: 500.0000 mg | ORAL_TABLET | Freq: Three times a day (TID) | ORAL | 0 refills | Status: AC | PRN

## 2020-09-17 MED ORDER — MELOXICAM 15 MG PO TABS: 15.0000 mg | ORAL_TABLET | Freq: Every day | ORAL | 2 refills | Status: AC

## 2020-09-17 NOTE — ED Provider Notes (Signed)
[facility]EMERGENCY DEPARTMENT  ____________________________________________  Time seen: Approximately 4:36 PM  I have reviewed the triage vital signs and the nursing notes.   HISTORY  Chief Complaint Shoulder Injury   Historian Patient     HPI Katrina Page is a 39 y.o. female presents to the emergency department with left shoulder discomfort and left anterior chest wall pain after patient took a mechanical fall on Saturday, 2 days ago.  Patient reports that she was carrying a [place] pan and tripped over something in her yard.  She states that she experienced immediate discomfort along the left pectoralis major.  She states that her pain is worsened with movement of the left upper extremity.  No numbness or tingling in the upper and lower extremities.  No neck pain.  Patient states that she has been taking oxycodone for pain but it has not been helping her.  No chest tightness or shortness of breath.  No other alleviating measures have been attempted.  Past Medical History:  Diagnosis Date  . Adrenal nodule (HCC) 06/2019   left  . Anemia   . Bronchitis   . Depression   . History of kidney stones   . Migraines   . Nodule of right lung 06/2019  . Pulmonary emboli (HCC) 07/23/2019   bilateral  . Thrombocytopenia (HCC)      Immunizations up to date:  Yes.     Past Medical History:  Diagnosis Date  . Adrenal nodule (HCC) 06/2019   left  . Anemia   . Bronchitis   . Depression   . History of kidney stones   . Migraines   . Nodule of right lung 06/2019  . Pulmonary emboli (HCC) 07/23/2019   bilateral  . Thrombocytopenia [facility])     Patient Active Problem List   Diagnosis Date Noted  . Nausea and vomiting 04/03/2020  . Refractory nausea and vomiting 04/02/2020  . SIRS (systemic inflammatory response syndrome) (HCC)   . Nausea   . Intractable vomiting   . Intractable vomiting with nausea 03/24/2020  . AKI (acute kidney injury) (HCC) 03/24/2020  . Anemia 03/24/2020   . Thrombocytopenia (HCC) 02/10/2020  . Intractable nausea and vomiting 02/09/2020  . Hypokalemia due to excessive gastrointestinal loss of potassium   . Hypomagnesemia   . Left adrenal mass (HCC)   . Obstructive uropathy   . Severe recurrent major depression without psychotic features (HCC) 12/29/2019  . Suicide attempt by drug overdose (HCC) 12/29/2019  . Chronic back pain 12/29/2019  . Nicotine abuse 12/29/2019  . MDD (major depressive disorder) 12/28/2019  . MDD (major depressive disorder), severe (HCC) 12/24/2019  . Alcohol abuse 12/24/2019  . Left renal stone 08/25/2019  . Chest pain 07/24/2019  . Dyspnea 07/24/2019  . Tobacco use 07/24/2019  . Pulmonary embolism (HCC) 07/23/2019  . Sepsis (HCC) 11/29/2018    Past Surgical History:  Procedure Laterality Date  . APPENDECTOMY    . CHOLECYSTECTOMY    . CYSTOSCOPY W/ RETROGRADES Left 03/05/2020   Procedure: CYSTOSCOPY WITH RETROGRADE PYELOGRAM;  Surgeon: Vanna Scotland, MD;  Location: [facility] ORS;  Service: Urology;  Laterality: Left;  . CYSTOSCOPY WITH STENT PLACEMENT Left 02/09/2020   Procedure: CYSTOSCOPY WITH STENT PLACEMENT;  Surgeon: Vanna Scotland, MD;  Location: [facility] ORS;  Service: Urology;  Laterality: Left;  . CYSTOSCOPY/URETEROSCOPY/HOLMIUM LASER/STENT PLACEMENT Left 03/05/2020   Procedure: CYSTOSCOPY/URETEROSCOPY/HOLMIUM LASER/STENT EXCHANGE;  Surgeon: Vanna Scotland, MD;  Location: [facility] ORS;  Service: Urology;  Laterality: Left;  . ESOPHAGOGASTRODUODENOSCOPY (EGD) WITH PROPOFOL N/A 03/27/2020  Procedure: ESOPHAGOGASTRODUODENOSCOPY (EGD) WITH PROPOFOL;  Surgeon: Midge Minium, MD;  Location: [facility] ENDOSCOPY;  Service: Endoscopy;  Laterality: N/A;  . TUBAL LIGATION      Prior to Admission medications   Medication Sig Start Date End Date Taking? Authorizing Provider  meloxicam (MOBIC) 15 MG tablet Take 1 tablet (15 mg total) by mouth daily. 09/17/20 09/17/21 Yes Pia Mau M, PA-C  methocarbamol (ROBAXIN) 500 MG  tablet Take 1 tablet (500 mg total) by mouth every 8 (eight) hours as needed for up to 5 days. 09/17/20 09/22/20 Yes Pia Mau M, PA-C  albuterol (VENTOLIN HFA) 108 (90 Base) MCG/ACT inhaler Inhale 1-2 puffs into the lungs every 4 (four) hours as needed for wheezing or shortness of breath. 01/02/20   Clapacs, Jackquline Denmark, MD  Folic Acid-Vit B6-Vit B12 0.4-50-0.1 MG TABS SMARTSIG:1 Tablet(s) By Mouth Daily 03/29/20   [provider]  guaiFENesin-codeine 100-10 MG/5ML syrup Take 5 mLs by mouth every 6 (six) hours as needed for cough. 08/27/20   Tommi Rumps, PA-C  hydrOXYzine (ATARAX/VISTARIL) 50 MG tablet Take 1 tablet (50 mg total) by mouth 3 (three) times daily as needed for anxiety. 03/27/20   Enedina Finner, MD  oxybutynin (DITROPAN) 5 MG tablet Take 1 tablet (5 mg total) by mouth every 8 (eight) hours as needed for bladder spasms. 02/12/20   Arnetha Courser, MD  pantoprazole (PROTONIX) 40 MG tablet Take 1 tablet (40 mg total) by mouth daily. 03/27/20   Enedina Finner, MD  Potassium Chloride ER 20 MEQ TBCR Take 20 mEq by mouth daily.  03/29/20   [provider]  promethazine (PHENERGAN) 25 MG tablet Take 1 tablet (25 mg total) by mouth every 6 (six) hours as needed for nausea or vomiting. 08/27/20   Tommi Rumps, PA-C  Venlafaxine HCl 150 MG TB24 Take 1 tablet by mouth daily. 05/07/20   [provider]  vitamin B-12 1000 MCG tablet Take 1 tablet (1,000 mcg total) by mouth daily. 02/13/20   Arnetha Courser, MD    Allergies Patient has no known allergies.  Family History  Problem Relation Age of Onset  . Hypertension Mother   . Diabetes Mother   . Stroke Mother   . Breast cancer Mother     Social History Social History   Tobacco Use  . Smoking status: Current Every Day Smoker    Packs/day: 0.25    Years: 20.00    Pack years: 5.00    Types: Cigarettes  . Smokeless tobacco: Never Used  Vaping Use  . Vaping Use: Never used  Substance Use Topics  . Alcohol use: Yes     Alcohol/week: 6.0 standard drinks    Types: 6 Shots of liquor per week  . Drug use: No     Review of Systems  Constitutional: No fever/chills Eyes:  No discharge ENT: No upper respiratory complaints. Respiratory: no cough. No SOB/ use of accessory muscles to breath Gastrointestinal:   No nausea, no vomiting.  No diarrhea.  No constipation. Musculoskeletal: Patient has left sided chest wall pain.  Skin: Negative for rash, abrasions, lacerations, ecchymosis.   ____________________________________________   PHYSICAL EXAM:  VITAL SIGNS: ED Triage Vitals  Enc Vitals Group     BP 09/17/20 1411 (!) 154/92     Pulse Rate 09/17/20 1411 (!) 104     Resp 09/17/20 1411 20     Temp 09/17/20 1411 98.6 F (37 C)     Temp Source 09/17/20 1411 Oral     SpO2  09/17/20 1411 99 %     Weight 09/17/20 1412 200 lb (90.7 kg)     Height 09/17/20 1412 5\' 9"  (1.753 m)     Head Circumference --      Peak Flow --      Pain Score 09/17/20 1412 10     Pain Loc --      Pain Edu? --      Excl. in GC? --      Constitutional: Alert and oriented. Well appearing and in no acute distress. Eyes: Conjunctivae are normal. PERRL. EOMI. Head: Atraumatic. Cardiovascular: Normal rate, regular rhythm. Normal S1 and S2.  Good peripheral circulation. Respiratory: Normal respiratory effort without tachypnea or retractions. Lungs CTAB. Good air entry to the bases with no decreased or absent breath sounds Gastrointestinal: Bowel sounds x 4 quadrants. Soft and nontender to palpation. No guarding or rigidity. No distention. Musculoskeletal: Full range of motion to all extremities. No obvious deformities noted.  Patient has symmetric strength in the upper extremities.  She has reproducible tenderness to palpation over the lateral aspect of the pectoralis major.  Palpable radial pulse, left. Neurologic:  Normal for age. No gross focal neurologic deficits are appreciated.  Skin:  Skin is warm, dry and intact. No rash  noted. Psychiatric: Mood and affect are normal for age. Speech and behavior are normal.   ____________________________________________   LABS (all labs ordered are listed, but only abnormal results are displayed)  Labs Reviewed - No data to display ____________________________________________  EKG   ____________________________________________  RADIOLOGY 09/19/20, personally viewed and evaluated these images (plain radiographs) as part of my medical decision making, as well as reviewing the written report by the radiologist.  DG Chest 2 View  Result Date: 09/17/2020 CLINICAL DATA:  Pain following recent fall EXAM: CHEST - 2 VIEW COMPARISON:  April 30, 2020 FINDINGS: Lungs are clear. Heart size and pulmonary vascularity are normal. No adenopathy. No pneumothorax. No bone lesions. IMPRESSION: Lungs clear.  Cardiac silhouette normal. Electronically Signed   By: May 02, 2020 III M.D.   On: 09/17/2020 14:58   DG Shoulder Left  Result Date: 09/17/2020 CLINICAL DATA:  Pain following fall EXAM: LEFT SHOULDER - 2+ VIEW COMPARISON:  None. FINDINGS: Oblique, Y scapular, and axillary images were obtained. No fracture or dislocation. Joint spaces appear normal. No erosive change. Visualized left lung clear. IMPRESSION: No fracture or dislocation.  No evident arthropathy. Electronically Signed   By: 09/19/2020 III M.D.   On: 09/17/2020 14:58    ____________________________________________    PROCEDURES  Procedure(s) performed:     Procedures     Medications - No data to display   ____________________________________________   INITIAL IMPRESSION / ASSESSMENT AND PLAN / ED COURSE  Pertinent labs & imaging results that were available during my care of the patient were reviewed by me and considered in my medical decision making (see chart for details).      Assessment and plan Chest wall contusion 39 year old female presents to the emergency department  with lateral left-sided chest wall pain after mechanical fall 2 days ago.  Patient was mildly tachycardic at triage and hypertensive but vital signs were otherwise reassuring.  She had full range of motion at the left shoulder and had reproducible tenderness to palpation over the lateral aspect of the pectoralis major.  She was discharged with meloxicam to be taken daily for pain and inflammation and a muscle relaxer, Robaxin at night before bed.  She was advised to  follow-up with her primary care provider if symptoms persist.  All patient questions were answered.     ____________________________________________  FINAL CLINICAL IMPRESSION(S) / ED DIAGNOSES  Final diagnoses:  Contusion of left chest wall, initial encounter      NEW MEDICATIONS STARTED DURING THIS VISIT:  ED Discharge Orders         Ordered    meloxicam (MOBIC) 15 MG tablet  Daily        09/17/20 1630    methocarbamol (ROBAXIN) 500 MG tablet  Every 8 hours PRN        09/17/20 1630              This chart was dictated using voice recognition software/Dragon. Despite best efforts to proofread, errors can occur which can change the meaning. Any change was purely unintentional.     Lannie Fields, PA-C 09/17/20 1639    Vladimir Crofts, MD 09/17/20 [phone]

## 2020-09-17 NOTE — Discharge Instructions (Addendum)
Take Meloxicam and Robaxin for pain.

## 2020-09-17 NOTE — ED Notes (Signed)
Pt signed e signature.  Discharge inst to pt.   

## 2020-09-17 NOTE — ED Triage Notes (Signed)
Pt via POV from home for mechanical fall on Saturday. Pt c/o L shoulder pain that started immediately after fall. Pt is A&Ox4 and NAD.

## 2020-11-10 ENCOUNTER — Emergency Department
Admission: EM | Admit: 2020-11-10 | Discharge: 2020-11-10 | Disposition: A | Discharge status: Home / Self Care | Payer: Self-pay | Attending: Emergency Medicine | Admitting: Emergency Medicine

## 2020-11-10 DIAGNOSIS — M79601 Pain in right arm: Secondary | ICD-10-CM | POA: Insufficient documentation

## 2020-11-10 DIAGNOSIS — M62831 Muscle spasm of calf: Secondary | ICD-10-CM | POA: Insufficient documentation

## 2020-11-10 DIAGNOSIS — M79602 Pain in left arm: Secondary | ICD-10-CM | POA: Insufficient documentation

## 2020-11-10 DIAGNOSIS — Y907 Blood alcohol level of 200-239 mg/100 ml: Secondary | ICD-10-CM | POA: Insufficient documentation

## 2020-11-10 DIAGNOSIS — R451 Restlessness and agitation: Secondary | ICD-10-CM | POA: Insufficient documentation

## 2020-11-10 DIAGNOSIS — E876 Hypokalemia: Secondary | ICD-10-CM | POA: Insufficient documentation

## 2020-11-10 DIAGNOSIS — M62838 Other muscle spasm: Secondary | ICD-10-CM

## 2020-11-10 DIAGNOSIS — E860 Dehydration: Secondary | ICD-10-CM | POA: Insufficient documentation

## 2020-11-10 DIAGNOSIS — F17210 Nicotine dependence, cigarettes, uncomplicated: Secondary | ICD-10-CM | POA: Insufficient documentation

## 2020-11-10 LAB — HCG, QUANTITATIVE, PREGNANCY: hCG, Beta Chain, Quant, S: 1 m[IU]/mL (ref <5)

## 2020-11-10 LAB — CBC WITH DIFFERENTIAL/PLATELET
Abs Immature Granulocytes: 0.04 10*3/uL (ref 0.00–0.07)
Basophils Absolute: 0.1 10*3/uL (ref 0.0–0.1)
Basophils Relative: 1 %
Eosinophils Absolute: 0 10*3/uL (ref 0.0–0.5)
Eosinophils Relative: 0 %
HCT: 36 % (ref 36.0–46.0)
Hemoglobin: 11.3 g/dL — ABNORMAL LOW (ref 12.0–15.0)
Immature Granulocytes: 1 %
Lymphocytes Relative: 11 %
Lymphs Abs: 0.8 10*3/uL (ref 0.7–4.0)
MCH: 28.4 pg (ref 26.0–34.0)
MCHC: 31.4 g/dL (ref 30.0–36.0)
MCV: 90.5 fL (ref 80.0–100.0)
Monocytes Absolute: 0.3 10*3/uL (ref 0.1–1.0)
Monocytes Relative: 5 %
Neutro Abs: 5.5 10*3/uL (ref 1.7–7.7)
Neutrophils Relative %: 82 %
Platelets: 263 10*3/uL (ref 150–400)
RBC: 3.98 MIL/uL (ref 3.87–5.11)
RDW: 24.6 % — ABNORMAL HIGH (ref 11.5–15.5)
WBC: 6.6 10*3/uL (ref 4.0–10.5)
nRBC: 0 % (ref 0.0–0.2)

## 2020-11-10 LAB — COMPREHENSIVE METABOLIC PANEL
ALT: 36 U/L (ref 0–44)
AST: 70 U/L — ABNORMAL HIGH (ref 15–41)
Albumin: 3.7 g/dL (ref 3.5–5.0)
Alkaline Phosphatase: 74 U/L (ref 38–126)
Anion gap: 20 — ABNORMAL HIGH (ref 5–15)
BUN: 9 mg/dL (ref 6–20)
CO2: 28 mmol/L (ref 22–32)
Calcium: 8.8 mg/dL — ABNORMAL LOW (ref 8.9–10.3)
Chloride: 95 mmol/L — ABNORMAL LOW (ref 98–111)
Creatinine, Ser: 1.06 mg/dL — ABNORMAL HIGH (ref 0.44–1.00)
GFR, Estimated: >60 mL/min (ref >60)
Glucose, Bld: 113 mg/dL — ABNORMAL HIGH (ref 70–99)
Potassium: 2.7 mmol/L — CL (ref 3.5–5.1)
Sodium: 143 mmol/L (ref 135–145)
Total Bilirubin: 0.8 mg/dL (ref 0.3–1.2)
Total Protein: 7.5 g/dL (ref 6.5–8.1)

## 2020-11-10 LAB — ACETAMINOPHEN LEVEL: Acetaminophen (Tylenol), Serum: <10 ug/mL — ABNORMAL LOW (ref 10–30)

## 2020-11-10 LAB — LIPASE, BLOOD: Lipase: 120 U/L — ABNORMAL HIGH (ref 11–51)

## 2020-11-10 LAB — ETHANOL: Alcohol, Ethyl (B): 209 mg/dL — ABNORMAL HIGH (ref <10)

## 2020-11-10 LAB — SALICYLATE LEVEL: Salicylate Lvl: <7 mg/dL — ABNORMAL LOW (ref 7.0–30.0)

## 2020-11-10 LAB — CK: Total CK: 55 U/L (ref 38–234)

## 2020-11-10 LAB — POTASSIUM: Potassium: 3.4 mmol/L — ABNORMAL LOW (ref 3.5–5.1)

## 2020-11-10 LAB — MAGNESIUM: Magnesium: 1.6 mg/dL — ABNORMAL LOW (ref 1.7–2.4)

## 2020-11-10 MED ORDER — FAMOTIDINE 20 MG PO TABS: 20.0000 mg | ORAL_TABLET | Freq: Two times a day (BID) | ORAL | 0 refills | Status: DC

## 2020-11-10 MED ORDER — POTASSIUM CHLORIDE CRYS ER 20 MEQ PO TBCR
80.0000 meq | EXTENDED_RELEASE_TABLET | Freq: Once | ORAL | Status: AC
  Administered 2020-11-10: 80 meq via ORAL
  Filled 2020-11-10: qty 4

## 2020-11-10 MED ORDER — HALOPERIDOL LACTATE 5 MG/ML IJ SOLN
5.0000 mg | Freq: Once | INTRAMUSCULAR | Status: AC
  Administered 2020-11-10: 5 mg via INTRAVENOUS
  Filled 2020-11-10: qty 1

## 2020-11-10 MED ORDER — MAGNESIUM SULFATE 2 GM/50ML IV SOLN
2.0000 g | Freq: Once | INTRAVENOUS | Status: AC
  Administered 2020-11-10: 2 g via INTRAVENOUS
  Filled 2020-11-10: qty 50

## 2020-11-10 MED ORDER — MIDAZOLAM HCL 2 MG/2ML IJ SOLN
INTRAMUSCULAR | Status: AC
  Filled 2020-11-10: qty 2

## 2020-11-10 MED ORDER — LACTATED RINGERS IV BOLUS
1000.0000 mL | Freq: Once | INTRAVENOUS | Status: AC
  Administered 2020-11-10: 1000 mL via INTRAVENOUS

## 2020-11-10 MED ORDER — ONDANSETRON 4 MG PO TBDP
4.0000 mg | ORAL_TABLET | Freq: Three times a day (TID) | ORAL | 0 refills | Status: DC | PRN
Start: 2020-11-10 — End: 2023-05-03

## 2020-11-10 MED ORDER — ACETAMINOPHEN 500 MG PO TABS
1000.0000 mg | ORAL_TABLET | Freq: Once | ORAL | Status: AC
  Administered 2020-11-10: 1000 mg via ORAL
  Filled 2020-11-10: qty 2

## 2020-11-10 MED ORDER — KETOROLAC TROMETHAMINE 30 MG/ML IJ SOLN
30.0000 mg | Freq: Once | INTRAMUSCULAR | Status: AC
  Administered 2020-11-10: 30 mg via INTRAVENOUS
  Filled 2020-11-10: qty 1

## 2020-11-10 NOTE — Discharge Instructions (Signed)
Your muscle cramps appear to be due to dehydration causing low potassium levels.  Sure to eat regularly and drink lots of fluids to stay well-hydrated.  You should avoid drinking alcohol for the next several days to allow your body to recover.

## 2020-11-10 NOTE — ED Notes (Signed)
This RN called her brother Jeneen Rinks per pt request for a ride home, brother states he will come when he has a vehicle, pt made aware. Pt not appropiate to put in waiting room at this time, MD aware.

## 2020-11-10 NOTE — ED Notes (Signed)
Pt's ride here to pick up pt.  Pt wheeled out by staff at this time to ride.

## 2020-11-10 NOTE — ED Provider Notes (Signed)
Procedures     ----------------------------------------- 3:45 PM on 11/10/2020 -----------------------------------------  Patient still agitated, describing some tactile hallucination on her feet.  Not in DTs, but I suspect drug-induced delirium possibly from coingestions with alcohol.  She still intoxicated and not lucid.  I think some IV Haldol will help with her symptoms.   ----------------------------------------- 6:35 PM on 11/10/2020 -----------------------------------------  Patient feels fine, ambulatory, tolerating oral intake, stable for discharge at this point.  Repeat potassium is improved    Carrie Mew, MD 11/10/20 1836

## 2020-11-10 NOTE — ED Provider Notes (Signed)
[facility] Emergency Department Provider Note  ____________________________________________   Event Date/Time   First MD Initiated Contact with Patient 11/10/20 1257     (approximate)  I have reviewed the triage vital signs and the nursing notes.   HISTORY  Chief Complaint Altered Mental Status   HPI Katrina Page is a 40 y.o. female with a past medical history of kidney stones, migraines, depression, anemia, bronchitis, PE not currently anticoagulated who presents for assessment of approximately 3 days of worsening severe pain in her bilateral arms and legs.  Patient denies any recent injuries or falls.  She denies any headache, earache, sore throat, chest pain, cough, shortness of breath, nausea, vomiting, diarrhea, dysuria, rash or urinary symptoms.  She denies any illegal drug use in the last couple of days.  No clear alleviating aggravating factors.  No prior similar episodes.  She does endorse drinking couple shots of alcohol last night.         Past Medical History:  Diagnosis Date  . Adrenal nodule ([place]) 06/2019   left  . Anemia   . Bronchitis   . Depression   . History of kidney stones   . Migraines   . Nodule of right lung 06/2019  . Pulmonary emboli ([place]) 07/23/2019   bilateral  . Thrombocytopenia [facility])     Patient Active Problem List   Diagnosis Date Noted  . Nausea and vomiting 04/03/2020  . Refractory nausea and vomiting 04/02/2020  . SIRS (systemic inflammatory response syndrome) (HCC)   . Nausea   . Intractable vomiting   . Intractable vomiting with nausea 03/24/2020  . AKI (acute kidney injury) ([place]) 03/24/2020  . Anemia 03/24/2020  . Thrombocytopenia ([place]) 02/10/2020  . Intractable nausea and vomiting 02/09/2020  . Hypokalemia due to excessive gastrointestinal loss of potassium   . Hypomagnesemia   . Left adrenal mass ([place])   . Obstructive uropathy   . Severe recurrent major depression without psychotic features  ([place]) 12/29/2019  . Suicide attempt by drug overdose ([place]) 12/29/2019  . Chronic back pain 12/29/2019  . Nicotine abuse 12/29/2019  . MDD (major depressive disorder) 12/28/2019  . MDD (major depressive disorder), severe ([place]) 12/24/2019  . Alcohol abuse 12/24/2019  . Left renal stone 08/25/2019  . Chest pain 07/24/2019  . Dyspnea 07/24/2019  . Tobacco use 07/24/2019  . Pulmonary embolism ([place]) 07/23/2019  . Sepsis ([place]) 11/29/2018    Past Surgical History:  Procedure Laterality Date  . APPENDECTOMY    . CHOLECYSTECTOMY    . CYSTOSCOPY W/ RETROGRADES Left 03/05/2020   Procedure: CYSTOSCOPY WITH RETROGRADE PYELOGRAM;  Surgeon: Hollice Espy, MD;  Location: [facility] ORS;  Service: Urology;  Laterality: Left;  . CYSTOSCOPY WITH STENT PLACEMENT Left 02/09/2020   Procedure: CYSTOSCOPY WITH STENT PLACEMENT;  Surgeon: Hollice Espy, MD;  Location: [facility] ORS;  Service: Urology;  Laterality: Left;  . CYSTOSCOPY/URETEROSCOPY/HOLMIUM LASER/STENT PLACEMENT Left 03/05/2020   Procedure: CYSTOSCOPY/URETEROSCOPY/HOLMIUM LASER/STENT EXCHANGE;  Surgeon: Hollice Espy, MD;  Location: [facility] ORS;  Service: Urology;  Laterality: Left;  . ESOPHAGOGASTRODUODENOSCOPY (EGD) WITH PROPOFOL N/A 03/27/2020   Procedure: ESOPHAGOGASTRODUODENOSCOPY (EGD) WITH PROPOFOL;  Surgeon: Lucilla Lame, MD;  Location: [facility] ENDOSCOPY;  Service: Endoscopy;  Laterality: N/A;  . TUBAL LIGATION      Prior to Admission medications   Medication Sig Start Date End Date Taking? Authorizing Provider  albuterol (VENTOLIN HFA) 108 (90 Base) MCG/ACT inhaler Inhale 1-2 puffs into the lungs every 4 (four) hours as needed for wheezing or shortness of  breath. 01/02/20   Clapacs, Madie Reno, MD  Folic Acid-Vit [id number] G18 0.4-50-0.1 MG TABS SMARTSIG:1 Tablet(s) By Mouth Daily 03/29/20   [provider]  guaiFENesin-codeine 100-10 MG/5ML syrup Take 5 mLs by mouth every 6 (six) hours as needed for cough. 08/27/20   Johnn Hai, PA-C   hydrOXYzine (ATARAX/VISTARIL) 50 MG tablet Take 1 tablet (50 mg total) by mouth 3 (three) times daily as needed for anxiety. 03/27/20   Fritzi Mandes, MD  meloxicam (MOBIC) 15 MG tablet Take 1 tablet (15 mg total) by mouth daily. 09/17/20 09/17/21  Lannie Fields, PA-C  oxybutynin (DITROPAN) 5 MG tablet Take 1 tablet (5 mg total) by mouth every 8 (eight) hours as needed for bladder spasms. 02/12/20   Lorella Nimrod, MD  pantoprazole (PROTONIX) 40 MG tablet Take 1 tablet (40 mg total) by mouth daily. 03/27/20   Fritzi Mandes, MD  Potassium Chloride ER 20 MEQ TBCR Take 20 mEq by mouth daily.  03/29/20   [provider]  promethazine (PHENERGAN) 25 MG tablet Take 1 tablet (25 mg total) by mouth every 6 (six) hours as needed for nausea or vomiting. 08/27/20   Johnn Hai, PA-C  Venlafaxine HCl 150 MG TB24 Take 1 tablet by mouth daily. 05/07/20   [provider]  vitamin B-12 1000 MCG tablet Take 1 tablet (1,000 mcg total) by mouth daily. 02/13/20   Lorella Nimrod, MD    Allergies Patient has no known allergies.  Family History  Problem Relation Age of Onset  . Hypertension Mother   . Diabetes Mother   . Stroke Mother   . Breast cancer Mother     Social History Social History   Tobacco Use  . Smoking status: Current Every Day Smoker    Packs/day: 0.25    Years: 20.00    Pack years: 5.00    Types: Cigarettes  . Smokeless tobacco: Never Used  Vaping Use  . Vaping Use: Never used  Substance Use Topics  . Alcohol use: Yes    Alcohol/week: 6.0 standard drinks    Types: 6 Shots of liquor per week  . Drug use: No    Review of Systems  Review of Systems  Constitutional: Negative for chills and fever.  HENT: Negative for sore throat.   Eyes: Negative for pain.  Respiratory: Negative for cough and stridor.   Cardiovascular: Negative for chest pain.  Gastrointestinal: Negative for vomiting.  Genitourinary: Negative for dysuria.  Musculoskeletal: Positive for myalgias (  b/l arms and legs).  Skin: Negative for rash.  Neurological: Negative for seizures, loss of consciousness and headaches.  Psychiatric/Behavioral: Negative for suicidal ideas.  All other systems reviewed and are negative.     ____________________________________________   PHYSICAL EXAM:  VITAL SIGNS: ED Triage Vitals [11/10/20 1255]  Enc Vitals Group     BP (!) 157/106     Pulse Rate (!) 125     Resp      Temp 98.3 F (36.8 C)     Temp Source Oral     SpO2 95 %     Weight      Height      Head Circumference      Peak Flow      Pain Score      Pain Loc      Pain Edu?      Excl. in [place]?    Vitals:   11/10/20 1255 11/10/20 1400  BP: (!) 157/106 128/88  Pulse: (!) 125 Marland Kitchen)  115  Resp:  16  Temp: 98.3 F (36.8 C)   SpO2: 95% 100%   Physical Exam Vitals and nursing note reviewed.  Constitutional:      General: She is in acute distress.     Appearance: She is well-developed and well-nourished. She is ill-appearing.  HENT:     Head: Normocephalic and atraumatic.     Right Ear: External ear normal.     Left Ear: External ear normal.     Mouth/Throat:     Mouth: Mucous membranes are dry.  Eyes:     Conjunctiva/sclera: Conjunctivae normal.  Cardiovascular:     Rate and Rhythm: Normal rate and regular rhythm.     Heart sounds: No murmur heard.   Pulmonary:     Effort: Pulmonary effort is normal. No respiratory distress.     Breath sounds: Normal breath sounds.  Abdominal:     Palpations: Abdomen is soft.     Tenderness: There is no abdominal tenderness.  Musculoskeletal:        General: No edema.     Cervical back: Neck supple.  Skin:    General: Skin is warm and dry.     Capillary Refill: Capillary refill takes 2 to 3 seconds.  Neurological:     General: No focal deficit present.     Mental Status: She is alert.  Psychiatric:        Mood and Affect: Mood and affect normal.     On arrival patient is noted to be rolling around in bed repeatedly flexing her  upper arms and her knees as well as extending her back on the bed.  PERRLA.  EOMI.  She is able to follow commands in all extremities with fairly symmetric strength.  Sensation is intact to light touch of all extremities.  PERRLA.  EOMI.  No facial droop.  ____________________________________________   LABS (all labs ordered are listed, but only abnormal results are displayed)  Labs Reviewed  CBC WITH DIFFERENTIAL/PLATELET - Abnormal; Notable for the following components:      Result Value   Hemoglobin 11.3 (*)    RDW 24.6 (*)    All other components within normal limits  COMPREHENSIVE METABOLIC PANEL - Abnormal; Notable for the following components:   Potassium 2.7 (*)    Chloride 95 (*)    Glucose, Bld 113 (*)    Creatinine, Ser 1.06 (*)    Calcium 8.8 (*)    AST 70 (*)    Anion gap 20 (*)    All other components within normal limits  MAGNESIUM - Abnormal; Notable for the following components:   Magnesium 1.6 (*)    All other components within normal limits  ETHANOL - Abnormal; Notable for the following components:   Alcohol, Ethyl (B) 209 (*)    All other components within normal limits  SALICYLATE LEVEL - Abnormal; Notable for the following components:   Salicylate Lvl <0.9 (*)    All other components within normal limits  ACETAMINOPHEN LEVEL - Abnormal; Notable for the following components:   Acetaminophen (Tylenol), Serum <10 (*)    All other components within normal limits  LIPASE, BLOOD - Abnormal; Notable for the following components:   Lipase 120 (*)    All other components within normal limits  CK  HCG, QUANTITATIVE, PREGNANCY  URINE DRUG SCREEN, QUALITATIVE ([facility] ONLY)  POTASSIUM   ____________________________________________  EKG ____________________________________________  RADIOLOGY  ED MD interpretation:    Official radiology report(s): No results found.  ____________________________________________  PROCEDURES  Procedure(s) performed  (including Critical Care):  .1-3 Lead EKG Interpretation Performed by: Lucrezia Starch, MD Authorized by: Lucrezia Starch, MD     Interpretation: abnormal     ECG rate assessment: tachycardic     Rhythm: sinus tachycardia     Ectopy: none     Conduction: normal       ____________________________________________   INITIAL IMPRESSION / ASSESSMENT AND PLAN / ED COURSE      Patient presents with above-stated history exam via EMS from home initially called out for unclear reasons but seemingly to have severe myalgias on arrival in all extremities.  She is tachycardic and hypertensive with a BP of 157/106 and a heart rate of 125 with stable vital signs.  She denies any trauma and there are no obvious findings on history or exam to suggest acute infectious process.  She does endorse drinking some alcohol last night but is unable to further specify how much he denies any other acute sick symptoms other than pain in her arms and legs.    CBC obtained shows no leukocytosis and hemoglobin at baseline 11.3 compared to 10.2 to 5 months ago.  CMP remarkable for hypokalemia with a potassium of 2.7 and an AST of 70.  Magnesium is 1.6.  No evidence of myositis or rhabdo given CK of 55.  Serum ethanol is elevated at 209 suspect is consistent with acute alcohol intoxication.  Patient denies being a daily alcohol drinker.  hCG is undetectable.  Serum acetaminophen and salicylates both undetectable.  I will plan to aggressively hydrate patient.  She was given Toradol and Tylenol as well for some myalgias which I suspect are likely secondary to his severe hypokalemia as well as hypomagnesemia.  Care patient signed over to Dr. Joni Fears at approximately 1500.  Plan is to reassess patient as well as response to below noted treatments.  We will plan to recheck potassium.  If patient is feeling better and is clinically sober on reassessment she will likely be safe for discharge with outpatient  follow-up.  ____________________________________________   FINAL CLINICAL IMPRESSION(S) / ED DIAGNOSES  Final diagnoses:  Hypokalemia  Hypomagnesemia  Blood alcohol level of 200-239 mg/100 ml  Dehydration  Muscle spasms of both lower extremities    Medications  magnesium sulfate IVPB 2 g 50 mL (2 g Intravenous New Bag/Given 11/10/20 1415)  lactated ringers bolus 1,000 mL (has no administration in time range)  midazolam (VERSED) 2 MG/2ML injection (  Given 11/10/20 1307)  lactated ringers bolus 1,000 mL (1,000 mLs Intravenous Bolus 11/10/20 1310)  potassium chloride SA (KLOR-CON) CR tablet 80 mEq (80 mEq Oral Given 11/10/20 1349)  acetaminophen (TYLENOL) tablet 1,000 mg (1,000 mg Oral Given 11/10/20 1349)  ketorolac (TORADOL) 30 MG/ML injection 30 mg (30 mg Intravenous Given 11/10/20 1414)     ED Discharge Orders    None       Note:  This document was prepared using Dragon voice recognition software and may include unintentional dictation errors.   Lucrezia Starch, MD 11/10/20 [phone]

## 2020-11-10 NOTE — ED Notes (Signed)
Pt presents to ED via EMS with unknown complaint. EMS states her mother called them. Pt just keeps repeating my hands and feet hurt. Pt denies any alcohol or drug use to this RN. Pt is A&Ox4. This RN will report off to primary RN, Anderson Malta.

## 2020-11-10 NOTE — ED Notes (Signed)
Pt refuses vitals and keeps asking to leave but pt is still agitated and having involuntary movements as well as shaking. Pt given water.

## 2020-11-19 ENCOUNTER — Other Ambulatory Visit: Payer: Self-pay

## 2020-11-19 ENCOUNTER — Emergency Department
Admission: EM | Admit: 2020-11-19 | Discharge: 2020-11-19 | Discharge status: Against Medical Advice | Payer: Self-pay | Attending: Emergency Medicine | Admitting: Emergency Medicine

## 2020-11-19 ENCOUNTER — Encounter: Payer: Self-pay | Admitting: Emergency Medicine

## 2020-11-19 DIAGNOSIS — G629 Polyneuropathy, unspecified: Secondary | ICD-10-CM | POA: Insufficient documentation

## 2020-11-19 DIAGNOSIS — F10239 Alcohol dependence with withdrawal, unspecified: Secondary | ICD-10-CM | POA: Insufficient documentation

## 2020-11-19 DIAGNOSIS — F10939 Alcohol use, unspecified with withdrawal, unspecified: Secondary | ICD-10-CM

## 2020-11-19 DIAGNOSIS — F17210 Nicotine dependence, cigarettes, uncomplicated: Secondary | ICD-10-CM | POA: Insufficient documentation

## 2020-11-19 HISTORY — DX: Alcohol abuse, uncomplicated: F10.10

## 2020-11-19 MED ORDER — GABAPENTIN 300 MG PO CAPS: 300.0000 mg | ORAL_CAPSULE | Freq: Three times a day (TID) | ORAL | 2 refills | Status: DC

## 2020-11-19 MED ORDER — GABAPENTIN 300 MG PO CAPS: 300.0000 mg | ORAL_CAPSULE | ORAL | Status: DC

## 2020-11-19 MED ORDER — LORAZEPAM 2 MG PO TABS: 2.0000 mg | ORAL_TABLET | Freq: Once | ORAL | Status: DC

## 2020-11-19 MED ORDER — CHLORDIAZEPOXIDE HCL 5 MG PO CAPS: ORAL_CAPSULE | ORAL | 0 refills | Status: DC

## 2020-11-19 NOTE — Discharge Instructions (Signed)
As we discussed, unfortunately there is no immediate fix to your neuropathy.  We recommend that you take higher dose of gabapentin and we provided a new prescription for 300 mg 3 times a day, which should be about 3 times the dose you were taking previously.  This can be helpful for neuropathy as well as for alcohol dependence.  Additionally, we have provided a prescription for Librium which can also help with alcohol dependence and withdrawal symptoms.  We do not recommend that you drink alcohol along with the Librium or the gabapentin because they can be sedating.  Please continue taking the recommended vitamins which include folic acid, B16, and multivitamins.  Follow-up with your regular doctor at the next available opportunity.

## 2020-11-19 NOTE — ED Notes (Signed)
Dr. Leonides Schanz made aware of pt. No orders provided to this RN at this time. Pt to waiting room

## 2020-11-19 NOTE — ED Notes (Addendum)
Pt reports severe pain to bilateral feet, reports "It feels like I'm walking on glass constantly." reports this has been going on since her last visit here and states "they told me I'm abusing alcohol but that can't be it, I haven't drank anything in a week." pt reports she was seen at [facility] on 2/25 and was diagnosed with neuropathy. Pt hyperventilating, taught breathing techniques with education provided on hyperventilation. Pt continues to hyperventilate. Pt unable to sit still in bed, pillow placed below feet in attempt to increase comfort. Pt tearful, family at bedside.

## 2020-11-19 NOTE — ED Notes (Signed)
This RN in room with Dr. Karma Greaser during his assessment and discussion with pt. Pt repeatedly rolling eyes, speaking over doctor, and requesting "strong pain medication to help sleep." multiple attempts made to educate pt on cause of symptoms and appropriate medical treatment, pt continues to request stronger medication. Education provided that there is no quick fix for pt's symptoms. Pt remains argumentative throughout discussion.

## 2020-11-19 NOTE — ED Provider Notes (Signed)
[facility] Emergency Department Provider Note  ____________________________________________   Event Date/Time   First MD Initiated Contact with Patient 11/19/20 [phone]     (approximate)  I have reviewed the triage vital signs and the nursing notes.   HISTORY  Chief Complaint Foot Pain  History is limited due to the patient's anger and volatility.  HPI Katrina LANGHANS is a 40 y.o. female with medical history as listed below who presents for evaluation of severe sharp and tingling pain  in both of her feet and lower legs that has been going on for more than a week.  She reports that she was seen in this emergency department about 9 days ago and then went to [facility] emergency department 3 days ago; the trip to [facility] was for the same issue.  She reports that they were not able to tell her what was going on and nothing is helping.  She takes gabapentin as prescribed by her therapist (100 mg 3 times a day) but nothing is helping.  She is very frustrated and angry and wants to know what is going on and wants something to stop it.  She denies any recent alcohol use.  She reports that sometimes she gets dizzy when she stands up and moves around and that the pain is excruciating and she cannot stand it.  Although she said that she has not had anything to drink in at least several days and maybe a week, she also only feels better when she is "asleep or drunk" and that drinking helps her go to sleep.        Past Medical History:  Diagnosis Date  . Adrenal nodule ([place]) 06/2019   left  . Alcohol abuse   . Anemia   . Bronchitis   . Depression   . History of kidney stones   . Migraines   . Nodule of right lung 06/2019  . Pulmonary emboli ([place]) 07/23/2019   bilateral  . Thrombocytopenia [facility])     Patient Active Problem List   Diagnosis Date Noted  . Nausea and vomiting 04/03/2020  . Refractory nausea and vomiting 04/02/2020  . SIRS (systemic inflammatory response  syndrome) (HCC)   . Nausea   . Intractable vomiting   . Intractable vomiting with nausea 03/24/2020  . AKI (acute kidney injury) ([place]) 03/24/2020  . Anemia 03/24/2020  . Thrombocytopenia ([place]) 02/10/2020  . Intractable nausea and vomiting 02/09/2020  . Hypokalemia due to excessive gastrointestinal loss of potassium   . Hypomagnesemia   . Left adrenal mass ([place])   . Obstructive uropathy   . Severe recurrent major depression without psychotic features ([place]) 12/29/2019  . Suicide attempt by drug overdose ([place]) 12/29/2019  . Chronic back pain 12/29/2019  . Nicotine abuse 12/29/2019  . MDD (major depressive disorder) 12/28/2019  . MDD (major depressive disorder), severe ([place]) 12/24/2019  . Alcohol abuse 12/24/2019  . Left renal stone 08/25/2019  . Chest pain 07/24/2019  . Dyspnea 07/24/2019  . Tobacco use 07/24/2019  . Pulmonary embolism ([place]) 07/23/2019  . Sepsis ([place]) 11/29/2018    Past Surgical History:  Procedure Laterality Date  . APPENDECTOMY    . CHOLECYSTECTOMY    . CYSTOSCOPY W/ RETROGRADES Left 03/05/2020   Procedure: CYSTOSCOPY WITH RETROGRADE PYELOGRAM;  Surgeon: Hollice Espy, MD;  Location: [facility] ORS;  Service: Urology;  Laterality: Left;  . CYSTOSCOPY WITH STENT PLACEMENT Left 02/09/2020   Procedure: CYSTOSCOPY WITH STENT PLACEMENT;  Surgeon: Hollice Espy, MD;  Location: [facility]  ORS;  Service: Urology;  Laterality: Left;  . CYSTOSCOPY/URETEROSCOPY/HOLMIUM LASER/STENT PLACEMENT Left 03/05/2020   Procedure: CYSTOSCOPY/URETEROSCOPY/HOLMIUM LASER/STENT EXCHANGE;  Surgeon: Hollice Espy, MD;  Location: [facility] ORS;  Service: Urology;  Laterality: Left;  . ESOPHAGOGASTRODUODENOSCOPY (EGD) WITH PROPOFOL N/A 03/27/2020   Procedure: ESOPHAGOGASTRODUODENOSCOPY (EGD) WITH PROPOFOL;  Surgeon: Lucilla Lame, MD;  Location: [facility] ENDOSCOPY;  Service: Endoscopy;  Laterality: N/A;  . TUBAL LIGATION      Prior to Admission medications   Medication Sig Start Date End Date Taking?  Authorizing Provider  chlordiazePOXIDE (LIBRIUM) 5 MG capsule Take 6 caps (30 mg) 4x daily on day 1. Take 5 caps (25 mg) 4x daily on day 2. Take 4 caps (20 mg) 4x daily on day 3. Take 3 caps (15 mg) 4x daily on day 4. Take 2 caps (10 mg) 4x daily on day 5. Take 2 caps (10 mg) 3x daily on day 6. Take 1 cap (5 mg) 3x daily on day 7. Take 1 cap (5 mg) twice daily on day 8. Take 1 cap (5 mg) in the evening on day 9. 11/19/20  Yes Hinda Kehr, MD  gabapentin (NEURONTIN) 300 MG capsule Take 1 capsule (300 mg total) by mouth 3 (three) times daily. 11/19/20 11/19/21 Yes Hinda Kehr, MD  albuterol (VENTOLIN HFA) 108 (90 Base) MCG/ACT inhaler Inhale 1-2 puffs into the lungs every 4 (four) hours as needed for wheezing or shortness of breath. 01/02/20   Clapacs, Madie Reno, MD  famotidine (PEPCID) 20 MG tablet Take 1 tablet (20 mg total) by mouth 2 (two) times daily. 11/10/20   Carrie Mew, MD  Folic Acid-Vit O9-GEX B28 0.4-50-0.1 MG TABS SMARTSIG:1 Tablet(s) By Mouth Daily 03/29/20   [provider]  guaiFENesin-codeine 100-10 MG/5ML syrup Take 5 mLs by mouth every 6 (six) hours as needed for cough. 08/27/20   Johnn Hai, PA-C  hydrOXYzine (ATARAX/VISTARIL) 50 MG tablet Take 1 tablet (50 mg total) by mouth 3 (three) times daily as needed for anxiety. 03/27/20   Fritzi Mandes, MD  meloxicam (MOBIC) 15 MG tablet Take 1 tablet (15 mg total) by mouth daily. 09/17/20 09/17/21  Lannie Fields, PA-C  ondansetron (ZOFRAN ODT) 4 MG disintegrating tablet Take 1 tablet (4 mg total) by mouth every 8 (eight) hours as needed for nausea or vomiting. 11/10/20   Carrie Mew, MD  oxybutynin (DITROPAN) 5 MG tablet Take 1 tablet (5 mg total) by mouth every 8 (eight) hours as needed for bladder spasms. 02/12/20   Lorella Nimrod, MD  pantoprazole (PROTONIX) 40 MG tablet Take 1 tablet (40 mg total) by mouth daily. 03/27/20   Fritzi Mandes, MD  Potassium Chloride ER 20 MEQ TBCR Take 20 mEq by mouth daily.  03/29/20   [provider]  promethazine (PHENERGAN) 25 MG tablet Take 1 tablet (25 mg total) by mouth every 6 (six) hours as needed for nausea or vomiting. 08/27/20   Johnn Hai, PA-C  Venlafaxine HCl 150 MG TB24 Take 1 tablet by mouth daily. 05/07/20   [provider]  vitamin B-12 1000 MCG tablet Take 1 tablet (1,000 mcg total) by mouth daily. 02/13/20   Lorella Nimrod, MD    Allergies Patient has no known allergies.  Family History  Problem Relation Age of Onset  . Hypertension Mother   . Diabetes Mother   . Stroke Mother   . Breast cancer Mother     Social History Social History   Tobacco Use  . Smoking status: Current Every Day Smoker  Packs/day: 0.25    Years: 20.00    Pack years: 5.00    Types: Cigarettes  . Smokeless tobacco: Never Used  Vaping Use  . Vaping Use: Never used  Substance Use Topics  . Alcohol use: Yes    Alcohol/week: 6.0 standard drinks    Types: 6 Shots of liquor per week  . Drug use: No    Review of Systems History is limited due to the patient's anger and volatility.  History is positive for tingling and pain in both of her feet that radiates to her lower legs that has been present for more than a week.  Occasional dizziness.  Difficulty sleeping due to pain. ____________________________________________   PHYSICAL EXAM:  VITAL SIGNS: ED Triage Vitals  Enc Vitals Group     BP 11/19/20 0227 (!) 160/113     Pulse Rate 11/19/20 0227 (!) 123     Resp 11/19/20 0227 20     Temp 11/19/20 0227 98.4 F (36.9 C)     Temp src --      SpO2 11/19/20 0227 100 %     Weight 11/19/20 0228 79.4 kg (175 lb)     Height 11/19/20 0228 1.753 m (5\' 9" )     Head Circumference --      Peak Flow --      Pain Score 11/19/20 0227 10     Pain Loc --      Pain Edu? --      Excl. in [place]? --     Constitutional: Alert and oriented.  Anxious, agitated. Eyes: Conjunctivae are normal.  Head: Atraumatic. Nose: No congestion/rhinnorhea. Mouth/Throat: Patient  is wearing a mask. Neck: No stridor.  No meningeal signs.   Cardiovascular: Tachycardia, regular rhythm. Good peripheral circulation. Respiratory: Normal respiratory effort.  No retractions.  Patient speaking loudly and quickly in full sentences without any dyspnea. Musculoskeletal: No gross deformities of extremities.  Patient reports swelling in her ankles which I do not appreciate.  No pitting edema.  No erythema no ecchymoses.  No swelling such as podagra. Neurologic: Pressured speech and language. No gross focal neurologic deficits are appreciated.  Moving all 4 extremities, ambulating without any apparent difficulty.  Possibly some degree of tremulousness, difficult to appreciate. Skin:  Skin is warm, dry and intact. Psychiatric: Mood and affect are agitated, hostile, verbally aggressive.  Obviously frustrated and upset, cursing, interrupts frequently during conversation.  ____________________________________________   LABS (all labs ordered are listed, but only abnormal results are displayed)  Labs Reviewed - No data to display ____________________________________________  EKG  None - EKG not ordered by ED physician ____________________________________________  RADIOLOGY I, Hinda Kehr, personally viewed and evaluated these images (plain radiographs) as part of my medical decision making, as well as reviewing the written report by the radiologist.  ED MD interpretation: No indication for emergent imaging  Official radiology report(s): No results found.  ____________________________________________   PROCEDURES   Procedure(s) performed (including Critical Care):  Procedures   ____________________________________________   INITIAL IMPRESSION / MDM / ASSESSMENT AND PLAN / ED COURSE  As part of my medical decision making, I reviewed the following data within the [place] History obtained from family, Nursing notes reviewed and incorporated, Old  chart reviewed and Notes from prior ED visits   Differential diagnosis includes, but is not limited to, alcohol induced polyneuropathy, vitamin deficiency, alcohol withdrawal, other psychiatric illness including but not limited to personality disorder or mood disorder.  I reviewed the patient's medical record  extensively including her recent visit to [facility].  She had a thorough evaluation at that time and her presentation was similar in terms of her vital signs (tachycardia) and her mood and volatility.  Her work-up was reassuring and she was diagnosed with neuropathy, encouraged to take Librium and vitamin supplements and follow-up with her primary care doctor to discuss whether or not an increased dose of gabapentin would be helpful.  I asked her about this and try to initiating conversation about her recent [facility] visit.  She became increasingly angry, stating that it is only been a week and that she has not had time to follow-up as an outpatient.  When I inquired about alcohol use she became even more angry, cursing, and implying that I did not want to help her because she is an alcoholic.  Her adult daughter is at bedside and is calm but obviously frustrated as well.  I tried to address questions they had about the nature of neuropathy and how it is not possible to simply stop it and that it is a long-term condition.  I explained that neuropathy can definitely be caused by long-term alcohol use as well as by diabetes.  I suggested that we treat her either with a benzodiazepine in the emergency department for withdrawal symptoms and give her a higher dose of gabapentin and explained that I would write her prescription for Librium taper and for higher dose of gabapentin which I think will be helpful both for the neuropathy as well as for the alcohol dependence and withdrawal symptoms.  Unfortunately she remained very unhappy and frustrated but at least initially agreed to this plan.  I encouraged close  outpatient follow-up.  The patient asked multiple times if I could just give her pain medicine and I explained that narcotics are not an appropriate treatment for neuropathy and that there are better options and each time I declined prescribing narcotics ("pain medicine") it made her more and more angry.    ED nurse Hildred Alamin was present throughout my interview and exam with the patient and her daughter.     Clinical Course as of 11/19/20 0834  Mon Nov 19, 2020  0550 Unfortunately the patient continues to be very upset and is refusing to take the medication I ordered (Ativan 2 mg by mouth and gabapentin 300 mg by mouth).  She is loudly complaining at the nursing desk about the quality of care at this hospital and how we are not doing anything for her.  She was refusing her medication and her discharge paperwork. [CF]  [phone] The charge nurse Lorriane Shire had her sign AMA paperwork since she refused the medication but the plan was for discharge and outpatient follow-up. [CF]    Clinical Course User Index [CF] Hinda Kehr, MD     ____________________________________________  FINAL CLINICAL IMPRESSION(S) / ED DIAGNOSES  Final diagnoses:  Peripheral polyneuropathy  Alcohol withdrawal syndrome with complication [facility])     MEDICATIONS GIVEN DURING THIS VISIT:  Medications  LORazepam (ATIVAN) tablet 2 mg (2 mg Oral Not Given 11/19/20 0550)  gabapentin (NEURONTIN) capsule 300 mg (300 mg Oral Not Given 11/19/20 0554)     ED Discharge Orders         Ordered    chlordiazePOXIDE (LIBRIUM) 5 MG capsule        11/19/20 0548    gabapentin (NEURONTIN) 300 MG capsule  3 times daily        11/19/20 0548          *  Please note:  SYDNEI Page was evaluated in Emergency Department on 11/19/2020 for the symptoms described in the history of present illness. She was evaluated in the context of the global COVID-19 pandemic, which necessitated consideration that the patient might be at risk for infection with  the SARS-CoV-2 virus that causes COVID-19. Institutional protocols and algorithms that pertain to the evaluation of patients at risk for COVID-19 are in a state of rapid change based on information released by regulatory bodies including the CDC and federal and state organizations. These policies and algorithms were followed during the patient's care in the ED.  Some ED evaluations and interventions may be delayed as a result of limited staffing during and after the pandemic.*  Note:  This document was prepared using Dragon voice recognition software and may include unintentional dictation errors.   Hinda Kehr, MD 11/19/20 [phone]

## 2020-11-19 NOTE — ED Triage Notes (Signed)
Pt states coming in for pain to bilateral feet that has been going on for over a week. Pt states that she has been seen here and at [facility] in the past week. Pt states she was diagnosed with neuropathy, but the pain is really bad.  Pt states it feels like I am walking on glass

## 2020-11-19 NOTE — ED Notes (Signed)
Pt in hallway requesting to leave AMA due to the MD not prescribing her medication of her choice. Pt advised to followup with PCP or specialist. Pt also educated on the importance of treatment and staying to finish care but pt still refuses.  Printed AMA form signed and placed into medical records bin. MD and pts RN notified of change in care.

## 2020-12-06 ENCOUNTER — Inpatient Hospital Stay: Payer: Self-pay | Attending: Oncology

## 2020-12-07 ENCOUNTER — Inpatient Hospital Stay: Payer: Self-pay | Admitting: Oncology

## 2020-12-07 NOTE — Progress Notes (Signed)
This encounter was created in error - please disregard.

## 2020-12-10 ENCOUNTER — Other Ambulatory Visit: Payer: Self-pay

## 2020-12-10 ENCOUNTER — Emergency Department: Payer: Self-pay

## 2020-12-10 ENCOUNTER — Emergency Department
Admission: EM | Admit: 2020-12-10 | Discharge: 2020-12-10 | Disposition: A | Discharge status: Home / Self Care | Payer: Self-pay | Attending: Emergency Medicine | Admitting: Emergency Medicine

## 2020-12-10 DIAGNOSIS — R569 Unspecified convulsions: Secondary | ICD-10-CM | POA: Insufficient documentation

## 2020-12-10 DIAGNOSIS — F17210 Nicotine dependence, cigarettes, uncomplicated: Secondary | ICD-10-CM | POA: Insufficient documentation

## 2020-12-10 DIAGNOSIS — R251 Tremor, unspecified: Secondary | ICD-10-CM | POA: Insufficient documentation

## 2020-12-10 DIAGNOSIS — R410 Disorientation, unspecified: Secondary | ICD-10-CM | POA: Insufficient documentation

## 2020-12-10 LAB — COMPREHENSIVE METABOLIC PANEL
ALT: 19 U/L (ref 0–44)
AST: 37 U/L (ref 15–41)
Albumin: 3.7 g/dL (ref 3.5–5.0)
Alkaline Phosphatase: 86 U/L (ref 38–126)
Anion gap: 13 (ref 5–15)
BUN: 11 mg/dL (ref 6–20)
CO2: 26 mmol/L (ref 22–32)
Calcium: 9.8 mg/dL (ref 8.9–10.3)
Chloride: 102 mmol/L (ref 98–111)
Creatinine, Ser: 1.14 mg/dL — ABNORMAL HIGH (ref 0.44–1.00)
GFR, Estimated: >60 mL/min (ref >60)
Glucose, Bld: 87 mg/dL (ref 70–99)
Potassium: 2.8 mmol/L — ABNORMAL LOW (ref 3.5–5.1)
Sodium: 141 mmol/L (ref 135–145)
Total Bilirubin: 1.5 mg/dL — ABNORMAL HIGH (ref 0.3–1.2)
Total Protein: 7.3 g/dL (ref 6.5–8.1)

## 2020-12-10 LAB — CBC WITH DIFFERENTIAL/PLATELET
Abs Immature Granulocytes: 0.03 10*3/uL (ref 0.00–0.07)
Basophils Absolute: 0 10*3/uL (ref 0.0–0.1)
Basophils Relative: 1 %
Eosinophils Absolute: 0.1 10*3/uL (ref 0.0–0.5)
Eosinophils Relative: 2 %
HCT: 37 % (ref 36.0–46.0)
Hemoglobin: 11.9 g/dL — ABNORMAL LOW (ref 12.0–15.0)
Immature Granulocytes: 0 %
Lymphocytes Relative: 17 %
Lymphs Abs: 1.3 10*3/uL (ref 0.7–4.0)
MCH: 28.9 pg (ref 26.0–34.0)
MCHC: 32.2 g/dL (ref 30.0–36.0)
MCV: 89.8 fL (ref 80.0–100.0)
Monocytes Absolute: 0.3 10*3/uL (ref 0.1–1.0)
Monocytes Relative: 4 %
Neutro Abs: 6.1 10*3/uL (ref 1.7–7.7)
Neutrophils Relative %: 76 %
Platelets: 152 10*3/uL (ref 150–400)
RBC: 4.12 MIL/uL (ref 3.87–5.11)
RDW: 21.8 % — ABNORMAL HIGH (ref 11.5–15.5)
Smear Review: NORMAL
WBC: 8 10*3/uL (ref 4.0–10.5)
nRBC: 0 % (ref 0.0–0.2)

## 2020-12-10 LAB — ETHANOL: Alcohol, Ethyl (B): <10 mg/dL (ref <10)

## 2020-12-10 LAB — MAGNESIUM: Magnesium: 1.5 mg/dL — ABNORMAL LOW (ref 1.7–2.4)

## 2020-12-10 LAB — LACTIC ACID, PLASMA
Lactic Acid, Venous: 2.1 mmol/L (ref 0.5–1.9)
Lactic Acid, Venous: 0.6 mmol/L (ref 0.5–1.9)

## 2020-12-10 LAB — CBG MONITORING, ED: Glucose-Capillary: 94 mg/dL (ref 70–99)

## 2020-12-10 LAB — SALICYLATE LEVEL: Salicylate Lvl: <7 mg/dL — ABNORMAL LOW (ref 7.0–30.0)

## 2020-12-10 LAB — ACETAMINOPHEN LEVEL: Acetaminophen (Tylenol), Serum: <10 ug/mL — ABNORMAL LOW (ref 10–30)

## 2020-12-10 MED ORDER — LORAZEPAM 2 MG PO TABS
2.0000 mg | ORAL_TABLET | Freq: Once | ORAL | Status: AC
  Administered 2020-12-10: 2 mg via ORAL
  Filled 2020-12-10: qty 1

## 2020-12-10 MED ORDER — POTASSIUM CHLORIDE CRYS ER 20 MEQ PO TBCR
40.0000 meq | EXTENDED_RELEASE_TABLET | Freq: Once | ORAL | Status: AC
  Administered 2020-12-10: 40 meq via ORAL
  Filled 2020-12-10: qty 2

## 2020-12-10 MED ORDER — GABAPENTIN 300 MG PO CAPS
600.0000 mg | ORAL_CAPSULE | ORAL | Status: AC
  Administered 2020-12-10: 600 mg via ORAL
  Filled 2020-12-10: qty 2

## 2020-12-10 MED ORDER — LORAZEPAM 2 MG/ML IJ SOLN
1.0000 mg | Freq: Once | INTRAMUSCULAR | Status: AC
  Administered 2020-12-10: 1 mg via INTRAVENOUS
  Filled 2020-12-10: qty 1

## 2020-12-10 MED ORDER — LACTATED RINGERS IV BOLUS
1000.0000 mL | Freq: Once | INTRAVENOUS | Status: AC
  Administered 2020-12-10: 1000 mL via INTRAVENOUS

## 2020-12-10 MED ORDER — THIAMINE HCL 100 MG/ML IJ SOLN
100.0000 mg | Freq: Once | INTRAMUSCULAR | Status: AC
  Administered 2020-12-10: 100 mg via INTRAVENOUS
  Filled 2020-12-10: qty 2

## 2020-12-10 MED ORDER — LORAZEPAM 2 MG/ML IJ SOLN: 1.0000 mg | Freq: Once | INTRAMUSCULAR | Status: DC

## 2020-12-10 MED ORDER — MAGNESIUM SULFATE 2 GM/50ML IV SOLN
2.0000 g | Freq: Once | INTRAVENOUS | Status: AC
  Administered 2020-12-10: 2 g via INTRAVENOUS
  Filled 2020-12-10: qty 50

## 2020-12-10 NOTE — ED Triage Notes (Signed)
Pt brought in by EMS. PT was playing monopoly when family saw her stare to the side and her eyes rolled to the back of her head. Pts family lowered her to the ground as she started to shake and became incontinent of her bladder.

## 2020-12-10 NOTE — ED Notes (Signed)
Patient transported to CT 

## 2020-12-10 NOTE — Discharge Instructions (Signed)
You have been seen in the emergency department today for a likely seizure.  Your workup today including labs are reassuring as was the CT scan of your head.  Sometimes people can have seizures as a result of medications or lack of medications, alcohol or alcohol withdrawal, etc. The neurologist generally recommend that people not be started on antiepileptic drugs after a single seizure-like episode.    Please follow up with your doctor as soon as possible regarding today's emergency department visit and your likely seizure.  You will also need to follow up with a neurologist as soon as possible, please call for appointment.  As we have discussed it is very important that you DO NOT drive until you have been seen and cleared by your neurologist.  Please drink plenty of fluids, get plenty of sleep and avoid any alcohol or drug use.  Return to the emergency department if you have any further seizures, develop any weakness/numbness of any arm/leg, confusion, slurred speech, or sudden/severe headache.

## 2020-12-10 NOTE — ED Provider Notes (Signed)
[facility] Emergency Department Provider Note  ____________________________________________   Event Date/Time   First MD Initiated Contact with Patient 12/10/20 0112     (approximate)  I have reviewed the triage vital signs and the nursing notes.   HISTORY  Chief Complaint Seizures (Witnessed seizure.)  Level 5 caveat: History may be limited by post ictal state.  HPI Katrina Page is a 40 y.o. female with medical and psychiatric/social history as listed  below who presents by EMS for evaluation after possible seizure.  Reportedly she was having game night with her family, playing Monopoly, when she went unresponsive with her eyes rolled back in her head and had some generalized shaking.  She was lowered to the ground so she did not hit her head on anything.  She was shaking all over and lost control of her bladder.  She was little bit confused when EMS arrived and remembered nothing of what happened.  She realized that she had lost control of her bladder on the way to the emergency department and she was sobbing and upset about coming and when she arrived.  She says that she does not think she needs to be here and she does not think anything happened.  She does not remember anything happening other than that she was playing Monopoly with her family.  She denies fever, headache, neck pain, chest pain, shortness of breath, nausea, vomiting, and abdominal pain.  She has chronic pain in her back and her legs due to neuropathy.  She takes gabapentin regularly.  She has a history of alcohol abuse but states that she has not had anything to drink for a couple of days.  She said that she has "several shots of liquor from time to time", though her medical record indicates a history of alcohol abuse.  She denies drug use.  She says she has been taking all of her medications.  The onset of the episode was acute and it was severe.  Nothing in particular made it better or worse.        Past Medical History:  Diagnosis Date  . Adrenal nodule ([place]) 06/2019   left  . Alcohol abuse   . Anemia   . Bronchitis   . Depression   . History of kidney stones   . Migraines   . Nodule of right lung 06/2019  . Pulmonary emboli ([place]) 07/23/2019   bilateral  . Thrombocytopenia [facility])     Patient Active Problem List   Diagnosis Date Noted  . Nausea and vomiting 04/03/2020  . Refractory nausea and vomiting 04/02/2020  . SIRS (systemic inflammatory response syndrome) (HCC)   . Nausea   . Intractable vomiting   . Intractable vomiting with nausea 03/24/2020  . AKI (acute kidney injury) ([place]) 03/24/2020  . Anemia 03/24/2020  . Thrombocytopenia ([place]) 02/10/2020  . Intractable nausea and vomiting 02/09/2020  . Hypokalemia due to excessive gastrointestinal loss of potassium   . Hypomagnesemia   . Left adrenal mass ([place])   . Obstructive uropathy   . Severe recurrent major depression without psychotic features ([place]) 12/29/2019  . Suicide attempt by drug overdose ([place]) 12/29/2019  . Chronic back pain 12/29/2019  . Nicotine abuse 12/29/2019  . MDD (major depressive disorder) 12/28/2019  . MDD (major depressive disorder), severe ([place]) 12/24/2019  . Alcohol abuse 12/24/2019  . Left renal stone 08/25/2019  . Chest pain 07/24/2019  . Dyspnea 07/24/2019  . Tobacco use 07/24/2019  . Pulmonary embolism ([place]) 07/23/2019  .  Sepsis ([place]) 11/29/2018    Past Surgical History:  Procedure Laterality Date  . APPENDECTOMY    . CHOLECYSTECTOMY    . CYSTOSCOPY W/ RETROGRADES Left 03/05/2020   Procedure: CYSTOSCOPY WITH RETROGRADE PYELOGRAM;  Surgeon: Hollice Espy, MD;  Location: [facility] ORS;  Service: Urology;  Laterality: Left;  . CYSTOSCOPY WITH STENT PLACEMENT Left 02/09/2020   Procedure: CYSTOSCOPY WITH STENT PLACEMENT;  Surgeon: Hollice Espy, MD;  Location: [facility] ORS;  Service: Urology;  Laterality: Left;  . CYSTOSCOPY/URETEROSCOPY/HOLMIUM LASER/STENT PLACEMENT Left 03/05/2020    Procedure: CYSTOSCOPY/URETEROSCOPY/HOLMIUM LASER/STENT EXCHANGE;  Surgeon: Hollice Espy, MD;  Location: [facility] ORS;  Service: Urology;  Laterality: Left;  . ESOPHAGOGASTRODUODENOSCOPY (EGD) WITH PROPOFOL N/A 03/27/2020   Procedure: ESOPHAGOGASTRODUODENOSCOPY (EGD) WITH PROPOFOL;  Surgeon: Lucilla Lame, MD;  Location: [facility] ENDOSCOPY;  Service: Endoscopy;  Laterality: N/A;  . TUBAL LIGATION      Prior to Admission medications   Medication Sig Start Date End Date Taking? Authorizing Provider  albuterol (VENTOLIN HFA) 108 (90 Base) MCG/ACT inhaler Inhale 1-2 puffs into the lungs every 4 (four) hours as needed for wheezing or shortness of breath. 01/02/20   Clapacs, Madie Reno, MD  chlordiazePOXIDE (LIBRIUM) 5 MG capsule Take 6 caps (30 mg) 4x daily on day 1. Take 5 caps (25 mg) 4x daily on day 2. Take 4 caps (20 mg) 4x daily on day 3. Take 3 caps (15 mg) 4x daily on day 4. Take 2 caps (10 mg) 4x daily on day 5. Take 2 caps (10 mg) 3x daily on day 6. Take 1 cap (5 mg) 3x daily on day 7. Take 1 cap (5 mg) twice daily on day 8. Take 1 cap (5 mg) in the evening on day 9. 11/19/20   Hinda Kehr, MD  famotidine (PEPCID) 20 MG tablet Take 1 tablet (20 mg total) by mouth 2 (two) times daily. 11/10/20   Carrie Mew, MD  Folic Acid-Vit F0-XNA T55 0.4-50-0.1 MG TABS SMARTSIG:1 Tablet(s) By Mouth Daily 03/29/20   [provider]  gabapentin (NEURONTIN) 300 MG capsule Take 1 capsule (300 mg total) by mouth 3 (three) times daily. 11/19/20 11/19/21  Hinda Kehr, MD  guaiFENesin-codeine 100-10 MG/5ML syrup Take 5 mLs by mouth every 6 (six) hours as needed for cough. 08/27/20   Johnn Hai, PA-C  hydrOXYzine (ATARAX/VISTARIL) 50 MG tablet Take 1 tablet (50 mg total) by mouth 3 (three) times daily as needed for anxiety. 03/27/20   Fritzi Mandes, MD  meloxicam (MOBIC) 15 MG tablet Take 1 tablet (15 mg total) by mouth daily. 09/17/20 09/17/21  Lannie Fields, PA-C  ondansetron (ZOFRAN ODT) 4 MG disintegrating  tablet Take 1 tablet (4 mg total) by mouth every 8 (eight) hours as needed for nausea or vomiting. 11/10/20   Carrie Mew, MD  oxybutynin (DITROPAN) 5 MG tablet Take 1 tablet (5 mg total) by mouth every 8 (eight) hours as needed for bladder spasms. 02/12/20   Lorella Nimrod, MD  pantoprazole (PROTONIX) 40 MG tablet Take 1 tablet (40 mg total) by mouth daily. 03/27/20   Fritzi Mandes, MD  Potassium Chloride ER 20 MEQ TBCR Take 20 mEq by mouth daily.  03/29/20   [provider]  promethazine (PHENERGAN) 25 MG tablet Take 1 tablet (25 mg total) by mouth every 6 (six) hours as needed for nausea or vomiting. 08/27/20   Johnn Hai, PA-C  Venlafaxine HCl 150 MG TB24 Take 1 tablet by mouth daily. 05/07/20   [provider]  vitamin B-12 1000  MCG tablet Take 1 tablet (1,000 mcg total) by mouth daily. 02/13/20   Lorella Nimrod, MD    Allergies Patient has no known allergies.  Family History  Problem Relation Age of Onset  . Hypertension Mother   . Diabetes Mother   . Stroke Mother   . Breast cancer Mother     Social History Social History   Tobacco Use  . Smoking status: Current Every Day Smoker    Packs/day: 0.25    Years: 20.00    Pack years: 5.00    Types: Cigarettes  . Smokeless tobacco: Never Used  Vaping Use  . Vaping Use: Never used  Substance Use Topics  . Alcohol use: Yes    Alcohol/week: 6.0 standard drinks    Types: 6 Shots of liquor per week  . Drug use: No    Review of Systems Constitutional: No fever/chills Eyes: No visual changes. ENT: No sore throat. Cardiovascular: Denies chest pain. Respiratory: Denies shortness of breath. Gastrointestinal: No abdominal pain.  No nausea, no vomiting.  No diarrhea.  No constipation. Genitourinary: Negative for dysuria. Musculoskeletal: Chronic pain primarily in her legs and back. Integumentary: Negative for rash. Neurological: Possible seizure-like activity.  Chronic neuropathy.  Negative for headaches, focal  weakness or numbness.   ____________________________________________   PHYSICAL EXAM:  VITAL SIGNS: ED Triage Vitals  Enc Vitals Group     BP 12/10/20 0128 (!) 144/102     Pulse Rate 12/10/20 0128 (!) 118     Resp --      Temp 12/10/20 0128 98.6 F (37 C)     Temp Source 12/10/20 0128 Oral     SpO2 12/10/20 0128 100 %     Weight 12/10/20 0130 80 kg (176 lb 5.9 oz)     Height 12/10/20 0130 1.753 m (5\' 9" )     Head Circumference --      [place] Flow --      Pain Score 12/10/20 0130 9     Pain Loc --      Pain Edu? --      Excl. in [place]? --     Constitutional: Alert and oriented.  Sobbing and upset. Eyes: Conjunctivae are normal.  Pupils are slightly dilated bilaterally but reactive to light. Head: Atraumatic. Nose: No congestion/rhinnorhea. Mouth/Throat: Patient is wearing a mask. Neck: No stridor.  No meningeal signs.   Cardiovascular: Mild tachycardia, regular rhythm. Good peripheral circulation. Respiratory: Normal respiratory effort.  No retractions. Gastrointestinal: Soft and nontender. No distention.  Musculoskeletal: No lower extremity tenderness nor edema. No gross deformities of extremities. Neurologic:  Normal speech and language. No gross focal neurologic deficits are appreciated.  Good grip strength bilaterally, moving all 4 extremities without difficulty.  Very slight tremor when holding out her hands but unclear if this is due to her emotional upset or due to withdrawal symptoms. Skin:  Skin is warm, dry and intact. Psychiatric: Mood and affect are upset, sobbing, states she does not want to be here.  No signs of emergent psychiatric condition such as suicidal ideation.  ____________________________________________   LABS (all labs ordered are listed, but only abnormal results are displayed)  Labs Reviewed  CBC WITH DIFFERENTIAL/PLATELET - Abnormal; Notable for the following components:      Result Value   Hemoglobin 11.9 (*)    RDW 21.8 (*)    All other  components within normal limits  MAGNESIUM - Abnormal; Notable for the following components:   Magnesium 1.5 (*)    All other  components within normal limits  COMPREHENSIVE METABOLIC PANEL - Abnormal; Notable for the following components:   Potassium 2.8 (*)    Creatinine, Ser 1.14 (*)    Total Bilirubin 1.5 (*)    All other components within normal limits  ACETAMINOPHEN LEVEL - Abnormal; Notable for the following components:   Acetaminophen (Tylenol), Serum <10 (*)    All other components within normal limits  SALICYLATE LEVEL - Abnormal; Notable for the following components:   Salicylate Lvl <8.5 (*)    All other components within normal limits  LACTIC ACID, PLASMA - Abnormal; Notable for the following components:   Lactic Acid, Venous 2.1 (*)    All other components within normal limits  ETHANOL  LACTIC ACID, PLASMA  URINALYSIS, ROUTINE W REFLEX MICROSCOPIC  URINE DRUG SCREEN, QUALITATIVE ([facility] ONLY)  CBG MONITORING, ED  POC URINE PREG, ED   ____________________________________________  EKG  No indication for emergent EKG ____________________________________________  RADIOLOGY I, Hinda Kehr, personally viewed and evaluated these images (plain radiographs) as part of my medical decision making, as well as reviewing the written report by the radiologist.  ED MD interpretation: No acute abnormalities identified on head CT.  Official radiology report(s): CT HEAD WO CONTRAST  Result Date: 12/10/2020 CLINICAL DATA:  Severe tingling pain EXAM: CT HEAD WITHOUT CONTRAST TECHNIQUE: Contiguous axial images were obtained from the base of the skull through the vertex without intravenous contrast. COMPARISON:  September 07, 2013 FINDINGS: Brain: No evidence of acute territorial infarction, hemorrhage, hydrocephalus,extra-axial collection or mass lesion/mass effect. Normal gray-white differentiation. Ventricles are normal in size and contour. Vascular: No hyperdense vessel or unexpected  calcification. Skull: The skull is intact. No fracture or focal lesion identified. Sinuses/Orbits: The visualized paranasal sinuses and mastoid air cells are clear. The orbits and globes intact. Other: None IMPRESSION: No acute intracranial abnormality. Electronically Signed   By: Prudencio Pair M.D.   On: 12/10/2020 02:28    ____________________________________________   PROCEDURES   Procedure(s) performed (including Critical Care):  .1-3 Lead EKG Interpretation Performed by: Hinda Kehr, MD Authorized by: Hinda Kehr, MD     Interpretation: abnormal     ECG rate:  115   ECG rate assessment: tachycardic     Rhythm: sinus tachycardia     Ectopy: none     Conduction: normal       ____________________________________________   INITIAL IMPRESSION / MDM / ASSESSMENT AND PLAN / ED COURSE  As part of my medical decision making, I reviewed the following data within the [place] notes reviewed and incorporated, Labs reviewed , Old chart reviewed, Notes from prior ED visits and [place] Controlled Substance Database   Differential diagnosis includes, but is not limited to, epileptic seizure, nonepileptiform seizure, alcohol withdrawal, medication or drug side effect, electrolyte or metabolic abnormality.  The patient is on the cardiac monitor to evaluate for evidence of arrhythmia and/or significant heart rate changes.  Seizure precautions have been put in place.  Patient is awake and alert.  Per paramedics she was postictal and she has lost control of her bladder, both of which would suggest true seizure-like activity.  She reportedly has no history of seizures although her son apparently has had seizures although apparently this was in his childhood and he has not had any for years and is not on medication.  Her medical record indicates a history of alcohol abuse although she denies that this is currently an issue but she does states she has not had any  alcohol  for couple of days and that she has multiple shots of liquor with some frequency.  In the absence of any abnormal findings on the CT such as a malignancy, my highest suspicion is for a withdrawal seizure, either from alcohol or from other substance abuse.  However the patient states repeatedly that alcohol is not an ongoing problem for her and she also states that she does not want to be here and did not want to come.  I reassured her and explained that we would get a head CT and check some labs.  I explained that we do not have to keep her in the hospital if she does not want to be here but we need to make sure there is nothing emergent that needs to be taken care of.  She understands and agrees with the plan although she remains upset and crying softly.  I am also giving thiamine 100 mg IV and will evaluate with labs to determine if there is any indication of more emergent conditions such as alcoholic ketoacidosis.  The patient's nurse, Margreta Journey, has been with me throughout the initial interview and physical exam and has been a witness to our interactions, discussion of her past medical history, history of present illness, and our discussion about her plan of care.     Clinical Course as of 12/10/20 0616  Mon Dec 10, 2020  1308 CT HEAD WO CONTRAST Reassuring head CT with no acute abnormality identified [CF]  0343 Labs are generally reassuring.  I believe the lactic acid 2.1 is not indicative of sepsis but rather suggestive of seizure disorder.  I ordered lactated Ringer's 1 L and we will repeat to see if it is coming down after the liter of fluids.  Her magnesium level was 1.5 and I ordered to grams of IV magnesium.  Her potassium was 2.8 and I ordered 40 mEq potassium by mouth.  CBC is normal.  Salicylate and acetaminophen levels and ethanol level are negative.  Urine has not been provided to [place] to test. [CF]  0344 Patient was requesting her usual gabapentin which I provided (600 mg by mouth).  She  then requested oxycodone which I declined based on history and no indication for narcotics at this time as well as no desire to further worsen mental status particular in the setting of recent Ativan administration. [CF]  0603 Lactic Acid, Venous: 0.6 Complete resolution of elevated lactic acid, most probably consistent with seizure-like activity.  Patient is tolerating oral intake.  She is still upset and was mildly tachycardic and I suspect there may be more of a component of alcohol withdrawal than she is initially indicating but fortunately there is no evidence of an emergent medical condition at this time.  She received an additional dose of Ativan 2 mg by mouth previously.  Her tachycardia has improved to about 100.  She has no infectious signs or symptoms, afebrile, no leukocytosis, and the isolated tachycardia is likely the result of the emotional upset, her chronic pain, alcohol withdrawal, and/or narcotics withdrawal.  I am discharging her for close outpatient follow-up and giving her contact information for Dr. Melrose Nakayama.  I gave my usual and customary seizure recommendations including to not drive until she is further evaluated and my usual return precautions. [CF]    Clinical Course User Index [CF] Hinda Kehr, MD     ____________________________________________  FINAL CLINICAL IMPRESSION(S) / ED DIAGNOSES  Final diagnoses:  Seizure-like activity ([place])     MEDICATIONS GIVEN  DURING THIS VISIT:  Medications  thiamine (B-1) injection 100 mg (100 mg Intravenous Given 12/10/20 0221)  LORazepam (ATIVAN) injection 1 mg (1 mg Intravenous Given 12/10/20 0220)  gabapentin (NEURONTIN) capsule 600 mg (600 mg Oral Given 12/10/20 0244)  lactated ringers bolus 1,000 mL (0 mLs Intravenous Stopped 12/10/20 0406)  magnesium sulfate IVPB 2 g 50 mL (0 g Intravenous Stopped 12/10/20 0533)  potassium chloride SA (KLOR-CON) CR tablet 40 mEq (40 mEq Oral Given 12/10/20 0401)  LORazepam (ATIVAN) tablet 2 mg  (2 mg Oral Given 12/10/20 0541)     ED Discharge Orders    None      *Please note:  Katrina Page was evaluated in Emergency Department on 12/10/2020 for the symptoms described in the history of present illness. She was evaluated in the context of the global COVID-19 pandemic, which necessitated consideration that the patient might be at risk for infection with the SARS-CoV-2 virus that causes COVID-19. Institutional protocols and algorithms that pertain to the evaluation of patients at risk for COVID-19 are in a state of rapid change based on information released by regulatory bodies including the CDC and federal and state organizations. These policies and algorithms were followed during the patient's care in the ED.  Some ED evaluations and interventions may be delayed as a result of limited staffing during and after the pandemic.*  Note:  This document was prepared using Dragon voice recognition software and may include unintentional dictation errors.   Hinda Kehr, MD 12/10/20 [phone]

## 2021-06-06 ENCOUNTER — Other Ambulatory Visit: Payer: Self-pay

## 2021-06-06 ENCOUNTER — Encounter: Payer: Self-pay | Admitting: Emergency Medicine

## 2021-06-06 ENCOUNTER — Emergency Department
Admission: EM | Admit: 2021-06-06 | Discharge: 2021-06-06 | Disposition: A | Discharge status: Home / Self Care | Payer: Self-pay | Attending: Emergency Medicine | Admitting: Emergency Medicine

## 2021-06-06 DIAGNOSIS — F322 Major depressive disorder, single episode, severe without psychotic features: Secondary | ICD-10-CM | POA: Diagnosis present

## 2021-06-06 DIAGNOSIS — T50902A Poisoning by unspecified drugs, medicaments and biological substances, intentional self-harm, initial encounter: Secondary | ICD-10-CM | POA: Diagnosis present

## 2021-06-06 DIAGNOSIS — Z008 Encounter for other general examination: Secondary | ICD-10-CM

## 2021-06-06 DIAGNOSIS — F329 Major depressive disorder, single episode, unspecified: Secondary | ICD-10-CM | POA: Diagnosis present

## 2021-06-06 DIAGNOSIS — F1010 Alcohol abuse, uncomplicated: Secondary | ICD-10-CM | POA: Diagnosis present

## 2021-06-06 DIAGNOSIS — Z720 Tobacco use: Secondary | ICD-10-CM | POA: Diagnosis present

## 2021-06-06 DIAGNOSIS — Z9151 Personal history of suicidal behavior: Secondary | ICD-10-CM | POA: Diagnosis present

## 2021-06-06 DIAGNOSIS — F17210 Nicotine dependence, cigarettes, uncomplicated: Secondary | ICD-10-CM | POA: Insufficient documentation

## 2021-06-06 DIAGNOSIS — F332 Major depressive disorder, recurrent severe without psychotic features: Secondary | ICD-10-CM | POA: Insufficient documentation

## 2021-06-06 DIAGNOSIS — Z046 Encounter for general psychiatric examination, requested by authority: Secondary | ICD-10-CM | POA: Insufficient documentation

## 2021-06-06 DIAGNOSIS — R45851 Suicidal ideations: Secondary | ICD-10-CM | POA: Insufficient documentation

## 2021-06-06 DIAGNOSIS — Y906 Blood alcohol level of 120-199 mg/100 ml: Secondary | ICD-10-CM | POA: Insufficient documentation

## 2021-06-06 LAB — URINE DRUG SCREEN, QUALITATIVE (ARMC ONLY)
Amphetamines, Ur Screen: NOT DETECTED
Barbiturates, Ur Screen: NOT DETECTED
Benzodiazepine, Ur Scrn: POSITIVE — AB
Cannabinoid 50 Ng, Ur ~~LOC~~: POSITIVE — AB
Cocaine Metabolite,Ur ~~LOC~~: NOT DETECTED
MDMA (Ecstasy)Ur Screen: NOT DETECTED
Methadone Scn, Ur: NOT DETECTED
Opiate, Ur Screen: NOT DETECTED
Phencyclidine (PCP) Ur S: NOT DETECTED
Tricyclic, Ur Screen: POSITIVE — AB

## 2021-06-06 LAB — CBC
HCT: 38.2 % (ref 36.0–46.0)
Hemoglobin: 11.8 g/dL — ABNORMAL LOW (ref 12.0–15.0)
MCH: 24.4 pg — ABNORMAL LOW (ref 26.0–34.0)
MCHC: 30.9 g/dL (ref 30.0–36.0)
MCV: 79.1 fL — ABNORMAL LOW (ref 80.0–100.0)
Platelets: 164 10*3/uL (ref 150–400)
RBC: 4.83 MIL/uL (ref 3.87–5.11)
RDW: 22.4 % — ABNORMAL HIGH (ref 11.5–15.5)
WBC: 6.1 10*3/uL (ref 4.0–10.5)
nRBC: 0 % (ref 0.0–0.2)

## 2021-06-06 LAB — COMPREHENSIVE METABOLIC PANEL
ALT: 19 U/L (ref 0–44)
AST: 39 U/L (ref 15–41)
Albumin: 4.3 g/dL (ref 3.5–5.0)
Alkaline Phosphatase: 58 U/L (ref 38–126)
Anion gap: 12 (ref 5–15)
BUN: 9 mg/dL (ref 6–20)
CO2: 26 mmol/L (ref 22–32)
Calcium: 9.2 mg/dL (ref 8.9–10.3)
Chloride: 101 mmol/L (ref 98–111)
Creatinine, Ser: 1.19 mg/dL — ABNORMAL HIGH (ref 0.44–1.00)
GFR, Estimated: 59 mL/min — ABNORMAL LOW (ref >60)
Glucose, Bld: 96 mg/dL (ref 70–99)
Potassium: 4 mmol/L (ref 3.5–5.1)
Sodium: 139 mmol/L (ref 135–145)
Total Bilirubin: 0.5 mg/dL (ref 0.3–1.2)
Total Protein: 8.2 g/dL — ABNORMAL HIGH (ref 6.5–8.1)

## 2021-06-06 LAB — ETHANOL: Alcohol, Ethyl (B): 153 mg/dL — ABNORMAL HIGH (ref <10)

## 2021-06-06 LAB — PREGNANCY, URINE: Preg Test, Ur: NEGATIVE

## 2021-06-06 LAB — SALICYLATE LEVEL: Salicylate Lvl: <7 mg/dL — ABNORMAL LOW (ref 7.0–30.0)

## 2021-06-06 LAB — ACETAMINOPHEN LEVEL: Acetaminophen (Tylenol), Serum: <10 ug/mL — ABNORMAL LOW (ref 10–30)

## 2021-06-06 NOTE — Discharge Instructions (Addendum)
You have been seen in the Emergency Department (ED)  today for a psychiatric complaint.  You have been evaluated by psychiatry and we believe you are safe to be discharged from the hospital.   ° °Please return to the Emergency Department (ED)  immediately if you have ANY thoughts of hurting yourself or anyone else, so that we may help you. ° °Please avoid alcohol and drug use. ° °Follow up with your doctor and/or therapist as soon as possible regarding today's ED  visit.  ° °You may call crisis hotline for [place] at [phone]. ° °
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
## 2021-06-06 NOTE — Consult Note (Signed)
[facility] Consult   Reason for Consult: Mental Health Problem  Referring Physician: Dr. Alfred Levins  Patient Identification: Katrina Page MRN:  [id number] Principal Diagnosis: <principal problem not specified> Diagnosis:  Active Problems:   Tobacco use   MDD (major depressive disorder), severe (HCC)   Alcohol abuse   MDD (major depressive disorder)   Severe recurrent major depression without psychotic features ([place])   Suicide attempt by drug overdose ([place])   Nicotine abuse   Total Time spent with patient: 1 hour  Subjective: "I was trying to leave my home to get away from everyone and they told the cops I wanted to kill myself. Katrina Page is a 40 y.o. female patient presented to  [facility] ED via law enforcement voluntary. The patient shared that she has an outpatient provider who manages her medications. She sees a provider at [facility]. The patient's BAL is 153 mg/dl; she is positive for THC and benzos, and her PDMP review shows that she was last prescribed Clonazepam 0.5 Mg Tablet, which was filled on 06.11.22. The patient recently got out of jail two days ago. When she returned home, her kids, who live with the patient's mother, had taken all her stuff out of the house, including appliances, TV, and Xbox. The patient went to her mother's house and requested to get her things back, including a charger for her phone. Her kids said no, so she called the police. The police told her to leave their house and go back to her house. Once the police left, her kids came over to her porch and started arguing with her. The police were called again, and when they arrived, the patient's kids told them that patient was suicidal, which prompted her visit to the emergency room.   The patient was seen face-to-face by this provider; the chart was reviewed and consulted with Dr. Alfred Levins on 06/06/2021 due to the patient's care. It was discussed with the EDP that the patient does not meet the criteria  to be admitted to the psychiatric inpatient unit.  On evaluation, the patient is alert and oriented x4, irritated but cooperative, and mood-congruent with affect.  The patient does not appear to be responding to internal or external stimuli. Neither is the patient presenting with any delusional thinking. The patient is not presenting with any psychotic or paranoid behaviors. The patient denies auditory or visual hallucinations. The patient denies any suicidal, homicidal, or self-harm ideations. The patient shared she was last hospitalized a year ago for a suicide attempt, and she stayed inpatient for 12 days. She shared that she attempted by taking a hand full of pills. On today's visit, she denied any thoughts of suicide. During an encounter with the patient, she could answer questions appropriately. The patient's brother Georgie Chard [phone]) provided collateral by voicing that he does not think his sister is a danger to herself. He does admit that she was in jail and recently was released. He shared that she had asked him to borrow his car, which he did. He did not think she was a danger to herself.   HPI: Per Dr. Alfred Levins, Katrina Page is a 40 y.o. female with a history of alcohol abuse, suicide attempt by overdose, depression who was brought in by [facility] PD for suicidal thoughts.  According to patient she recently got out of jail 2 days ago.  When she returned home, her kids, who live with patient's mother, had taken all of her stuff out of the house  including appliances, TV, Xbox.  Patient went over to her mother's house and requested to get some of her stuff back including a charger for her phone.  Her kids said no, so she called the police.  The police told her to leave their house and go back to her house.  Once the police left, her kids came over to her porch and started arguing with her.  The police was called again, and when they arrived, patient's kids told them that patient was  suicidal which prompted her visit to the emergency room.  Patient denies any suicidal or homicidal thoughts.  She does have a history of prior suicide attempt.  Past Psychiatric History:  Alcohol abuse Depression  Risk to Self:   Risk to Others:   Prior Inpatient Therapy:   Prior Outpatient Therapy:    Past Medical History:  Past Medical History:  Diagnosis Date   Adrenal nodule ([place]) 06/2019   left   Alcohol abuse    Anemia    Bronchitis    Depression    History of kidney stones    Migraines    Nodule of right lung 06/2019   Pulmonary emboli ([place]) 07/23/2019   bilateral   Thrombocytopenia ([place])     Past Surgical History:  Procedure Laterality Date   APPENDECTOMY     CHOLECYSTECTOMY     CYSTOSCOPY W/ RETROGRADES Left 03/05/2020   Procedure: CYSTOSCOPY WITH RETROGRADE PYELOGRAM;  Surgeon: Hollice Espy, MD;  Location: [facility] ORS;  Service: Urology;  Laterality: Left;   CYSTOSCOPY WITH STENT PLACEMENT Left 02/09/2020   Procedure: CYSTOSCOPY WITH STENT PLACEMENT;  Surgeon: Hollice Espy, MD;  Location: [facility] ORS;  Service: Urology;  Laterality: Left;   CYSTOSCOPY/URETEROSCOPY/HOLMIUM LASER/STENT PLACEMENT Left 03/05/2020   Procedure: CYSTOSCOPY/URETEROSCOPY/HOLMIUM LASER/STENT EXCHANGE;  Surgeon: Hollice Espy, MD;  Location: [facility] ORS;  Service: Urology;  Laterality: Left;   ESOPHAGOGASTRODUODENOSCOPY (EGD) WITH PROPOFOL N/A 03/27/2020   Procedure: ESOPHAGOGASTRODUODENOSCOPY (EGD) WITH PROPOFOL;  Surgeon: Lucilla Lame, MD;  Location: [facility] ENDOSCOPY;  Service: Endoscopy;  Laterality: N/A;   TUBAL LIGATION     Family History:  Family History  Problem Relation Age of Onset   Hypertension Mother    Diabetes Mother    Stroke Mother    Breast cancer Mother    Family Psychiatric  History:  Social History:  Social History   Substance and Sexual Activity  Alcohol Use Yes   Alcohol/week: 6.0 standard drinks   Types: 6 Shots of liquor per week     Social History    Substance and Sexual Activity  Drug Use No    Social History   Socioeconomic History   Marital status: Divorced    Spouse name: Not on file   Number of children: Not on file   Years of education: Not on file   Highest education level: Not on file  Occupational History   Not on file  Tobacco Use   Smoking status: Every Day    Packs/day: 0.25    Years: 20.00    Pack years: 5.00    Types: Cigarettes   Smokeless tobacco: Never  Vaping Use   Vaping Use: Never used  Substance and Sexual Activity   Alcohol use: Yes    Alcohol/week: 6.0 standard drinks    Types: 6 Shots of liquor per week   Drug use: No   Sexual activity: Not on file  Other Topics Concern   Not on file  Social History Narrative   Not on file  Social Determinants of Health   Financial Resource Strain: Not on file  Food Insecurity: Not on file  Transportation Needs: Not on file  Physical Activity: Not on file  Stress: Not on file  Social Connections: Not on file   Additional Social History:    Allergies:  No Known Allergies  Labs:  Results for orders placed or performed during the hospital encounter of 06/06/21 (from the past 48 hour(s))  CBC     Status: Abnormal   Collection Time: 06/06/21  1:56 AM  Result Value Ref Range   WBC 6.1 4.0 - 10.5 K/uL   RBC 4.83 3.87 - 5.11 MIL/uL   Hemoglobin 11.8 (L) 12.0 - 15.0 g/dL   HCT 38.2 36.0 - 46.0 %   MCV 79.1 (L) 80.0 - 100.0 fL   MCH 24.4 (L) 26.0 - 34.0 pg   MCHC 30.9 30.0 - 36.0 g/dL   RDW 22.4 (H) 11.5 - 15.5 %   Platelets 164 150 - 400 K/uL    Comment: PLATELET COUNT CONFIRMED BY SMEAR   nRBC 0.0 0.0 - 0.2 %    Comment: Performed at [facility], [address] [place], [place] [address]  Comprehensive metabolic panel     Status: Abnormal   Collection Time: 06/06/21  1:56 AM  Result Value Ref Range   Sodium 139 135 - 145 mmol/L   Potassium 4.0 3.5 - 5.1 mmol/L   Chloride 101 98 - 111 mmol/L   CO2 26 22 - 32 mmol/L   Glucose,  Bld 96 70 - 99 mg/dL    Comment: Glucose reference range applies only to samples taken after fasting for at least 8 hours.   BUN 9 6 - 20 mg/dL   Creatinine, Ser 1.19 (H) 0.44 - 1.00 mg/dL   Calcium 9.2 8.9 - 10.3 mg/dL   Total Protein 8.2 (H) 6.5 - 8.1 g/dL   Albumin 4.3 3.5 - 5.0 g/dL   AST 39 15 - 41 U/L   ALT 19 0 - 44 U/L   Alkaline Phosphatase 58 38 - 126 U/L   Total Bilirubin 0.5 0.3 - 1.2 mg/dL   GFR, Estimated 59 (L) >60 mL/min    Comment: (NOTE) Calculated using the CKD-EPI Creatinine Equation (2021)    Anion gap 12 5 - 15    Comment: Performed at [facility], [address] [place], [place] [address]  Ethanol     Status: Abnormal   Collection Time: 06/06/21  1:56 AM  Result Value Ref Range   Alcohol, Ethyl (B) 153 (H) <10 mg/dL    Comment: (NOTE) Lowest detectable limit for serum alcohol is 10 mg/dL.  For medical purposes only. Performed at [facility], [address] [place], [place] [id number]   Salicylate level     Status: Abnormal   Collection Time: 06/06/21  1:56 AM  Result Value Ref Range   Salicylate Lvl Q000111Q (L) 7.0 - 30.0 mg/dL    Comment: Performed at [facility], [place], [place] [address]  Acetaminophen level     Status: Abnormal   Collection Time: 06/06/21  1:56 AM  Result Value Ref Range   Acetaminophen (Tylenol), Serum <10 (L) 10 - 30 ug/mL    Comment: (NOTE) Therapeutic concentrations vary significantly. A range of 10-30 ug/mL  may be an effective concentration for many patients. However, some  are best treated at concentrations outside of this range. Acetaminophen concentrations >150 ug/mL at 4 hours after ingestion  and >  50 ug/mL at 12 hours after ingestion are often associated with  toxic reactions.  Performed at [facility], [address] [place], [place] [address]   Urine Drug Screen, Qualitative [facility] only)     Status: Abnormal   Collection Time: 06/06/21  1:59 AM  Result  Value Ref Range   Tricyclic, Ur Screen POSITIVE (A) NONE DETECTED   Amphetamines, Ur Screen NONE DETECTED NONE DETECTED   MDMA (Ecstasy)Ur Screen NONE DETECTED NONE DETECTED   Cocaine Metabolite,Ur [place] NONE DETECTED NONE DETECTED   Opiate, Ur Screen NONE DETECTED NONE DETECTED   Phencyclidine (PCP) Ur S NONE DETECTED NONE DETECTED   Cannabinoid 50 Ng, Ur [place] POSITIVE (A) NONE DETECTED   Barbiturates, Ur Screen NONE DETECTED NONE DETECTED   Benzodiazepine, Ur Scrn POSITIVE (A) NONE DETECTED   Methadone Scn, Ur NONE DETECTED NONE DETECTED    Comment: (NOTE) Tricyclics + metabolites, urine    Cutoff 1000 ng/mL Amphetamines + metabolites, urine  Cutoff 1000 ng/mL MDMA (Ecstasy), urine              Cutoff 500 ng/mL Cocaine Metabolite, urine          Cutoff 300 ng/mL Opiate + metabolites, urine        Cutoff 300 ng/mL Phencyclidine (PCP), urine         Cutoff 25 ng/mL Cannabinoid, urine                 Cutoff 50 ng/mL Barbiturates + metabolites, urine  Cutoff 200 ng/mL Benzodiazepine, urine              Cutoff 200 ng/mL Methadone, urine                   Cutoff 300 ng/mL  The urine drug screen provides only a preliminary, unconfirmed analytical test result and should not be used for non-medical purposes. Clinical consideration and professional judgment should be applied to any positive drug screen result due to possible interfering substances. A more specific alternate chemical method must be used in order to obtain a confirmed analytical result. Gas chromatography / mass spectrometry (GC/MS) is the preferred confirm atory method. Performed at [facility], [address] [place], [place] [address]     No current facility-administered medications for this encounter.   Current Outpatient Medications  Medication Sig Dispense Refill   albuterol (VENTOLIN HFA) 108 (90 Base) MCG/ACT inhaler Inhale 1-2 puffs into the lungs every 4 (four) hours as needed for wheezing or shortness  of breath. 6.7 g 1   chlordiazePOXIDE (LIBRIUM) 5 MG capsule Take 6 caps (30 mg) 4x daily on day 1. Take 5 caps (25 mg) 4x daily on day 2. Take 4 caps (20 mg) 4x daily on day 3. Take 3 caps (15 mg) 4x daily on day 4. Take 2 caps (10 mg) 4x daily on day 5. Take 2 caps (10 mg) 3x daily on day 6. Take 1 cap (5 mg) 3x daily on day 7. Take 1 cap (5 mg) twice daily on day 8. Take 1 cap (5 mg) in the evening on day 9. 92 capsule 0   famotidine (PEPCID) 20 MG tablet Take 1 tablet (20 mg total) by mouth 2 (two) times daily. 60 tablet 0   Folic Acid-Vit Q000111Q 123456 0.4-50-0.1 MG TABS SMARTSIG:1 Tablet(s) By Mouth Daily     gabapentin (NEURONTIN) 300 MG capsule Take 1 capsule (300 mg total) by mouth 3 (three) times daily. 90 capsule 2  guaiFENesin-codeine 100-10 MG/5ML syrup Take 5 mLs by mouth every 6 (six) hours as needed for cough. 120 mL 0   hydrOXYzine (ATARAX/VISTARIL) 50 MG tablet Take 1 tablet (50 mg total) by mouth 3 (three) times daily as needed for anxiety. 20 tablet 0   meloxicam (MOBIC) 15 MG tablet Take 1 tablet (15 mg total) by mouth daily. 30 tablet 2   ondansetron (ZOFRAN ODT) 4 MG disintegrating tablet Take 1 tablet (4 mg total) by mouth every 8 (eight) hours as needed for nausea or vomiting. 20 tablet 0   oxybutynin (DITROPAN) 5 MG tablet Take 1 tablet (5 mg total) by mouth every 8 (eight) hours as needed for bladder spasms. 30 tablet 0   pantoprazole (PROTONIX) 40 MG tablet Take 1 tablet (40 mg total) by mouth daily. 30 tablet 0   Potassium Chloride ER 20 MEQ TBCR Take 20 mEq by mouth daily.      promethazine (PHENERGAN) 25 MG tablet Take 1 tablet (25 mg total) by mouth every 6 (six) hours as needed for nausea or vomiting. 15 tablet 0   Venlafaxine HCl 150 MG TB24 Take 1 tablet by mouth daily.     vitamin B-12 1000 MCG tablet Take 1 tablet (1,000 mcg total) by mouth daily. 90 tablet 1    Musculoskeletal: Strength & Muscle Tone: within normal limits Gait & Station: normal Patient leans:  N/A  Psychiatric Specialty Exam:  Presentation  General Appearance: Appropriate for Environment  Eye Contact:Good  Speech:Clear and Coherent  Speech Volume:Normal  Handedness:Right   Mood and Affect  Mood:Irritable  Affect:Constricted; Congruent   Thought Process  Thought Processes:Coherent  Descriptions of Associations:Intact  Orientation:Full (Time, Place and Person)  Thought Content:Logical  History of Schizophrenia/Schizoaffective disorder:No data recorded Duration of Psychotic Symptoms:No data recorded Hallucinations:Hallucinations: None  Ideas of Reference:None  Suicidal Thoughts:Suicidal Thoughts: No  Homicidal Thoughts:Homicidal Thoughts: No   Sensorium  Memory:Immediate Good; Recent Good; Remote Good  Judgment:Intact  Insight:Fair   Executive Functions  Concentration:Good  Attention Span:Good  [place] of Knowledge:Good  Language:Good   Psychomotor Activity  Psychomotor Activity:Psychomotor Activity: Normal   Assets  Assets:Communication Skills; Desire for Improvement; Housing; Resilience; Social Support   Sleep  Sleep:Sleep: Poor   Physical Exam: Physical Exam Vitals and nursing note reviewed.  Constitutional:      Appearance: Normal appearance.  HENT:     Head: Normocephalic and atraumatic.     Nose: Nose normal.     Mouth/Throat:     Mouth: Mucous membranes are moist.  Cardiovascular:     Rate and Rhythm: Tachycardia present.  Pulmonary:     Effort: Pulmonary effort is normal.  Musculoskeletal:        General: Normal range of motion.     Cervical back: Normal range of motion and neck supple.  Neurological:     Mental Status: She is alert and oriented to person, place, and time. Mental status is at baseline.  Psychiatric:        Attention and Perception: Attention and perception normal.        Mood and Affect: Affect is blunt and angry.        Speech: Speech normal.        Behavior: Behavior is  withdrawn.        Thought Content: Thought content normal.        Cognition and Memory: Cognition and memory normal.        Judgment: Judgment normal.   Review of Systems  Psychiatric/Behavioral:  Positive for depression and substance abuse. The patient has insomnia.   Blood pressure (!) 136/100, pulse (!) 110, temperature 97.9 F (36.6 C), resp. rate 18, height '5\' 6"'$  (1.676 m), weight 90.7 kg, last menstrual period 05/06/2021, SpO2 100 %. Body mass index is 32.28 kg/m.  Treatment Plan Summary: Plan The patient is not a safety risk to herself or others and does not require psychiatric inpatient admission for stabilization and treatment.  Disposition: No evidence of imminent risk to self or others at present.   Patient does not meet criteria for psychiatric inpatient admission. Supportive therapy provided about ongoing stressors. Discussed crisis plan, support from social network, calling 911, coming to the Emergency Department, and calling Suicide Hotline.  Caroline Sauger, NP 06/06/2021 4:36 AM

## 2021-06-06 NOTE — ED Provider Notes (Addendum)
[facility] Emergency Department Provider Note  ____________________________________________  Time seen: Approximately 3:41 AM  I have reviewed the triage vital signs and the nursing notes.   HISTORY  Chief Complaint Mental Health Problem   HPI Katrina Page is a 40 y.o. female with a history of alcohol abuse, suicide attempt by overdose, depression who was brought in by [facility] for suicidal thoughts.  According to patient she recently got out of jail 2 days ago.  When she returned home, her kids, who live with patient's mother, had taken all of her stuff out of the house including appliances, TV, Xbox.  Patient went over to her mother's house and requested to get some of her stuff back including a charger for her phone.  Her kids said no, so she called the police.  The police told her to leave their house and go back to her house.  Once the police left, her kids came over to her porch and started arguing with her.  The police was called again, and when they arrived, patient's kids told them that patient was suicidal which prompted her visit to the emergency room.  Patient denies any suicidal or homicidal thoughts.  She does have a history of prior suicide attempt.   Past Medical History:  Diagnosis Date   Adrenal nodule ([place]) 06/2019   left   Alcohol abuse    Anemia    Bronchitis    Depression    History of kidney stones    Migraines    Nodule of right lung 06/2019   Pulmonary emboli ([place]) 07/23/2019   bilateral   Thrombocytopenia ([place])     Patient Active Problem List   Diagnosis Date Noted   Nausea and vomiting 04/03/2020   Refractory nausea and vomiting 04/02/2020   SIRS (systemic inflammatory response syndrome) (HCC)    Nausea    Intractable vomiting    Intractable vomiting with nausea 03/24/2020   AKI (acute kidney injury) ([place]) 03/24/2020   Anemia 03/24/2020   Thrombocytopenia ([place]) 02/10/2020   Intractable nausea and vomiting  02/09/2020   Hypokalemia due to excessive gastrointestinal loss of potassium    Hypomagnesemia    Left adrenal mass ([place])    Obstructive uropathy    Severe recurrent major depression without psychotic features ([place]) 12/29/2019   Suicide attempt by drug overdose ([place]) 12/29/2019   Chronic back pain 12/29/2019   Nicotine abuse 12/29/2019   MDD (major depressive disorder) 12/28/2019   MDD (major depressive disorder), severe ([place]) 12/24/2019   Alcohol abuse 12/24/2019   Left renal stone 08/25/2019   Chest pain 07/24/2019   Dyspnea 07/24/2019   Tobacco use 07/24/2019   Pulmonary embolism ([place]) 07/23/2019   Sepsis ([place]) 11/29/2018    Past Surgical History:  Procedure Laterality Date   APPENDECTOMY     CHOLECYSTECTOMY     CYSTOSCOPY W/ RETROGRADES Left 03/05/2020   Procedure: CYSTOSCOPY WITH RETROGRADE PYELOGRAM;  Surgeon: Hollice Espy, MD;  Location: [facility] ORS;  Service: Urology;  Laterality: Left;   CYSTOSCOPY WITH STENT PLACEMENT Left 02/09/2020   Procedure: CYSTOSCOPY WITH STENT PLACEMENT;  Surgeon: Hollice Espy, MD;  Location: [facility] ORS;  Service: Urology;  Laterality: Left;   CYSTOSCOPY/URETEROSCOPY/HOLMIUM LASER/STENT PLACEMENT Left 03/05/2020   Procedure: CYSTOSCOPY/URETEROSCOPY/HOLMIUM LASER/STENT EXCHANGE;  Surgeon: Hollice Espy, MD;  Location: [facility] ORS;  Service: Urology;  Laterality: Left;   ESOPHAGOGASTRODUODENOSCOPY (EGD) WITH PROPOFOL N/A 03/27/2020   Procedure: ESOPHAGOGASTRODUODENOSCOPY (EGD) WITH PROPOFOL;  Surgeon: Lucilla Lame, MD;  Location: [facility] ENDOSCOPY;  Service: Endoscopy;  Laterality: N/A;   TUBAL LIGATION      Prior to Admission medications   Medication Sig Start Date End Date Taking? Authorizing Provider  albuterol (VENTOLIN HFA) 108 (90 Base) MCG/ACT inhaler Inhale 1-2 puffs into the lungs every 4 (four) hours as needed for wheezing or shortness of breath. 01/02/20   Clapacs, Madie Reno, MD  chlordiazePOXIDE (LIBRIUM) 5 MG capsule Take 6 caps (30 mg) 4x daily  on day 1. Take 5 caps (25 mg) 4x daily on day 2. Take 4 caps (20 mg) 4x daily on day 3. Take 3 caps (15 mg) 4x daily on day 4. Take 2 caps (10 mg) 4x daily on day 5. Take 2 caps (10 mg) 3x daily on day 6. Take 1 cap (5 mg) 3x daily on day 7. Take 1 cap (5 mg) twice daily on day 8. Take 1 cap (5 mg) in the evening on day 9. 11/19/20   Hinda Kehr, MD  famotidine (PEPCID) 20 MG tablet Take 1 tablet (20 mg total) by mouth 2 (two) times daily. 11/10/20   Carrie Mew, MD  Folic Acid-Vit Q000111Q 123456 0.4-50-0.1 MG TABS SMARTSIG:1 Tablet(s) By Mouth Daily 03/29/20   [provider]  gabapentin (NEURONTIN) 300 MG capsule Take 1 capsule (300 mg total) by mouth 3 (three) times daily. 11/19/20 11/19/21  Hinda Kehr, MD  guaiFENesin-codeine 100-10 MG/5ML syrup Take 5 mLs by mouth every 6 (six) hours as needed for cough. 08/27/20   Johnn Hai, PA-C  hydrOXYzine (ATARAX/VISTARIL) 50 MG tablet Take 1 tablet (50 mg total) by mouth 3 (three) times daily as needed for anxiety. 03/27/20   Fritzi Mandes, MD  meloxicam (MOBIC) 15 MG tablet Take 1 tablet (15 mg total) by mouth daily. 09/17/20 09/17/21  Lannie Fields, PA-C  ondansetron (ZOFRAN ODT) 4 MG disintegrating tablet Take 1 tablet (4 mg total) by mouth every 8 (eight) hours as needed for nausea or vomiting. 11/10/20   Carrie Mew, MD  oxybutynin (DITROPAN) 5 MG tablet Take 1 tablet (5 mg total) by mouth every 8 (eight) hours as needed for bladder spasms. 02/12/20   Lorella Nimrod, MD  pantoprazole (PROTONIX) 40 MG tablet Take 1 tablet (40 mg total) by mouth daily. 03/27/20   Fritzi Mandes, MD  Potassium Chloride ER 20 MEQ TBCR Take 20 mEq by mouth daily.  03/29/20   [provider]  promethazine (PHENERGAN) 25 MG tablet Take 1 tablet (25 mg total) by mouth every 6 (six) hours as needed for nausea or vomiting. 08/27/20   Johnn Hai, PA-C  Venlafaxine HCl 150 MG TB24 Take 1 tablet by mouth daily. 05/07/20   [provider]  vitamin  B-12 1000 MCG tablet Take 1 tablet (1,000 mcg total) by mouth daily. 02/13/20   Lorella Nimrod, MD    Allergies Patient has no known allergies.  Family History  Problem Relation Age of Onset   Hypertension Mother    Diabetes Mother    Stroke Mother    Breast cancer Mother     Social History Social History   Tobacco Use   Smoking status: Every Day    Packs/day: 0.25    Years: 20.00    Pack years: 5.00    Types: Cigarettes   Smokeless tobacco: Never  Vaping Use   Vaping Use: Never used  Substance Use Topics   Alcohol use: Yes    Alcohol/week: 6.0 standard drinks    Types: 6 Shots of liquor per week  Drug use: No    Review of Systems  Constitutional: Negative for fever. Eyes: Negative for visual changes. ENT: Negative for sore throat. Neck: No neck pain  Cardiovascular: Negative for chest pain. Respiratory: Negative for shortness of breath. Gastrointestinal: Negative for abdominal pain, vomiting or diarrhea. Genitourinary: Negative for dysuria. Musculoskeletal: Negative for back pain. Skin: Negative for rash. Neurological: Negative for headaches, weakness or numbness. Psych: No SI or HI  ____________________________________________   PHYSICAL EXAM:  VITAL SIGNS: ED Triage Vitals  Enc Vitals Group     BP 06/06/21 0149 (!) 136/100     Pulse Rate 06/06/21 0149 (!) 110     Resp 06/06/21 0149 18     Temp 06/06/21 0149 97.9 F (36.6 C)     Temp src --      SpO2 06/06/21 0149 100 %     Weight 06/06/21 0149 200 lb (90.7 kg)     Height 06/06/21 0149 '5\' 6"'$  (1.676 m)     Head Circumference --      Peak Flow --      Pain Score 06/06/21 0148 0     Pain Loc --      Pain Edu? --      Excl. in [place]? --     Constitutional: Alert and oriented. Well appearing and in no apparent distress. HEENT:      Head: Normocephalic and atraumatic.         Eyes: Conjunctivae are normal. Sclera is non-icteric.       Mouth/Throat: Mucous membranes are moist.       Neck: Supple  with no signs of meningismus. Cardiovascular: Regular rate and rhythm.  Respiratory: Normal respiratory effort.  Gastrointestinal: Soft, non tender, and non distended. Musculoskeletal: No edema, cyanosis, or erythema of extremities. Neurologic: Normal speech and language. Face is symmetric. Moving all extremities. No gross focal neurologic deficits are appreciated. Skin: Skin is warm, dry and intact. No rash noted. Psychiatric: Mood and affect are normal. Speech and behavior are normal.  ____________________________________________   LABS (all labs ordered are listed, but only abnormal results are displayed)  Labs Reviewed  CBC - Abnormal; Notable for the following components:      Result Value   Hemoglobin 11.8 (*)    MCV 79.1 (*)    MCH 24.4 (*)    RDW 22.4 (*)    All other components within normal limits  COMPREHENSIVE METABOLIC PANEL - Abnormal; Notable for the following components:   Creatinine, Ser 1.19 (*)    Total Protein 8.2 (*)    GFR, Estimated 59 (*)    All other components within normal limits  ETHANOL - Abnormal; Notable for the following components:   Alcohol, Ethyl (B) 153 (*)    All other components within normal limits  SALICYLATE LEVEL - Abnormal; Notable for the following components:   Salicylate Lvl Q000111Q (*)    All other components within normal limits  ACETAMINOPHEN LEVEL - Abnormal; Notable for the following components:   Acetaminophen (Tylenol), Serum <10 (*)    All other components within normal limits  URINE DRUG SCREEN, QUALITATIVE ([facility] ONLY) - Abnormal; Notable for the following components:   Tricyclic, Ur Screen POSITIVE (*)    Cannabinoid 50 Ng, Ur  POSITIVE (*)    Benzodiazepine, Ur Scrn POSITIVE (*)    All other components within normal limits  POC URINE PREG, ED   ____________________________________________  EKG  none  ____________________________________________  RADIOLOGY  none   ____________________________________________   PROCEDURES  Procedure(s) performed: None Procedures Critical Care performed:  None ____________________________________________   INITIAL IMPRESSION / ASSESSMENT AND PLAN / ED COURSE  40 y.o. female with a history of alcohol abuse, suicide attempt by overdose, depression who was brought in by [facility] for suicidal thoughts.  Patient denies any SI or HI.  She does have a prior history of suicide attempt.  We will consult psychiatry for psychiatric clearance.  Labs for medical clearance show UDS positive for TCA, cannabinoids, and benzos.  Blood work without any acute findings.  Alcohol level of 153.  Patient is clinically sober   The patient has been placed in psychiatric observation due to the need to provide a safe environment for the patient while obtaining psychiatric consultation and evaluation, as well as ongoing medical and medication management to treat the patient's condition.  The patient has not been placed under full IVC at this time.  _________________________ 7:19 AM on 06/06/2021 ----------------------------------------- Neomia Dear by psychiatry for discharge after corroboration with history with family and no concerns for suicidal thoughts.    Please note:  Patient was evaluated in Emergency Department today for the symptoms described in the history of present illness. Patient was evaluated in the context of the global COVID-19 pandemic, which necessitated consideration that the patient might be at risk for infection with the SARS-CoV-2 virus that causes COVID-19. Institutional protocols and algorithms that pertain to the evaluation of patients at risk for COVID-19 are in a state of rapid change based on information released by regulatory bodies including the CDC and federal and state organizations. These policies and algorithms were followed during the patient's care in the ED.  Some ED evaluations and interventions may be  delayed as a result of limited staffing during the pandemic.  ____________________________________________   FINAL CLINICAL IMPRESSION(S) / ED DIAGNOSES   Final diagnoses:  Evaluation by psychiatric service required      NEW MEDICATIONS STARTED DURING THIS VISIT:  ED Discharge Orders     None        Note:  This document was prepared using Dragon voice recognition software and may include unintentional dictation errors.     Alfred Levins, [place], MD 06/06/21 [place], [place], MD 06/06/21 [id number]

## 2021-06-06 NOTE — ED Notes (Addendum)
With this nurse and EDT Beth present, pt removes black tennis shoes, black socks, black shorts, grey t-shirt, tan cap, gold tone necklace, wallet, cell phone, black bra, tan panties--all placed in labeled pt belonging bag to be secured on nursing unit and pt changed into behav scrubs

## 2021-06-06 NOTE — ED Notes (Signed)
Patient resting comfortably in room. No complaints or concerns voiced. No distress or abnormal behavior noted. Will continue to monitor. Q 15 minute rounds continue.

## 2021-06-06 NOTE — ED Triage Notes (Signed)
Patient ambulatory to triage with steady gait, without difficulty or distress noted; pt brought in by [place] [facility] who reports someone told him she was going to harm herself; pt denies any SI or HI

## 2021-06-14 ENCOUNTER — Emergency Department
Admission: EM | Admit: 2021-06-14 | Discharge: 2021-06-14 | Disposition: A | Discharge status: Home / Self Care | Payer: Self-pay | Attending: Emergency Medicine | Admitting: Emergency Medicine

## 2021-06-14 ENCOUNTER — Encounter: Payer: Self-pay | Admitting: Emergency Medicine

## 2021-06-14 ENCOUNTER — Other Ambulatory Visit: Payer: Self-pay

## 2021-06-14 DIAGNOSIS — F32A Depression, unspecified: Secondary | ICD-10-CM

## 2021-06-14 DIAGNOSIS — F17210 Nicotine dependence, cigarettes, uncomplicated: Secondary | ICD-10-CM | POA: Insufficient documentation

## 2021-06-14 DIAGNOSIS — F329 Major depressive disorder, single episode, unspecified: Secondary | ICD-10-CM | POA: Insufficient documentation

## 2021-06-14 NOTE — ED Notes (Signed)
This RN notified by triage RN that the patient is here voluntarily for SI, but is now stating she would like to leave the facility.  Triage RN expressed concern for patient's safety so this RN informed EDP, Dr. Corky Downs to come and consult for possible IVC to be completed.  EDP to talk with patient in the triage room prior to patient leaving facility.

## 2021-06-14 NOTE — ED Notes (Signed)
Pt placed in middle triage area to wait for triage. Pt here voluntary for SI.

## 2021-06-14 NOTE — ED Provider Notes (Signed)
[facility] Emergency Department Provider Note   ____________________________________________    I have reviewed the triage vital signs and the nursing notes.   HISTORY  Chief Complaint No chief complaint on file.     HPI Katrina Page is a 40 y.o. female with history as below who is reticent to describe what brought her in and nurses notified me that she had decided to leave and did not want to wait so I was asked to see her in triage.  I asked her if she had SI or HI but she denies.  No indication of self-harm.  Does not want to discuss anything further, she appears to have decisional capacity  Past Medical History:  Diagnosis Date   Adrenal nodule ([place]) 06/2019   left   Alcohol abuse    Anemia    Bronchitis    Depression    History of kidney stones    Migraines    Nodule of right lung 06/2019   Pulmonary emboli ([place] [address]) 07/23/2019   bilateral   Thrombocytopenia ([place])     Patient Active Problem List   Diagnosis Date Noted   Nausea and vomiting 04/03/2020   Refractory nausea and vomiting 04/02/2020   SIRS (systemic inflammatory response syndrome) (HCC)    Nausea    Intractable vomiting    Intractable vomiting with nausea 03/24/2020   AKI (acute kidney injury) ([place]) 03/24/2020   Anemia 03/24/2020   Thrombocytopenia ([place]) 02/10/2020   Intractable nausea and vomiting 02/09/2020   Hypokalemia due to excessive gastrointestinal loss of potassium    Hypomagnesemia    Left adrenal mass ([place])    Obstructive uropathy    Severe recurrent major depression without psychotic features ([place]) 12/29/2019   Suicide attempt by drug overdose ([place]) 12/29/2019   Chronic back pain 12/29/2019   Nicotine abuse 12/29/2019   MDD (major depressive disorder) 12/28/2019   MDD (major depressive disorder), severe ([place]) 12/24/2019   Alcohol abuse 12/24/2019   Left renal stone 08/25/2019   Chest pain 07/24/2019   Dyspnea 07/24/2019   Tobacco use 07/24/2019    Pulmonary embolism ([place]) 07/23/2019   Sepsis ([place]) 11/29/2018    Past Surgical History:  Procedure Laterality Date   APPENDECTOMY     CHOLECYSTECTOMY     CYSTOSCOPY W/ RETROGRADES Left 03/05/2020   Procedure: CYSTOSCOPY WITH RETROGRADE PYELOGRAM;  Surgeon: Hollice Espy, MD;  Location: [facility] ORS;  Service: Urology;  Laterality: Left;   CYSTOSCOPY WITH STENT PLACEMENT Left 02/09/2020   Procedure: CYSTOSCOPY WITH STENT PLACEMENT;  Surgeon: Hollice Espy, MD;  Location: [facility] OR[facility];  Service: Urology;  Laterality: Left;   CYSTOSCOPY/URETEROSCOPY/HOLMIUM LASER/STENT PLACEMENT Left 03/05/2020   Procedure: CYSTOSCOPY/URETEROSCOPY/HOLMIUM LASER/STENT EXCHANGE;  Surgeon: Hollice Espy, MD;  Location: [facility] ORS;  Service: Urology;  Laterality: Left;   ESOPHAGOGASTRODUODENOSCOPY (EGD) WITH PROPOFOL N/A 03/27/2020   Procedure: ESOPHAGOGASTRODUODENOSCOPY (EGD) WITH PROPOFOL;  Surgeon: Lucilla Lame, MD;  Location: [facility] ENDOSCOPY;  Service: Endoscopy;  Laterality: N/A;   TUBAL LIGATION      Prior to Admission medications   Medication Sig Start Date End Date Taking? Authorizing Provider  albuterol (VENTOLIN HFA) 108 (90 Base) MCG/ACT inhaler Inhale 1-2 puffs into the lungs every 4 (four) hours as needed for wheezing or shortness of breath. 01/02/20   Clapacs, Madie [place], MD  chlordiazePOXIDE (LIBRIUM) 5 MG capsule Take 6 caps (30 mg) 4x daily on day 1. Take 5 caps (25 mg) 4x daily on day 2. Take 4 caps (20 mg) 4x daily on  day 3. Take 3 caps (15 mg) 4x daily on day 4. Take 2 caps (10 mg) 4x daily on day 5. Take 2 caps (10 mg) 3x daily on day 6. Take 1 cap (5 mg) 3x daily on day 7. Take 1 cap (5 mg) twice daily on day 8. Take 1 cap (5 mg) in the evening on day 9. 11/19/20   Hinda Kehr, MD  famotidine (PEPCID) 20 MG tablet Take 1 tablet (20 mg total) by mouth 2 (two) times daily. 11/10/20   Carrie Mew, MD  Folic Acid-Vit [id number] U04 0.4-50-0.1 MG TABS SMARTSIG:1 Tablet(s) By Mouth Daily 03/29/20   [provider]  gabapentin (NEURONTIN) 300 MG capsule Take 1 capsule (300 mg total) by mouth 3 (three) times daily. 11/19/20 11/19/21  Hinda Kehr, MD  guaiFENesin-codeine 100-10 MG/5ML syrup Take 5 mLs by mouth every 6 (six) hours as needed for cough. 08/27/20   Johnn Hai, PA-C  hydrOXYzine (ATARAX/VISTARIL) 50 MG tablet Take 1 tablet (50 mg total) by mouth 3 (three) times daily as needed for anxiety. 03/27/20   Fritzi Mandes, MD  meloxicam (MOBIC) 15 MG tablet Take 1 tablet (15 mg total) by mouth daily. 09/17/20 09/17/21  Lannie Fields, PA-C  ondansetron (ZOFRAN ODT) 4 MG disintegrating tablet Take 1 tablet (4 mg total) by mouth every 8 (eight) hours as needed for nausea or vomiting. 11/10/20   Carrie Mew, MD  oxybutynin (DITROPAN) 5 MG tablet Take 1 tablet (5 mg total) by mouth every 8 (eight) hours as needed for bladder spasms. 02/12/20   Lorella Nimrod, MD  pantoprazole (PROTONIX) 40 MG tablet Take 1 tablet (40 mg total) by mouth daily. 03/27/20   Fritzi Mandes, MD  Potassium Chloride ER 20 MEQ TBCR Take 20 mEq by mouth daily.  03/29/20   [provider]  promethazine (PHENERGAN) 25 MG tablet Take 1 tablet (25 mg total) by mouth every 6 (six) hours as needed for nausea or vomiting. 08/27/20   Johnn Hai, PA-C  Venlafaxine HCl 150 MG TB24 Take 1 tablet by mouth daily. 05/07/20   [provider]  vitamin B-12 1000 MCG tablet Take 1 tablet (1,000 mcg total) by mouth daily. 02/13/20   Lorella Nimrod, MD     Allergies Patient has no known allergies.  Family History  Problem Relation Age of Onset   Hypertension Mother    Diabetes Mother    Stroke Mother    Breast cancer Mother     Social History Social History   Tobacco Use   Smoking status: Every Day    Packs/day: 0.25    Years: 20.00    Pack years: 5.00    Types: Cigarettes   Smokeless tobacco: Never  Vaping Use   Vaping Use: Never used  Substance Use Topics   Alcohol use: Yes    Alcohol/week: 6.0  standard drinks    Types: 6 Shots of liquor per week   Drug use: No    Unable to obtain eview of Systems     ____________________________________________   PHYSICAL EXAM:  VITAL SIGNS: ED Triage Vitals  Enc Vitals Group     BP 06/14/21 1210 (!) 126/54     Pulse Rate 06/14/21 1210 (!) 102     Resp 06/14/21 1210 16     Temp 06/14/21 1210 97.9 F (36.6 C)     Temp Source 06/14/21 1210 Oral     SpO2 06/14/21 1210 98 %     Weight 06/14/21 1212 136.1  kg (300 lb)     Height 06/14/21 1212 1.753 m (5\' 9" )     Head Circumference --      Peak Flow --      Pain Score 06/14/21 1211 0     Pain Loc --      Pain Edu? --      Excl. in [place]? --     Constitutional: Alert and oriented.  No acute distress Eyes: Conjunctivae are normal.  Head: Atraumatic. Nose: No congestion/rhinnorhea. Mouth/Throat: Mucous membranes are moist.    Respiratory: Normal respiratory effort.  No retractions.   Neurologic:  Normal speech and language. No gross focal neurologic deficits are appreciated.   Skin:  Skin is warm, dry and intact. No rash noted.   ____________________________________________   LABS (all labs ordered are listed, but only abnormal results are displayed)  Labs Reviewed  COMPREHENSIVE METABOLIC PANEL  ETHANOL  SALICYLATE LEVEL  ACETAMINOPHEN LEVEL  CBC  URINE DRUG SCREEN, QUALITATIVE ([place])  POC URINE PREG, ED   ____________________________________________  EKG   ____________________________________________  RADIOLOGY   ____________________________________________   PROCEDURES  Procedure(s) performed: No  Procedures   Critical Care performed: No ____________________________________________   INITIAL IMPRESSION / ASSESSMENT AND PLAN / ED COURSE  Pertinent labs & imaging results that were available during my care of the patient were reviewed by me and considered in my medical decision making (see chart for details).   Patient unwilling to speak  with [place] and describe why she is here.  Concerned that she may be here for behavioral issues however she is refusing to stay.  Denies SI or HI.  No basis for involuntary commitment, asked her to please return if she changed her mind   ____________________________________________   FINAL CLINICAL IMPRESSION(S) / ED DIAGNOSES  Final diagnoses:  Depression, unspecified depression type      NEW MEDICATIONS STARTED DURING THIS VISIT:  New Prescriptions   No medications on file     Note:  This document was prepared using Dragon voice recognition software and may include unintentional dictation errors.    Lavonia Drafts, MD 06/14/21 [id number]

## 2021-06-14 NOTE — ED Triage Notes (Signed)
Pt here with c/o "wanting help or i'm ending it all, the bridge is looking good right now." Tearful in triage, states she has wanted to kill herself before but "failed at that too." Admitted to smoking in triage, has been told not to do that again, pt agreed. Denies hi.

## 2021-06-14 NOTE — ED Notes (Signed)
Prior to this RN finishing pt triage, pt states she is "better now" and "wants to leave, states "I don't want to kill myself anymore." This RN notified first nurse, Delilah Shan RN who notified Dr Corky Downs who came to triage to talk to pt. Upon deeper discussion with pt and Dr Corky Downs, ED MD, pt states she is wanting to leave, "I lied about wanting to kill myself by jumping off a bridge, states she is choosing to leave facility, understands she is voluntary." Dr Corky Downs informed pt we want to help her and if she changes her mind and comes back, we will gladly see her. Pt verbalized understanding.

## 2021-06-25 DIAGNOSIS — G629 Polyneuropathy, unspecified: Secondary | ICD-10-CM | POA: Insufficient documentation

## 2021-06-28 IMAGING — CR DG CHEST 2V
2 series · 2 of 2 positions shown · non-contrast
Comparison: July 23, 2019

CLINICAL DATA: Chest pain

EXAM:
CHEST - 2 VIEW

[chest pa]
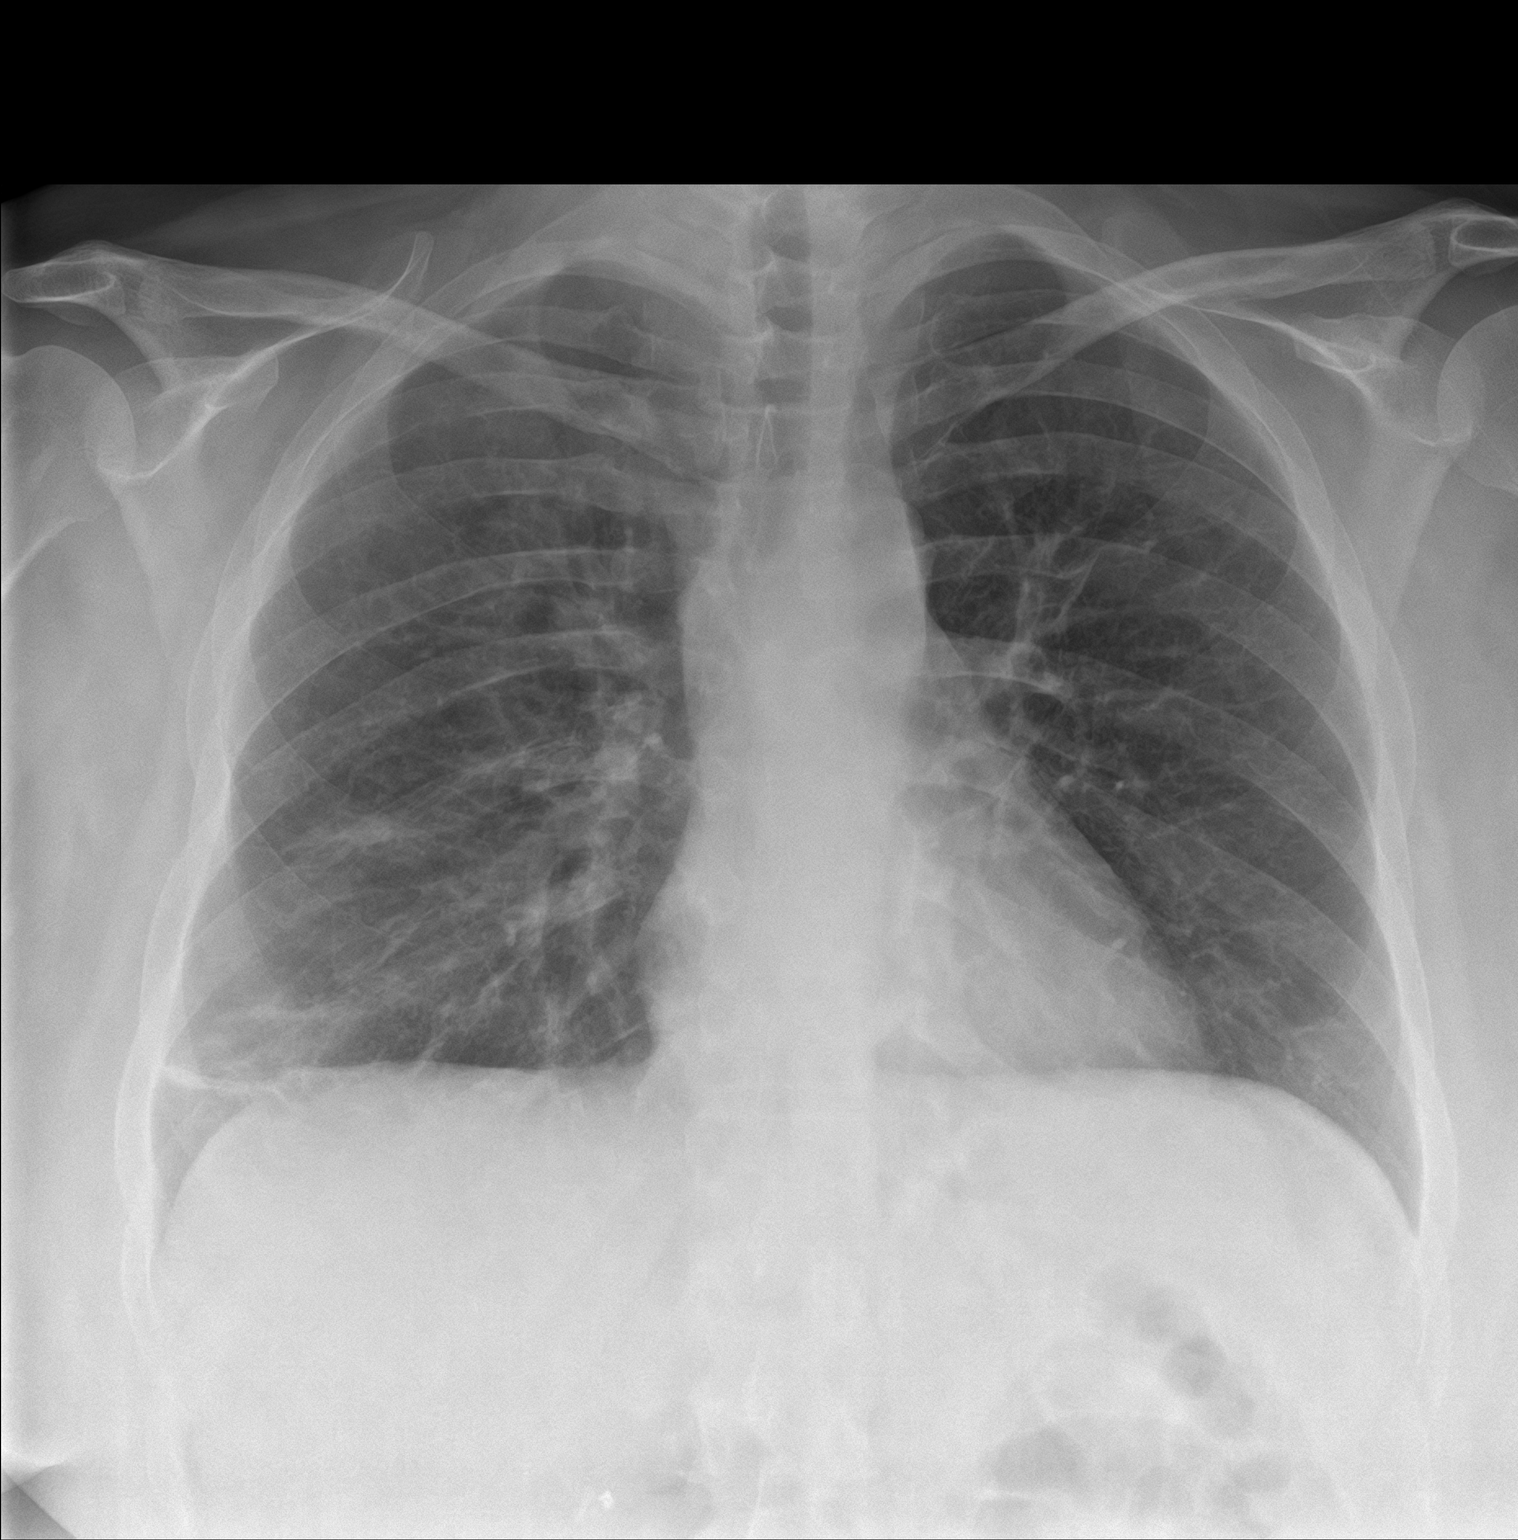

[chest lat]
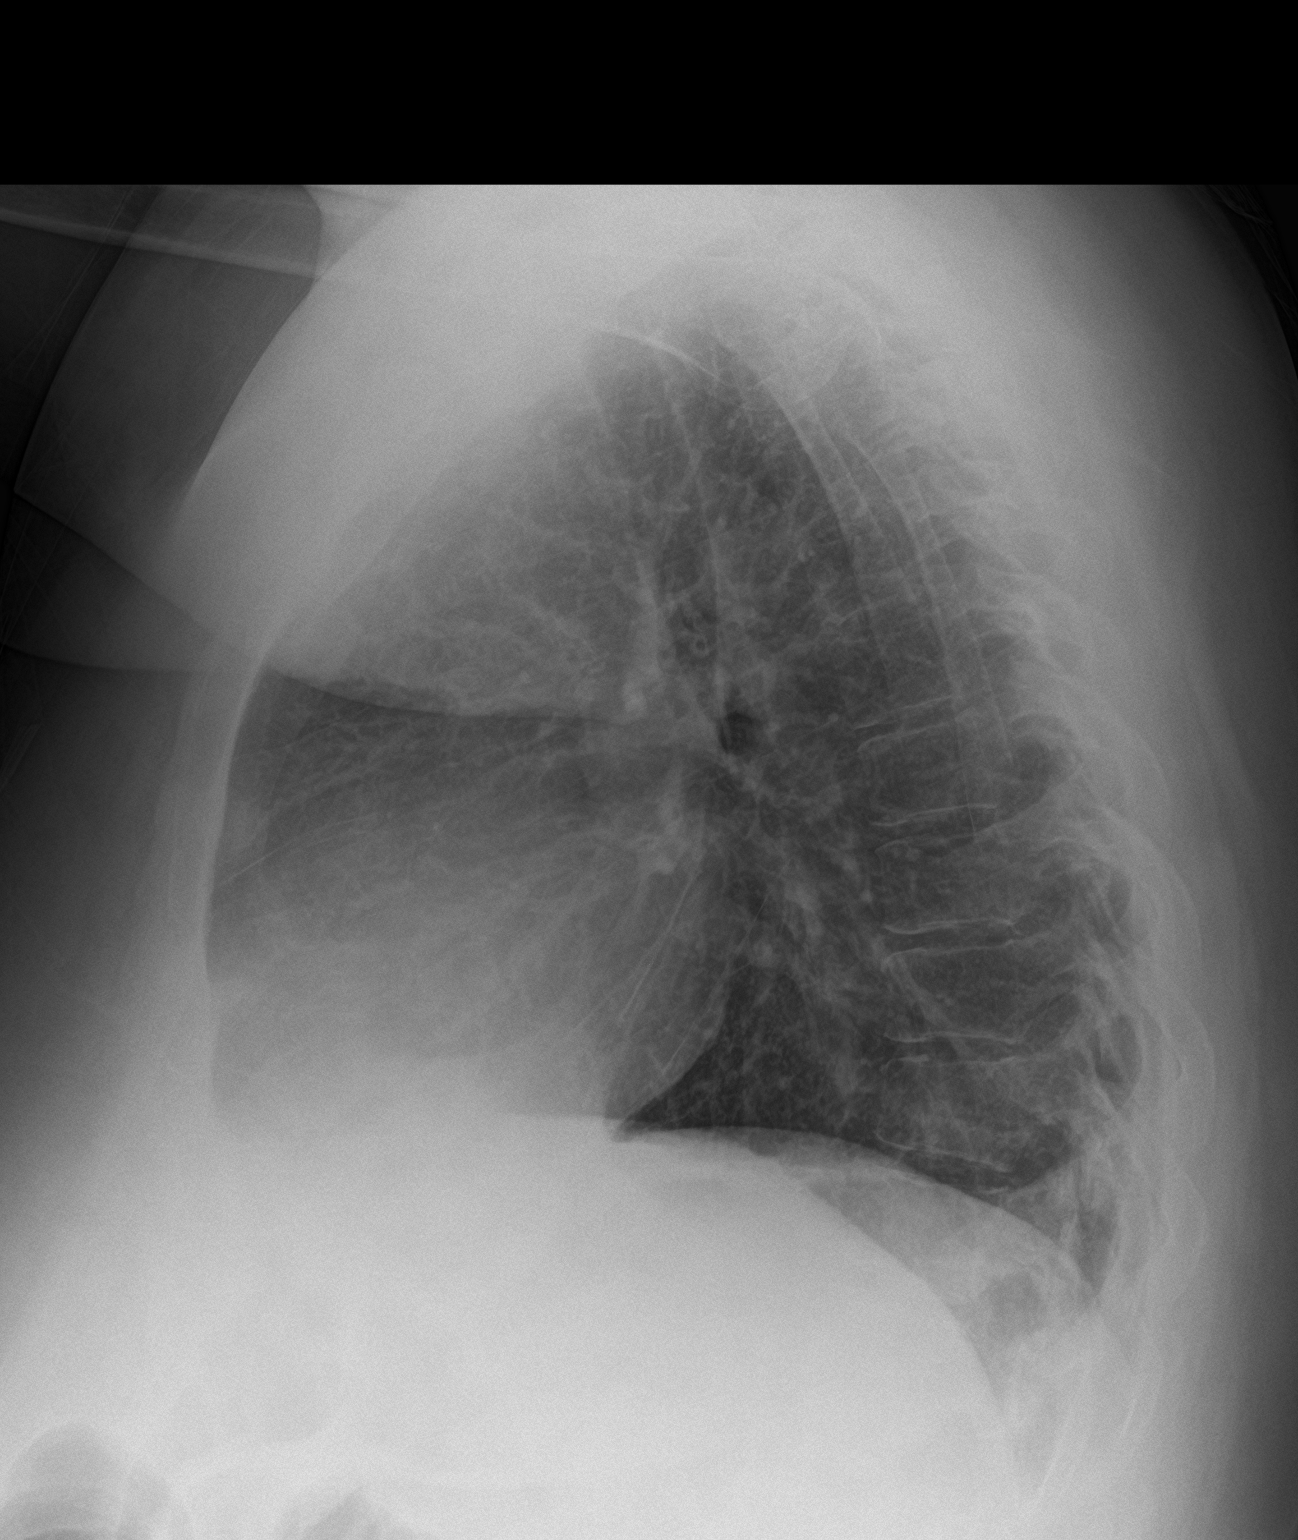

[2 of 2 positions shown; findings below may reference images not displayed]

FINDINGS: There is atelectatic change in the right mid lung and right base
regions. Lungs elsewhere are clear. Heart size and pulmonary
vascularity are normal. No adenopathy. No bone lesions.
IMPRESSION: Right mid lung and right base atelectatic change. Lungs elsewhere
clear. Cardiac silhouette normal.

## 2021-07-26 DIAGNOSIS — G43001 Migraine without aura, not intractable, with status migrainosus: Secondary | ICD-10-CM | POA: Insufficient documentation

## 2021-07-29 DIAGNOSIS — F603 Borderline personality disorder: Secondary | ICD-10-CM | POA: Insufficient documentation

## 2021-07-29 DIAGNOSIS — F3181 Bipolar II disorder: Secondary | ICD-10-CM | POA: Insufficient documentation

## 2021-08-14 IMAGING — CT CT ABD-PELV W/ CM
2 of 5 series · 15 of 46 positions shown, 17 images · IV contrast (APPLIED)
Comparison: 11/27/2007, PET CT 08/23/2019, radiograph 12/10/2019

CLINICAL DATA: Nausea and vomiting

EXAM:
CT ABDOMEN AND PELVIS WITH CONTRAST
TECHNIQUE: Multidetector CT imaging of the abdomen and pelvis was performed
using the standard protocol following bolus administration of
intravenous contrast.
CONTRAST:  100mL OMNIPAQUE IOHEXOL 300 MG/ML  SOLN

[Series 2: routine abd/pel with · axial · 0.87mm/px · z∈[-949,-524]mm · 12 of 97 slices shown, 14 images]
[im 6/97  soft-tissue]
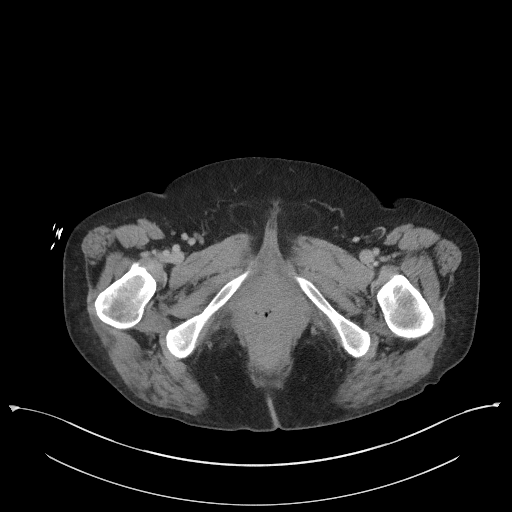
[im 6/97  bone]
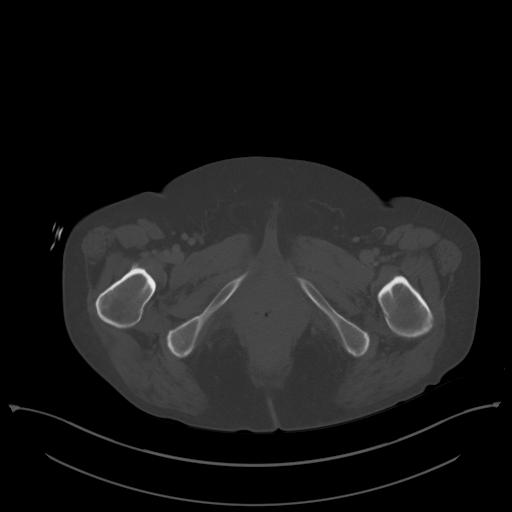
[im 16/97  soft-tissue]
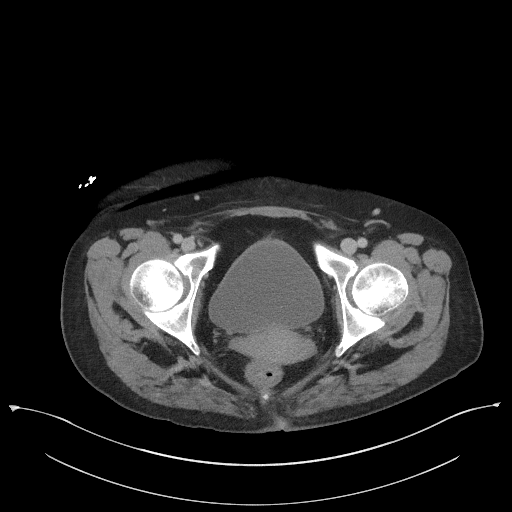
[im 21/97  soft-tissue]
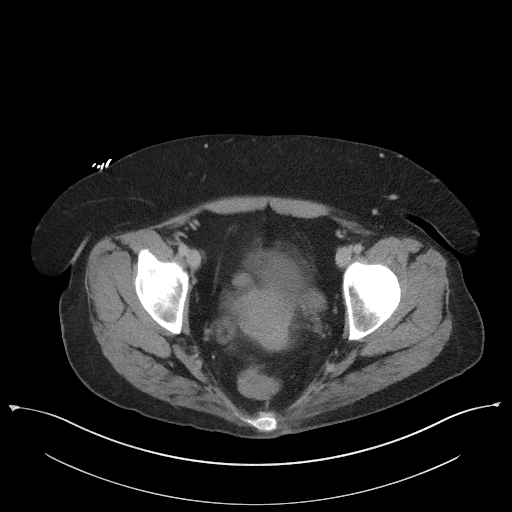
[im 31/97  soft-tissue]
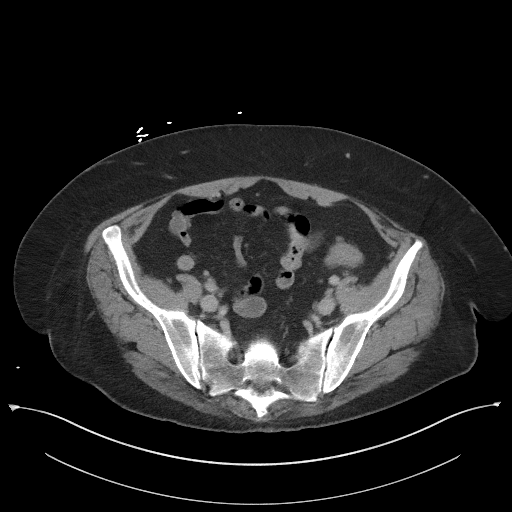
[im 36/97  soft-tissue]
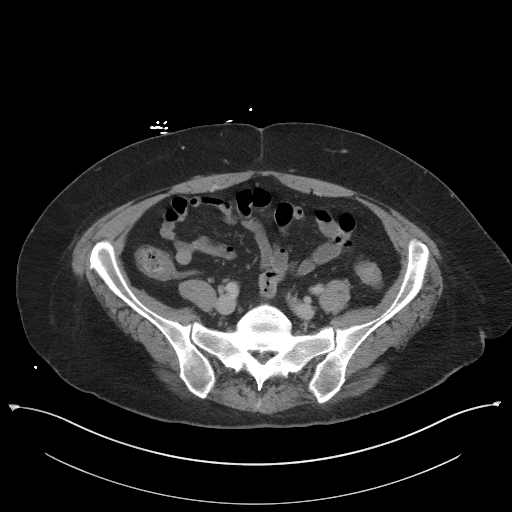
[im 46/97  soft-tissue]
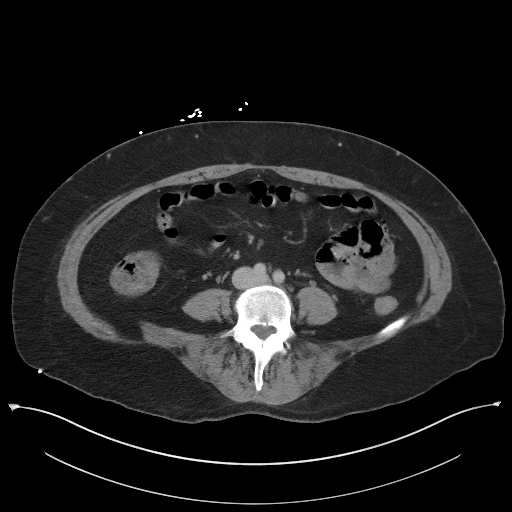
[im 51/97  soft-tissue]
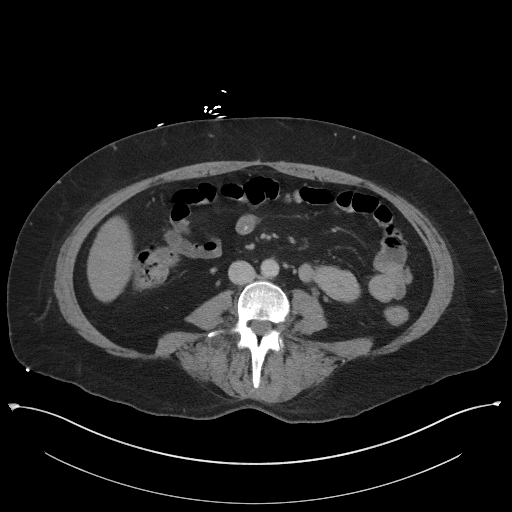
[im 61/97  soft-tissue]
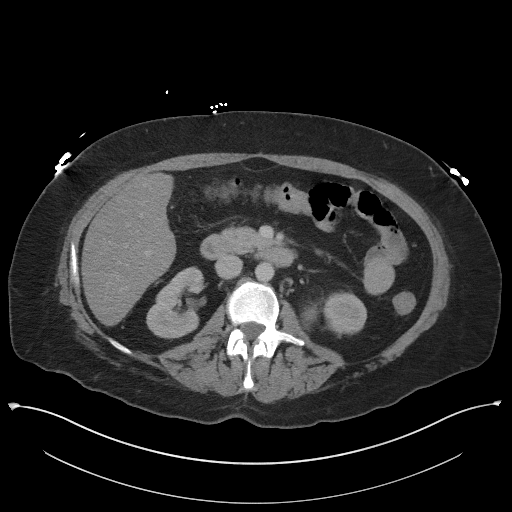
[im 66/97  soft-tissue]
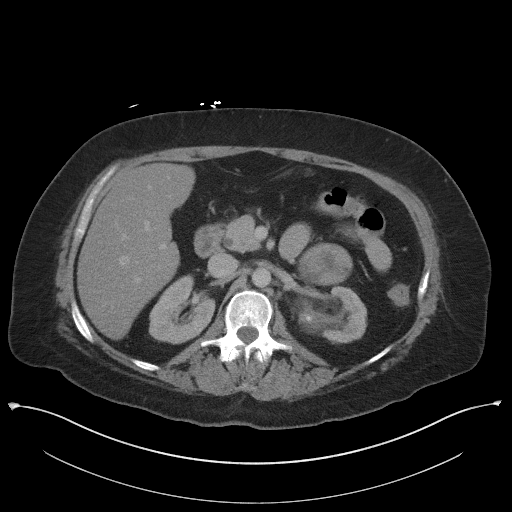
[im 66/97  bone]
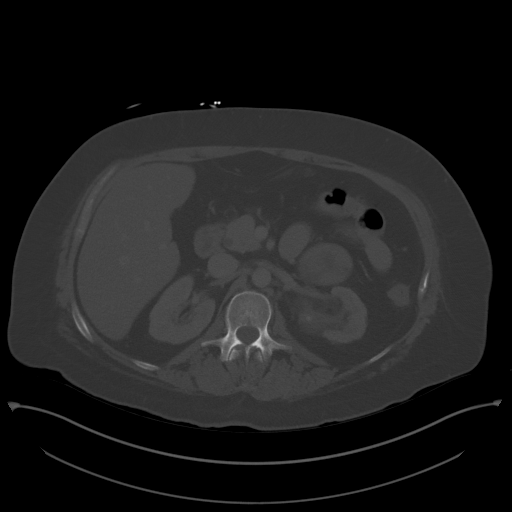
[im 76/97  soft-tissue]
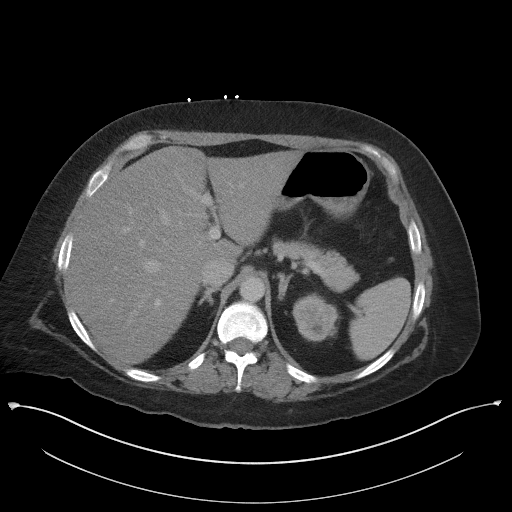
[im 81/97  soft-tissue]
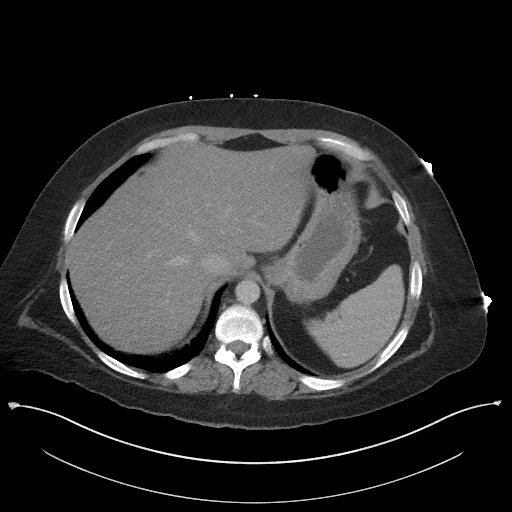
[im 91/97  soft-tissue]
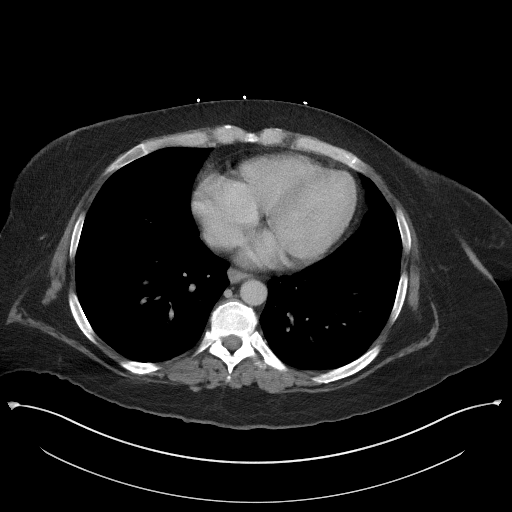

[Series 5: coronal st · coronal · 0.78mm/px · 3 of 83 slices shown]
[im 28/83  soft-tissue]
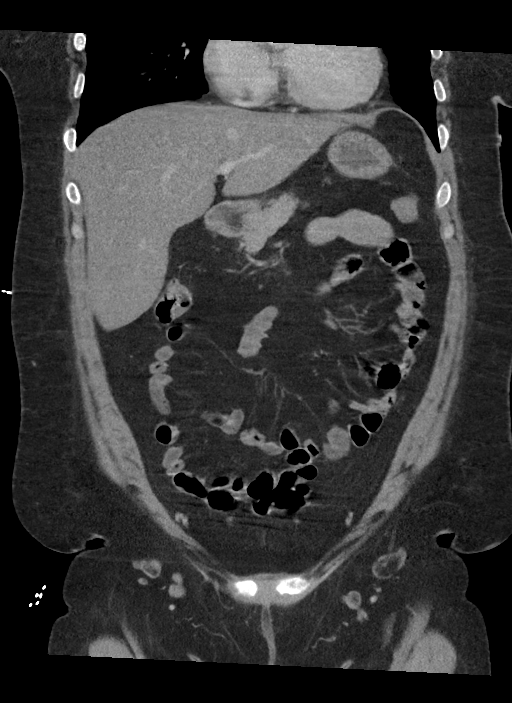
[im 37/83  soft-tissue]
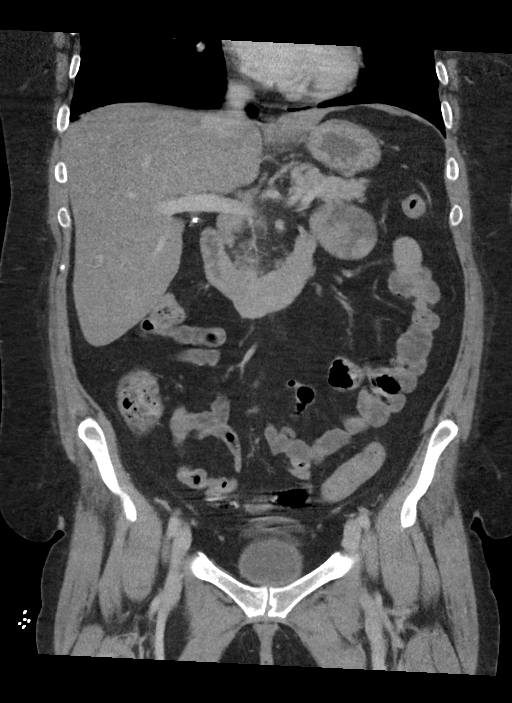
[im 46/83  soft-tissue]
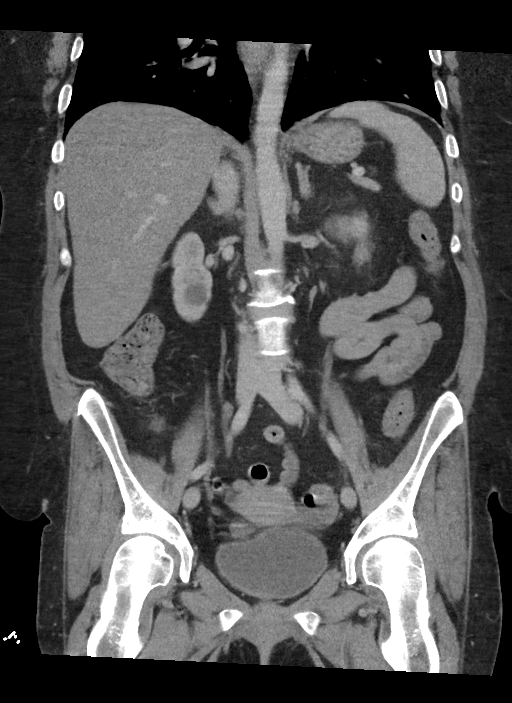

[15 of 46 positions shown; findings below may reference images not displayed]

FINDINGS: Lower chest: Lung bases demonstrate hazy dependent atelectasis.

Hepatobiliary: Hepatic steatosis. Status post cholecystectomy. No
biliary dilatation

Pancreas: Unremarkable. No pancreatic ductal dilatation or
surrounding inflammatory changes.

Spleen: Normal in size without focal abnormality.

Adrenals/Urinary Tract: Stable 1.4 cm right adrenal gland nodule
suggestive of adenoma. Increased size of left adrenal mass measuring
6 x 3.9 cm, previously 5.6 x 3.8 cm. Multiple intrarenal stones
bilaterally. 13 mm left renal collecting stone with obstructive
changes. Soft tissue stranding about the left renal pelvis. Mild
urothelial enhancement of left ureter. The bladder is unremarkable.
Small cyst in the lower pole of the right kidney.

Stomach/Bowel: Stomach is nonenlarged. No dilated small bowel. No
bowel wall thickening. Negative appendix. Sigmoid colon diverticula
without acute inflammatory change

Vascular/Lymphatic: No significant vascular findings are present. No
enlarged abdominal or pelvic lymph nodes.

Reproductive: Uterus and bilateral adnexa are unremarkable.

Other: Negative for free air or free fluid.

Musculoskeletal: No acute or significant osseous findings.
IMPRESSION: 1. Negative for bowel obstruction bowel wall thickening.
2. 13 mm left renal collecting system stone with obstructive
changes. There is inflammatory change about the left renal pelvis
and mild urothelial enhancement of left ureter. There are multiple
additional stones within the bilateral kidneys.
3. Stable 14 mm right adrenal nodule likely adenoma. Suspected
interval enlargement of left adrenal mass, now measuring 6 cm. Given
growth, consider surgical consultation.

## 2022-03-23 IMAGING — CR DG CHEST 2V
1 series · 2 of 2 positions shown · non-contrast
Comparison: April 30, 2020

CLINICAL DATA: Pain following recent fall

EXAM:
CHEST - 2 VIEW

[Series 1: dg chest 2 view · 0.14mm/px · 2 of 2 slices shown]
[im 1/2]
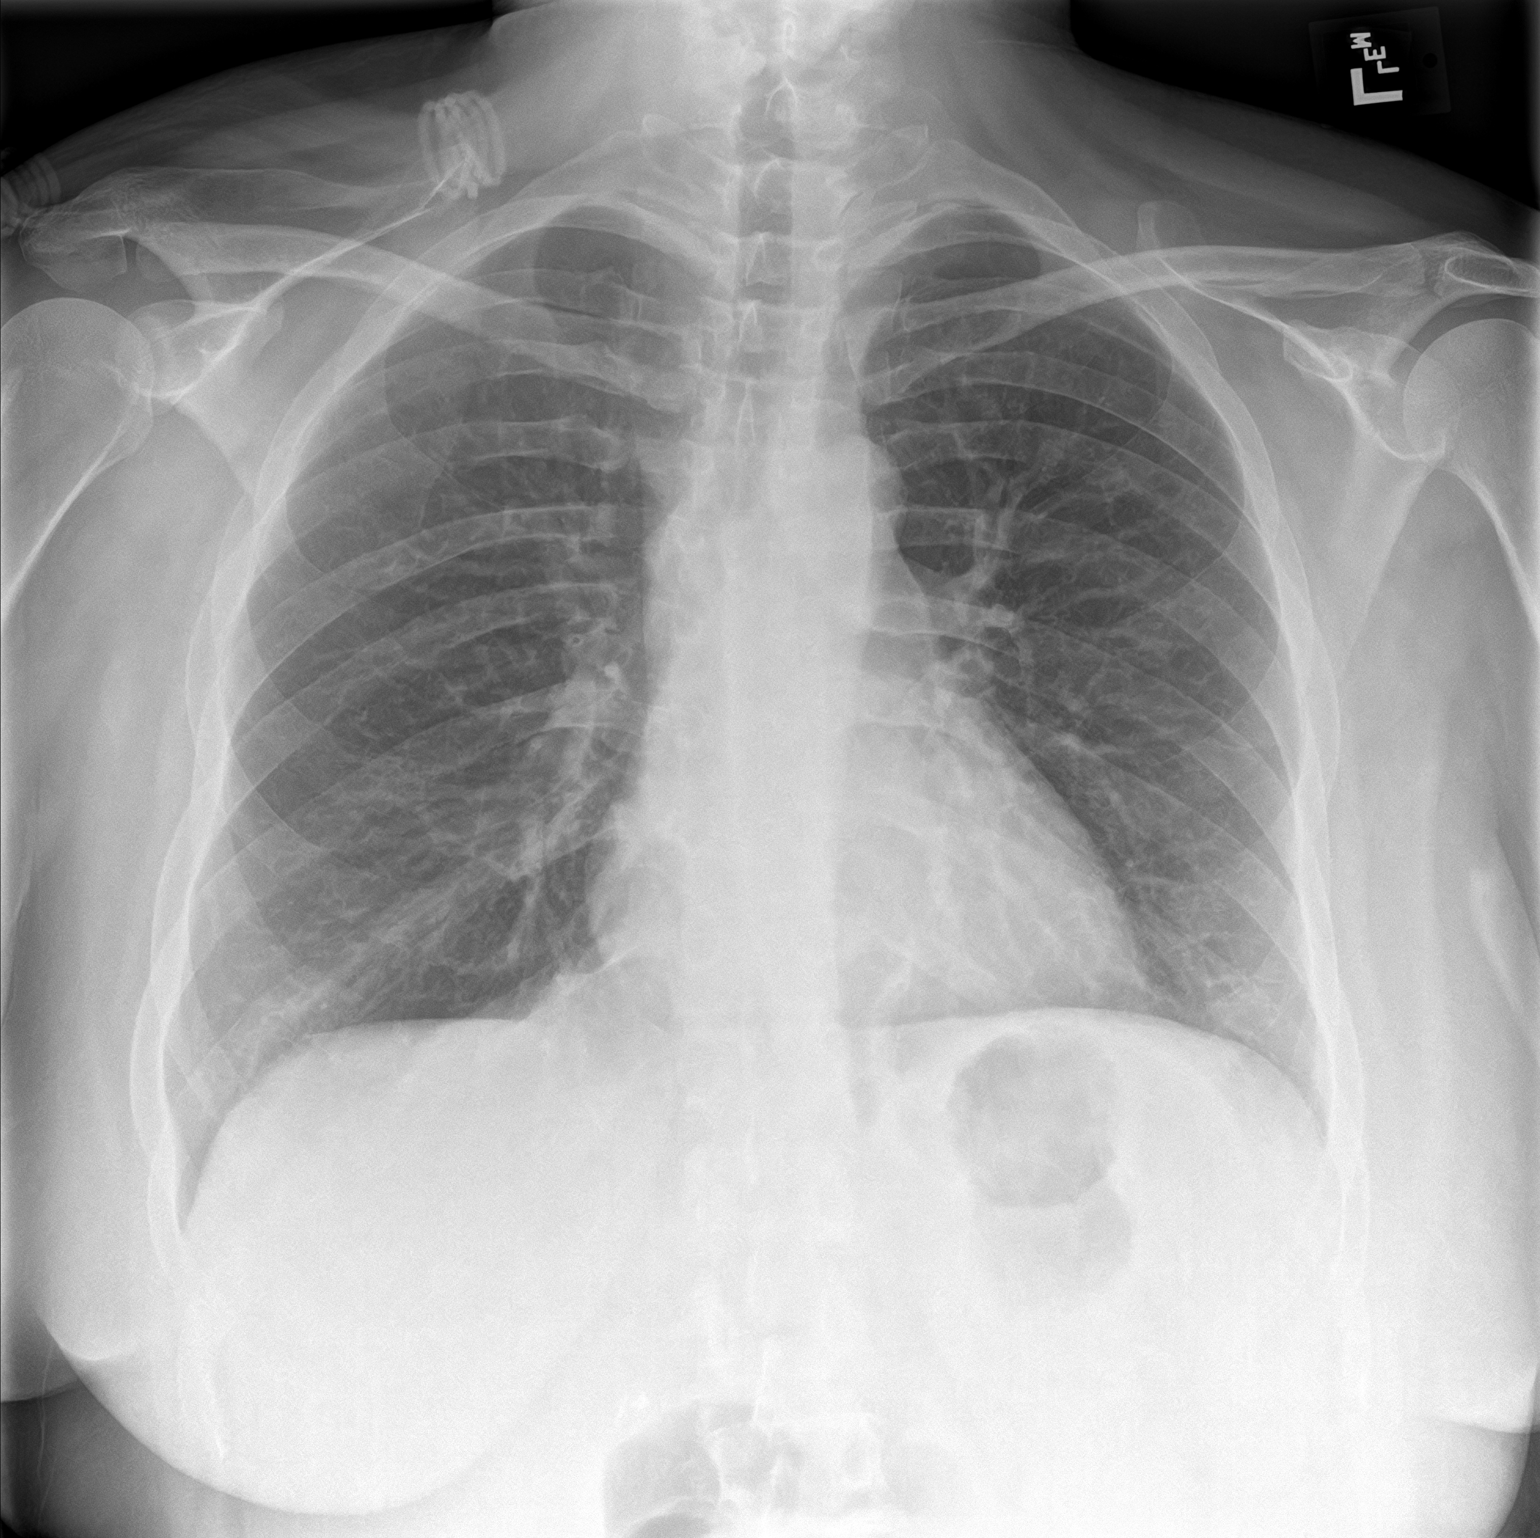
[im 2/2]
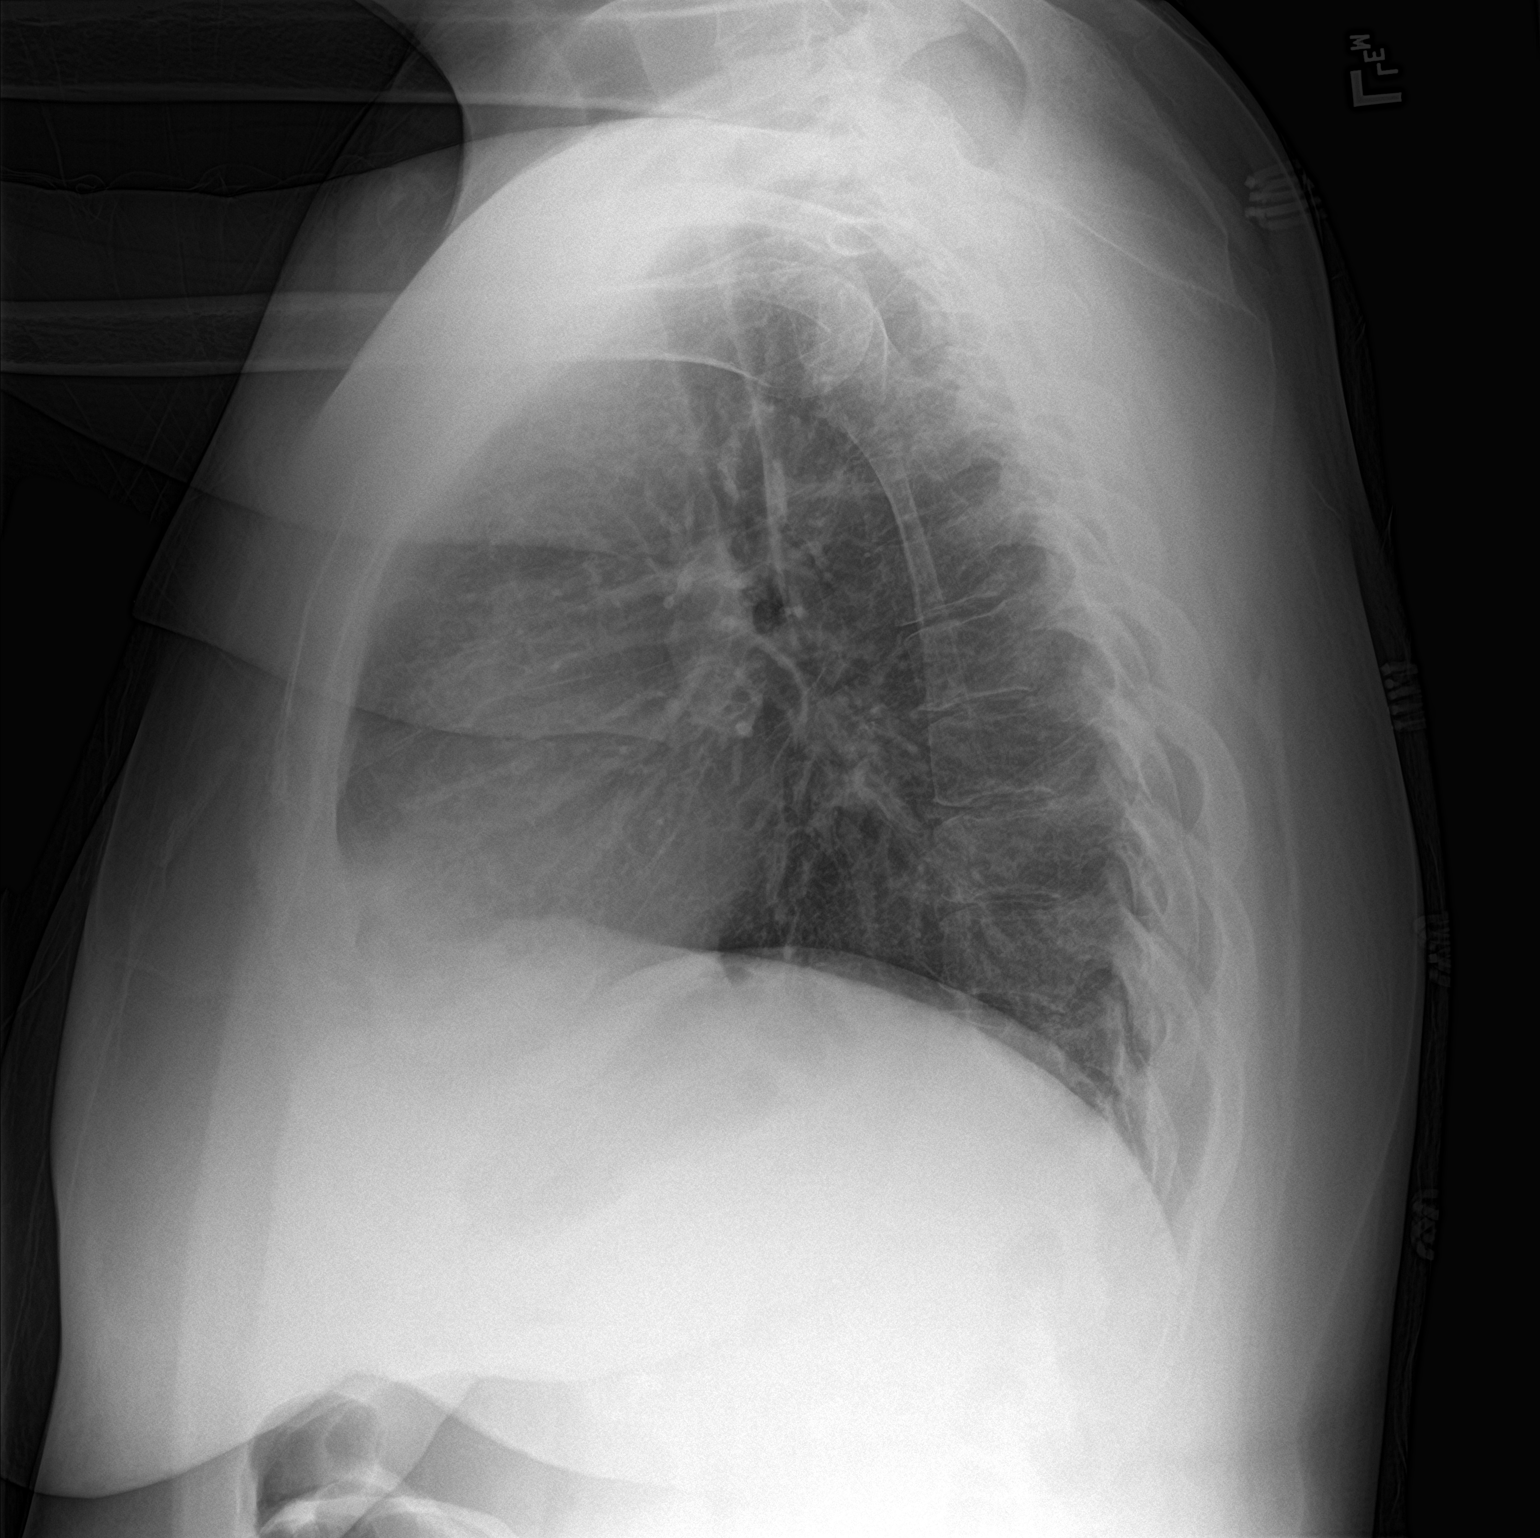

[2 of 2 positions shown; findings below may reference images not displayed]

FINDINGS: Lungs are clear. Heart size and pulmonary vascularity are normal. No
adenopathy. No pneumothorax. No bone lesions.
IMPRESSION: Lungs clear.  Cardiac silhouette normal.

## 2023-04-06 ENCOUNTER — Other Ambulatory Visit: Payer: Self-pay

## 2023-04-06 ENCOUNTER — Encounter: Payer: Self-pay | Admitting: Emergency Medicine

## 2023-04-06 ENCOUNTER — Emergency Department
Admission: EM | Admit: 2023-04-06 | Discharge: 2023-04-06 | Disposition: A | Discharge status: Home / Self Care | Payer: MEDICAID | Attending: Emergency Medicine | Admitting: Emergency Medicine

## 2023-04-06 DIAGNOSIS — M5441 Lumbago with sciatica, right side: Secondary | ICD-10-CM | POA: Insufficient documentation

## 2023-04-06 DIAGNOSIS — M5430 Sciatica, unspecified side: Secondary | ICD-10-CM

## 2023-04-06 DIAGNOSIS — M549 Dorsalgia, unspecified: Secondary | ICD-10-CM | POA: Diagnosis present

## 2023-04-06 MED ORDER — PREDNISONE 10 MG PO TABS: 10.0000 mg | ORAL_TABLET | Freq: Every day | ORAL | 0 refills | Status: DC

## 2023-04-06 MED ORDER — CYCLOBENZAPRINE HCL 10 MG PO TABS
5.0000 mg | ORAL_TABLET | Freq: Once | ORAL | Status: AC
  Administered 2023-04-06: 5 mg via ORAL
  Filled 2023-04-06: qty 1

## 2023-04-06 MED ORDER — LIDOCAINE 5 % EX PTCH
1.0000 | MEDICATED_PATCH | CUTANEOUS | Status: DC
  Administered 2023-04-06: 1 via TRANSDERMAL
  Filled 2023-04-06: qty 1

## 2023-04-06 MED ORDER — DEXAMETHASONE SODIUM PHOSPHATE 10 MG/ML IJ SOLN
10.0000 mg | Freq: Once | INTRAMUSCULAR | Status: AC
  Administered 2023-04-06: 10 mg via INTRAMUSCULAR
  Filled 2023-04-06: qty 1

## 2023-04-06 MED ORDER — CYCLOBENZAPRINE HCL 5 MG PO TABS: 5.0000 mg | ORAL_TABLET | Freq: Every day | ORAL | 0 refills | Status: DC | PRN

## 2023-04-06 MED ORDER — OXYCODONE-ACETAMINOPHEN 5-325 MG PO TABS
1.0000 | ORAL_TABLET | ORAL | Status: AC | PRN
  Administered 2023-04-06 (×2): 1 via ORAL
  Filled 2023-04-06 (×2): qty 1

## 2023-04-06 NOTE — ED Provider Notes (Signed)
[facility] Emergency Department Provider Note     Event Date/Time   First MD Initiated Contact with Patient 04/06/23 2019     (approximate)   History   Back Pain   HPI  Katrina Page is a 42 y.o. female presents to the ED for right back pain x 3 days with radiation down right leg.  progressively worsening.  History of sciatica and reports symptoms are the same.  Denies trauma or injury.  Patient reports pain is better when she leans to the opposite side.  Walking makes pain worse.  Denies urinary symptoms or loss of bowel control.  No fever.     Physical Exam   Triage Vital Signs: ED Triage Vitals  Encounter Vitals Group     BP 04/06/23 1950 123/77     Systolic BP Percentile --      Diastolic BP Percentile --      Pulse Rate 04/06/23 1950 77     Resp 04/06/23 1950 (!) 21     Temp 04/06/23 1950 98.4 F (36.9 C)     Temp Source 04/06/23 1950 Oral     SpO2 04/06/23 1950 100 %     Weight 04/06/23 1951 300 lb (136.1 kg)     Height 04/06/23 1951 5\' 9"  (1.753 m)     Head Circumference --      Peak Flow --      Pain Score 04/06/23 1950 10     Pain Loc --      Pain Education --      Exclude from Growth Chart --     Most recent vital signs: Vitals:   04/06/23 1950 04/06/23 2143  BP: 123/77 124/75  Pulse: 77 78  Resp: (!) 21 18  Temp: 98.4 F (36.9 C)   SpO2: 100% 99%   General: Alert and oriented. INAD. Discomfort. Neck:   No cervical spine tenderness to palpation. AROM without difficulty  CV:  Good peripheral perfusion. RRR.  RESP:  Normal effort. LCTAB.  ABD:  No distention. Soft, Non tender.    BACK:  Paraspinal muscle tenderness of the right lumbar region. (+) right SLRT at 45 degrees. No CVA tenderness bilaterally. MSK:   Full ROM in all joints. No swelling, deformity or tenderness.  NEURO: Cranial nerves II-XII intact. No focal deficits. Sensation and motor function intact.  Stance and gait is steady.  ED Results / Procedures /  Treatments   Labs (all labs ordered are listed, but only abnormal results are displayed) Labs Reviewed - No data to display  History and physical examination do not warrant a lab work up or imaging at this time.   No results found.  PROCEDURES:  Critical Care performed: No  Procedures  MEDICATIONS ORDERED IN ED: Medications  lidocaine (LIDODERM) 5 % 1 patch (1 patch Transdermal Patch Applied 04/06/23 2103)  oxyCODONE-acetaminophen (PERCOCET/ROXICET) 5-325 MG per tablet 1 tablet (1 tablet Oral Given 04/06/23 2104)  dexamethasone (DECADRON) injection 10 mg (10 mg Intramuscular Given 04/06/23 2103)  cyclobenzaprine (FLEXERIL) tablet 5 mg (5 mg Oral Given 04/06/23 2103)    IMPRESSION / MDM / ASSESSMENT AND PLAN / ED COURSE  I reviewed the triage vital signs and the nursing notes.                               42 y.o. female presents to the emergency department for evaluation and treatment of back  pain. See HPI for further details. Vital signs are unremarkable and physical exam are pertinent for paraspinal muscle tenderness of the right lumbar region and positive straight leg raise test on the right leg.   Differential diagnosis includes, but is not limited to sciatica, muscle strain, spinal injury.  Physical exam findings are clinically consistent with sciatica of the right side.  Patient is given percocet in triage with with no relief.  Patient is then given Decadron, Flexeril and a lidocaine patch.  On reevaluation patient's pain has proved.  Patient will be discharged home with prescriptions for flexeril and prednisone.  She is to follow-up with orthopedics in 1 week if symptoms do not improve.  She is in stable condition.  Patient verbalizes understanding. All questions and concerns were addressed during ED visit.    Patient's presentation is most consistent with acute complicated illness / injury requiring diagnostic workup.  FINAL CLINICAL IMPRESSION(S) / ED DIAGNOSES   Final  diagnoses:  Sciatica, unspecified laterality   Rx / DC Orders   ED Discharge Orders          Ordered    cyclobenzaprine (FLEXERIL) 5 MG tablet  Daily PRN        04/06/23 2132    predniSONE (DELTASONE) 10 MG tablet  Daily        04/06/23 2132             Note:  This document was prepared using Dragon voice recognition software and may include unintentional dictation errors.    Romeo Apple, Tamkia Temples A, PA-C 04/06/23 2207    Jene Every, MD 04/07/23 [phone]

## 2023-04-06 NOTE — ED Triage Notes (Signed)
Pt c/o right back pain that radiates in the the right leg x 3 days. Denies any injury/trauma. Sts having dx sciatic in the past. Sts the pain today is worse that the pain the past. Denies urinary s/s.

## 2023-04-13 ENCOUNTER — Ambulatory Visit: Payer: MEDICAID | Admitting: Physician Assistant

## 2023-04-13 ENCOUNTER — Ambulatory Visit: Payer: Self-pay | Admitting: Physician Assistant

## 2023-05-03 ENCOUNTER — Encounter: Payer: Self-pay | Admitting: Emergency Medicine

## 2023-05-03 ENCOUNTER — Emergency Department: Payer: MEDICAID

## 2023-05-03 ENCOUNTER — Emergency Department
Admission: EM | Admit: 2023-05-03 | Discharge: 2023-05-03 | Disposition: A | Discharge status: Home / Self Care | Payer: MEDICAID | Attending: Emergency Medicine | Admitting: Emergency Medicine

## 2023-05-03 ENCOUNTER — Other Ambulatory Visit: Payer: Self-pay

## 2023-05-03 DIAGNOSIS — M25561 Pain in right knee: Secondary | ICD-10-CM

## 2023-05-03 DIAGNOSIS — M5441 Lumbago with sciatica, right side: Secondary | ICD-10-CM | POA: Insufficient documentation

## 2023-05-03 DIAGNOSIS — M5431 Sciatica, right side: Secondary | ICD-10-CM

## 2023-05-03 DIAGNOSIS — M545 Low back pain, unspecified: Secondary | ICD-10-CM | POA: Diagnosis present

## 2023-05-03 MED ORDER — HYDROCODONE-ACETAMINOPHEN 5-325 MG PO TABS
1.0000 | ORAL_TABLET | Freq: Once | ORAL | Status: AC
  Administered 2023-05-03: 1 via ORAL
  Filled 2023-05-03: qty 1

## 2023-05-03 MED ORDER — PREDNISONE 10 MG PO TABS: ORAL_TABLET | ORAL | 0 refills | Status: DC

## 2023-05-03 MED ORDER — METHOCARBAMOL 500 MG PO TABS: 500.0000 mg | ORAL_TABLET | Freq: Four times a day (QID) | ORAL | 0 refills | Status: AC | PRN

## 2023-05-03 MED ORDER — KETOROLAC TROMETHAMINE 30 MG/ML IJ SOLN
30.0000 mg | Freq: Once | INTRAMUSCULAR | Status: AC
  Administered 2023-05-03: 30 mg via INTRAMUSCULAR
  Filled 2023-05-03: qty 1

## 2023-05-03 MED ORDER — METHOCARBAMOL 500 MG PO TABS
500.0000 mg | ORAL_TABLET | Freq: Once | ORAL | Status: AC
  Administered 2023-05-03: 500 mg via ORAL
  Filled 2023-05-03: qty 1

## 2023-05-03 MED ORDER — HYDROCODONE-ACETAMINOPHEN 5-325 MG PO TABS: 1.0000 | ORAL_TABLET | Freq: Four times a day (QID) | ORAL | 0 refills | Status: DC | PRN

## 2023-05-03 NOTE — ED Triage Notes (Signed)
Patient to ED via POV for right sided back pain that radiates down leg. Also having right knee swelling. No known injuries. Ambulatory to triage.

## 2023-05-03 NOTE — ED Provider Notes (Signed)
[facility] Provider Note    Event Date/Time   First MD Initiated Contact with Patient 05/03/23 [phone]     (approximate)   History   Back Pain   HPI  Katrina Page is a 42 y.o. female   presents to the ED with complaint of low back pain with radiation down her right lower extremity without recent injury.  Patient states she has had sciatica in the past.  She has tried over-the-counter medications for the last 3 to 4 days without any relief.  She denies any urinary or vaginal symptoms.  Patient has history of chronic back pain, major depressive disorder, alcohol abuse, left renal stone, pulmonary embolus and sepsis.      Physical Exam   Triage Vital Signs: ED Triage Vitals  Encounter Vitals Group     BP 05/03/23 0734 (!) 146/98     Systolic BP Percentile --      Diastolic BP Percentile --      Pulse Rate 05/03/23 0734 (!) 101     Resp 05/03/23 0734 18     Temp 05/03/23 0734 98.4 F (36.9 C)     Temp Source 05/03/23 0734 Oral     SpO2 05/03/23 0734 100 %     Weight 05/03/23 0734 300 lb (136.1 kg)     Height 05/03/23 0734 5\' 9"  (1.753 m)     Head Circumference --      Peak Flow --      Pain Score 05/03/23 0733 10     Pain Loc --      Pain Education --      Exclude from Growth Chart --     Most recent vital signs: Vitals:   05/03/23 0734  BP: (!) 146/98  Pulse: (!) 101  Resp: 18  Temp: 98.4 F (36.9 C)  SpO2: 100%     General: Awake, no distress.  CV:  Good peripheral perfusion.  Heart regular rate and rhythm. Resp:  Normal effort.  Lungs are clear bilaterally. Abd:  No distention.  Other:  Point tenderness on palpation of the lower lumbar region and right SI joint area and surrounding tissue.  Straight leg raises are approximately 45 degrees bilaterally.  Good muscle strength at 5/5.  Skin is intact.  No appreciable right lower extremity edema.  No effusion to the right knee.  Moderate tenderness on palpation of the medial aspect of  the right knee.  No skin discoloration or warmth noted.  Range of motion with flex and extension is limited secondary increased pain.   ED Results / Procedures / Treatments   Labs (all labs ordered are listed, but only abnormal results are displayed) Labs Reviewed - No data to display   RADIOLOGY  Lumbar spine x-ray images were reviewed and interpreted by myself independent of the radiologist and no fracture or dislocation noted.  Degenerative changes are noted and radiology report is mild multilevel degenerative disc changes. Right knee x-ray images were reviewed with no fracture or dislocation noted.  There is a small medial osteophyte noted.  Radiology reports small medial effusion with Pellegrini-Stieda lesion at the superior margin of the medial distal right femoral condyle, compatible with remote MCL injury.   PROCEDURES:  Critical Care performed:   Procedures   MEDICATIONS ORDERED IN ED: Medications  ketorolac (TORADOL) 30 MG/ML injection 30 mg (30 mg Intramuscular Given 05/03/23 0818)  methocarbamol (ROBAXIN) tablet 500 mg (500 mg Oral Given 05/03/23 1324)  HYDROcodone-acetaminophen (NORCO/VICODIN) 5-325 MG  per tablet 1 tablet (1 tablet Oral Given 05/03/23 0820)     IMPRESSION / MDM / ASSESSMENT AND PLAN / ED COURSE  I reviewed the triage vital signs and the nursing notes.   Differential diagnosis includes, but is not limited to, lower lumbar pain, radiculopathy right lower extremity, right knee pain, strain, dislocation, MCL tear, degenerative joint disease.  42 year old female presents to the ED with complaint of low back pain with radiation into her right extremity without injury.  Patient has been taking over-the-counter medication without any relief.  X-ray images were discussed with patient and also she is aware that this is continuation of her sciatica.  She was encouraged to follow-up with orthopedics about her right knee as she possibly has an MCL tear/injury per  x-ray.  She was placed in a hinged knee brace to use for added protection and support.  A prescription for prednisone taper, methocarbamol and hydrocodone was sent to the pharmacy for her to take.  She is aware that she cannot take the methocarbamol and hydrocodone while driving or operating machinery as it could cause drowsiness and increase her risk for injury.  She is to follow-up with Dr. Audelia Acton who is on-call for orthopedics.      Patient's presentation is most consistent with acute illness / injury with system symptoms.  FINAL CLINICAL IMPRESSION(S) / ED DIAGNOSES   Final diagnoses:  Right sided sciatica  Acute pain of right knee     Rx / DC Orders   ED Discharge Orders          Ordered    HYDROcodone-acetaminophen (NORCO/VICODIN) 5-325 MG tablet  Every 6 hours PRN        05/03/23 1002    predniSONE (DELTASONE) 10 MG tablet        05/03/23 1002    methocarbamol (ROBAXIN) 500 MG tablet  Every 6 hours PRN        05/03/23 1002             Note:  This document was prepared using Dragon voice recognition software and may include unintentional dictation errors.   Tommi Rumps, PA-C 05/03/23 1418    Minna Antis, MD 05/03/23 1506

## 2023-05-03 NOTE — Discharge Instructions (Addendum)
Call make an appoint with Dr. Audelia Acton who is on-call for [facility] orthopedic department.  Ice and elevation for your knee as needed for discomfort.  Wear the knee brace when you are up walking.  You do not have to wear this while sleeping.  A prescription for hydrocodone for pain and methocarbamol for muscles was sent to the pharmacy.  Do not take these medications while driving or operating machinery.  The prednisone is to be taken daily as a tapering dose starting with 6 tablets today and tapering down to 1 tablet on the 6-day.  You may use ice or heat to your back as needed for discomfort.

## 2023-05-03 NOTE — ED Notes (Signed)
See triage note  Presents with back pain  States pain moves into right leg States this started 3 days ago  Hx of sciatica Also noticed swelling to right knee 3 days ago   Also denies any injury  Ambulates with slight limp d/t pain

## 2023-05-22 ENCOUNTER — Ambulatory Visit: Payer: MEDICAID | Admitting: Physician Assistant

## 2023-05-22 DIAGNOSIS — Z87442 Personal history of urinary calculi: Secondary | ICD-10-CM | POA: Insufficient documentation

## 2023-05-22 DIAGNOSIS — Z6841 Body Mass Index (BMI) 40.0 and over, adult: Secondary | ICD-10-CM | POA: Insufficient documentation

## 2023-09-22 ENCOUNTER — Other Ambulatory Visit: Payer: Self-pay

## 2023-09-22 ENCOUNTER — Emergency Department
Admission: EM | Admit: 2023-09-22 | Discharge: 2023-09-22 | Disposition: A | Discharge status: Home / Self Care | Payer: MEDICAID | Attending: Emergency Medicine | Admitting: Emergency Medicine

## 2023-09-22 ENCOUNTER — Emergency Department: Payer: MEDICAID

## 2023-09-22 DIAGNOSIS — E860 Dehydration: Secondary | ICD-10-CM | POA: Diagnosis not present

## 2023-09-22 DIAGNOSIS — Z20822 Contact with and (suspected) exposure to covid-19: Secondary | ICD-10-CM | POA: Insufficient documentation

## 2023-09-22 DIAGNOSIS — J069 Acute upper respiratory infection, unspecified: Secondary | ICD-10-CM | POA: Diagnosis not present

## 2023-09-22 DIAGNOSIS — J189 Pneumonia, unspecified organism: Secondary | ICD-10-CM

## 2023-09-22 DIAGNOSIS — R059 Cough, unspecified: Secondary | ICD-10-CM | POA: Diagnosis present

## 2023-09-22 LAB — CBC
HCT: 41 % (ref 36.0–46.0)
Hemoglobin: 12.8 g/dL (ref 12.0–15.0)
MCH: 27.1 pg (ref 26.0–34.0)
MCHC: 31.2 g/dL (ref 30.0–36.0)
MCV: 86.7 fL (ref 80.0–100.0)
Platelets: 372 10*3/uL (ref 150–400)
RBC: 4.73 MIL/uL (ref 3.87–5.11)
RDW: 16.2 % — ABNORMAL HIGH (ref 11.5–15.5)
WBC: 15.3 10*3/uL — ABNORMAL HIGH (ref 4.0–10.5)
nRBC: 0 % (ref 0.0–0.2)

## 2023-09-22 LAB — BASIC METABOLIC PANEL
Anion gap: 16 — ABNORMAL HIGH (ref 5–15)
BUN: 7 mg/dL (ref 6–20)
CO2: 22 mmol/L (ref 22–32)
Calcium: 9.3 mg/dL (ref 8.9–10.3)
Chloride: 100 mmol/L (ref 98–111)
Creatinine, Ser: 0.98 mg/dL (ref 0.44–1.00)
GFR, Estimated: >60 mL/min (ref >60)
Glucose, Bld: 115 mg/dL — ABNORMAL HIGH (ref 70–99)
Potassium: 3.2 mmol/L — ABNORMAL LOW (ref 3.5–5.1)
Sodium: 138 mmol/L (ref 135–145)

## 2023-09-22 LAB — RESP PANEL BY RT-PCR (RSV, FLU A&B, COVID)  RVPGX2
Influenza A by PCR: NEGATIVE
Influenza B by PCR: NEGATIVE
Resp Syncytial Virus by PCR: NEGATIVE
SARS Coronavirus 2 by RT PCR: NEGATIVE

## 2023-09-22 LAB — SARS CORONAVIRUS 2 BY RT PCR: SARS Coronavirus 2 by RT PCR: NEGATIVE

## 2023-09-22 MED ORDER — AZITHROMYCIN 500 MG PO TABS
500.0000 mg | ORAL_TABLET | Freq: Once | ORAL | Status: AC
  Administered 2023-09-22: 500 mg via ORAL
  Filled 2023-09-22: qty 1

## 2023-09-22 MED ORDER — HYDROCOD POLI-CHLORPHE POLI ER 10-8 MG/5ML PO SUER: 5.0000 mL | Freq: Two times a day (BID) | ORAL | 0 refills | Status: DC | PRN

## 2023-09-22 MED ORDER — ONDANSETRON 4 MG PO TBDP: 4.0000 mg | ORAL_TABLET | Freq: Three times a day (TID) | ORAL | 0 refills | Status: DC | PRN

## 2023-09-22 MED ORDER — ONDANSETRON HCL 4 MG/2ML IJ SOLN
4.0000 mg | Freq: Once | INTRAMUSCULAR | Status: AC
  Administered 2023-09-22: 4 mg via INTRAVENOUS
  Filled 2023-09-22: qty 2

## 2023-09-22 MED ORDER — CEPHALEXIN 500 MG PO CAPS: 500.0000 mg | ORAL_CAPSULE | Freq: Three times a day (TID) | ORAL | 0 refills | Status: AC

## 2023-09-22 MED ORDER — CEPHALEXIN 500 MG PO CAPS: 500.0000 mg | ORAL_CAPSULE | Freq: Three times a day (TID) | ORAL | 0 refills | Status: DC

## 2023-09-22 MED ORDER — AZITHROMYCIN 250 MG PO TABS: ORAL_TABLET | ORAL | 0 refills | Status: DC

## 2023-09-22 MED ORDER — SODIUM CHLORIDE 0.9 % IV BOLUS
500.0000 mL | Freq: Once | INTRAVENOUS | Status: AC
  Administered 2023-09-22: 500 mL via INTRAVENOUS

## 2023-09-22 MED ORDER — CEPHALEXIN 500 MG PO CAPS
500.0000 mg | ORAL_CAPSULE | Freq: Once | ORAL | Status: AC
  Administered 2023-09-22: 500 mg via ORAL
  Filled 2023-09-22: qty 1

## 2023-09-22 NOTE — ED Triage Notes (Signed)
 Pt arrives via POV with CC of cough, fever, congestion, and nausea with intermittent vomiting for the last 5 days. Family in the house has also been sick.

## 2023-09-22 NOTE — ED Provider Notes (Signed)
 [facility] Provider Note    Event Date/Time   First MD Initiated Contact with Patient 09/22/23 2121     (approximate)   History   Cough   HPI  Katrina Page is a 42 y.o. female with history of thrombocytopenia, PE, migraines presents emergency department with cough, fever, chills, congestion, vomiting and diarrhea for 5 days.  Other family members have been sick.  Patient states she just feels very weak and tired.  Denies chest pain      Physical Exam   Triage Vital Signs: ED Triage Vitals  Encounter Vitals Group     BP 09/22/23 1941 (!) 159/80     Systolic BP Percentile --      Diastolic BP Percentile --      Pulse Rate 09/22/23 1941 (!) 101     Resp 09/22/23 1941 (!) 22     Temp 09/22/23 1941 98.4 F (36.9 C)     Temp Source 09/22/23 1941 Oral     SpO2 09/22/23 1941 100 %     Weight 09/22/23 1942 210 lb (95.3 kg)     Height 09/22/23 1942 5' 9 (1.753 m)     Head Circumference --      Peak Flow --      Pain Score 09/22/23 1942 9     Pain Loc --      Pain Education --      Exclude from Growth Chart --     Most recent vital signs: Vitals:   09/22/23 1941  BP: (!) 159/80  Pulse: (!) 101  Resp: (!) 22  Temp: 98.4 F (36.9 C)  SpO2: 100%     General: Awake, no distress.   CV:  Good peripheral perfusion. regular rate and  rhythm Resp:  Normal effort. Lungs CTA Abd:  No distention.  Nontender Other:     ED Results / Procedures / Treatments   Labs (all labs ordered are listed, but only abnormal results are displayed) Labs Reviewed  CBC - Abnormal; Notable for the following components:      Result Value   WBC 15.3 (*)    RDW 16.2 (*)    All other components within normal limits  BASIC METABOLIC PANEL - Abnormal; Notable for the following components:   Potassium 3.2 (*)    Glucose, Bld 115 (*)    Anion gap 16 (*)    All other components within normal limits  SARS CORONAVIRUS 2 BY RT PCR  RESP PANEL BY RT-PCR (RSV, FLU  A&B, COVID)  [id number]     EKG     RADIOLOGY Chest x-ray    PROCEDURES:   Procedures   MEDICATIONS ORDERED IN ED: Medications  sodium chloride  0.9 % bolus 500 mL (500 mLs Intravenous New Bag/Given 09/22/23 2157)  ondansetron  (ZOFRAN ) injection 4 mg (4 mg Intravenous Given 09/22/23 2157)  azithromycin  (ZITHROMAX ) tablet 500 mg (500 mg Oral Given 09/22/23 2231)  cephALEXin  (KEFLEX ) capsule 500 mg (500 mg Oral Given 09/22/23 2231)     IMPRESSION / MDM / ASSESSMENT AND PLAN / ED COURSE  I reviewed the triage vital signs and the nursing notes.                              Differential diagnosis includes, but is not limited to, COVID, influenza, RSV, acute bronchitis, CAP, viral illness, dehydration  Patient's presentation is most consistent with acute illness / injury with system  symptoms.   COVID test negative  Metabolic panel reassuring  CBC has elevated WBC 15.3 indicating infection  Did add flu and RSV to her respiratory panel which is still pending  Chest x-ray independently reviewed interpreted by me as being positive for pneumonia  I did explain these findings to the patient.  She was given normal saline 500 mL IV while here in the ED as she is a little tachycardic.  Feel she has some dehydration due to the nausea and vomiting and diarrhea.  With the pneumonia we went ahead and gave her Zithromax  and Keflex  p.o. while here in the ED.  Gave her prescription for Zithromax  Keflex  and Tussionex for her CAP, Zofran  4 mg ODT for nausea and vomiting.  Patient is in agreement with treatment plan.  Strict instructions to return emergency department worsening.  Discharged in stable condition.      FINAL CLINICAL IMPRESSION(S) / ED DIAGNOSES   Final diagnoses:  Community acquired pneumonia, unspecified laterality  Dehydration     Rx / DC Orders   ED Discharge Orders          Ordered    azithromycin  (ZITHROMAX  Z-PAK) 250 MG tablet  Status:  Discontinued         09/22/23 2226    cephALEXin  (KEFLEX ) 500 MG capsule  3 times daily,   Status:  Discontinued        09/22/23 2226    chlorpheniramine-HYDROcodone  (TUSSIONEX) 10-8 MG/5ML  Every 12 hours PRN,   Status:  Discontinued        09/22/23 2226    chlorpheniramine-HYDROcodone  (TUSSIONEX) 10-8 MG/5ML  Every 12 hours PRN        09/22/23 2231    cephALEXin  (KEFLEX ) 500 MG capsule  3 times daily        09/22/23 2231    azithromycin  (ZITHROMAX  Z-PAK) 250 MG tablet        09/22/23 2231    ondansetron  (ZOFRAN -ODT) 4 MG disintegrating tablet  Every 8 hours PRN        09/22/23 2231             Note:  This document was prepared using Dragon voice recognition software and may include unintentional dictation errors.    Gasper Devere ORN, PA-C 09/22/23 2233    Arlander Charleston, MD 09/22/23 2239

## 2023-09-22 NOTE — ED Provider Triage Note (Signed)
 Emergency Medicine Provider Triage Evaluation Note  Katrina Page , a 42 y.o. female  was evaluated in triage.  Pt complains of productive cough, chills, fever, anorexia asthenia.  Sick contacts at home  Review of Systems  Positive:  Negative:   Physical Exam  There were no vitals taken for this visit. Gen:   Awake, no distress   Resp:  Normal effort  MSK:   Moves extremities without difficulty  Other:    Medical Decision Making  Medically screening exam initiated at 7:39 PM.  Appropriate orders placed.  [name] was informed that the remainder of the evaluation will be completed by another provider, this initial triage assessment does not replace that evaluation, and the importance of remaining in the ED until their evaluation is complete.  Patient presents with upper respiratory infection she is going to have respiratory panel checks x-ray CBC a CMP   Janit Kast, PA-C 09/22/23 1940

## 2023-09-30 ENCOUNTER — Emergency Department: Payer: MEDICAID

## 2023-09-30 ENCOUNTER — Inpatient Hospital Stay
Admission: EM | Admit: 2023-09-30 | Discharge: 2023-10-03 | DRG: 194 | Disposition: A | Discharge status: Home / Self Care | Payer: MEDICAID | Attending: Family Medicine | Admitting: Family Medicine

## 2023-09-30 ENCOUNTER — Other Ambulatory Visit: Payer: Self-pay

## 2023-09-30 ENCOUNTER — Encounter: Payer: Self-pay | Admitting: Emergency Medicine

## 2023-09-30 DIAGNOSIS — Z720 Tobacco use: Secondary | ICD-10-CM | POA: Diagnosis not present

## 2023-09-30 DIAGNOSIS — F1010 Alcohol abuse, uncomplicated: Secondary | ICD-10-CM | POA: Diagnosis present

## 2023-09-30 DIAGNOSIS — E669 Obesity, unspecified: Secondary | ICD-10-CM

## 2023-09-30 DIAGNOSIS — J440 Chronic obstructive pulmonary disease with acute lower respiratory infection: Secondary | ICD-10-CM | POA: Diagnosis present

## 2023-09-30 DIAGNOSIS — Z6831 Body mass index (BMI) 31.0-31.9, adult: Secondary | ICD-10-CM

## 2023-09-30 DIAGNOSIS — F17210 Nicotine dependence, cigarettes, uncomplicated: Secondary | ICD-10-CM | POA: Diagnosis present

## 2023-09-30 DIAGNOSIS — Z1152 Encounter for screening for COVID-19: Secondary | ICD-10-CM

## 2023-09-30 DIAGNOSIS — Z79899 Other long term (current) drug therapy: Secondary | ICD-10-CM | POA: Diagnosis not present

## 2023-09-30 DIAGNOSIS — Z888 Allergy status to other drugs, medicaments and biological substances status: Secondary | ICD-10-CM | POA: Diagnosis not present

## 2023-09-30 DIAGNOSIS — J189 Pneumonia, unspecified organism: Secondary | ICD-10-CM | POA: Diagnosis present

## 2023-09-30 DIAGNOSIS — D179 Benign lipomatous neoplasm, unspecified: Secondary | ICD-10-CM | POA: Diagnosis present

## 2023-09-30 DIAGNOSIS — Z86711 Personal history of pulmonary embolism: Secondary | ICD-10-CM | POA: Diagnosis not present

## 2023-09-30 DIAGNOSIS — E66811 Obesity, class 1: Secondary | ICD-10-CM | POA: Diagnosis not present

## 2023-09-30 DIAGNOSIS — R0602 Shortness of breath: Secondary | ICD-10-CM | POA: Diagnosis present

## 2023-09-30 DIAGNOSIS — E876 Hypokalemia: Secondary | ICD-10-CM | POA: Diagnosis present

## 2023-09-30 DIAGNOSIS — Z8249 Family history of ischemic heart disease and other diseases of the circulatory system: Secondary | ICD-10-CM

## 2023-09-30 DIAGNOSIS — E6609 Other obesity due to excess calories: Secondary | ICD-10-CM | POA: Diagnosis not present

## 2023-09-30 DIAGNOSIS — F1910 Other psychoactive substance abuse, uncomplicated: Secondary | ICD-10-CM | POA: Insufficient documentation

## 2023-09-30 DIAGNOSIS — R110 Nausea: Secondary | ICD-10-CM | POA: Diagnosis present

## 2023-09-30 DIAGNOSIS — N200 Calculus of kidney: Secondary | ICD-10-CM | POA: Diagnosis present

## 2023-09-30 DIAGNOSIS — R531 Weakness: Principal | ICD-10-CM

## 2023-09-30 LAB — CBC
HCT: 40.4 % (ref 36.0–46.0)
Hemoglobin: 13 g/dL (ref 12.0–15.0)
MCH: 26.9 pg (ref 26.0–34.0)
MCHC: 32.2 g/dL (ref 30.0–36.0)
MCV: 83.5 fL (ref 80.0–100.0)
Platelets: 309 10*3/uL (ref 150–400)
RBC: 4.84 MIL/uL (ref 3.87–5.11)
RDW: 15.9 % — ABNORMAL HIGH (ref 11.5–15.5)
WBC: 14.4 10*3/uL — ABNORMAL HIGH (ref 4.0–10.5)
nRBC: 0 % (ref 0.0–0.2)

## 2023-09-30 LAB — HIV ANTIBODY (ROUTINE TESTING W REFLEX): HIV Screen 4th Generation wRfx: NONREACTIVE

## 2023-09-30 LAB — URINE DRUG SCREEN, QUALITATIVE (ARMC ONLY)
Amphetamines, Ur Screen: NOT DETECTED
Barbiturates, Ur Screen: NOT DETECTED
Benzodiazepine, Ur Scrn: NOT DETECTED
Cannabinoid 50 Ng, Ur ~~LOC~~: POSITIVE — AB
Cocaine Metabolite,Ur ~~LOC~~: NOT DETECTED
MDMA (Ecstasy)Ur Screen: NOT DETECTED
Methadone Scn, Ur: NOT DETECTED
Opiate, Ur Screen: POSITIVE — AB
Phencyclidine (PCP) Ur S: NOT DETECTED
Tricyclic, Ur Screen: NOT DETECTED

## 2023-09-30 LAB — HEPATIC FUNCTION PANEL
ALT: 18 U/L (ref 0–44)
AST: 36 U/L (ref 15–41)
Albumin: 4 g/dL (ref 3.5–5.0)
Alkaline Phosphatase: 76 U/L (ref 38–126)
Bilirubin, Direct: 0.2 mg/dL (ref 0.0–0.2)
Indirect Bilirubin: 1.1 mg/dL — ABNORMAL HIGH (ref 0.3–0.9)
Total Bilirubin: 1.3 mg/dL — ABNORMAL HIGH (ref 0.0–1.2)
Total Protein: 8 g/dL (ref 6.5–8.1)

## 2023-09-30 LAB — BASIC METABOLIC PANEL
Anion gap: 21 — ABNORMAL HIGH (ref 5–15)
BUN: 8 mg/dL (ref 6–20)
CO2: 24 mmol/L (ref 22–32)
Calcium: 9.4 mg/dL (ref 8.9–10.3)
Chloride: 100 mmol/L (ref 98–111)
Creatinine, Ser: 0.95 mg/dL (ref 0.44–1.00)
GFR, Estimated: >60 mL/min (ref >60)
Glucose, Bld: 149 mg/dL — ABNORMAL HIGH (ref 70–99)
Potassium: 3 mmol/L — ABNORMAL LOW (ref 3.5–5.1)
Sodium: 145 mmol/L (ref 135–145)

## 2023-09-30 LAB — TROPONIN I (HIGH SENSITIVITY)
Troponin I (High Sensitivity): 8 ng/L (ref <18)
Troponin I (High Sensitivity): 9 ng/L (ref <18)

## 2023-09-30 LAB — RESP PANEL BY RT-PCR (RSV, FLU A&B, COVID)  RVPGX2
Influenza A by PCR: NEGATIVE
Influenza B by PCR: NEGATIVE
Resp Syncytial Virus by PCR: NEGATIVE
SARS Coronavirus 2 by RT PCR: NEGATIVE

## 2023-09-30 LAB — ETHANOL: Alcohol, Ethyl (B): <10 mg/dL (ref <10)

## 2023-09-30 LAB — ACETAMINOPHEN LEVEL: Acetaminophen (Tylenol), Serum: <10 ug/mL — ABNORMAL LOW (ref 10–30)

## 2023-09-30 LAB — POC URINE PREG, ED: Preg Test, Ur: NEGATIVE

## 2023-09-30 LAB — LACTIC ACID, PLASMA: Lactic Acid, Venous: 1.2 mmol/L (ref 0.5–1.9)

## 2023-09-30 LAB — SALICYLATE LEVEL: Salicylate Lvl: <7 mg/dL — ABNORMAL LOW (ref 7.0–30.0)

## 2023-09-30 LAB — STREP PNEUMONIAE URINARY ANTIGEN: Strep Pneumo Urinary Antigen: NEGATIVE

## 2023-09-30 LAB — LIPASE, BLOOD: Lipase: 20 U/L (ref 11–51)

## 2023-09-30 MED ORDER — SODIUM CHLORIDE 0.9 % IV SOLN: INTRAVENOUS | Status: AC

## 2023-09-30 MED ORDER — HYDROCODONE-ACETAMINOPHEN 5-325 MG PO TABS
1.0000 | ORAL_TABLET | Freq: Once | ORAL | Status: AC
  Administered 2023-09-30: 1 via ORAL
  Filled 2023-09-30: qty 1

## 2023-09-30 MED ORDER — ONDANSETRON HCL 4 MG PO TABS
4.0000 mg | ORAL_TABLET | Freq: Four times a day (QID) | ORAL | Status: DC | PRN
  Administered 2023-10-01 – 2023-10-02 (×3): 4 mg via ORAL
  Filled 2023-09-30 (×3): qty 1

## 2023-09-30 MED ORDER — LACTATED RINGERS IV SOLN: INTRAVENOUS | Status: AC

## 2023-09-30 MED ORDER — ENOXAPARIN SODIUM 40 MG/0.4ML IJ SOSY: 40.0000 mg | PREFILLED_SYRINGE | INTRAMUSCULAR | Status: DC

## 2023-09-30 MED ORDER — SODIUM CHLORIDE 0.9 % IV SOLN
2.0000 g | Freq: Once | INTRAVENOUS | Status: AC
  Administered 2023-09-30: 2 g via INTRAVENOUS
  Filled 2023-09-30: qty 20

## 2023-09-30 MED ORDER — ONDANSETRON HCL 4 MG/2ML IJ SOLN
4.0000 mg | Freq: Four times a day (QID) | INTRAMUSCULAR | Status: DC | PRN
  Administered 2023-09-30 – 2023-10-03 (×6): 4 mg via INTRAVENOUS
  Filled 2023-09-30 (×6): qty 2

## 2023-09-30 MED ORDER — SODIUM CHLORIDE 0.9 % IV SOLN
500.0000 mg | INTRAVENOUS | Status: DC
  Administered 2023-10-01 – 2023-10-02 (×2): 500 mg via INTRAVENOUS
  Filled 2023-09-30 (×4): qty 5

## 2023-09-30 MED ORDER — SODIUM CHLORIDE 0.9 % IV SOLN
500.0000 mg | Freq: Once | INTRAVENOUS | Status: AC
  Administered 2023-09-30: 500 mg via INTRAVENOUS
  Filled 2023-09-30: qty 5

## 2023-09-30 MED ORDER — KETOROLAC TROMETHAMINE 15 MG/ML IJ SOLN
15.0000 mg | Freq: Four times a day (QID) | INTRAMUSCULAR | Status: AC | PRN
  Administered 2023-09-30 – 2023-10-02 (×6): 15 mg via INTRAVENOUS
  Filled 2023-09-30 (×7): qty 1

## 2023-09-30 MED ORDER — PREDNISONE 10 MG PO TABS
40.0000 mg | ORAL_TABLET | Freq: Every day | ORAL | Status: DC
  Administered 2023-09-30 – 2023-10-02 (×3): 40 mg via ORAL
  Filled 2023-09-30: qty 2
  Filled 2023-09-30: qty 4
  Filled 2023-09-30: qty 2

## 2023-09-30 MED ORDER — ACETAMINOPHEN 325 MG PO TABS
650.0000 mg | ORAL_TABLET | Freq: Four times a day (QID) | ORAL | Status: DC | PRN
  Administered 2023-10-01 – 2023-10-03 (×6): 650 mg via ORAL
  Filled 2023-09-30 (×6): qty 2

## 2023-09-30 MED ORDER — SODIUM CHLORIDE 0.9 % IV SOLN
2.0000 g | INTRAVENOUS | Status: DC
  Administered 2023-10-01 – 2023-10-03 (×3): 2 g via INTRAVENOUS
  Filled 2023-09-30 (×3): qty 20

## 2023-09-30 MED ORDER — SODIUM CHLORIDE 0.9 % IV BOLUS
1000.0000 mL | Freq: Once | INTRAVENOUS | Status: AC
  Administered 2023-09-30: 1000 mL via INTRAVENOUS

## 2023-09-30 MED ORDER — ENOXAPARIN SODIUM 60 MG/0.6ML IJ SOSY
0.5000 mg/kg | PREFILLED_SYRINGE | INTRAMUSCULAR | Status: DC
  Administered 2023-10-01 – 2023-10-02 (×2): 47.5 mg via SUBCUTANEOUS
  Filled 2023-09-30 (×2): qty 0.6

## 2023-09-30 MED ORDER — IOHEXOL 350 MG/ML SOLN
100.0000 mL | Freq: Once | INTRAVENOUS | Status: AC | PRN
  Administered 2023-09-30: 100 mL via INTRAVENOUS

## 2023-09-30 MED ORDER — HYDROMORPHONE HCL 1 MG/ML IJ SOLN
0.5000 mg | Freq: Once | INTRAMUSCULAR | Status: AC
  Administered 2023-09-30: 0.5 mg via INTRAVENOUS
  Filled 2023-09-30: qty 0.5

## 2023-09-30 MED ORDER — SODIUM CHLORIDE 0.9 % IV SOLN
12.5000 mg | Freq: Four times a day (QID) | INTRAVENOUS | Status: DC | PRN
  Administered 2023-09-30 – 2023-10-03 (×7): 12.5 mg via INTRAVENOUS
  Filled 2023-09-30 (×2): qty 12.5
  Filled 2023-09-30 (×2): qty 0.5
  Filled 2023-09-30: qty 12.5
  Filled 2023-09-30: qty 0.5
  Filled 2023-09-30: qty 12.5

## 2023-09-30 MED ORDER — ONDANSETRON HCL 4 MG/2ML IJ SOLN
4.0000 mg | Freq: Once | INTRAMUSCULAR | Status: AC
  Administered 2023-09-30: 4 mg via INTRAVENOUS
  Filled 2023-09-30: qty 2

## 2023-09-30 NOTE — Assessment & Plan Note (Signed)
 Denies regular ETOH use at present  Check ETOH level  Monitor

## 2023-09-30 NOTE — Progress Notes (Signed)
 PHARMACIST - PHYSICIAN COMMUNICATION  CONCERNING:  Enoxaparin  (Lovenox ) for DVT Prophylaxis    RECOMMENDATION: Patient was prescribed enoxaprin 40mg  q24 hours for VTE prophylaxis.   Filed Weights   09/30/23 0915  Weight: 95.3 kg (210 lb)    Body mass index is 31.01 kg/m.  Estimated Creatinine Clearance: 92.8 mL/min (by C-G formula based on SCr of 0.95 mg/dL).   Based on [facility] policy patient is candidate for enoxaparin  0.5mg /kg TBW SQ every 24 hours based on BMI being >30.  DESCRIPTION: Pharmacy has adjusted enoxaparin  dose per [facility] policy.  Patient is now receiving enoxaparin  47.5 mg every 24 hours    Olam KANDICE Fritter, PharmD Clinical Pharmacist  09/30/2023 2:36 PM

## 2023-09-30 NOTE — ED Notes (Signed)
 Pt is a hard stick. Lab to come and draw pt blood cultures.

## 2023-09-30 NOTE — Assessment & Plan Note (Signed)
 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

## 2023-09-30 NOTE — ED Notes (Signed)
 Pt complaint of nausea. MD made aware.

## 2023-09-30 NOTE — Assessment & Plan Note (Signed)
1/2 PPD smoker  Discussed cessation  Nicotine patch

## 2023-09-30 NOTE — ED Notes (Signed)
 2 cups of ice provided to patient and family member upon their request.

## 2023-09-30 NOTE — ED Triage Notes (Signed)
 Pt arrived via [facility] from the [facility] with reports of shortness of breath, dx with PNA about a week ago, reports she has been compliant with medications, Pt reports coughing so hard that she is vomiting. Pt c/o nausea as well.   Per EMS pt was hyperventilating.

## 2023-09-30 NOTE — H&P (Addendum)
 History and Physical    Patient: Katrina Page DOB: Apr 26, 1981 DOA: 09/30/2023 DOS: the patient was seen and examined on 09/30/2023 PCP: [facility]  Patient coming from: Home  Chief Complaint:  Chief Complaint  Patient presents with   Shortness of Breath   Chest Pain   HPI: Katrina Page is a 43 y.o. female with medical history significant of obesity, history of PE, tobacco abuse, alcohol use presenting with pneumonia.  Patient reports cough since roughly New Year's Eve.  Was evaluated in the ER on December 31 and diagnosed with pneumonia.  Was placed on course of Keflex  as well as azithromycin .  Patient reports persistent symptoms despite treatment.  Positive cough, wheezing, shortness of breath.  Minimal chest pain.  No abdominal pain.  Positive nausea.  Currently smoking 1/2 pack/day.  Denies any regular alcohol use at present.  No focal hemiparesis or confusion.  Has had progressive fatigue predominantly over the past few days. Presented to the ER afebrile, hemodynamically stable.  Satting well on room air.  White count 14.4, hemoglobin 13, platelets 309, alcohol level within normal limits.  COVID flu and RSV negative.  Creatinine 0.95.  LFTs within normal limits.  T. bili 1.3.  CTA of the chest negative for PE.  Showing left lower lobe pneumonia.  Noted bilateral nonobstructing nephrolithiasis.  Bilateral adrenal myelo lipomas. Review of Systems: As mentioned in the history of present illness. All other systems reviewed and are negative. Past Medical History:  Diagnosis Date   Adrenal nodule (HCC) 06/2019   left   Alcohol abuse    Anemia    Bronchitis    Depression    History of kidney stones    Migraines    Nodule of right lung 06/2019   Pulmonary emboli (HCC) 07/23/2019   bilateral   Thrombocytopenia (HCC)    Past Surgical History:  Procedure Laterality Date   APPENDECTOMY     CHOLECYSTECTOMY     CYSTOSCOPY W/ RETROGRADES Left  03/05/2020   Procedure: CYSTOSCOPY WITH RETROGRADE PYELOGRAM;  Surgeon: Penne Knee, MD;  Location: [facility];  Service: Urology;  Laterality: Left;   CYSTOSCOPY WITH STENT PLACEMENT Left 02/09/2020   Procedure: CYSTOSCOPY WITH STENT PLACEMENT;  Surgeon: Penne Knee, MD;  Location: [facility];  Service: Urology;  Laterality: Left;   CYSTOSCOPY/URETEROSCOPY/HOLMIUM LASER/STENT PLACEMENT Left 03/05/2020   Procedure: CYSTOSCOPY/URETEROSCOPY/HOLMIUM LASER/STENT EXCHANGE;  Surgeon: Penne Knee, MD;  Location: [facility];  Service: Urology;  Laterality: Left;   ESOPHAGOGASTRODUODENOSCOPY (EGD) WITH PROPOFOL  N/A 03/27/2020   Procedure: ESOPHAGOGASTRODUODENOSCOPY (EGD) WITH PROPOFOL ;  Surgeon: Jinny Carmine, MD;  Location: [facility];  Service: Endoscopy;  Laterality: N/A;   TUBAL LIGATION     Social History:  reports that she has been smoking cigarettes. She has a 5 pack-year smoking history. She has never used smokeless tobacco. She reports current alcohol use of about 6.0 standard drinks of alcohol per week. She reports that she does not use drugs.  Allergies  Allergen Reactions   Bupropion Other (See Comments)    Seizures  Other Reaction(s): Other (See Comments), Other (See Comments)  seizure  Seizure    Family History  Problem Relation Age of Onset   Hypertension Mother    Diabetes Mother    Stroke Mother    Breast cancer Mother     Prior to Admission medications   Medication Sig Start Date End Date Taking? Authorizing Provider  cephALEXin  (KEFLEX ) 500 MG capsule Take 500 mg by mouth 3 (three)  times daily. 09/24/23  Yes [provider]  meloxicam  (MOBIC ) 15 MG tablet Take 1 tablet by mouth daily. 07/06/23  Yes [provider]  tiZANidine (ZANAFLEX) 4 MG tablet Take 1 tablet by mouth at bedtime as needed. 07/06/23  Yes [provider]  albuterol  (VENTOLIN  HFA) 108 (90 Base) MCG/ACT inhaler Inhale 1-2 puffs into the lungs every 4 (four) hours as needed  for wheezing or shortness of breath. 01/02/20   Clapacs, Norleen DASEN, MD  azithromycin  (ZITHROMAX  Z-PAK) 250 MG tablet 2 pills today then 1 pill a day for 4 days 09/22/23   Gasper Devere ORN, PA-C  chlordiazePOXIDE  (LIBRIUM ) 5 MG capsule Take 6 caps (30 mg) 4x daily on day 1. Take 5 caps (25 mg) 4x daily on day 2. Take 4 caps (20 mg) 4x daily on day 3. Take 3 caps (15 mg) 4x daily on day 4. Take 2 caps (10 mg) 4x daily on day 5. Take 2 caps (10 mg) 3x daily on day 6. Take 1 cap (5 mg) 3x daily on day 7. Take 1 cap (5 mg) twice daily on day 8. Take 1 cap (5 mg) in the evening on day 9. 11/19/20   Gordan Huxley, MD  chlorpheniramine-HYDROcodone  (TUSSIONEX) 10-8 MG/5ML Take 5 mLs by mouth every 12 (twelve) hours as needed for cough. 09/22/23   Fisher, Devere ORN, PA-C  cyclobenzaprine  (FLEXERIL ) 5 MG tablet Take 1 tablet (5 mg total) by mouth daily as needed for muscle spasms. 04/06/23   Margrette, Myah A, PA-C  famotidine  (PEPCID ) 20 MG tablet Take 1 tablet (20 mg total) by mouth 2 (two) times daily. 11/10/20   Viviann Pastor, MD  Folic Acid -Vit B6-Vit B12 0.4-50-0.1 MG TABS SMARTSIG:1 Tablet(s) By Mouth Daily 03/29/20   [provider]  gabapentin  (NEURONTIN ) 300 MG capsule Take 1 capsule (300 mg total) by mouth 3 (three) times daily. 11/19/20 11/19/21  Gordan Huxley, MD  HYDROcodone -acetaminophen  (NORCO/VICODIN) 5-325 MG tablet Take 1 tablet by mouth every 6 (six) hours as needed. 05/03/23 05/02/24  Saunders Shona CROME, PA-C  methocarbamol  (ROBAXIN ) 500 MG tablet Take 1 tablet (500 mg total) by mouth every 6 (six) hours as needed for muscle spasms. 05/03/23   Saunders Shona CROME, PA-C  ondansetron  (ZOFRAN -ODT) 4 MG disintegrating tablet Take 1 tablet (4 mg total) by mouth every 8 (eight) hours as needed. 09/22/23   Fisher, Devere ORN, PA-C  oxybutynin  (DITROPAN ) 5 MG tablet Take 1 tablet (5 mg total) by mouth every 8 (eight) hours as needed for bladder spasms. 02/12/20   Amin, Sumayya, MD  pantoprazole  (PROTONIX ) 40 MG  tablet Take 1 tablet (40 mg total) by mouth daily. 03/27/20   Patel, Sona, MD  Potassium Chloride  ER 20 MEQ TBCR Take 20 mEq by mouth daily.  03/29/20   [provider]  predniSONE  (DELTASONE ) 10 MG tablet Take 6 tablets  today, on day 2 take 5 tablets, day 3 take 4 tablets, day 4 take 3 tablets, day 5 take  2 tablets and 1 tablet the last day 05/03/23   Saunders Shona CROME, PA-C  promethazine  (PHENERGAN ) 25 MG tablet Take 1 tablet (25 mg total) by mouth every 6 (six) hours as needed for nausea or vomiting. 08/27/20   Saunders Shona CROME, PA-C  Venlafaxine  HCl 150 MG TB24 Take 1 tablet by mouth daily. 05/07/20   [provider]  vitamin B-12 1000 MCG tablet Take 1 tablet (1,000 mcg total) by mouth daily. 02/13/20   Caleen Qualia, MD  Physical Exam: Vitals:   09/30/23 0914 09/30/23 0915 09/30/23 1220  BP: (!) 140/93  128/65  Pulse: 93  72  Resp: 18  17  Temp: 98.2 F (36.8 C)    TempSrc: Oral    SpO2: 100%  97%  Weight:  95.3 kg   Height:  5' 9 (1.753 m)    Physical Exam Constitutional:      Appearance: She is obese.  HENT:     Head: Normocephalic.     Nose: Nose normal.     Mouth/Throat:     Mouth: Mucous membranes are moist.  Eyes:     Pupils: Pupils are equal, round, and reactive to light.  Cardiovascular:     Rate and Rhythm: Normal rate and regular rhythm.  Pulmonary:     Effort: Pulmonary effort is normal.  Abdominal:     General: Abdomen is flat. Bowel sounds are normal.  Musculoskeletal:        General: Normal range of motion.     Cervical back: Normal range of motion.  Skin:    General: Skin is warm.  Neurological:     General: No focal deficit present.  Psychiatric:        Mood and Affect: Mood normal.     Data Reviewed:  There are no new results to review at this time.  CT ABDOMEN PELVIS W CONTRAST CLINICAL DATA:  Cough and shortness of breath.  Nausea and vomiting.  EXAM: CT ANGIOGRAPHY CHEST  CT ABDOMEN AND PELVIS WITH  CONTRAST  TECHNIQUE: Multidetector CT imaging of the chest was performed using the standard protocol during bolus administration of intravenous contrast. Multiplanar CT image reconstructions and MIPs were obtained to evaluate the vascular anatomy. Multidetector CT imaging of the abdomen and pelvis was performed using the standard protocol during bolus administration of intravenous contrast.  RADIATION DOSE REDUCTION: This exam was performed according to the departmental dose-optimization program which includes automated exposure control, adjustment of the mA and/or kV according to patient size and/or use of iterative reconstruction technique.  CONTRAST:  OMNIPAQUE  IOHEXOL  350 MG/ML SOLN  COMPARISON:  Chest x-ray from same day. CT abdomen pelvis dated April 30, 2020. CT chest dated July 23, 2019.  FINDINGS: CTA CHEST FINDINGS  Cardiovascular: Satisfactory opacification of the pulmonary arteries to the segmental level. No evidence of pulmonary embolism. Normal heart size. No pericardial effusion. No thoracic aortic aneurysm or dissection.  Mediastinum/Nodes: No enlarged mediastinal, hilar, or axillary lymph nodes. Prominent subcentimeter hilar lymph nodes are likely reactive. Thyroid gland, trachea, and esophagus demonstrate no significant findings.  Lungs/Pleura: Diffuse peribronchial thickening. Subtle subpleural ground-glass density in the anterior right upper lobe. Small ill-defined opacity in the central left lower lobe. Mild pleuroparenchymal scarring in the anterior and posterior right lower lobe. No focal consolidation, pleural effusion, or pneumothorax.  Musculoskeletal: No chest wall abnormality. No acute or significant osseous findings.  Review of the MIP images confirms the above findings.  CT ABDOMEN PELVIS FINDINGS  Hepatobiliary: No focal liver abnormality is seen. Status post cholecystectomy. No biliary dilatation.  Pancreas: Unremarkable. No  pancreatic ductal dilatation or surrounding inflammatory changes.  Spleen: Normal in size without focal abnormality.  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 2.1 x 1.5 cm and 1.3 x 1.8 cm right adrenal myelolipomas, also increased in size since the prior study. Multiple tiny left renal calculi, increased in number since the prior study. Multiple tiny right renal calculi, similar in  number to the prior study. Unchanged bilateral renal cortical scarring. No ureteral calculi hydronephrosis. The bladder is unremarkable for the degree of distention.  Stomach/Bowel: Stomach is within normal limits. Appendix appears normal. No evidence of bowel wall thickening, distention, or inflammatory changes.  Vascular/Lymphatic: No significant vascular findings are present. No enlarged abdominal or pelvic lymph nodes.  Reproductive: Slight interval increase in size of a 3.0 cm exophytic fibroid arising from the anterior fundus. No adnexal mass.  Other: No abdominal wall hernia or abnormality. No abdominopelvic ascites. No pneumoperitoneum.  Musculoskeletal: No acute or significant osseous findings.  Review of the MIP images confirms the above findings.  IMPRESSION: CT chest:  1. No evidence of pulmonary embolism. 2. Diffuse peribronchial thickening with subtle subpleural ground-glass density in the anterior right upper lobe and small ill-defined opacity in the central left lower lobe, likely infectious/inflammatory.  CT abdomen and pelvis:  1. No acute intra-abdominal process. 2. Bilateral nonobstructive nephrolithiasis. 3. Bilateral adrenal myelolipomas, increased in size since the prior study. No follow-up imaging is recommended.  Electronically Signed   By: Elsie ONEIDA Shoulder M.D.   On: 09/30/2023 14:03 CT Angio Chest PE W and/or Wo Contrast CLINICAL DATA:  Cough and shortness of breath.  Nausea and  vomiting.  EXAM: CT ANGIOGRAPHY CHEST  CT ABDOMEN AND PELVIS WITH CONTRAST  TECHNIQUE: Multidetector CT imaging of the chest was performed using the standard protocol during bolus administration of intravenous contrast. Multiplanar CT image reconstructions and MIPs were obtained to evaluate the vascular anatomy. Multidetector CT imaging of the abdomen and pelvis was performed using the standard protocol during bolus administration of intravenous contrast.  RADIATION DOSE REDUCTION: This exam was performed according to the departmental dose-optimization program which includes automated exposure control, adjustment of the mA and/or kV according to patient size and/or use of iterative reconstruction technique.  CONTRAST:  OMNIPAQUE  IOHEXOL  350 MG/ML SOLN  COMPARISON:  Chest x-ray from same day. CT abdomen pelvis dated April 30, 2020. CT chest dated July 23, 2019.  FINDINGS: CTA CHEST FINDINGS  Cardiovascular: Satisfactory opacification of the pulmonary arteries to the segmental level. No evidence of pulmonary embolism. Normal heart size. No pericardial effusion. No thoracic aortic aneurysm or dissection.  Mediastinum/Nodes: No enlarged mediastinal, hilar, or axillary lymph nodes. Prominent subcentimeter hilar lymph nodes are likely reactive. Thyroid gland, trachea, and esophagus demonstrate no significant findings.  Lungs/Pleura: Diffuse peribronchial thickening. Subtle subpleural ground-glass density in the anterior right upper lobe. Small ill-defined opacity in the central left lower lobe. Mild pleuroparenchymal scarring in the anterior and posterior right lower lobe. No focal consolidation, pleural effusion, or pneumothorax.  Musculoskeletal: No chest wall abnormality. No acute or significant osseous findings.  Review of the MIP images confirms the above findings.  CT ABDOMEN PELVIS FINDINGS  Hepatobiliary: No focal liver abnormality is seen. Status  post cholecystectomy. No biliary dilatation.  Pancreas: Unremarkable. No pancreatic ductal dilatation or surrounding inflammatory changes.  Spleen: Normal in size without focal abnormality.  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 2.1 x 1.5 cm and 1.3 x 1.8 cm right adrenal myelolipomas, also increased in size since the prior study. Multiple tiny left renal calculi, increased in number since the prior study. Multiple tiny right renal calculi, similar in number to the prior study. Unchanged bilateral renal cortical scarring. No ureteral calculi hydronephrosis. The bladder is unremarkable for the degree of distention.  Stomach/Bowel: Stomach is within normal limits. Appendix appears normal. No evidence  of bowel wall thickening, distention, or inflammatory changes.  Vascular/Lymphatic: No significant vascular findings are present. No enlarged abdominal or pelvic lymph nodes.  Reproductive: Slight interval increase in size of a 3.0 cm exophytic fibroid arising from the anterior fundus. No adnexal mass.  Other: No abdominal wall hernia or abnormality. No abdominopelvic ascites. No pneumoperitoneum.  Musculoskeletal: No acute or significant osseous findings.  Review of the MIP images confirms the above findings.  IMPRESSION: CT chest:  1. No evidence of pulmonary embolism. 2. Diffuse peribronchial thickening with subtle subpleural ground-glass density in the anterior right upper lobe and small ill-defined opacity in the central left lower lobe, likely infectious/inflammatory.  CT abdomen and pelvis:  1. No acute intra-abdominal process. 2. Bilateral nonobstructive nephrolithiasis. 3. Bilateral adrenal myelolipomas, increased in size since the prior study. No follow-up imaging is recommended.  Electronically Signed   By: Elsie ONEIDA Shoulder M.D.   On: 09/30/2023 14:03 DG Chest 2 View CLINICAL DATA:  Shortness  of breath.  EXAM: CHEST - 2 VIEW  COMPARISON:  September 22, 2023.  FINDINGS: The heart size and mediastinal contours are within normal limits. Both lungs are clear. The visualized skeletal structures are unremarkable.  IMPRESSION: No active cardiopulmonary disease.  Electronically Signed   By: Lynwood Landy Raddle M.D.   On: 09/30/2023 09:55  Lab Results  Component Value Date   WBC 14.4 (H) 09/30/2023   HGB 13.0 09/30/2023   HCT 40.4 09/30/2023   MCV 83.5 09/30/2023   PLT 309 09/30/2023   Last metabolic panel Lab Results  Component Value Date   GLUCOSE 149 (H) 09/30/2023   NA 145 09/30/2023   K 3.0 (L) 09/30/2023   CL 100 09/30/2023   CO2 24 09/30/2023   BUN 8 09/30/2023   CREATININE 0.95 09/30/2023   GFRNONAA >60 09/30/2023   CALCIUM 9.4 09/30/2023   PROT 8.0 09/30/2023   ALBUMIN 4.0 09/30/2023   BILITOT 1.3 (H) 09/30/2023   ALKPHOS 76 09/30/2023   AST 36 09/30/2023   ALT 18 09/30/2023   ANIONGAP 21 (H) 09/30/2023    Assessment and Plan: * PNA (pneumonia) Worsening cough and shortness of breath x 1 week Noted left lower lobe pneumonia on CT of the chest Failed outpatient management Placed on course of IV Rocephin  azithromycin  for coverage Blood and respiratory cultures Add on steroids in setting of likely subclinical obstructive lung disease No hypoxia present Monitor  Alcohol abuse Denies regular ETOH use at present  Check ETOH level  Monitor   Tobacco use 1/2 PPD smoker  Discussed cessation  Nicotine  patch    History of pulmonary embolus (PE) CTA negative for PE today    Greater than 50% was spent in counseling and coordination of care with patient Total encounter time 80 minutes or more    Advance Care Planning:   Code Status: Full Code   Consults: None   Family Communication: No family at the bedside   Severity of Illness: The appropriate patient status for this patient is INPATIENT. Inpatient status is judged to be reasonable and  necessary in order to provide the required intensity of service to ensure the patient's safety. The patient's presenting symptoms, physical exam findings, and initial radiographic and laboratory data in the context of their chronic comorbidities is felt to place them at high risk for further clinical deterioration. Furthermore, it is not anticipated that the patient will be medically stable for discharge from the hospital within 2 midnights of admission.   * I certify that  at the point of admission it is my clinical judgment that the patient will require inpatient hospital care spanning beyond 2 midnights from the point of admission due to high intensity of service, high risk for further deterioration and high frequency of surveillance required.*  Author: Elspeth JINNY Masters, MD 09/30/2023 3:33 PM  For on call review [email or web address].

## 2023-09-30 NOTE — ED Provider Notes (Signed)
 [facility] Provider Note    Event Date/Time   First MD Initiated Contact with Patient 09/30/23 [id number]     (approximate)   History   Shortness of Breath and Chest Pain   HPI  Katrina Page is a 43 y.o. female with thrombocytopenia, PE, migraines who comes in with concerns for worsening shortness of breath.  Patient reports that she has had worsening shortness of breath even after being compliant with medications.  I reviewed the note where patient was discharged on Zofran , codeine  cough syrup, Keflex , azithromycin .  I discussed with patient and she states that her pulmonary embolism was years ago and she is not sure how it happened.  She states that she is not on any blood thinners.  She reports that she is taking all the medication and she is having continued cough, pain, vomiting.  She states that she is having difficulty tolerating p.o.  She reports some generalized abdominal discomfort as well.  Physical Exam   Triage Vital Signs: ED Triage Vitals  Encounter Vitals Group     BP 09/30/23 0914 (!) 140/93     Systolic BP Percentile --      Diastolic BP Percentile --      Pulse Rate 09/30/23 0914 93     Resp 09/30/23 0914 18     Temp 09/30/23 0914 98.2 F (36.8 C)     Temp Source 09/30/23 0914 Oral     SpO2 09/30/23 0914 100 %     Weight 09/30/23 0915 210 lb (95.3 kg)     Height 09/30/23 0915 5' 9 (1.753 m)     Head Circumference --      Peak Flow --      Pain Score 09/30/23 0914 10     Pain Loc --      Pain Education --      Exclude from Growth Chart --     Most recent vital signs: Vitals:   09/30/23 0914  BP: (!) 140/93  Pulse: 93  Resp: 18  Temp: 98.2 F (36.8 C)  SpO2: 100%     General: Awake, no distress.  Patient is tearful CV:  Good peripheral perfusion.  Resp:  Normal effort.  Abd:  No distention.  Slight generalized tenderness.  Other:  No chest wall tenderness.   ED Results / Procedures / Treatments   Labs (all labs  ordered are listed, but only abnormal results are displayed) Labs Reviewed  CBC - Abnormal; Notable for the following components:      Result Value   WBC 14.4 (*)    RDW 15.9 (*)    All other components within normal limits  BASIC METABOLIC PANEL  POC URINE PREG, ED  TROPONIN I (HIGH SENSITIVITY)     EKG  My interpretation of EKG:  Normal sinus rate of 88 without any ST elevation or T wave inversions, QTc of 445  RADIOLOGY I have reviewed the xray personally and interpreted no evidence of any pneumonia  PROCEDURES:  Critical Care performed: No  Procedures   MEDICATIONS ORDERED IN ED: Medications  HYDROmorphone  (DILAUDID ) injection 0.5 mg (has no administration in time range)  ondansetron  (ZOFRAN ) injection 4 mg (has no administration in time range)  sodium chloride  0.9 % bolus 1,000 mL (has no administration in time range)     IMPRESSION / MDM / ASSESSMENT AND PLAN / ED COURSE  I reviewed the triage vital signs and the nursing notes.   Patient's presentation is most  consistent with acute presentation with potential threat to life or bodily function.   Patient comes in failing outpatient treatment for pneumonia however chest x-ray today does not show any evidence of pneumonia.  Given her history of pulmonary embolism and continued shortness of breath we will get CT imaging evaluate for PE, troponin evaluate for ACS.  She does report some abdominal discomfort as well as not tolerating eating and drinking therefore we will get CT scan to rule out obstruction, perforation or other acute pathology.  Will treat patient symptomatically with IV Dilaudid  IV Zofran  and IV fluids.    Troponins are negative x 2.  Alcohol negative salicylate, Tylenol  negative.  COVID, flu were negative.  LFTs overall reassuring.  Lipase normal   CT imaging does show pneumonia.  Patient failing outpatient therapy.  Discussed with her different options will admit for dehydration, pneumonia.  Patient  meet sepsis criteria blood cultures, lactate ordered broad-spectrum antibiotics started  FINAL CLINICAL IMPRESSION(S) / ED DIAGNOSES   Final diagnoses:  Weakness  Pneumonia due to infectious organism, unspecified laterality, unspecified part of lung     Rx / DC Orders   ED Discharge Orders     None        Note:  This document was prepared using Dragon voice recognition software and may include unintentional dictation errors.   Ernest Ronal BRAVO, MD 09/30/23 [phone]

## 2023-09-30 NOTE — ED Notes (Addendum)
 This RN to bedside to introduce self to pt. Pt is caox4, in no acute distress. This pt has order for CCMD monitoring and pt is not on the monitor. This pt placed pt on monitor and called CCMD. Family at bedside.

## 2023-09-30 NOTE — Assessment & Plan Note (Signed)
 CTA negative for PE today

## 2023-09-30 NOTE — ED Notes (Signed)
 Unsuccesful attempt x2 for 20 g above wrist.

## 2023-10-01 DIAGNOSIS — J189 Pneumonia, unspecified organism: Secondary | ICD-10-CM | POA: Diagnosis not present

## 2023-10-01 DIAGNOSIS — Z720 Tobacco use: Secondary | ICD-10-CM | POA: Diagnosis not present

## 2023-10-01 DIAGNOSIS — Z86711 Personal history of pulmonary embolism: Secondary | ICD-10-CM | POA: Diagnosis not present

## 2023-10-01 DIAGNOSIS — F1010 Alcohol abuse, uncomplicated: Secondary | ICD-10-CM | POA: Diagnosis not present

## 2023-10-01 LAB — COMPREHENSIVE METABOLIC PANEL
ALT: 16 U/L (ref 0–44)
AST: 20 U/L (ref 15–41)
Albumin: 3.4 g/dL — ABNORMAL LOW (ref 3.5–5.0)
Alkaline Phosphatase: 59 U/L (ref 38–126)
Anion gap: 12 (ref 5–15)
BUN: 8 mg/dL (ref 6–20)
CO2: 28 mmol/L (ref 22–32)
Calcium: 8.5 mg/dL — ABNORMAL LOW (ref 8.9–10.3)
Chloride: 99 mmol/L (ref 98–111)
Creatinine, Ser: 0.86 mg/dL (ref 0.44–1.00)
GFR, Estimated: >60 mL/min (ref >60)
Glucose, Bld: 146 mg/dL — ABNORMAL HIGH (ref 70–99)
Potassium: 2.9 mmol/L — ABNORMAL LOW (ref 3.5–5.1)
Sodium: 139 mmol/L (ref 135–145)
Total Bilirubin: 0.8 mg/dL (ref 0.0–1.2)
Total Protein: 6.9 g/dL (ref 6.5–8.1)

## 2023-10-01 LAB — CBC
HCT: 36.3 % (ref 36.0–46.0)
Hemoglobin: 11.4 g/dL — ABNORMAL LOW (ref 12.0–15.0)
MCH: 27 pg (ref 26.0–34.0)
MCHC: 31.4 g/dL (ref 30.0–36.0)
MCV: 85.8 fL (ref 80.0–100.0)
Platelets: 247 10*3/uL (ref 150–400)
RBC: 4.23 MIL/uL (ref 3.87–5.11)
RDW: 15.9 % — ABNORMAL HIGH (ref 11.5–15.5)
WBC: 10 10*3/uL (ref 4.0–10.5)
nRBC: 0 % (ref 0.0–0.2)

## 2023-10-01 LAB — LACTIC ACID, PLASMA
Lactic Acid, Venous: 0.9 mmol/L (ref 0.5–1.9)
Lactic Acid, Venous: 0.8 mmol/L (ref 0.5–1.9)

## 2023-10-01 MED ORDER — POTASSIUM CHLORIDE 20 MEQ PO PACK
40.0000 meq | PACK | Freq: Once | ORAL | Status: AC
Start: 2023-10-01 — End: 2023-10-01
  Administered 2023-10-01: 40 meq via ORAL
  Filled 2023-10-01 (×2): qty 2

## 2023-10-01 MED ORDER — MELATONIN 5 MG PO TABS
5.0000 mg | ORAL_TABLET | Freq: Every evening | ORAL | Status: DC | PRN
  Administered 2023-10-01 – 2023-10-02 (×2): 5 mg via ORAL
  Filled 2023-10-01 (×3): qty 1

## 2023-10-01 NOTE — Progress Notes (Addendum)
 Progress Note   Patient: Katrina Page FMW:[id number] DOB: [birth year] DOA: 09/30/2023     1 DOS: the patient was seen and examined on 10/01/2023   Brief hospital course: Katrina Page is a 43 y.o. female with medical history significant of obesity, history of PE, tobacco abuse, alcohol use presenting with pneumonia.  Patient reports cough since roughly New Year's Eve.  Was evaluated in the ER on December 31 and diagnosed with pneumonia.  Was placed on course of Keflex  as well as azithromycin .  Patient reports persistent symptoms despite treatment.   Patient is admitted to the hospitalist service for further management evaluation of pneumonia.  Assessment and Plan: * PNA (pneumonia) Worsening cough and shortness of breath x 1 week Noted left lower lobe pneumonia on CT of the chest Will continue IV Rocephin  azithromycin  for coverage as she failed outpatient oral antibiotics. 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  Alcohol abuse Denies regular ETOH use at present  Monitor for withdrawal symptoms.  Tobacco use 1/2 PPD smoker  Discussed cessation.  She is motivated to stop. Nicotine  patch   History of pulmonary embolus (PE) CTA negative for PE today   Obesity with BMI 31.01: Diet exercise and weight reduction advised.  Hypokalemia: Due to poor oral intake. Oral and IV potassium supplements ordered. Follow repeat BMP.  Out of bed to chair. Incentive spirometry. Nursing supportive care. Fall, aspiration precautions. DVT prophylaxis   Code Status: Full Code  Subjective: Patient is seen and examined today morning.  Patient feels nauseous, has a dry cough.  Feels weak, eating poor.  Did not get out of bed.  Physical Exam: Vitals:   10/01/23 1032 10/01/23 1148 10/01/23 1155 10/01/23 1624  BP: (!) 147/58 (!) 170/79 (!) 147/73 137/83  Pulse: (!) 57 61  80  Resp: 17 18  18    Temp: 98.4 F (36.9 C) 98.4 F (36.9 C)  98.3 F (36.8 C)  TempSrc: Oral Oral  Oral  SpO2: 100% 97%  98%  Weight:      Height:        General - Young obese American female, in distress due to nausea HEENT - PERRLA, EOMI, atraumatic head, non tender sinuses. Lung - Clear, basilar rales, rhonchi, no wheezes. Heart - S1, S2 heard, no murmurs, rubs, trace pedal edema. Abdomen - Soft, non tender, nondistended, bowel sounds good Neuro - Alert, awake and oriented x 3, non focal exam. Skin - Warm and dry.  Data Reviewed:      Latest Ref Rng & Units 10/01/2023    4:30 AM 09/30/2023    9:21 AM 09/22/2023    7:45 PM  CBC  WBC 4.0 - 10.5 K/uL 10.0  14.4  15.3   Hemoglobin 12.0 - 15.0 g/dL 88.5  86.9  87.1   Hematocrit 36.0 - 46.0 % 36.3  40.4  41.0   Platelets 150 - 400 K/uL 247  309  372       Latest Ref Rng & Units 10/01/2023    4:30 AM 09/30/2023    9:21 AM 09/22/2023    7:45 PM  BMP  Glucose 70 - 99 mg/dL 853  850  884   BUN 6 - 20 mg/dL 8  8  7    Creatinine 0.44 - 1.00 mg/dL 9.13  9.04  9.01   Sodium 135 - 145 mmol/L 139  145  138   Potassium 3.5 - 5.1  mmol/L 2.9  3.0  3.2   Chloride 98 - 111 mmol/L 99  100  100   CO2 22 - 32 mmol/L 28  24  22    Calcium 8.9 - 10.3 mg/dL 8.5  9.4  9.3    CT Angio Chest PE W and/or Wo Contrast Result Date: 09/30/2023 CLINICAL DATA:  Cough and shortness of breath.  Nausea and vomiting. EXAM: CT ANGIOGRAPHY CHEST CT ABDOMEN AND PELVIS WITH CONTRAST TECHNIQUE: Multidetector CT imaging of the chest was performed using the standard protocol during bolus administration of intravenous contrast. Multiplanar CT image reconstructions and MIPs were obtained to evaluate the vascular anatomy. Multidetector CT imaging of the abdomen and pelvis was performed using the standard protocol during bolus administration of intravenous contrast. RADIATION DOSE REDUCTION: This exam was performed according to the departmental dose-optimization program which includes automated  exposure control, adjustment of the mA and/or kV according to patient size and/or use of iterative reconstruction technique. CONTRAST:  OMNIPAQUE  IOHEXOL  350 MG/ML SOLN COMPARISON:  Chest x-ray from same day. CT abdomen pelvis dated April 30, 2020. CT chest dated July 23, 2019. FINDINGS: CTA CHEST FINDINGS Cardiovascular: Satisfactory opacification of the pulmonary arteries to the segmental level. No evidence of pulmonary embolism. Normal heart size. No pericardial effusion. No thoracic aortic aneurysm or dissection. Mediastinum/Nodes: No enlarged mediastinal, hilar, or axillary lymph nodes. Prominent subcentimeter hilar lymph nodes are likely reactive. Thyroid gland, trachea, and esophagus demonstrate no significant findings. Lungs/Pleura: Diffuse peribronchial thickening. Subtle subpleural ground-glass density in the anterior right upper lobe. Small ill-defined opacity in the central left lower lobe. Mild pleuroparenchymal scarring in the anterior and posterior right lower lobe. No focal consolidation, pleural effusion, or pneumothorax. Musculoskeletal: No chest wall abnormality. No acute or significant osseous findings. Review of the MIP images confirms the above findings. CT ABDOMEN PELVIS FINDINGS Hepatobiliary: No focal liver abnormality is seen. Status post cholecystectomy. No biliary dilatation. Pancreas: Unremarkable. No pancreatic ductal dilatation or surrounding inflammatory changes. Spleen: Normal in size without focal abnormality. 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 2.1 x 1.5 cm and 1.3 x 1.8 cm right adrenal myelolipomas, also increased in size since the prior study. Multiple tiny left renal calculi, increased in number since the prior study. Multiple tiny right renal calculi, similar in number to the prior study. Unchanged bilateral renal cortical scarring. No ureteral calculi hydronephrosis. The bladder is unremarkable for  the degree of distention. Stomach/Bowel: Stomach is within normal limits. Appendix appears normal. No evidence of bowel wall thickening, distention, or inflammatory changes. Vascular/Lymphatic: No significant vascular findings are present. No enlarged abdominal or pelvic lymph nodes. Reproductive: Slight interval increase in size of a 3.0 cm exophytic fibroid arising from the anterior fundus. No adnexal mass. Other: No abdominal wall hernia or abnormality. No abdominopelvic ascites. No pneumoperitoneum. Musculoskeletal: No acute or significant osseous findings. Review of the MIP images confirms the above findings. IMPRESSION: CT chest: 1. No evidence of pulmonary embolism. 2. Diffuse peribronchial thickening with subtle subpleural ground-glass density in the anterior right upper lobe and small ill-defined opacity in the central left lower lobe, likely infectious/inflammatory. CT abdomen and pelvis: 1. No acute intra-abdominal process. 2. Bilateral nonobstructive nephrolithiasis. 3. Bilateral adrenal myelolipomas, increased in size since the prior study. No follow-up imaging is recommended. Electronically Signed   By: Elsie ONEIDA Shoulder M.D.   On: 09/30/2023 14:03   CT ABDOMEN PELVIS W CONTRAST Result Date: 09/30/2023 CLINICAL DATA:  Cough and  shortness of breath.  Nausea and vomiting. EXAM: CT ANGIOGRAPHY CHEST CT ABDOMEN AND PELVIS WITH CONTRAST TECHNIQUE: Multidetector CT imaging of the chest was performed using the standard protocol during bolus administration of intravenous contrast. Multiplanar CT image reconstructions and MIPs were obtained to evaluate the vascular anatomy. Multidetector CT imaging of the abdomen and pelvis was performed using the standard protocol during bolus administration of intravenous contrast. RADIATION DOSE REDUCTION: This exam was performed according to the departmental dose-optimization program which includes automated exposure control, adjustment of the mA and/or kV according to  patient size and/or use of iterative reconstruction technique. CONTRAST:  OMNIPAQUE  IOHEXOL  350 MG/ML SOLN COMPARISON:  Chest x-ray from same day. CT abdomen pelvis dated April 30, 2020. CT chest dated July 23, 2019. FINDINGS: CTA CHEST FINDINGS Cardiovascular: Satisfactory opacification of the pulmonary arteries to the segmental level. No evidence of pulmonary embolism. Normal heart size. No pericardial effusion. No thoracic aortic aneurysm or dissection. Mediastinum/Nodes: No enlarged mediastinal, hilar, or axillary lymph nodes. Prominent subcentimeter hilar lymph nodes are likely reactive. Thyroid gland, trachea, and esophagus demonstrate no significant findings. Lungs/Pleura: Diffuse peribronchial thickening. Subtle subpleural ground-glass density in the anterior right upper lobe. Small ill-defined opacity in the central left lower lobe. Mild pleuroparenchymal scarring in the anterior and posterior right lower lobe. No focal consolidation, pleural effusion, or pneumothorax. Musculoskeletal: No chest wall abnormality. No acute or significant osseous findings. Review of the MIP images confirms the above findings. CT ABDOMEN PELVIS FINDINGS Hepatobiliary: No focal liver abnormality is seen. Status post cholecystectomy. No biliary dilatation. Pancreas: Unremarkable. No pancreatic ductal dilatation or surrounding inflammatory changes. Spleen: Normal in size without focal abnormality. 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 2.1 x 1.5 cm and 1.3 x 1.8 cm right adrenal myelolipomas, also increased in size since the prior study. Multiple tiny left renal calculi, increased in number since the prior study. Multiple tiny right renal calculi, similar in number to the prior study. Unchanged bilateral renal cortical scarring. No ureteral calculi hydronephrosis. The bladder is unremarkable for the degree of distention. Stomach/Bowel: Stomach is within  normal limits. Appendix appears normal. No evidence of bowel wall thickening, distention, or inflammatory changes. Vascular/Lymphatic: No significant vascular findings are present. No enlarged abdominal or pelvic lymph nodes. Reproductive: Slight interval increase in size of a 3.0 cm exophytic fibroid arising from the anterior fundus. No adnexal mass. Other: No abdominal wall hernia or abnormality. No abdominopelvic ascites. No pneumoperitoneum. Musculoskeletal: No acute or significant osseous findings. Review of the MIP images confirms the above findings. IMPRESSION: CT chest: 1. No evidence of pulmonary embolism. 2. Diffuse peribronchial thickening with subtle subpleural ground-glass density in the anterior right upper lobe and small ill-defined opacity in the central left lower lobe, likely infectious/inflammatory. CT abdomen and pelvis: 1. No acute intra-abdominal process. 2. Bilateral nonobstructive nephrolithiasis. 3. Bilateral adrenal myelolipomas, increased in size since the prior study. No follow-up imaging is recommended. Electronically Signed   By: Elsie ONEIDA Shoulder M.D.   On: 09/30/2023 14:03   DG Chest 2 View Result Date: 09/30/2023 CLINICAL DATA:  Shortness of breath. EXAM: CHEST - 2 VIEW COMPARISON:  September 22, 2023. FINDINGS: The heart size and mediastinal contours are within normal limits. Both lungs are clear. The visualized skeletal structures are unremarkable. IMPRESSION: No active cardiopulmonary disease. Electronically Signed   By: Lynwood Landy Raddle M.D.   On: 09/30/2023 09:55     Family Communication: Discussed with patient, she  understand and agree. All questions answereed.    Disposition: Status is: Inpatient Remains inpatient appropriate because: IV antibiotics, antiemetics  Planned Discharge Destination: Home     Time spent: 37 minutes  Author: Concepcion Riser, MD 10/01/2023 4:38 PM Secure chat 7am to 7pm For on call review [email or web address].

## 2023-10-01 NOTE — Plan of Care (Signed)
  Problem: Activity: Goal: Ability to tolerate increased activity will improve Outcome: Progressing   Problem: Clinical Measurements: Goal: Ability to maintain a body temperature in the normal range will improve Outcome: Progressing   Problem: Respiratory: Goal: Ability to maintain adequate ventilation will improve Outcome: Progressing Goal: Ability to maintain a clear airway will improve Outcome: Progressing   Problem: Education: Goal: Knowledge of General Education information will improve Description: Including pain rating scale, medication(s)/side effects and non-pharmacologic comfort measures Outcome: Progressing   Problem: Health Behavior/Discharge Planning: Goal: Ability to manage health-related needs will improve Outcome: Progressing   Problem: Clinical Measurements: Goal: Ability to maintain clinical measurements within normal limits will improve Outcome: Progressing Goal: Will remain free from infection Outcome: Progressing Goal: Diagnostic test results will improve Outcome: Progressing Goal: Respiratory complications will improve Outcome: Progressing Goal: Cardiovascular complication will be avoided Outcome: Progressing

## 2023-10-02 DIAGNOSIS — E876 Hypokalemia: Secondary | ICD-10-CM

## 2023-10-02 DIAGNOSIS — F1910 Other psychoactive substance abuse, uncomplicated: Secondary | ICD-10-CM

## 2023-10-02 DIAGNOSIS — E6609 Other obesity due to excess calories: Secondary | ICD-10-CM

## 2023-10-02 DIAGNOSIS — J189 Pneumonia, unspecified organism: Secondary | ICD-10-CM | POA: Diagnosis not present

## 2023-10-02 DIAGNOSIS — E66811 Obesity, class 1: Secondary | ICD-10-CM | POA: Diagnosis not present

## 2023-10-02 DIAGNOSIS — Z86711 Personal history of pulmonary embolism: Secondary | ICD-10-CM

## 2023-10-02 DIAGNOSIS — Z6831 Body mass index (BMI) 31.0-31.9, adult: Secondary | ICD-10-CM

## 2023-10-02 LAB — CBC
HCT: 34.4 % — ABNORMAL LOW (ref 36.0–46.0)
Hemoglobin: 10.5 g/dL — ABNORMAL LOW (ref 12.0–15.0)
MCH: 26.3 pg (ref 26.0–34.0)
MCHC: 30.5 g/dL (ref 30.0–36.0)
MCV: 86.2 fL (ref 80.0–100.0)
Platelets: 222 10*3/uL (ref 150–400)
RBC: 3.99 MIL/uL (ref 3.87–5.11)
RDW: 16.2 % — ABNORMAL HIGH (ref 11.5–15.5)
WBC: 10.7 10*3/uL — ABNORMAL HIGH (ref 4.0–10.5)
nRBC: 0 % (ref 0.0–0.2)

## 2023-10-02 LAB — LEGIONELLA PNEUMOPHILA SEROGP 1 UR AG: L. pneumophila Serogp 1 Ur Ag: NEGATIVE

## 2023-10-02 LAB — BASIC METABOLIC PANEL
Anion gap: 11 (ref 5–15)
BUN: 10 mg/dL (ref 6–20)
CO2: 27 mmol/L (ref 22–32)
Calcium: 8.5 mg/dL — ABNORMAL LOW (ref 8.9–10.3)
Chloride: 104 mmol/L (ref 98–111)
Creatinine, Ser: 1.04 mg/dL — ABNORMAL HIGH (ref 0.44–1.00)
GFR, Estimated: >60 mL/min (ref >60)
Glucose, Bld: 97 mg/dL (ref 70–99)
Potassium: 2.8 mmol/L — ABNORMAL LOW (ref 3.5–5.1)
Sodium: 142 mmol/L (ref 135–145)

## 2023-10-02 MED ORDER — POTASSIUM CHLORIDE 20 MEQ PO PACK
40.0000 meq | PACK | Freq: Two times a day (BID) | ORAL | Status: DC
  Administered 2023-10-02: 40 meq via ORAL
  Filled 2023-10-02 (×2): qty 2

## 2023-10-02 MED ORDER — PREDNISONE 10 MG PO TABS
20.0000 mg | ORAL_TABLET | Freq: Every day | ORAL | Status: DC
  Administered 2023-10-03: 20 mg via ORAL
  Filled 2023-10-02: qty 2

## 2023-10-02 MED ORDER — POTASSIUM CHLORIDE CRYS ER 20 MEQ PO TBCR
40.0000 meq | EXTENDED_RELEASE_TABLET | Freq: Two times a day (BID) | ORAL | Status: DC
  Administered 2023-10-02 – 2023-10-03 (×2): 40 meq via ORAL
  Filled 2023-10-02 (×2): qty 2

## 2023-10-02 MED ORDER — OXYCODONE HCL 5 MG PO TABS
5.0000 mg | ORAL_TABLET | ORAL | Status: DC | PRN
  Administered 2023-10-02 – 2023-10-03 (×5): 5 mg via ORAL
  Filled 2023-10-02 (×5): qty 1

## 2023-10-02 MED ORDER — POTASSIUM CHLORIDE 10 MEQ/100ML IV SOLN
10.0000 meq | INTRAVENOUS | Status: AC
  Administered 2023-10-02 (×2): 10 meq via INTRAVENOUS
  Filled 2023-10-02 (×2): qty 100

## 2023-10-02 MED ORDER — SODIUM CHLORIDE 0.9 % IV SOLN: INTRAVENOUS | Status: DC

## 2023-10-02 MED ORDER — GUAIFENESIN-DM 100-10 MG/5ML PO SYRP
5.0000 mL | ORAL_SOLUTION | ORAL | Status: DC | PRN
  Administered 2023-10-02 – 2023-10-03 (×4): 5 mL via ORAL
  Filled 2023-10-02 (×6): qty 5

## 2023-10-02 MED ORDER — NICOTINE 21 MG/24HR TD PT24
21.0000 mg | MEDICATED_PATCH | Freq: Every day | TRANSDERMAL | Status: DC
Start: 2023-10-02 — End: 2023-10-03
  Administered 2023-10-02: 21 mg via TRANSDERMAL
  Filled 2023-10-02 (×2): qty 1

## 2023-10-02 NOTE — Progress Notes (Signed)
 Progress Note   Patient: Katrina Page FMW:[id number] DOB: 1981/04/19 DOA: 09/30/2023     2 DOS: the patient was seen and examined on 10/02/2023   Brief hospital course: Katrina Page is a 43 y.o. female with medical history significant of obesity, history of PE, tobacco abuse, alcohol use presenting with pneumonia.  Patient reports cough since roughly New Year's Eve.  Was evaluated in the ER on December 31 and diagnosed with pneumonia.  Was placed on course of Keflex  as well as azithromycin .  Patient reports persistent symptoms despite treatment.   Patient is admitted to the hospitalist service for further management evaluation of pneumonia. Utox positive for Cannabis, opiates.  Assessment and Plan: * PNA (pneumonia) Worsening cough and shortness of breath x 1 week Noted left lower lobe pneumonia on CT of the chest. She failed outpatient oral antibiotics. Respiratory panel, covid negative. Strep pneumo ag negative. Continue IV Rocephin  azithromycin  for coverage  Follow blood and respiratory cultures. Taper steroids.  Polysubstance abuse Denies regular ETOH use at present  Monitor for withdrawal symptoms. 1/2 PPD smoker, Urine drug screen positive for cannabis. Discussed cessation.  She is motivated to stop. Nicotine  patch   History of pulmonary embolus (PE) CTA negative for PE today. Lovenox  for DVT prophylaxis.  Obesity with BMI 31.01: Diet exercise and weight reduction advised.  Hypokalemia: Due to poor oral intake. K 2,8 Oral and IV potassium supplements ordered. Follow repeat BMP.  Pain control, antitussives ordered. Out of bed to chair. Incentive spirometry. Nursing supportive care. Fall, aspiration precautions.   Code Status: Full Code  Subjective: Patient is seen and examined today morning.  Patient has nauseous, states unable to tolerate diet. Did have similar presentation before. Feels weak, has cough, back pain.    Physical Exam: Vitals:   10/01/23 2001  10/01/23 2300 10/02/23 0354 10/02/23 0809  BP: (!) 143/77 (!) 146/86 (!) 140/85 (!) 163/89  Pulse: 65 66 (!) 53 63  Resp: 16 20 18 20   Temp: 98.6 F (37 C) 98.6 F (37 C) 98.2 F (36.8 C) 98.4 F (36.9 C)  TempSrc:   Oral Oral  SpO2: 97% 98% 95% 96%  Weight:      Height:        General - Young obese Caucasian ill female, in distress due to nausea, pain HEENT - PERRLA, EOMI, atraumatic head, non tender sinuses. Lung - Clear, basilar rales, rhonchi, no wheezes. Heart - S1, S2 heard, no murmurs, rubs, trace pedal edema. Abdomen - Soft, non tender, nondistended, bowel sounds good Neuro - Alert, awake and oriented x 3, non focal exam. Skin - Warm and dry.  Data Reviewed:      Latest Ref Rng & Units 10/02/2023    7:46 AM 10/01/2023    4:30 AM 09/30/2023    9:21 AM  CBC  WBC 4.0 - 10.5 K/uL 10.7  10.0  14.4   Hemoglobin 12.0 - 15.0 g/dL 89.4  88.5  86.9   Hematocrit 36.0 - 46.0 % 34.4  36.3  40.4   Platelets 150 - 400 K/uL 222  247  309       Latest Ref Rng & Units 10/02/2023    7:46 AM 10/01/2023    4:30 AM 09/30/2023    9:21 AM  BMP  Glucose 70 - 99 mg/dL 97  853  850   BUN 6 - 20 mg/dL 10  8  8    Creatinine 0.44 - 1.00 mg/dL 8.95  9.13  9.04   Sodium  135 - 145 mmol/L 142  139  145   Potassium 3.5 - 5.1 mmol/L 2.8  2.9  3.0   Chloride 98 - 111 mmol/L 104  99  100   CO2 22 - 32 mmol/L 27  28  24    Calcium 8.9 - 10.3 mg/dL 8.5  8.5  9.4    CT Angio Chest PE W and/or Wo Contrast Result Date: 09/30/2023 CLINICAL DATA:  Cough and shortness of breath.  Nausea and vomiting. EXAM: CT ANGIOGRAPHY CHEST CT ABDOMEN AND PELVIS WITH CONTRAST TECHNIQUE: Multidetector CT imaging of the chest was performed using the standard protocol during bolus administration of intravenous contrast. Multiplanar CT image reconstructions and MIPs were obtained to evaluate the vascular anatomy. Multidetector CT imaging of the abdomen and pelvis was performed using the standard protocol during bolus  administration of intravenous contrast. RADIATION DOSE REDUCTION: This exam was performed according to the departmental dose-optimization program which includes automated exposure control, adjustment of the mA and/or kV according to patient size and/or use of iterative reconstruction technique. CONTRAST:  OMNIPAQUE  IOHEXOL  350 MG/ML SOLN COMPARISON:  Chest x-ray from same day. CT abdomen pelvis dated April 30, 2020. CT chest dated July 23, 2019. FINDINGS: CTA CHEST FINDINGS Cardiovascular: Satisfactory opacification of the pulmonary arteries to the segmental level. No evidence of pulmonary embolism. Normal heart size. No pericardial effusion. No thoracic aortic aneurysm or dissection. Mediastinum/Nodes: No enlarged mediastinal, hilar, or axillary lymph nodes. Prominent subcentimeter hilar lymph nodes are likely reactive. Thyroid gland, trachea, and esophagus demonstrate no significant findings. Lungs/Pleura: Diffuse peribronchial thickening. Subtle subpleural ground-glass density in the anterior right upper lobe. Small ill-defined opacity in the central left lower lobe. Mild pleuroparenchymal scarring in the anterior and posterior right lower lobe. No focal consolidation, pleural effusion, or pneumothorax. Musculoskeletal: No chest wall abnormality. No acute or significant osseous findings. Review of the MIP images confirms the above findings. CT ABDOMEN PELVIS FINDINGS Hepatobiliary: No focal liver abnormality is seen. Status post cholecystectomy. No biliary dilatation. Pancreas: Unremarkable. No pancreatic ductal dilatation or surrounding inflammatory changes. Spleen: Normal in size without focal abnormality. 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 2.1 x 1.5 cm and 1.3 x 1.8 cm right adrenal myelolipomas, also increased in size since the prior study. Multiple tiny left renal calculi, increased in number since the prior study. Multiple tiny  right renal calculi, similar in number to the prior study. Unchanged bilateral renal cortical scarring. No ureteral calculi hydronephrosis. The bladder is unremarkable for the degree of distention. Stomach/Bowel: Stomach is within normal limits. Appendix appears normal. No evidence of bowel wall thickening, distention, or inflammatory changes. Vascular/Lymphatic: No significant vascular findings are present. No enlarged abdominal or pelvic lymph nodes. Reproductive: Slight interval increase in size of a 3.0 cm exophytic fibroid arising from the anterior fundus. No adnexal mass. Other: No abdominal wall hernia or abnormality. No abdominopelvic ascites. No pneumoperitoneum. Musculoskeletal: No acute or significant osseous findings. Review of the MIP images confirms the above findings. IMPRESSION: CT chest: 1. No evidence of pulmonary embolism. 2. Diffuse peribronchial thickening with subtle subpleural ground-glass density in the anterior right upper lobe and small ill-defined opacity in the central left lower lobe, likely infectious/inflammatory. CT abdomen and pelvis: 1. No acute intra-abdominal process. 2. Bilateral nonobstructive nephrolithiasis. 3. Bilateral adrenal myelolipomas, increased in size since the prior study. No follow-up imaging is recommended. Electronically Signed   By: Elsie ONEIDA Shoulder M.D.   On: 09/30/2023 14:03  CT ABDOMEN PELVIS W CONTRAST Result Date: 09/30/2023 CLINICAL DATA:  Cough and shortness of breath.  Nausea and vomiting. EXAM: CT ANGIOGRAPHY CHEST CT ABDOMEN AND PELVIS WITH CONTRAST TECHNIQUE: Multidetector CT imaging of the chest was performed using the standard protocol during bolus administration of intravenous contrast. Multiplanar CT image reconstructions and MIPs were obtained to evaluate the vascular anatomy. Multidetector CT imaging of the abdomen and pelvis was performed using the standard protocol during bolus administration of intravenous contrast. RADIATION DOSE REDUCTION:  This exam was performed according to the departmental dose-optimization program which includes automated exposure control, adjustment of the mA and/or kV according to patient size and/or use of iterative reconstruction technique. CONTRAST:  OMNIPAQUE  IOHEXOL  350 MG/ML SOLN COMPARISON:  Chest x-ray from same day. CT abdomen pelvis dated April 30, 2020. CT chest dated July 23, 2019. FINDINGS: CTA CHEST FINDINGS Cardiovascular: Satisfactory opacification of the pulmonary arteries to the segmental level. No evidence of pulmonary embolism. Normal heart size. No pericardial effusion. No thoracic aortic aneurysm or dissection. Mediastinum/Nodes: No enlarged mediastinal, hilar, or axillary lymph nodes. Prominent subcentimeter hilar lymph nodes are likely reactive. Thyroid gland, trachea, and esophagus demonstrate no significant findings. Lungs/Pleura: Diffuse peribronchial thickening. Subtle subpleural ground-glass density in the anterior right upper lobe. Small ill-defined opacity in the central left lower lobe. Mild pleuroparenchymal scarring in the anterior and posterior right lower lobe. No focal consolidation, pleural effusion, or pneumothorax. Musculoskeletal: No chest wall abnormality. No acute or significant osseous findings. Review of the MIP images confirms the above findings. CT ABDOMEN PELVIS FINDINGS Hepatobiliary: No focal liver abnormality is seen. Status post cholecystectomy. No biliary dilatation. Pancreas: Unremarkable. No pancreatic ductal dilatation or surrounding inflammatory changes. Spleen: Normal in size without focal abnormality. 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 2.1 x 1.5 cm and 1.3 x 1.8 cm right adrenal myelolipomas, also increased in size since the prior study. Multiple tiny left renal calculi, increased in number since the prior study. Multiple tiny right renal calculi, similar in number to the prior study. Unchanged  bilateral renal cortical scarring. No ureteral calculi hydronephrosis. The bladder is unremarkable for the degree of distention. Stomach/Bowel: Stomach is within normal limits. Appendix appears normal. No evidence of bowel wall thickening, distention, or inflammatory changes. Vascular/Lymphatic: No significant vascular findings are present. No enlarged abdominal or pelvic lymph nodes. Reproductive: Slight interval increase in size of a 3.0 cm exophytic fibroid arising from the anterior fundus. No adnexal mass. Other: No abdominal wall hernia or abnormality. No abdominopelvic ascites. No pneumoperitoneum. Musculoskeletal: No acute or significant osseous findings. Review of the MIP images confirms the above findings. IMPRESSION: CT chest: 1. No evidence of pulmonary embolism. 2. Diffuse peribronchial thickening with subtle subpleural ground-glass density in the anterior right upper lobe and small ill-defined opacity in the central left lower lobe, likely infectious/inflammatory. CT abdomen and pelvis: 1. No acute intra-abdominal process. 2. Bilateral nonobstructive nephrolithiasis. 3. Bilateral adrenal myelolipomas, increased in size since the prior study. No follow-up imaging is recommended. Electronically Signed   By: Elsie ONEIDA Shoulder M.D.   On: 09/30/2023 14:03   Family Communication: Discussed with patient, she understand and agree. All questions answereed.  Disposition: Status is: Inpatient Remains inpatient appropriate because: IV antibiotics, antiemetics, pain control  Planned Discharge Destination: Home     Time spent: 38 minutes  Author: Concepcion Riser, MD 10/02/2023 11:19 AM Secure chat 7am to 7pm For on call review [email or web address].

## 2023-10-03 DIAGNOSIS — Z86711 Personal history of pulmonary embolism: Secondary | ICD-10-CM | POA: Diagnosis not present

## 2023-10-03 DIAGNOSIS — E876 Hypokalemia: Secondary | ICD-10-CM | POA: Diagnosis not present

## 2023-10-03 DIAGNOSIS — F1010 Alcohol abuse, uncomplicated: Secondary | ICD-10-CM | POA: Diagnosis not present

## 2023-10-03 DIAGNOSIS — J189 Pneumonia, unspecified organism: Secondary | ICD-10-CM | POA: Diagnosis not present

## 2023-10-03 MED ORDER — FOLIC ACID-VIT B6-VIT B12 0.4-50-0.1 MG PO TABS: 1.0000 | ORAL_TABLET | Freq: Every day | ORAL | 2 refills | Status: AC

## 2023-10-03 MED ORDER — ONDANSETRON 4 MG PO TBDP: 4.0000 mg | ORAL_TABLET | Freq: Three times a day (TID) | ORAL | 0 refills | Status: AC | PRN

## 2023-10-03 MED ORDER — AMOXICILLIN-POT CLAVULANATE 875-125 MG PO TABS: 1.0000 | ORAL_TABLET | Freq: Two times a day (BID) | ORAL | 0 refills | Status: AC

## 2023-10-03 MED ORDER — METHOCARBAMOL 500 MG PO TABS
500.0000 mg | ORAL_TABLET | Freq: Once | ORAL | Status: AC
  Administered 2023-10-03: 500 mg via ORAL
  Filled 2023-10-03: qty 1

## 2023-10-03 MED ORDER — NICOTINE 21 MG/24HR TD PT24: 21.0000 mg | MEDICATED_PATCH | Freq: Every day | TRANSDERMAL | 0 refills | Status: AC

## 2023-10-03 MED ORDER — POTASSIUM CHLORIDE ER 20 MEQ PO TBCR: 40.0000 meq | EXTENDED_RELEASE_TABLET | Freq: Every day | ORAL | 0 refills | Status: DC

## 2023-10-03 MED ORDER — PROMETHAZINE HCL 25 MG PO TABS: 25.0000 mg | ORAL_TABLET | Freq: Three times a day (TID) | ORAL | 0 refills | Status: DC | PRN

## 2023-10-03 MED ORDER — PANTOPRAZOLE SODIUM 40 MG PO TBEC: 40.0000 mg | DELAYED_RELEASE_TABLET | Freq: Every day | ORAL | 2 refills | Status: AC

## 2023-10-03 NOTE — Plan of Care (Signed)

## 2023-10-03 NOTE — Progress Notes (Signed)
 Pt still with significant nausea. Able to take 2 bites of breakfast but states, "I feel like I can barely keep it down. My nausea is so bad. I'd really like to find out what is causing the nausea because this isn't normal for me."

## 2023-10-03 NOTE — Discharge Summary (Signed)
 Physician Discharge Summary   Patient: Katrina Page MRN: [id number] DOB: 11/10/1980  Admit date:     09/30/2023  Discharge date: 10/03/23  Discharge Physician: Concepcion Riser   PCP: [facility]   Recommendations at discharge:   PCP follow-up in 1 week. Advised BMP check in 1 week as her potassium is low. Advised to stop smoking, consuming alcohol and cannabis.  Discharge Diagnoses: Principal Problem:   PNA (pneumonia) Active Problems:   History of pulmonary embolus (PE)   Tobacco use   Alcohol abuse   Hypokalemia   Polysubstance abuse (HCC)  Resolved Problems:   * No resolved hospital problems. [facility] Hospital Course: Katrina Page is a 43 y.o. female with medical history significant of obesity, history of PE, tobacco abuse, alcohol use presenting with pneumonia.  Patient reports cough since January 1st was evaluated in the ER on December 31 and diagnosed with pneumonia.  Was placed on course of Keflex  as well as azithromycin .  Patient reports persistent symptoms despite treatment.    Patient is admitted to the hospitalist service for further management evaluation of pneumonia. Utox positive for Cannabis, opiates.   Assessment and Plan: * PNA (pneumonia) Worsening cough and shortness of breath x 1 week Noted left lower lobe pneumonia on CT of the chest. She failed outpatient oral antibiotics. Respiratory panel, covid negative. Strep pneumo ag negative. Rocephin  azithromycin  changed to Augmentin  therapy for 5 more days   Polysubstance abuse Denies regular ETOH use at present  Monitor for withdrawal symptoms. 1/2 PPD smoker, Urine drug screen positive for cannabis. Discussed cessation.  She is motivated to stop. Nicotine  patch prescribed.   History of pulmonary embolus (PE) CTA negative for PE today. Patient is currently not taking any anticoagulation therapy.   Obesity with BMI 31.01: Diet exercise and weight reduction advised.    Hypokalemia: Due to poor oral intake. K 2.8 Oral potassium supplements ordered. Advised to follow-up with PCP for BMP in 1 week.       Consultants: None Procedures performed: None Disposition: Home Diet recommendation:  Discharge Diet Orders (From admission, onward)     Start     Ordered   10/03/23 0000  Diet - low sodium heart healthy        10/03/23 1017           Cardiac diet DISCHARGE MEDICATION: Allergies as of 10/03/2023       Reactions   Bupropion Other (See Comments)   Seizures Other Reaction(s): Other (See Comments), Other (See Comments) seizure Seizure        Medication List     STOP taking these medications    azithromycin  250 MG tablet Commonly known as: Zithromax  Z-Pak   cephALEXin  500 MG capsule Commonly known as: KEFLEX    chlordiazePOXIDE  5 MG capsule Commonly known as: LIBRIUM    cyanocobalamin  1000 MCG tablet   cyclobenzaprine  5 MG tablet Commonly known as: FLEXERIL    famotidine  20 MG tablet Commonly known as: PEPCID    gabapentin  300 MG capsule Commonly known as: Neurontin    HYDROcodone -acetaminophen  5-325 MG tablet Commonly known as: NORCO/VICODIN   meloxicam  15 MG tablet Commonly known as: MOBIC    oxybutynin  5 MG tablet Commonly known as: DITROPAN    predniSONE  10 MG tablet Commonly known as: DELTASONE    tiZANidine 4 MG tablet Commonly known as: ZANAFLEX   Venlafaxine  HCl 150 MG Tb24       TAKE these medications    albuterol  108 (90 Base) MCG/ACT inhaler Commonly known as:  VENTOLIN  HFA Inhale 1-2 puffs into the lungs every 4 (four) hours as needed for wheezing or shortness of breath.   amoxicillin -clavulanate 875-125 MG tablet Commonly known as: AUGMENTIN  Take 1 tablet by mouth 2 (two) times daily for 5 days.   chlorpheniramine-HYDROcodone  10-8 MG/5ML Commonly known as: TUSSIONEX Take 5 mLs by mouth every 12 (twelve) hours as needed for cough.   Folic Acid -Vit B6-Vit B12 0.4-50-0.1 MG Tabs Take 1  tablet by mouth daily.   methocarbamol  500 MG tablet Commonly known as: ROBAXIN  Take 1 tablet (500 mg total) by mouth every 6 (six) hours as needed for muscle spasms.   nicotine  21 mg/24hr patch Commonly known as: NICODERM CQ  - dosed in mg/24 hours Place 1 patch (21 mg total) onto the skin daily.   ondansetron  4 MG disintegrating tablet Commonly known as: ZOFRAN -ODT Take 1 tablet (4 mg total) by mouth every 8 (eight) hours as needed for nausea or vomiting. What changed: reasons to take this   pantoprazole  40 MG tablet Commonly known as: PROTONIX  Take 1 tablet (40 mg total) by mouth daily.   Potassium Chloride  ER 20 MEQ Tbcr Take 2 tablets (40 mEq total) by mouth daily for 10 days. What changed: how much to take   promethazine  25 MG tablet Commonly known as: PHENERGAN  Take 1 tablet (25 mg total) by mouth every 8 (eight) hours as needed for up to 10 days for nausea or vomiting. What changed: when to take this        Discharge Exam: Filed Weights   09/30/23 0915  Weight: 95.3 kg   General - Young obese Caucasian female, no apparent distress HEENT - PERRLA, EOMI, atraumatic head, non tender sinuses. Lung - Clear, basilar rhonchi, no wheezes. Heart - S1, S2 heard, no murmurs, rubs, trace pedal edema. Abdomen - Soft, non tender, nondistended, bowel sounds good Neuro - Alert, awake and oriented x 3, non focal exam. Skin - Warm and dry.  Condition at discharge: stable  The results of significant diagnostics from this hospitalization (including imaging, microbiology, ancillary and laboratory) are listed below for reference.   Imaging Studies: CT Angio Chest PE W and/or Wo Contrast Result Date: 09/30/2023 CLINICAL DATA:  Cough and shortness of breath.  Nausea and vomiting. EXAM: CT ANGIOGRAPHY CHEST CT ABDOMEN AND PELVIS WITH CONTRAST TECHNIQUE: Multidetector CT imaging of the chest was performed using the standard protocol during bolus administration of intravenous contrast.  Multiplanar CT image reconstructions and MIPs were obtained to evaluate the vascular anatomy. Multidetector CT imaging of the abdomen and pelvis was performed using the standard protocol during bolus administration of intravenous contrast. RADIATION DOSE REDUCTION: This exam was performed according to the departmental dose-optimization program which includes automated exposure control, adjustment of the mA and/or kV according to patient size and/or use of iterative reconstruction technique. CONTRAST:  OMNIPAQUE  IOHEXOL  350 MG/ML SOLN COMPARISON:  Chest x-ray from same day. CT abdomen pelvis dated April 30, 2020. CT chest dated July 23, 2019. FINDINGS: CTA CHEST FINDINGS Cardiovascular: Satisfactory opacification of the pulmonary arteries to the segmental level. No evidence of pulmonary embolism. Normal heart size. No pericardial effusion. No thoracic aortic aneurysm or dissection. Mediastinum/Nodes: No enlarged mediastinal, hilar, or axillary lymph nodes. Prominent subcentimeter hilar lymph nodes are likely reactive. Thyroid gland, trachea, and esophagus demonstrate no significant findings. Lungs/Pleura: Diffuse peribronchial thickening. Subtle subpleural ground-glass density in the anterior right upper lobe. Small ill-defined opacity in the central left lower lobe. Mild pleuroparenchymal scarring in the anterior and posterior  right lower lobe. No focal consolidation, pleural effusion, or pneumothorax. Musculoskeletal: No chest wall abnormality. No acute or significant osseous findings. Review of the MIP images confirms the above findings. CT ABDOMEN PELVIS FINDINGS Hepatobiliary: No focal liver abnormality is seen. Status post cholecystectomy. No biliary dilatation. Pancreas: Unremarkable. No pancreatic ductal dilatation or surrounding inflammatory changes. Spleen: Normal in size without focal abnormality. 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 2.1 x 1.5 cm and 1.3 x 1.8 cm right adrenal myelolipomas, also increased in size since the prior study. Multiple tiny left renal calculi, increased in number since the prior study. Multiple tiny right renal calculi, similar in number to the prior study. Unchanged bilateral renal cortical scarring. No ureteral calculi hydronephrosis. The bladder is unremarkable for the degree of distention. Stomach/Bowel: Stomach is within normal limits. Appendix appears normal. No evidence of bowel wall thickening, distention, or inflammatory changes. Vascular/Lymphatic: No significant vascular findings are present. No enlarged abdominal or pelvic lymph nodes. Reproductive: Slight interval increase in size of a 3.0 cm exophytic fibroid arising from the anterior fundus. No adnexal mass. Other: No abdominal wall hernia or abnormality. No abdominopelvic ascites. No pneumoperitoneum. Musculoskeletal: No acute or significant osseous findings. Review of the MIP images confirms the above findings. IMPRESSION: CT chest: 1. No evidence of pulmonary embolism. 2. Diffuse peribronchial thickening with subtle subpleural ground-glass density in the anterior right upper lobe and small ill-defined opacity in the central left lower lobe, likely infectious/inflammatory. CT abdomen and pelvis: 1. No acute intra-abdominal process. 2. Bilateral nonobstructive nephrolithiasis. 3. Bilateral adrenal myelolipomas, increased in size since the prior study. No follow-up imaging is recommended. Electronically Signed   By: Elsie ONEIDA Shoulder M.D.   On: 09/30/2023 14:03   CT ABDOMEN PELVIS W CONTRAST Result Date: 09/30/2023 CLINICAL DATA:  Cough and shortness of breath.  Nausea and vomiting. EXAM: CT ANGIOGRAPHY CHEST CT ABDOMEN AND PELVIS WITH CONTRAST TECHNIQUE: Multidetector CT imaging of the chest was performed using the standard protocol during bolus administration of intravenous contrast. Multiplanar CT image reconstructions and MIPs were obtained to  evaluate the vascular anatomy. Multidetector CT imaging of the abdomen and pelvis was performed using the standard protocol during bolus administration of intravenous contrast. RADIATION DOSE REDUCTION: This exam was performed according to the departmental dose-optimization program which includes automated exposure control, adjustment of the mA and/or kV according to patient size and/or use of iterative reconstruction technique. CONTRAST:  OMNIPAQUE  IOHEXOL  350 MG/ML SOLN COMPARISON:  Chest x-ray from same day. CT abdomen pelvis dated April 30, 2020. CT chest dated July 23, 2019. FINDINGS: CTA CHEST FINDINGS Cardiovascular: Satisfactory opacification of the pulmonary arteries to the segmental level. No evidence of pulmonary embolism. Normal heart size. No pericardial effusion. No thoracic aortic aneurysm or dissection. Mediastinum/Nodes: No enlarged mediastinal, hilar, or axillary lymph nodes. Prominent subcentimeter hilar lymph nodes are likely reactive. Thyroid gland, trachea, and esophagus demonstrate no significant findings. Lungs/Pleura: Diffuse peribronchial thickening. Subtle subpleural ground-glass density in the anterior right upper lobe. Small ill-defined opacity in the central left lower lobe. Mild pleuroparenchymal scarring in the anterior and posterior right lower lobe. No focal consolidation, pleural effusion, or pneumothorax. Musculoskeletal: No chest wall abnormality. No acute or significant osseous findings. Review of the MIP images confirms the above findings. CT ABDOMEN PELVIS FINDINGS Hepatobiliary: No focal liver abnormality is seen. Status post cholecystectomy. No biliary dilatation. Pancreas: Unremarkable. No pancreatic ductal dilatation or surrounding inflammatory changes. Spleen: Normal in size  without focal abnormality. 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 2.1 x 1.5 cm and 1.3 x 1.8 cm right adrenal myelolipomas,  also increased in size since the prior study. Multiple tiny left renal calculi, increased in number since the prior study. Multiple tiny right renal calculi, similar in number to the prior study. Unchanged bilateral renal cortical scarring. No ureteral calculi hydronephrosis. The bladder is unremarkable for the degree of distention. Stomach/Bowel: Stomach is within normal limits. Appendix appears normal. No evidence of bowel wall thickening, distention, or inflammatory changes. Vascular/Lymphatic: No significant vascular findings are present. No enlarged abdominal or pelvic lymph nodes. Reproductive: Slight interval increase in size of a 3.0 cm exophytic fibroid arising from the anterior fundus. No adnexal mass. Other: No abdominal wall hernia or abnormality. No abdominopelvic ascites. No pneumoperitoneum. Musculoskeletal: No acute or significant osseous findings. Review of the MIP images confirms the above findings. IMPRESSION: CT chest: 1. No evidence of pulmonary embolism. 2. Diffuse peribronchial thickening with subtle subpleural ground-glass density in the anterior right upper lobe and small ill-defined opacity in the central left lower lobe, likely infectious/inflammatory. CT abdomen and pelvis: 1. No acute intra-abdominal process. 2. Bilateral nonobstructive nephrolithiasis. 3. Bilateral adrenal myelolipomas, increased in size since the prior study. No follow-up imaging is recommended. Electronically Signed   By: Elsie ONEIDA Shoulder M.D.   On: 09/30/2023 14:03   DG Chest 2 View Result Date: 09/30/2023 CLINICAL DATA:  Shortness of breath. EXAM: CHEST - 2 VIEW COMPARISON:  September 22, 2023. FINDINGS: The heart size and mediastinal contours are within normal limits. Both lungs are clear. The visualized skeletal structures are unremarkable. IMPRESSION: No active cardiopulmonary disease. Electronically Signed   By: Lynwood Landy Raddle M.D.   On: 09/30/2023 09:55   DG Chest Port 1 View Result Date:  09/22/2023 CLINICAL DATA:  10031 Cough 10031 EXAM: PORTABLE CHEST 1 VIEW COMPARISON:  08/28/2020 chest radiograph. FINDINGS: Stable cardiomediastinal silhouette with normal heart size. No pneumothorax. No pleural effusion. Patchy hazy right costophrenic angle opacity. No pulmonary edema. IMPRESSION: Patchy hazy right costophrenic angle opacity, cannot exclude developing pneumonia. Suggest chest radiographic follow-up in 4-6 weeks to ensure resolution. Electronically Signed   By: Selinda DELENA Blue M.D.   On: 09/22/2023 22:01    Microbiology: Results for orders placed or performed during the hospital encounter of 09/30/23  Resp panel by RT-PCR (RSV, Flu A&B, Covid) Anterior Nasal Swab     Status: None   Collection Time: 09/30/23 10:22 AM   Specimen: Anterior Nasal Swab  Result Value Ref Range Status   SARS Coronavirus 2 by RT PCR NEGATIVE NEGATIVE Final    Comment: (NOTE) SARS-CoV-2 target nucleic acids are NOT DETECTED.  The SARS-CoV-2 RNA is generally detectable in upper respiratory specimens during the acute phase of infection. The lowest concentration of SARS-CoV-2 viral copies this assay can detect is 138 copies/mL. A negative result does not preclude SARS-Cov-2 infection and should not be used as the sole basis for treatment or other patient management decisions. A negative result may occur with  improper specimen collection/handling, submission of specimen other than nasopharyngeal swab, presence of viral mutation(s) within the areas targeted by this assay, and inadequate number of viral copies(<138 copies/mL). A negative result must be combined with clinical observations, patient history, and epidemiological information. The expected result is Negative.  Fact Sheet for Patients:  bloggercourse.com  Fact Sheet for Healthcare Providers:  seriousbroker.it  This test is no t yet approved  or cleared by the [place]  FDA and  has been  authorized for detection and/or diagnosis of SARS-CoV-2 by FDA under an Emergency Use Authorization (EUA). This EUA will remain  in effect (meaning this test can be used) for the duration of the COVID-19 declaration under Section 564(b)(1) of the Act, 21 U.S.C.section 360bbb-3(b)(1), unless the authorization is terminated  or revoked sooner.       Influenza A by PCR NEGATIVE NEGATIVE Final   Influenza B by PCR NEGATIVE NEGATIVE Final    Comment: (NOTE) The Xpert Xpress SARS-CoV-2/FLU/RSV plus assay is intended as an aid in the diagnosis of influenza from Nasopharyngeal swab specimens and should not be used as a sole basis for treatment. Nasal washings and aspirates are unacceptable for Xpert Xpress SARS-CoV-2/FLU/RSV testing.  Fact Sheet for Patients: bloggercourse.com  Fact Sheet for Healthcare Providers: seriousbroker.it  This test is not yet approved or cleared by the [place]  FDA and has been authorized for detection and/or diagnosis of SARS-CoV-2 by FDA under an Emergency Use Authorization (EUA). This EUA will remain in effect (meaning this test can be used) for the duration of the COVID-19 declaration under Section 564(b)(1) of the Act, 21 U.S.C. section 360bbb-3(b)(1), unless the authorization is terminated or revoked.     Resp Syncytial Virus by PCR NEGATIVE NEGATIVE Final    Comment: (NOTE) Fact Sheet for Patients: bloggercourse.com  Fact Sheet for Healthcare Providers: seriousbroker.it  This test is not yet approved or cleared by the [place]  FDA and has been authorized for detection and/or diagnosis of SARS-CoV-2 by FDA under an Emergency Use Authorization (EUA). This EUA will remain in effect (meaning this test can be used) for the duration of the COVID-19 declaration under Section 564(b)(1) of the Act, 21 U.S.C. section 360bbb-3(b)(1), unless the  authorization is terminated or revoked.  Performed at [facility], [address] [place], [place] [address]   Blood culture (routine x 2)     Status: None (Preliminary result)   Collection Time: 09/30/23  3:01 PM   Specimen: BLOOD  Result Value Ref Range Status   Specimen Description BLOOD LEFT ANTECUBITAL  Final   Special Requests   Final    BOTTLES DRAWN AEROBIC AND ANAEROBIC Blood Culture adequate volume   Culture   Final    NO GROWTH 3 DAYS Performed at [facility], [address], [place], [place] [address]    Report Status PENDING  Incomplete  Blood culture (routine x 2)     Status: None (Preliminary result)   Collection Time: 09/30/23  3:14 PM   Specimen: BLOOD  Result Value Ref Range Status   Specimen Description BLOOD BLOOD RIGHT HAND  Final   Special Requests   Final    BOTTLES DRAWN AEROBIC AND ANAEROBIC Blood Culture results may not be optimal due to an inadequate volume of blood received in culture bottles   Culture   Final    NO GROWTH 3 DAYS Performed at [facility], [address] [place], [place] [address]    Report Status PENDING  Incomplete    Labs: CBC: Recent Labs  Lab 09/30/23 0921 10/01/23 0430 10/02/23 0746  WBC 14.4* 10.0 10.7*  HGB 13.0 11.4* 10.5*  HCT 40.4 36.3 34.4*  MCV 83.5 85.8 86.2  PLT 309 247 222   Basic Metabolic Panel: Recent Labs  Lab 09/30/23 0921 10/01/23 0430 10/02/23 0746  NA 145 139 142  K 3.0* 2.9* 2.8*  CL 100 99 104  CO2  24 28 27   GLUCOSE 149* 146* 97  BUN 8 8 10   CREATININE 0.95 0.86 1.04*  CALCIUM 9.4 8.5* 8.5*   Liver Function Tests: Recent Labs  Lab 09/30/23 0921 10/01/23 0430  AST 36 20  ALT 18 16  ALKPHOS 76 59  BILITOT 1.3* 0.8  PROT 8.0 6.9  ALBUMIN 4.0 3.4*   CBG: No results for input(s): GLUCAP in the last 168 hours.  Discharge time spent: 35 minutes.  Signed: Concepcion Riser, MD [facility] 10/03/2023

## 2023-10-03 NOTE — Progress Notes (Signed)
 Pt states she feels comfortable to be discharged and is grateful to rest at home.

## 2023-10-05 LAB — CULTURE, BLOOD (ROUTINE X 2)
Culture: NO GROWTH
Culture: NO GROWTH
Special Requests: ADEQUATE

## 2023-10-18 NOTE — Patient Instructions (Incomplete)

## 2023-10-22 ENCOUNTER — Ambulatory Visit: Payer: MEDICAID | Admitting: Nurse Practitioner

## 2024-02-28 ENCOUNTER — Emergency Department
Admission: EM | Admit: 2024-02-28 | Discharge: 2024-03-02 | Disposition: A | Discharge status: Home / Self Care | Payer: Self-pay | Attending: Emergency Medicine | Admitting: Emergency Medicine

## 2024-02-28 ENCOUNTER — Encounter: Payer: Self-pay | Admitting: Emergency Medicine

## 2024-02-28 ENCOUNTER — Other Ambulatory Visit: Payer: Self-pay

## 2024-02-28 DIAGNOSIS — F332 Major depressive disorder, recurrent severe without psychotic features: Secondary | ICD-10-CM | POA: Insufficient documentation

## 2024-02-28 DIAGNOSIS — F17210 Nicotine dependence, cigarettes, uncomplicated: Secondary | ICD-10-CM | POA: Insufficient documentation

## 2024-02-28 DIAGNOSIS — F1020 Alcohol dependence, uncomplicated: Secondary | ICD-10-CM | POA: Insufficient documentation

## 2024-02-28 DIAGNOSIS — R45851 Suicidal ideations: Secondary | ICD-10-CM | POA: Insufficient documentation

## 2024-02-28 DIAGNOSIS — Y908 Blood alcohol level of 240 mg/100 ml or more: Secondary | ICD-10-CM | POA: Insufficient documentation

## 2024-02-28 DIAGNOSIS — F10920 Alcohol use, unspecified with intoxication, uncomplicated: Secondary | ICD-10-CM | POA: Insufficient documentation

## 2024-02-28 LAB — CBC
HCT: 35.8 % — ABNORMAL LOW (ref 36.0–46.0)
Hemoglobin: 11 g/dL — ABNORMAL LOW (ref 12.0–15.0)
MCH: 25.3 pg — ABNORMAL LOW (ref 26.0–34.0)
MCHC: 30.7 g/dL (ref 30.0–36.0)
MCV: 82.3 fL (ref 80.0–100.0)
Platelets: 313 10*3/uL (ref 150–400)
RBC: 4.35 MIL/uL (ref 3.87–5.11)
RDW: 18.3 % — ABNORMAL HIGH (ref 11.5–15.5)
WBC: 7.9 10*3/uL (ref 4.0–10.5)
nRBC: 0 % (ref 0.0–0.2)

## 2024-02-28 LAB — URINE DRUG SCREEN, QUALITATIVE (ARMC ONLY)
Amphetamines, Ur Screen: NOT DETECTED
Barbiturates, Ur Screen: NOT DETECTED
Benzodiazepine, Ur Scrn: NOT DETECTED
Cannabinoid 50 Ng, Ur ~~LOC~~: POSITIVE — AB
Cocaine Metabolite,Ur ~~LOC~~: NOT DETECTED
MDMA (Ecstasy)Ur Screen: NOT DETECTED
Methadone Scn, Ur: NOT DETECTED
Opiate, Ur Screen: NOT DETECTED
Phencyclidine (PCP) Ur S: NOT DETECTED
Tricyclic, Ur Screen: NOT DETECTED

## 2024-02-28 LAB — ETHANOL: Alcohol, Ethyl (B): 242 mg/dL — ABNORMAL HIGH (ref <15)

## 2024-02-28 LAB — POC URINE PREG, ED: Preg Test, Ur: NEGATIVE

## 2024-02-28 LAB — COMPREHENSIVE METABOLIC PANEL WITH GFR
ALT: 12 U/L (ref 0–44)
AST: 16 U/L (ref 15–41)
Albumin: 3.9 g/dL (ref 3.5–5.0)
Alkaline Phosphatase: 49 U/L (ref 38–126)
Anion gap: 10 (ref 5–15)
BUN: 7 mg/dL (ref 6–20)
CO2: 25 mmol/L (ref 22–32)
Calcium: 8.8 mg/dL — ABNORMAL LOW (ref 8.9–10.3)
Chloride: 107 mmol/L (ref 98–111)
Creatinine, Ser: 0.81 mg/dL (ref 0.44–1.00)
GFR, Estimated: >60 mL/min (ref >60)
Glucose, Bld: 110 mg/dL — ABNORMAL HIGH (ref 70–99)
Potassium: 3.4 mmol/L — ABNORMAL LOW (ref 3.5–5.1)
Sodium: 142 mmol/L (ref 135–145)
Total Bilirubin: 0.5 mg/dL (ref 0.0–1.2)
Total Protein: 7.6 g/dL (ref 6.5–8.1)

## 2024-02-28 MED ORDER — THIAMINE HCL 100 MG/ML IJ SOLN
100.0000 mg | Freq: Every day | INTRAMUSCULAR | Status: DC
  Filled 2024-02-28 (×3): qty 1

## 2024-02-28 MED ORDER — LORAZEPAM 2 MG PO TABS: 0.0000 mg | ORAL_TABLET | Freq: Two times a day (BID) | ORAL | Status: DC

## 2024-02-28 MED ORDER — LORAZEPAM 2 MG/ML IJ SOLN: 0.0000 mg | Freq: Four times a day (QID) | INTRAMUSCULAR | Status: AC

## 2024-02-28 MED ORDER — LORAZEPAM 2 MG PO TABS
0.0000 mg | ORAL_TABLET | Freq: Four times a day (QID) | ORAL | Status: AC
  Administered 2024-02-29 (×2): 1 mg via ORAL
  Administered 2024-03-01: 2 mg via ORAL
  Filled 2024-02-28 (×3): qty 1

## 2024-02-28 MED ORDER — LORAZEPAM 2 MG/ML IJ SOLN: 0.0000 mg | Freq: Two times a day (BID) | INTRAMUSCULAR | Status: DC

## 2024-02-28 MED ORDER — THIAMINE MONONITRATE 100 MG PO TABS
100.0000 mg | ORAL_TABLET | Freq: Every day | ORAL | Status: DC
  Administered 2024-02-29 – 2024-03-01 (×2): 100 mg via ORAL
  Filled 2024-02-28 (×2): qty 1

## 2024-02-28 NOTE — ED Notes (Signed)
TTS speaking with patient. 

## 2024-02-28 NOTE — ED Provider Notes (Signed)
 [facility] Provider Note    Event Date/Time   First MD Initiated Contact with Patient 02/28/24 1951     (approximate)   History   Suicidal   HPI  Katrina Page is a 43 y.o. female who comes in with police stating that she is ready to get over with that she is tired of life.  Does report drinking alcohol today.  She denies any falls or hitting her head.  She denies any other medical concerns.  She does not say an overt plan  I reviewed a note from 1/8 until 10/02/18/2025 where patient was admitted for pneumonia.  She had does have a history of PE but at that time was not taking anticoagulation anymore.  I did confirm with patient that she no longer takes anticoagulation  Physical Exam   Triage Vital Signs: ED Triage Vitals  Encounter Vitals Group     BP 02/28/24 1920 (!) 150/88     Systolic BP Percentile --      Diastolic BP Percentile --      Pulse Rate 02/28/24 1920 (!) 106     Resp 02/28/24 1920 16     Temp 02/28/24 1920 98.2 F (36.8 C)     Temp Source 02/28/24 1920 Oral     SpO2 02/28/24 1920 96 %     Weight 02/28/24 1921 250 lb (113.4 kg)     Height 02/28/24 1921 5\' 9"  (1.753 m)     Head Circumference --      Peak Flow --      Pain Score 02/28/24 1920 4     Pain Loc --      Pain Education --      Exclude from Growth Chart --     Most recent vital signs: Vitals:   02/28/24 1920  BP: (!) 150/88  Pulse: (!) 106  Resp: 16  Temp: 98.2 F (36.8 C)  SpO2: 96%     General: Awake, no distress.  CV:  Good peripheral perfusion.  Resp:  Normal effort.  Abd:  No distention.  Other:  Patient is tearful, positive SI No trauma to the head noted.  Moving all extremities well.  Answering questions appropriately.  No evidence of any trauma  ED Results / Procedures / Treatments   Labs (all labs ordered are listed, but only abnormal results are displayed) Labs Reviewed  CBC - Abnormal; Notable for the following components:      Result  Value   Hemoglobin 11.0 (*)    HCT 35.8 (*)    MCH 25.3 (*)    RDW 18.3 (*)    All other components within normal limits  POC URINE PREG, ED - Normal  COMPREHENSIVE METABOLIC PANEL WITH GFR  ETHANOL  URINE DRUG SCREEN, QUALITATIVE ([facility] ONLY)    PROCEDURES:  Critical Care performed: No  Procedures   MEDICATIONS ORDERED IN ED: Medications - No data to display   IMPRESSION / MDM / ASSESSMENT AND PLAN / ED COURSE  I reviewed the triage vital signs and the nursing notes.   Patient's presentation is most consistent with acute presentation with potential threat to life or bodily function.   Pt is without any acute medical complaints. No exam findings to suggest medical cause of current presentation. Will order psychiatric screening labs and discuss further w/ psychiatric service.  D/d includes but is not limited to psychiatric disease, behavioral/personality disorder, inadequate socioeconomic support, medical.  Based on HPI, exam, unremarkable labs, no concern  for acute medical problem at this time. No rigidity, clonus, hyperthermia, focal neurologic deficit, diaphoresis, tachycardia, meningismus, ataxia, gait abnormality or other finding to suggest this visit represents a non-psychiatric problem. Screening labs reviewed.    Given this, pt medically cleared, to be dispositioned per Psych.  Present test was negative.  CMP shows slightly low potassium at 3.4.  Alcohol level was elevated to 42.  CBC shows stable hemoglobin.  The patient has been placed in psychiatric observation due to the need to provide a safe environment for the patient while obtaining psychiatric consultation and evaluation, as well as ongoing medical and medication management to treat the patient's condition.  The patient has not been placed under full IVC at this time.    The patient is on the cardiac monitor to evaluate for evidence of arrhythmia and/or significant heart rate changes.      FINAL CLINICAL  IMPRESSION(S) / ED DIAGNOSES   Final diagnoses:  Suicidal ideation  Alcoholic intoxication without complication (HCC)     Rx / DC Orders   ED Discharge Orders     None        Note:  This document was prepared using Dragon voice recognition software and may include unintentional dictation errors.   Lubertha Rush, MD 02/28/24 2202

## 2024-02-28 NOTE — BH Assessment (Signed)
 Comprehensive Clinical Assessment (CCA) Screening, Triage and Referral Note  02/28/2024 Katrina Page [id number]  Katrina Page is a 43 year old, English speaking, Caucasian female. Pt presented to [facility] ED voluntarily for a mental health evaluation. Per triage note: Pt presents in PD custody stating she is "ready to get it over with," and that she is "tired of life." She admits to drinking heavily on a regular basis and has had an unknown amount of alcohol today prior to arriving. Pt is tearful at time of triage; she has 2 adult children and is proud of them.  Per patient report, her main stressors are feeling that the world is "fucked". Pt expressed frustration about the evil and chaotic nature of society. Pt reported that she'd used an unknown amount of cannabis and alcohol prior to arrival. Pt was in the precontemplation stage of change, explaining that she desires would like to stop drinking; however, if she could feel the relief she finds when drinking, while sober. Pt reported that she has a hx of seizures for withdrawal symptoms. Pt is not medication compliant and is not connected to services. Pt reported that she does not have a support system and currently lives in a hotel with her 2 children and girlfriend. Pt reported that her relationships with her children and partner are strained because they don't like her drinking. Pt also feels that her family is untrustworthy. Pt had lacking insight and poor judgement. Pt had slurred speech and her thoughts were relevant. Pt was cooperative throughout the assessment. Pt's mood was depressed; affect was tearful. Pt had a disheveled appearance. Pt continued to endorse passive SI, explaining that she is "tired and wish I could go to sleep and not wake up." Pt denied HI/AV/H. Pt's BAL is 242; UDS is unremarkable. Chief Complaint:  Chief Complaint  Patient presents with   Suicidal   Visit Diagnosis: MDD (major depressive disorder), severe (HCC)   Alcohol  abuse   MDD (major depressive disorder)   Severe recurrent major depression without psychotic features (HCC)  Patient Reported Information How did you hear about us ? No data recorded What Is the Reason for Your Visit/Call Today? No data recorded How Long Has This Been Causing You Problems? No data recorded What Do You Feel Would Help You the Most Today? No data recorded  Have You Recently Had Any Thoughts About Hurting Yourself? No data recorded Are You Planning to Commit Suicide/Harm Yourself At This time? No data recorded  Have you Recently Had Thoughts About Hurting Someone Marigene Shoulder? No data recorded Are You Planning to Harm Someone at This Time? No data recorded Explanation: No data recorded  Have You Used Any Alcohol or Drugs in the Past 24 Hours? No data recorded How Long Ago Did You Use Drugs or Alcohol? No data recorded What Did You Use and How Much? No data recorded  Do You Currently Have a Therapist/Psychiatrist? No data recorded Name of Therapist/Psychiatrist: No data recorded  Have You Been Recently Discharged From Any Office Practice or Programs? No data recorded Explanation of Discharge From Practice/Program: No data recorded   CCA Screening Triage Referral Assessment Type of Contact: No data recorded Telemedicine Service Delivery:   Is this Initial or Reassessment?   Date Telepsych consult ordered in [facility]:    Time Telepsych consult ordered in [facility]:    Location of Assessment: No data recorded Provider Location: No data recorded   Collateral Involvement: No data recorded  Does Patient Have a Court Appointed Legal Guardian? No  data recorded Name and Contact of Legal Guardian: No data recorded If Minor and Not Living with Parent(s), Who has Custody? No data recorded Is CPS involved or ever been involved? No data recorded Is APS involved or ever been involved? No data recorded  Patient Determined To Be At Risk for Harm To Self or Others Based on Review of Patient  Reported Information or Presenting Complaint? No data recorded Method: No data recorded Availability of Means: No data recorded Intent: No data recorded Notification Required: No data recorded Additional Information for Danger to Others Potential: No data recorded Additional Comments for Danger to Others Potential: No data recorded Are There Guns or Other Weapons in Your Home? No data recorded Types of Guns/Weapons: No data recorded Are These Weapons Safely Secured?                            No data recorded Who Could Verify You Are Able To Have These Secured: No data recorded Do You Have any Outstanding Charges, Pending Court Dates, Parole/Probation? No data recorded Contacted To Inform of Risk of Harm To Self or Others: No data recorded  Does Patient Present under Involuntary Commitment? No data recorded   [place] of Residence: No data recorded  Patient Currently Receiving the Following Services: No data recorded  Determination of Need: No data recorded  Options For Referral: No data recorded  Disposition Recommendation per psychiatric provider: Pending [facility] Consult  Auther Lyerly R Colen Eltzroth, LCAS
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
## 2024-02-28 NOTE — ED Notes (Signed)
 Pt received sandwich tray and drink

## 2024-02-28 NOTE — BH Assessment (Signed)
 [facility] telepsych consult requested.

## 2024-02-28 NOTE — ED Notes (Signed)
 Patient to BHU 6, oriented to unit in regards to camera and rounding.

## 2024-02-28 NOTE — ED Triage Notes (Signed)
 Pt presents in PD custody stating she is "ready to get it over with," and that she is "tired of life." She admits to drinking heavily on a regular basis and has had an unknown amount of alcohol today prior to arriving. Pt is tearful at time of triage; she has 2 adult children and is proud of them.

## 2024-02-28 NOTE — ED Notes (Signed)
 Pt dressed out into hospital provided burgundy scrubs with this tech and Craige Dixon, RN in the rm. Pt belongings consist of: 1 black pants, black crocs, one blue shirt, black panties, a blue hat, and a black bra. Pt calm and cooperative while dressing out. Pt belongings placed into one pt belongings bag and labeled with pt name.

## 2024-02-28 NOTE — Progress Notes (Signed)
 Attempted to see pt for psychiatric evaluation; 2 telecarts are not working will need to be rescheduled once telehealth equipment is working.

## 2024-02-28 NOTE — ED Notes (Signed)
 Patient speaking with [facility] telepsych provider.

## 2024-02-29 ENCOUNTER — Encounter: Payer: Self-pay | Admitting: Psychiatric/Mental Health

## 2024-02-29 DIAGNOSIS — R45851 Suicidal ideations: Secondary | ICD-10-CM

## 2024-02-29 DIAGNOSIS — F332 Major depressive disorder, recurrent severe without psychotic features: Secondary | ICD-10-CM

## 2024-02-29 DIAGNOSIS — F1020 Alcohol dependence, uncomplicated: Secondary | ICD-10-CM

## 2024-02-29 MED ORDER — OLANZAPINE 10 MG IM SOLR: 10.0000 mg | Freq: Once | INTRAMUSCULAR | Status: DC | PRN

## 2024-02-29 MED ORDER — ACETAMINOPHEN 325 MG PO TABS
650.0000 mg | ORAL_TABLET | Freq: Four times a day (QID) | ORAL | Status: DC | PRN
  Administered 2024-02-29 – 2024-03-02 (×4): 650 mg via ORAL
  Filled 2024-02-29 (×4): qty 2

## 2024-02-29 MED ORDER — ADULT MULTIVITAMIN W/MINERALS CH
1.0000 | ORAL_TABLET | Freq: Every day | ORAL | Status: DC
Start: 2024-02-29 — End: 2024-03-02
  Administered 2024-02-29 – 2024-03-01 (×2): 1 via ORAL
  Filled 2024-02-29 (×2): qty 1

## 2024-02-29 MED ORDER — FOLIC ACID 1 MG PO TABS
1.0000 mg | ORAL_TABLET | Freq: Every day | ORAL | Status: DC
  Administered 2024-02-29 – 2024-03-01 (×2): 1 mg via ORAL
  Filled 2024-02-29 (×2): qty 1

## 2024-02-29 MED ORDER — NICOTINE 7 MG/24HR TD PT24
7.0000 mg | MEDICATED_PATCH | Freq: Once | TRANSDERMAL | Status: AC
  Administered 2024-02-29: 7 mg via TRANSDERMAL
  Filled 2024-02-29: qty 1

## 2024-02-29 NOTE — ED Notes (Signed)
 Dinner tray and a cup of non caffeinated soda provided. Pt sitting up in bed eating.

## 2024-02-29 NOTE — Progress Notes (Signed)
 Per [facility] Danika, there are no appropriate beds available for pt today.  Pt was referred to the following facilities:  Service Provider Phone Fax  [facility] [phone]  [facility] [phone]  [facility] [phone]  [facility] [phone]  [facility] [phone]  [facility] [phone]  [facility] [phone]  [place] [facility] [phone]  [facility] [phone]  [facility] [phone]  [facility] [phone]  [facility] [phone], [facility] [phone]

## 2024-02-29 NOTE — ED Notes (Signed)
 VOL/Psych Consult ordered/ Issues with Telecart

## 2024-02-29 NOTE — ED Notes (Signed)
 Due to patient having headache. RN requested tylenol  from provider.

## 2024-02-29 NOTE — ED Notes (Addendum)
 Provided patient with phone to call friend Camilo Cella [phone] Per Patient it was Okay to speak with friend.

## 2024-02-29 NOTE — ED Provider Notes (Signed)
 Emergency Medicine Observation Re-evaluation Note  Katrina Page is a 43 y.o. female, seen on rounds today.  Pt initially presented to the ED for complaints of Suicidal Currently, the patient is resting in no distress.  Physical Exam  BP (!) 150/88 (BP Location: Left Arm)   Pulse (!) 106   Temp 98.2 F (36.8 C) (Oral)   Resp 16   Ht 5\' 9"  (1.753 m)   Wt 113.4 kg   LMP 02/24/2024 (Exact Date)   SpO2 96%   BMI 36.92 kg/m  Physical Exam General: resting in no distress  ED Course / MDM  EKG:  No acute events overnight.   Plan  Current plan is for disposition recommendations per psychiatry and case management.  Psych recommending voluntary commitment. Placement pending.     Collis Deaner, MD 02/29/24 [phone]

## 2024-02-29 NOTE — ED Notes (Signed)
 No concerning behaviors noted during shift.

## 2024-02-29 NOTE — ED Notes (Signed)
Patient awake and asking for water

## 2024-02-29 NOTE — Consult Note (Signed)
 [facility] Consult Note  Patient Name: Katrina Page MRN: [id number] DOB: April 14, 1981 DATE OF Consult: 02/29/2024  PRIMARY PSYCHIATRIC DIAGNOSES  1.  Major Depressive Disorder, Recurrent, Severe, without Psychotic Features 2.  Suicidal Ideations 3.  Alcohol Use Disorder, Severe, Dependence    RECOMMENDATIONS  Recommendations: Medication recommendations:  -- Continue Ativan  Protocol with CIWA monitoring -- Added Folic Acid  1mg  po daily and Multivitamin 1 tablet po daily in the setting of alcohol withdrawal -- Continue Thiamine  100mg  po daily  -- Zyprexa 10mg  PO/IM Q6H PRN for acute agitation  Non-Medication/therapeutic recommendations:  -- Suicide precautions -- Inpatient hospitalization VOL at this time. Does meet criteria for IVC if needed.  Is inpatient psychiatric hospitalization recommended for this patient? Yes (Explain why): Patient meets criteria for inpatient psychiatric admission- SI with plan.   Follow-Up Telepsychiatry C/L services: We will sign off for now. Please re-consult our service if needed for any concerning changes in the patient's condition, discharge planning, or questions.  Communication: Treatment team members (and family members if applicable) who were involved in treatment/care discussions and planning, and with whom we spoke or engaged with via secure text/chat, include the following: ED Team  Thank you for involving us  in the care of this patient. If you have any additional questions or concerns, please call [phone] and ask for me or the provider on-call.  TELEPSYCHIATRY ATTESTATION & CONSENT  As the provider for this telehealth consult, I attest that I verified the patient's identity using two separate identifiers, introduced myself to the patient, provided my credentials, disclosed my location, and performed this encounter via a HIPAA-compliant, real-time, face-to-face, two-way, interactive audio and video platform and with the full consent and  agreement of the patient (or guardian as applicable.)  Patient physical location: ED in [facility]. Telehealth provider physical location: home office in state of [place]   Video start time: 0637 [facility] Time) Video end time: 0650 [facility] Time)  IDENTIFYING DATA  DA MICHELLE is a 43 y.o. year-old female for whom a psychiatric consultation has been ordered by the primary provider. The patient was identified using two separate identifiers.  CHIEF COMPLAINT/REASON FOR CONSULT  Suicidal Ideations  HISTORY OF PRESENT ILLNESS (HPI)  The patient is a 43yo female who presented to the emergency department last night via [facility] stating that she was "ready to get it over with" and tired of life. Reported alcohol use prior to arrival. BAL 242 upon arrival and UDS + cannabis.   Upon evaluation, patient is calm and cooperative. Flat affect, depressed appearing. Thought process linear, behavior appropriate. Patient states she came to he hospital because "life in general". She states she is sick of this world with all the "hate and misery". Shares she lives with her significant other and children but "they don't need me anymore". Complains about her relationship strain with her SO and children and financial issues they face. Patient is vague, brief when discussing current stressors. Patient states she feels like "popping a bottle of Tylenol  would be easier than having to do this again tomorrow". Patient endorses daily suicidal ideations for the last several weeks. Has prior attempts by overdosing on alcohol. Continues to endorse passive SI and the desire to not live anymore. No homicidal ideations. No hallucinations, no paranoia, no delusions apparent otherwise. Noted frequent, excessive alcohol consumption that she believes may be self-medicating her depression, hopelessness. Alcohol dependence chronic. Admits to marijuana use as well.    PAST PSYCHIATRIC HISTORY  History of Bipolar  II Disorder, Alcohol Use Disorder, Borderline Personality Disorder Multiple hospitalizations in past. Per chart review, 07/2021 at [facility]  No current outpatient care nor currently prescribed psychotropic medications Prior suicide attempts by overdosing on alcohol   Otherwise as per HPI above.  PAST MEDICAL HISTORY  Past Medical History:  Diagnosis Date   Adrenal nodule (HCC) 06/2019   left   Alcohol abuse    Anemia    Bronchitis    Depression    History of kidney stones    Migraines    Nodule of right lung 06/2019   Pulmonary emboli (HCC) 07/23/2019   bilateral   Thrombocytopenia (HCC)      HOME MEDICATIONS  Facility Ordered Medications  Medication   LORazepam  (ATIVAN ) injection 0-4 mg   Or   LORazepam  (ATIVAN ) tablet 0-4 mg   [START ON 03/02/2024] LORazepam  (ATIVAN ) injection 0-4 mg   Or   [START ON 03/02/2024] LORazepam  (ATIVAN ) tablet 0-4 mg   thiamine  (VITAMIN B1) tablet 100 mg   Or   thiamine  (VITAMIN B1) injection 100 mg   PTA Medications  Medication Sig   albuterol  (VENTOLIN  HFA) 108 (90 Base) MCG/ACT inhaler Inhale 1-2 puffs into the lungs every 4 (four) hours as needed for wheezing or shortness of breath.   methocarbamol  (ROBAXIN ) 500 MG tablet Take 1 tablet (500 mg total) by mouth every 6 (six) hours as needed for muscle spasms. (Patient not taking: Reported on 02/28/2024)   pantoprazole  (PROTONIX ) 40 MG tablet Take 1 tablet (40 mg total) by mouth daily.   Folic Acid -Vit B6-Vit B12 0.4-50-0.1 MG TABS Take 1 tablet by mouth daily.   ondansetron  (ZOFRAN -ODT) 4 MG disintegrating tablet Take 1 tablet (4 mg total) by mouth every 8 (eight) hours as needed for nausea or vomiting. (Patient not taking: Reported on 02/28/2024)   nicotine  (NICODERM CQ  - DOSED IN MG/24 HOURS) 21 mg/24hr patch Place 1 patch (21 mg total) onto the skin daily.     ALLERGIES  Allergies  Allergen Reactions   Bupropion Other (See Comments)    Seizures  Other Reaction(s): Other (See  Comments), Other (See Comments)  seizure  Seizure    SOCIAL & SUBSTANCE USE HISTORY  Social History   Socioeconomic History   Marital status: Divorced    Spouse name: Not on file   Number of children: Not on file   Years of education: Not on file   Highest education level: Not on file  Occupational History   Not on file  Tobacco Use   Smoking status: Every Day    Current packs/day: 0.25    Average packs/day: 0.3 packs/day for 20.0 years (5.0 ttl pk-yrs)    Types: Cigarettes   Smokeless tobacco: Never  Vaping Use   Vaping status: Never Used  Substance and Sexual Activity   Alcohol use: Yes    Alcohol/week: 6.0 standard drinks of alcohol    Types: 6 Shots of liquor per week   Drug use: No   Sexual activity: Not on file  Other Topics Concern   Not on file  Social History Narrative   Not on file   Social Drivers of Health   Financial Resource Strain: High Risk (01/04/2022)   [facility]   Overall Financial Resource Strain (CARDIA)    Difficulty of Paying Living Expenses: Very hard  Food Insecurity: No Food Insecurity (10/02/2023)   Hunger Vital Sign    Worried About Running Out of Food in the  Last Year: Never true    Ran Out of Food in the Last Year: Never true  Transportation Needs: No Transportation Needs (10/02/2023)   PRAPARE - Administrator, Civil Service (Medical): No    Lack of Transportation (Non-Medical): No  Physical Activity: Not on file  Stress: Not on file  Social Connections: Not on file   Social History   Tobacco Use  Smoking Status Every Day   Current packs/day: 0.25   Average packs/day: 0.3 packs/day for 20.0 years (5.0 ttl pk-yrs)   Types: Cigarettes  Smokeless Tobacco Never   Social History   Substance and Sexual Activity  Alcohol Use Yes   Alcohol/week: 6.0 standard drinks of alcohol   Types: 6 Shots of liquor per week   Social History   Substance and Sexual  Activity  Drug Use No    Additional pertinent information: lives with children and significant other   FAMILY HISTORY  Family History  Problem Relation Age of Onset   Hypertension Mother    Diabetes Mother    Stroke Mother    Breast cancer Mother    Family Psychiatric History (if known):  None disclosed at this time    MENTAL STATUS EXAM (MSE)  Mental Status Exam: General Appearance: Disheveled  Orientation:  Full (Time, Place, and Person)  Memory:  Immediate;   Good Recent;   Good  Concentration:  Concentration: Good  Recall:  Good  Attention  Good  Eye Contact:  Fair  Speech:  Clear and Coherent  Language:  Good  Volume:  Decreased  Mood: Depressed   Affect:  Congruent and Flat  Thought Process:  Linear  Thought Content:  Logical  Suicidal Thoughts:  Yes.  with intent/plan  Homicidal Thoughts:  No  Judgement:  Poor  Insight:  Lacking  Psychomotor Activity:  Normal  Akathisia:  No  Fund of Knowledge:  Good    Assets:  Communication Skills Desire for Improvement Housing Social Support  Cognition:  WNL  ADL's:  Intact  AIMS (if indicated):       VITALS  Blood pressure (!) 150/88, pulse (!) 106, temperature 98.2 F (36.8 C), temperature source Oral, resp. rate 16, height 5\' 9"  (1.753 m), weight 113.4 kg, last menstrual period 02/24/2024, SpO2 96%.  LABS  Admission on 02/28/2024  Component Date Value Ref Range Status   Sodium 02/28/2024 142  135 - 145 mmol/L Final   Potassium 02/28/2024 3.4 (L)  3.5 - 5.1 mmol/L Final   Chloride 02/28/2024 107  98 - 111 mmol/L Final   CO2 02/28/2024 25  22 - 32 mmol/L Final   Glucose, Bld 02/28/2024 110 (H)  70 - 99 mg/dL Final   Glucose reference range applies only to samples taken after fasting for at least 8 hours.   BUN 02/28/2024 7  6 - 20 mg/dL Final   Creatinine, Ser 02/28/2024 0.81  0.44 - 1.00 mg/dL Final   Calcium 16/06/9603 8.8 (L)  8.9 - 10.3 mg/dL Final   Total Protein 54/05/8118 7.6  6.5 - 8.1 g/dL Final    Albumin 14/78/2956 3.9  3.5 - 5.0 g/dL Final   AST 21/30/8657 16  15 - 41 U/L Final   ALT 02/28/2024 12  0 - 44 U/L Final   Alkaline Phosphatase 02/28/2024 49  38 - 126 U/L Final   Total Bilirubin 02/28/2024 0.5  0.0 - 1.2 mg/dL Final   GFR, Estimated 02/28/2024 >60  >60 mL/min Final   Comment: (NOTE) Calculated using  the CKD-EPI Creatinine Equation (2021)    Anion gap 02/28/2024 10  5 - 15 Final   Performed at [facility], [address] [place], [place] [address]   Alcohol, Ethyl (B) 02/28/2024 242 (H)  <15 mg/dL Final   Comment: (NOTE) For medical purposes only. Performed at [facility], [address] [place], [place] [address]    WBC 02/28/2024 7.9  4.0 - 10.5 K/uL Final   RBC 02/28/2024 4.35  3.87 - 5.11 MIL/uL Final   Hemoglobin 02/28/2024 11.0 (L)  12.0 - 15.0 g/dL Final   HCT 09/81/1914 35.8 (L)  36.0 - 46.0 % Final   MCV 02/28/2024 82.3  80.0 - 100.0 fL Final   MCH 02/28/2024 25.3 (L)  26.0 - 34.0 pg Final   MCHC 02/28/2024 30.7  30.0 - 36.0 g/dL Final   RDW 78/29/5621 18.3 (H)  11.5 - 15.5 % Final   Platelets 02/28/2024 313  150 - 400 K/uL Final   nRBC 02/28/2024 0.0  0.0 - 0.2 % Final   Performed at [facility], [address] [place], [place] [address]   Tricyclic, Ur Screen 02/28/2024 NONE DETECTED  NONE DETECTED Final   Amphetamines, Ur Screen 02/28/2024 NONE DETECTED  NONE DETECTED Final   MDMA (Ecstasy)Ur Screen 02/28/2024 NONE DETECTED  NONE DETECTED Final   Cocaine Metabolite,Ur [place] 02/28/2024 NONE DETECTED  NONE DETECTED Final   Opiate, Ur Screen 02/28/2024 NONE DETECTED  NONE DETECTED Final   Phencyclidine (PCP) Ur S 02/28/2024 NONE DETECTED  NONE DETECTED Final   Cannabinoid 50 Ng, Ur [place] 02/28/2024 POSITIVE (A)  NONE DETECTED Final   Barbiturates, Ur Screen 02/28/2024 NONE DETECTED  NONE DETECTED Final   Benzodiazepine, Ur Scrn 02/28/2024 NONE DETECTED  NONE DETECTED Final   Methadone Scn, Ur 02/28/2024 NONE DETECTED  NONE  DETECTED Final   Comment: (NOTE) Tricyclics + metabolites, urine    Cutoff 1000 ng/mL Amphetamines + metabolites, urine  Cutoff 1000 ng/mL MDMA (Ecstasy), urine              Cutoff 500 ng/mL Cocaine Metabolite, urine          Cutoff 300 ng/mL Opiate + metabolites, urine        Cutoff 300 ng/mL Phencyclidine (PCP), urine         Cutoff 25 ng/mL Cannabinoid, urine                 Cutoff 50 ng/mL Barbiturates + metabolites, urine  Cutoff 200 ng/mL Benzodiazepine, urine              Cutoff 200 ng/mL Methadone, urine                   Cutoff 300 ng/mL  The urine drug screen provides only a preliminary, unconfirmed analytical test result and should not be used for non-medical purposes. Clinical consideration and professional judgment should be applied to any positive drug screen result due to possible interfering substances. A more specific alternate chemical method must be used in order to obtain a confirmed analytical result. Gas chromatography / mass spectrometry (GC/MS) is the preferred confirm                          atory method. Performed at [facility], [address] [place], [place] [address]    Preg Test, Ur 02/28/2024 Negative  Negative Final    PSYCHIATRIC REVIEW OF SYSTEMS (ROS)  ROS: Notable for the  following relevant positive findings: Review of Systems  Psychiatric/Behavioral:  Positive for depression, substance abuse and suicidal ideas. Negative for hallucinations.     Additional findings:      Musculoskeletal: No abnormal movements observed      Gait & Station: Laying/Sitting      Pain Screening: Denies      Nutrition & Dental Concerns: None disclosed at this time  RISK FORMULATION/ASSESSMENT  Is the patient experiencing any suicidal or homicidal ideations: Yes       Explain if yes: SI with plan to overdose on Tylenol   Protective factors considered for safety management: access to care, willingness to seek treatment   Risk factors/concerns  considered for safety management:  Prior attempt Depression Substance abuse/dependence Hopelessness Impulsivity Unmarried  Is there a safety management plan with the patient and treatment team to minimize risk factors and promote protective factors: Yes           Explain: currently in the ED, medication management, safety measures  Is crisis care placement or psychiatric hospitalization recommended: Yes     Based on my current evaluation and risk assessment, patient is determined at this time to be at:  High risk  *RISK ASSESSMENT Risk assessment is a dynamic process; it is possible that this patient's condition, and risk level, may change. This should be re-evaluated and managed over time as appropriate. Please re-consult psychiatric consult services if additional assistance is needed in terms of risk assessment and management. If your team decides to discharge this patient, please advise the patient how to best access emergency psychiatric services, or to call 911, if their condition worsens or they feel unsafe in any way.   Gailya Tauer E Cindy Fullman, NP Telepsychiatry Consult Services

## 2024-02-29 NOTE — ED Notes (Signed)
 Breakfast tray and beverage given to pt

## 2024-02-29 NOTE — ED Notes (Signed)
 Provided patient with menstrual pad per request.

## 2024-02-29 NOTE — BH Assessment (Signed)
 IT ticket submitted due to 2 telecarts not working. [facility] consult to be rescheduled.

## 2024-02-29 NOTE — ED Notes (Signed)
 Patient requested nicotine  patch and Message sent to provider. See new order's.

## 2024-02-29 NOTE — ED Notes (Signed)
 Pt provided with lunch tray. Pt sitting up eating.

## 2024-02-29 NOTE — ED Notes (Signed)
 Pt refused PM snack and drink.

## 2024-03-01 LAB — RESP PANEL BY RT-PCR (RSV, FLU A&B, COVID)  RVPGX2
Influenza A by PCR: NEGATIVE
Influenza B by PCR: NEGATIVE
Resp Syncytial Virus by PCR: NEGATIVE
SARS Coronavirus 2 by RT PCR: NEGATIVE

## 2024-03-01 MED ORDER — NICOTINE 7 MG/24HR TD PT24
7.0000 mg | MEDICATED_PATCH | Freq: Once | TRANSDERMAL | Status: DC
  Administered 2024-03-01: 7 mg via TRANSDERMAL
  Filled 2024-03-01: qty 1

## 2024-03-01 NOTE — ED Provider Notes (Signed)
 Emergency Medicine Observation Re-evaluation Note  Katrina Page is a 43 y.o. female, seen on rounds today.  Pt initially presented to the ED for complaints of Suicidal Currently, the patient is resting.  Physical Exam  BP 139/85 (BP Location: Left Arm)   Pulse 65   Temp 98.8 F (37.1 C) (Oral)   Resp 18   Ht 5\' 9"  (1.753 m)   Wt 113.4 kg   LMP 02/24/2024 (Exact Date)   SpO2 95%   BMI 36.92 kg/m  Physical Exam .Gen:  No acute distress Resp:  Breathing easily and comfortably, no accessory muscle usage Neuro:  Moving all four extremities, no gross focal neuro deficits Psych:  Resting currently, calm when awake   ED Course / MDM  EKG:   I have reviewed the labs performed to date as well as medications administered while in observation.  Recent changes in the last 24 hours include no acute events.  Plan  Current plan is for psyc dispo.    Shane Darling, MD 03/01/24 [phone]

## 2024-03-01 NOTE — BH Assessment (Addendum)
 Patient has been accepted to [facility] on 03/02/24 pending Covid test.  Patient was not assigned to a room.  Accepting physician is Dr. Elva Hamburger.  Call report to [phone].  Representative was [employer].   ER Staff is aware of it:  Edwina Gram, ER Secretary  Dr. Drenda Gentle, ER MD  Alston Jerry, Patient's Nurse     Address: [facility]                 [address]      [place], [place] [address]

## 2024-03-01 NOTE — Progress Notes (Signed)
 Pt is currently under review for placement.  Pt was referred to the following facilities:   Service Provider Phone Fax  [facility] [phone]  [facility] [phone]  [facility] [phone]  [facility] [phone]  [facility] [phone]  [facility] [phone]  [facility] [phone]  [place] [facility] [phone]  [facility] [phone]  [facility] [phone]  [facility] [phone]  [facility] [phone], [facility] [phone]

## 2024-03-01 NOTE — ED Notes (Signed)

## 2024-03-01 NOTE — ED Notes (Signed)
 Vol /patient meets criteral for inpatient psych  admission

## 2024-03-01 NOTE — ED Notes (Signed)
 EDP Dr. Cam Cava requesting psych reassessment due to patient wanting to leave.

## 2024-03-01 NOTE — ED Notes (Signed)
 During nursing assessment Katrina Page was A/Ox 4 .  She  stated that she is not currently have thoughts or feelings of SI/HI.  Katrina Page reported that she is not currently having auditory or visual hallucinations.  Pt affect is congruent with situation , eye contact is good , speech is of normal rate and volume  with appropriate verbiage noted.  Staff addressed any feelings or concerns that have been brought up.  Medications were administered as ordered. Pt requested to speak with on-call psych provider as "I was drinking to much and was intoxicated when I spoke to them before, I do have problems with my depression but I can take care of that with my outside provider.  I have responsibilities with my job and life. I need to be able to leave."  Staff assured pt that her concerns would be passed to on-call provider

## 2024-03-01 NOTE — ED Notes (Signed)
 Hospital meal provided, pt tolerated w/o complaints.  Waste discarded appropriately.

## 2024-03-02 NOTE — ED Notes (Addendum)
 No issues during shift. Patient states she is wanting to go home and feels like she does not needs psych admission.  Patient is willing wait and do another psych assessment. Katrina Page has been notified.

## 2024-03-02 NOTE — ED Notes (Signed)
 Hospital meal provided, pt tolerated w/o complaints.  Waste discarded appropriately.

## 2024-03-02 NOTE — ED Notes (Signed)
 Spoke to pt's partner & adult children. They verified there are no weapons, they feel safe with her home.  Pt given material for mental health at [facility] for f/u as well as additional information from EDP for PCP

## 2024-03-02 NOTE — ED Provider Notes (Addendum)
 Emergency Medicine Observation Re-evaluation Note  Katrina Page is a 43 y.o. female, seen on rounds today.  Pt initially presented to the ED for complaints of Suicidal  Currently, the patient is is no acute distress. Denies any concerns at this time.  Physical Exam  Blood pressure (!) 149/81, pulse 66, temperature 99.1 F (37.3 C), temperature source Oral, resp. rate 16, height 5' 9 (1.753 m), weight 113.4 kg, last menstrual period 02/24/2024, SpO2 96%.  Physical Exam: General: No apparent distress Pulm: Normal WOB Neuro: Moving all extremities Psych: Resting comfortably.  Denies SI    ED Course / MDM     I have reviewed the labs performed to date as well as medications administered while in observation.  Recent changes in the last 24 hours include: No acute events overnight.  Plan for psychiatry reevaluation  Patient does not meet criteria for involuntary commitment.  No longer wants to go voluntarily to inpatient psychiatry.  States she is no longer intoxicated and wants to go home and go to work tomorrow.  States that she lives with her partner and adult children and that she does not have any Pyrtle weapons at home.  She is wanting [facility] with [facility].   Plan   Current plan: Patient awaiting psychiatric disposition. Patient is not under full IVC at this time.    Viviano Ground, MD 03/02/24 1610    Viviano Ground, MD 03/02/24 [phone]

## 2024-03-02 NOTE — ED Notes (Signed)
 Pt is A/Ox 4, Katrina Page declines any SI/HI stated that she does not have any A/V hallucinations.  Discharge instructions reviewed with Patient and S/O, both verbalized understanding.  All Belongings accounted for and returned to PT.  Pt left ambulatory with S/O via POV
